# Patient Record
Sex: Female | Born: 1940 | Race: Black or African American | Hispanic: No | Smoking: Never smoker
Health system: Southern US, Community
[De-identification: ages and names within clinical notes are randomized; demographics above are authoritative.]

## PROBLEM LIST (undated history)

## (undated) ENCOUNTER — Emergency Department (HOSPITAL_COMMUNITY): Admission: EM | Discharge status: Absent without leave | Payer: Medicare Other

## (undated) DIAGNOSIS — G629 Polyneuropathy, unspecified: Secondary | ICD-10-CM

## (undated) DIAGNOSIS — K5900 Constipation, unspecified: Secondary | ICD-10-CM

## (undated) DIAGNOSIS — E119 Type 2 diabetes mellitus without complications: Secondary | ICD-10-CM

## (undated) DIAGNOSIS — I639 Cerebral infarction, unspecified: Secondary | ICD-10-CM

## (undated) DIAGNOSIS — K922 Gastrointestinal hemorrhage, unspecified: Secondary | ICD-10-CM

## (undated) DIAGNOSIS — K625 Hemorrhage of anus and rectum: Secondary | ICD-10-CM

## (undated) DIAGNOSIS — E7800 Pure hypercholesterolemia, unspecified: Secondary | ICD-10-CM

## (undated) DIAGNOSIS — K635 Polyp of colon: Secondary | ICD-10-CM

## (undated) DIAGNOSIS — K649 Unspecified hemorrhoids: Secondary | ICD-10-CM

## (undated) DIAGNOSIS — I10 Essential (primary) hypertension: Secondary | ICD-10-CM

## (undated) DIAGNOSIS — I739 Peripheral vascular disease, unspecified: Secondary | ICD-10-CM

## (undated) HISTORY — PX: ABDOMINAL HYSTERECTOMY: SHX81

## (undated) HISTORY — DX: Peripheral vascular disease, unspecified: I73.9

## (undated) HISTORY — DX: Polyneuropathy, unspecified: G62.9

## (undated) HISTORY — PX: COLONOSCOPY: SHX174

## (undated) HISTORY — DX: Polyp of colon: K63.5

---

## 2015-05-18 HISTORY — PX: ESOPHAGOGASTRODUODENOSCOPY: SHX1529

## 2016-05-29 ENCOUNTER — Encounter (HOSPITAL_COMMUNITY): Payer: Self-pay | Admitting: Emergency Medicine

## 2016-05-29 ENCOUNTER — Emergency Department (HOSPITAL_COMMUNITY)
Admission: EM | Admit: 2016-05-29 | Discharge: 2016-05-29 | Disposition: A | Discharge status: Home / Self Care | Payer: Medicare Other | Attending: Emergency Medicine | Admitting: Emergency Medicine

## 2016-05-29 ENCOUNTER — Emergency Department (HOSPITAL_COMMUNITY): Payer: Medicare Other

## 2016-05-29 DIAGNOSIS — Z79899 Other long term (current) drug therapy: Secondary | ICD-10-CM | POA: Insufficient documentation

## 2016-05-29 DIAGNOSIS — Z7982 Long term (current) use of aspirin: Secondary | ICD-10-CM | POA: Insufficient documentation

## 2016-05-29 DIAGNOSIS — E119 Type 2 diabetes mellitus without complications: Secondary | ICD-10-CM | POA: Insufficient documentation

## 2016-05-29 DIAGNOSIS — I10 Essential (primary) hypertension: Secondary | ICD-10-CM | POA: Insufficient documentation

## 2016-05-29 DIAGNOSIS — N390 Urinary tract infection, site not specified: Secondary | ICD-10-CM | POA: Diagnosis not present

## 2016-05-29 DIAGNOSIS — K625 Hemorrhage of anus and rectum: Secondary | ICD-10-CM

## 2016-05-29 DIAGNOSIS — N200 Calculus of kidney: Secondary | ICD-10-CM | POA: Insufficient documentation

## 2016-05-29 HISTORY — DX: Essential (primary) hypertension: I10

## 2016-05-29 HISTORY — DX: Gastrointestinal hemorrhage, unspecified: K92.2

## 2016-05-29 HISTORY — DX: Type 2 diabetes mellitus without complications: E11.9

## 2016-05-29 HISTORY — DX: Pure hypercholesterolemia, unspecified: E78.00

## 2016-05-29 LAB — CBC WITH DIFFERENTIAL/PLATELET
BASOS PCT: 0 %
Basophils Absolute: 0 10*3/uL (ref 0.0–0.1)
Basophils Absolute: 0 10*3/uL (ref 0.0–0.1)
Basophils Relative: 0 %
EOS ABS: 0.2 10*3/uL (ref 0.0–0.7)
EOS PCT: 2 %
Eosinophils Absolute: 0.2 10*3/uL (ref 0.0–0.7)
Eosinophils Relative: 2 %
HCT: 45.9 % (ref 36.0–46.0)
HEMATOCRIT: 46.9 % — AB (ref 36.0–46.0)
Hemoglobin: 14.8 g/dL (ref 12.0–15.0)
Hemoglobin: 15.3 g/dL — ABNORMAL HIGH (ref 12.0–15.0)
LYMPHS ABS: 2.5 10*3/uL (ref 0.7–4.0)
Lymphocytes Relative: 33 %
Lymphocytes Relative: 25 %
Lymphs Abs: 3.1 10*3/uL (ref 0.7–4.0)
MCH: 27.4 pg (ref 26.0–34.0)
MCH: 28.7 pg (ref 26.0–34.0)
MCHC: 31.6 g/dL (ref 30.0–36.0)
MCHC: 33.3 g/dL (ref 30.0–36.0)
MCV: 86.7 fL (ref 78.0–100.0)
MCV: 86.1 fL (ref 78.0–100.0)
MONO ABS: 1 10*3/uL (ref 0.1–1.0)
MONOS PCT: 11 %
Monocytes Absolute: 1.1 10*3/uL — ABNORMAL HIGH (ref 0.1–1.0)
Monocytes Relative: 11 %
NEUTROS ABS: 5 10*3/uL (ref 1.7–7.7)
Neutro Abs: 6.5 10*3/uL (ref 1.7–7.7)
Neutrophils Relative %: 54 %
Neutrophils Relative %: 62 %
PLATELETS: 257 10*3/uL (ref 150–400)
Platelets: 272 10*3/uL (ref 150–400)
RBC: 5.41 MIL/uL — ABNORMAL HIGH (ref 3.87–5.11)
RBC: 5.33 MIL/uL — AB (ref 3.87–5.11)
RDW: 14.5 % (ref 11.5–15.5)
RDW: 14.5 % (ref 11.5–15.5)
WBC: 9.3 10*3/uL (ref 4.0–10.5)
WBC: 10.3 10*3/uL (ref 4.0–10.5)

## 2016-05-29 LAB — URINALYSIS, ROUTINE W REFLEX MICROSCOPIC
BILIRUBIN URINE: NEGATIVE
GLUCOSE, UA: NEGATIVE mg/dL
HGB URINE DIPSTICK: NEGATIVE
Ketones, ur: NEGATIVE mg/dL
NITRITE: NEGATIVE
PROTEIN: 100 mg/dL — AB
Specific Gravity, Urine: 1.021 (ref 1.005–1.030)
pH: 5 (ref 5.0–8.0)

## 2016-05-29 LAB — DIFFERENTIAL
BASOS ABS: 0 10*3/uL (ref 0.0–0.1)
Basophils Relative: 0 %
EOS ABS: 0.1 10*3/uL (ref 0.0–0.7)
EOS PCT: 1 %
LYMPHS ABS: 2 10*3/uL (ref 0.7–4.0)
Lymphocytes Relative: 20 %
MONO ABS: 1 10*3/uL (ref 0.1–1.0)
MONOS PCT: 10 %
Neutro Abs: 6.6 10*3/uL (ref 1.7–7.7)
Neutrophils Relative %: 69 %

## 2016-05-29 LAB — COMPREHENSIVE METABOLIC PANEL
ALT: 22 U/L (ref 14–54)
AST: 29 U/L (ref 15–41)
Albumin: 3.8 g/dL (ref 3.5–5.0)
Alkaline Phosphatase: 85 U/L (ref 38–126)
Anion gap: 8 (ref 5–15)
BUN: 14 mg/dL (ref 6–20)
CHLORIDE: 101 mmol/L (ref 101–111)
CO2: 33 mmol/L — AB (ref 22–32)
Calcium: 9 mg/dL (ref 8.9–10.3)
Creatinine, Ser: 0.93 mg/dL (ref 0.44–1.00)
GFR, EST NON AFRICAN AMERICAN: 58 mL/min — AB (ref >60)
Glucose, Bld: 169 mg/dL — ABNORMAL HIGH (ref 65–99)
POTASSIUM: 3.6 mmol/L (ref 3.5–5.1)
SODIUM: 142 mmol/L (ref 135–145)
Total Bilirubin: 0.6 mg/dL (ref 0.3–1.2)
Total Protein: 7.8 g/dL (ref 6.5–8.1)

## 2016-05-29 LAB — CBC
HEMATOCRIT: 47.4 % — AB (ref 36.0–46.0)
Hemoglobin: 15.8 g/dL — ABNORMAL HIGH (ref 12.0–15.0)
MCH: 28.7 pg (ref 26.0–34.0)
MCHC: 33.3 g/dL (ref 30.0–36.0)
MCV: 86.2 fL (ref 78.0–100.0)
Platelets: 263 10*3/uL (ref 150–400)
RBC: 5.5 MIL/uL — ABNORMAL HIGH (ref 3.87–5.11)
RDW: 14.4 % (ref 11.5–15.5)
WBC: 9.9 10*3/uL (ref 4.0–10.5)

## 2016-05-29 LAB — LACTIC ACID, PLASMA
Lactic Acid, Venous: 0.8 mmol/L (ref 0.5–1.9)
Lactic Acid, Venous: 1 mmol/L (ref 0.5–1.9)

## 2016-05-29 LAB — POC OCCULT BLOOD, ED: Fecal Occult Bld: POSITIVE — AB

## 2016-05-29 MED ORDER — DEXTROSE 5 % IV SOLN
1.0000 g | Freq: Once | INTRAVENOUS | Status: AC
  Administered 2016-05-29: 1 g via INTRAVENOUS
  Filled 2016-05-29: qty 10

## 2016-05-29 MED ORDER — CEPHALEXIN 500 MG PO CAPS: 500.0000 mg | ORAL_CAPSULE | Freq: Four times a day (QID) | ORAL | 0 refills | Status: DC

## 2016-05-29 MED ORDER — IOPAMIDOL (ISOVUE-300) INJECTION 61%
100.0000 mL | Freq: Once | INTRAVENOUS | Status: AC | PRN
  Administered 2016-05-29: 100 mL via INTRAVENOUS

## 2016-05-29 NOTE — Progress Notes (Signed)
Patient ID: Sandra [name], female   DOB: Aug 10, 1940, 76 y.o.   MRN: [id number]   DISCUSSED WITH DR. Thurnell Garbe. ADMIT PT FOR UGIB. PLAN FOR EGD JAN 14. WILL OBTAIN OUTSIDE RECORDS. NPO AFTER MN EXCEPT SIPS WITH MEDS.

## 2016-05-29 NOTE — ED Provider Notes (Signed)
[facility] DEPT Provider Note   CSN: [id number] Arrival date & time: 05/29/16  0936     History   Chief Complaint Chief Complaint  Patient presents with  . Rectal Bleeding    HPI Sandra [name] is a 76 y.o. female.  HPI  Pt was seen at 1225. Per pt, c/o gradual onset and persistence of constant acute flair of her chronic rectal bleeding for the past 2 days. Pt states her "stools are black" and see "sees red blood" in the toilet after having a BM for the past 2 days. Pt states she has several year hx of same, been evaluated at [facility] ED and by GI Dr. Posey Pronto, "but they can't tell me what it is." Denies CP/SOB, no fevers, no rash, no back pain, no abd pain, no N/V.   Past Medical History:  Diagnosis Date  . Diabetes mellitus without complication ([place])   . GI bleed   . High cholesterol   . Hypertension     There are no active problems to display for this patient.   Past Surgical History:  Procedure Laterality Date  . ABDOMINAL HYSTERECTOMY      OB History    No data available       Home Medications    Prior to Admission medications   Medication Sig Start Date End Date Taking? Authorizing Provider  acetaminophen (TYLENOL) 650 MG CR tablet Take 650 mg by mouth every 8 (eight) hours as needed for pain.   Yes Historical Provider, MD  Ascorbic Acid (VITAMIN C) 1000 MG tablet Take 1,000 mg by mouth daily.   Yes Historical Provider, MD  aspirin EC 81 MG tablet Take 81 mg by mouth daily.   Yes Historical Provider, MD  esomeprazole (NEXIUM) 40 MG capsule Take 40 mg by mouth daily at 12 noon.   Yes Historical Provider, MD  LANTUS 100 UNIT/ML injection Inject 40 Units into the skin at bedtime. 03/10/16  Yes Historical Provider, MD  losartan (COZAAR) 100 MG tablet Take 1 tablet by mouth daily. 04/03/16  Yes Historical Provider, MD  metFORMIN (GLUCOPHAGE) 500 MG tablet Take 1 tablet by mouth 2 (two) times daily. 05/23/16  Yes Historical Provider, MD  Misc Natural Products  (DAILY HERBS LEG VEIN/CIRCULATI PO) Take 1 capsule by mouth daily.   Yes Historical Provider, MD  Multiple Vitamins-Minerals (ONE-A-DAY 50 PLUS PO) Take 1 tablet by mouth daily.   Yes Historical Provider, MD  Omega-3 Fatty Acids (FISH OIL) 1200 MG CAPS Take 1 capsule by mouth daily.   Yes Historical Provider, MD  simvastatin (ZOCOR) 20 MG tablet Take 1 tablet by mouth every evening. 05/03/16  Yes Historical Provider, MD  TOPROL XL 50 MG 24 hr tablet Take 1 tablet by mouth daily. 05/16/16  Yes Historical Provider, MD  vitamin B-12 (CYANOCOBALAMIN) 1000 MCG tablet Take 1,000 mcg by mouth daily.   Yes Historical Provider, MD    Family History History reviewed. No pertinent family history.  Social History Social History  Substance Use Topics  . Smoking status: Never Smoker  . Smokeless tobacco: Never Used  . Alcohol use No     Allergies   Valium [diazepam]   Review of Systems Review of Systems ROS: Statement: All systems negative except as marked or noted in the HPI; Constitutional: Negative for fever and chills. ; ; Eyes: Negative for eye pain, redness and discharge. ; ; ENMT: Negative for ear pain, hoarseness, nasal congestion, sinus pressure and sore throat. ; ; Cardiovascular: Negative for chest  pain, palpitations, diaphoresis, dyspnea and peripheral edema. ; ; Respiratory: Negative for cough, wheezing and stridor. ; ; Gastrointestinal: Negative for nausea, vomiting, diarrhea, abdominal pain, hematemesis, jaundice and +black stools, +rectal bleeding. . ; ; Genitourinary: Negative for dysuria, flank pain and hematuria. ; ; Musculoskeletal: Negative for back pain and neck pain. Negative for swelling and trauma.; ; Skin: Negative for pruritus, rash, abrasions, blisters, bruising and skin lesion.; ; Neuro: Negative for headache, lightheadedness and neck stiffness. Negative for weakness, altered level of consciousness, altered mental status, extremity weakness, paresthesias, involuntary  movement, seizure and syncope.       Physical Exam Updated Vital Signs BP 183/80 (BP Location: Left Arm)   Pulse 72   Temp 98.8 F (37.1 C) (Oral)   Resp 20   Ht 5\' 4"  (1.626 m)   Wt 217 lb (98.4 kg)   SpO2 94%   BMI 37.25 kg/m   16:59 Orthostatic Vital Signs DF  Orthostatic Lying   BP- Lying:  160/91  Pulse- Lying: 71      Orthostatic Sitting  BP- Sitting:  162/101  Pulse- Sitting: 74      Orthostatic Standing at 0 minutes  BP- Standing at 0 minutes: 147/69  Pulse- Standing at 0 minutes: 76     Physical Exam 1230: Physical examination:  Nursing notes reviewed; Vital signs and O2 SAT reviewed;  Constitutional: Well developed, Well nourished, Well hydrated, In no acute distress; Head:  Normocephalic, atraumatic; Eyes: EOMI, PERRL, No scleral icterus; ENMT: Mouth and pharynx normal, Mucous membranes moist; Neck: Supple, Full range of motion, No lymphadenopathy; Cardiovascular: Regular rate and rhythm, No gallop; Respiratory: Breath sounds clear & equal bilaterally, No wheezes.  Speaking full sentences with ease, Normal respiratory effort/excursion; Chest: Nontender, Movement normal; Abdomen: Soft, Nontender, Nondistended, Normal bowel sounds. Rectal exam performed w/permission of pt and ED RN chaperone present.  Anal tone normal.  Non-tender, soft black stool in rectal vault, heme positive.  No fissures, +external hemorrhoids without obvious active bleeding. No palp masses..;;; Genitourinary: No CVA tenderness; Extremities: Pulses normal, No tenderness, No edema, No calf edema or asymmetry.; Neuro: AA&Ox3, Major CN grossly intact.  Speech clear. No gross focal motor or sensory deficits in extremities.; Skin: Color normal, Warm, Dry.   ED Treatments / Results  Labs (all labs ordered are listed, but only abnormal results are displayed)   EKG  EKG Interpretation None       Radiology   Procedures Procedures (including critical care time)  Medications Ordered in  ED Medications - No data to display   Initial Impression / Assessment and Plan / ED Course  I have reviewed the triage vital signs and the nursing notes.  Pertinent labs & imaging results that were available during my care of the patient were reviewed by me and considered in my medical decision making (see chart for details).  MDM Reviewed: previous chart, nursing note and vitals Reviewed previous: labs Interpretation: labs and CT scan   Results for orders placed or performed during the hospital encounter of 05/29/16  Comprehensive metabolic panel  Result Value Ref Range   Sodium 142 135 - 145 mmol/L   Potassium 3.6 3.5 - 5.1 mmol/L   Chloride 101 101 - 111 mmol/L   CO2 33 (H) 22 - 32 mmol/L   Glucose, Bld 169 (H) 65 - 99 mg/dL   BUN 14 6 - 20 mg/dL   Creatinine, Ser 0.93 0.44 - 1.00 mg/dL   Calcium 9.0 8.9 - 10.3 mg/dL  Total Protein 7.8 6.5 - 8.1 g/dL   Albumin 3.8 3.5 - 5.0 g/dL   AST 29 15 - 41 U/L   ALT 22 14 - 54 U/L   Alkaline Phosphatase 85 38 - 126 U/L   Total Bilirubin 0.6 0.3 - 1.2 mg/dL   GFR calc non Af Amer 58 (L) >60 mL/min   GFR calc Af Amer >60 >60 mL/min   Anion gap 8 5 - 15  CBC  Result Value Ref Range   WBC 9.9 4.0 - 10.5 K/uL   RBC 5.50 (H) 3.87 - 5.11 MIL/uL   Hemoglobin 15.8 (H) 12.0 - 15.0 g/dL   HCT 47.4 (H) 36.0 - 46.0 %   MCV 86.2 78.0 - 100.0 fL   MCH 28.7 26.0 - 34.0 pg   MCHC 33.3 30.0 - 36.0 g/dL   RDW 14.4 11.5 - 15.5 %   Platelets 263 150 - 400 K/uL  Urinalysis, Routine w reflex microscopic  Result Value Ref Range   Color, Urine AMBER (A) YELLOW   APPearance CLOUDY (A) CLEAR   Specific Gravity, Urine 1.021 1.005 - 1.030   pH 5.0 5.0 - 8.0   Glucose, UA NEGATIVE NEGATIVE mg/dL   Hgb urine dipstick NEGATIVE NEGATIVE   Bilirubin Urine NEGATIVE NEGATIVE   Ketones, ur NEGATIVE NEGATIVE mg/dL   Protein, ur 100 (A) NEGATIVE mg/dL   Nitrite NEGATIVE NEGATIVE   Leukocytes, UA SMALL (A) NEGATIVE   RBC / HPF 6-30 0 - 5 RBC/hpf   WBC,  UA TOO NUMEROUS TO COUNT 0 - 5 WBC/hpf   Bacteria, UA MANY (A) NONE SEEN   Mucous PRESENT    Budding Yeast PRESENT    Hyaline Casts, UA PRESENT   Lactic acid, plasma  Result Value Ref Range   Lactic Acid, Venous 0.8 0.5 - 1.9 mmol/L  Lactic acid, plasma  Result Value Ref Range   Lactic Acid, Venous 1.0 0.5 - 1.9 mmol/L  Differential  Result Value Ref Range   Neutrophils Relative % 69 %   Neutro Abs 6.6 1.7 - 7.7 K/uL   Lymphocytes Relative 20 %   Lymphs Abs 2.0 0.7 - 4.0 K/uL   Monocytes Relative 10 %   Monocytes Absolute 1.0 0.1 - 1.0 K/uL   Eosinophils Relative 1 %   Eosinophils Absolute 0.1 0.0 - 0.7 K/uL   Basophils Relative 0 %   Basophils Absolute 0.0 0.0 - 0.1 K/uL  CBC with Differential  Result Value Ref Range   WBC 9.3 4.0 - 10.5 K/uL   RBC 5.41 (H) 3.87 - 5.11 MIL/uL   Hemoglobin 14.8 12.0 - 15.0 g/dL   HCT 46.9 (H) 36.0 - 46.0 %   MCV 86.7 78.0 - 100.0 fL   MCH 27.4 26.0 - 34.0 pg   MCHC 31.6 30.0 - 36.0 g/dL   RDW 14.5 11.5 - 15.5 %   Platelets 272 150 - 400 K/uL   Neutrophils Relative % 54 %   Neutro Abs 5.0 1.7 - 7.7 K/uL   Lymphocytes Relative 33 %   Lymphs Abs 3.1 0.7 - 4.0 K/uL   Monocytes Relative 11 %   Monocytes Absolute 1.0 0.1 - 1.0 K/uL   Eosinophils Relative 2 %   Eosinophils Absolute 0.2 0.0 - 0.7 K/uL   Basophils Relative 0 %   Basophils Absolute 0.0 0.0 - 0.1 K/uL  CBC with Differential  Result Value Ref Range   WBC 10.3 4.0 - 10.5 K/uL   RBC 5.33 (H) 3.87 - 5.11  MIL/uL   Hemoglobin 15.3 (H) 12.0 - 15.0 g/dL   HCT 45.9 36.0 - 46.0 %   MCV 86.1 78.0 - 100.0 fL   MCH 28.7 26.0 - 34.0 pg   MCHC 33.3 30.0 - 36.0 g/dL   RDW 14.5 11.5 - 15.5 %   Platelets 257 150 - 400 K/uL   Neutrophils Relative % 62 %   Neutro Abs 6.5 1.7 - 7.7 K/uL   Lymphocytes Relative 25 %   Lymphs Abs 2.5 0.7 - 4.0 K/uL   Monocytes Relative 11 %   Monocytes Absolute 1.1 (H) 0.1 - 1.0 K/uL   Eosinophils Relative 2 %   Eosinophils Absolute 0.2 0.0 - 0.7 K/uL    Basophils Relative 0 %   Basophils Absolute 0.0 0.0 - 0.1 K/uL  POC occult blood, ED  Result Value Ref Range   Fecal Occult Bld POSITIVE (A) NEGATIVE   Ct Abdomen Pelvis W Contrast Result Date: 05/29/2016 CLINICAL DATA:  Bright and dark red stools since yesterday. Hx of diabetes, HTN,GI bleed, hysterectomy EXAM: CT ABDOMEN AND PELVIS WITH CONTRAST TECHNIQUE: Multidetector CT imaging of the abdomen and pelvis was performed using the standard protocol following bolus administration of intravenous contrast. CONTRAST:  147mL ISOVUE-300 IOPAMIDOL (ISOVUE-300) INJECTION 61% COMPARISON:  None. FINDINGS: Lower chest: Lung bases are essentially clear. Heart is mildly enlarged. Hepatobiliary: No focal liver abnormality is seen. No gallstones, gallbladder wall thickening, or biliary dilatation. Pancreas: Unremarkable. No pancreatic ductal dilatation or surrounding inflammatory changes. Spleen: Normal in size without focal abnormality. Adrenals/Urinary Tract: No adrenal masses. 5.3 cm midpole left renal cyst. No other renal masses. Right renal scarring. Small calcification in the upper pole the right kidney. 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 Ureters normal in course and caliber. No ureteral stones. No hydronephrosis. Bladder is unremarkable. Stomach/Bowel: Stomach is within normal limits. Appendix appears normal. No evidence of bowel wall thickening, distention, or inflammatory changes. Vascular/Lymphatic: Aortic atherosclerosis. No enlarged abdominal or pelvic lymph nodes. Reproductive: Status post hysterectomy. No adnexal masses. Other: No abdominal wall hernia or abnormality. No abdominopelvic ascites. Musculoskeletal: No fracture or acute finding. No osteoblastic or osteolytic lesions. IMPRESSION: 1. No acute findings.  No findings to account for GI bleeding. 2. Right renal scar. Nonobstructing stone in the right kidney. Left renal cyst. 3. Aortic atherosclerosis.  Electronically Signed   By: Lajean Manes M.D.   On: 05/29/2016 15:33    1820:  Denies symptoms during orthostatic VS. Pt states she continues to have black stools/rectal bleeding while in the ED, but H/H x2 and VS remain stable. Pt is currently eating. WBC count and lactic acid x2 are normal. +UTI, UC pending; will dose IV rocephin. T/C to GI Dr. Oneida Alar, case discussed, including:  HPI, pertinent PM/SHx, VS/PE, dx testing, ED course and treatment:  Given pt's stability, likely not significant GI bleed at this time. T/C to [facility] Dr. Lorin Mercy, case discussed, including:  HPI, pertinent PM/SHx, VS/PE, dx testing, ED course and treatment:  Pt very stable, no clear criteria to admit at this time. Will re-check H/H, likely d/c. Dx and testing, as well as d/w MD's, d/w pt and family.  Questions answered.  Verb understanding, agreeable with plan.   2025:  3rd H/H stable. BP and HR stable. Has tol PO well without N/V. Abd remains benign. Dx and testing d/w pt and family.  Questions answered.  Verb understanding, agreeable to d/c home with outpt f/u.   Final Clinical Impressions(s) / ED Diagnoses  Final diagnoses:  None    New Prescriptions New Prescriptions   No medications on file     Francine Graven, DO 06/01/16 1719

## 2016-05-29 NOTE — ED Triage Notes (Signed)
Pt reports rectal bleeding since yesterday.  Describes as bright and dark red.

## 2016-05-29 NOTE — Discharge Instructions (Signed)
Take the prescription as directed.  Call your regular medical doctor and GI doctor on Monday to schedule a follow up appointment within the next 2 days.  Return to the Emergency Department immediately sooner if worsening.

## 2016-05-29 NOTE — ED Notes (Signed)
Patient would like something to eat. MD made aware.

## 2016-05-31 ENCOUNTER — Telehealth: Payer: Self-pay | Admitting: Nurse Practitioner

## 2016-05-31 NOTE — Telephone Encounter (Signed)
Pt seen in the ER and was told to follow up with [place]. Can we accept her as a new patient?

## 2016-06-04 NOTE — Telephone Encounter (Signed)
Ok to schedule for routine OV for ER follow-up/GI bleed"

## 2016-06-07 ENCOUNTER — Encounter: Payer: Self-pay | Admitting: Internal Medicine

## 2016-06-07 NOTE — Telephone Encounter (Signed)
OV made and letter mailed °

## 2016-06-16 ENCOUNTER — Ambulatory Visit (INDEPENDENT_AMBULATORY_CARE_PROVIDER_SITE_OTHER): Payer: Medicare Other | Admitting: Nurse Practitioner

## 2016-06-16 ENCOUNTER — Encounter: Payer: Self-pay | Admitting: Nurse Practitioner

## 2016-06-16 DIAGNOSIS — K5900 Constipation, unspecified: Secondary | ICD-10-CM | POA: Diagnosis not present

## 2016-06-16 DIAGNOSIS — K649 Unspecified hemorrhoids: Secondary | ICD-10-CM

## 2016-06-16 DIAGNOSIS — K625 Hemorrhage of anus and rectum: Secondary | ICD-10-CM | POA: Diagnosis not present

## 2016-06-16 MED ORDER — HYDROCORTISONE 2.5 % RE CREA: 1.0000 "application " | TOPICAL_CREAM | Freq: Two times a day (BID) | RECTAL | 1 refills | Status: DC

## 2016-06-16 NOTE — Patient Instructions (Signed)
1. Hold off on taking MiraLAX for now. 2. Start taking Amitiza 8 g, twice a day. We will give give you samples to last 2 weeks. 3. Call us in 1-2 weeks and let us know how the medicine is working. 4. We will request her previous colonoscopy from Dr. Posey Pronto. 5. I sent in Anusol rectal cream to use for your hemorrhoids. Apply this twice a day for up to 10 days at a time for hemorrhoid symptoms. 6. Return for follow-up in 2 months.

## 2016-06-16 NOTE — Assessment & Plan Note (Signed)
Chronic constipation associated with chronic rectal bleeding and hemorrhoid symptoms. Hemorrhoid management as per above. I will start her on Amitiza 8 g twice a day on a full stomach. I will provide samples for 2 weeks. She is to call us in 1-2 weeks with a progress report. We can titrate constipation medications over the phone for optimal management. Return for follow-up in 2 months.

## 2016-06-16 NOTE — Assessment & Plan Note (Signed)
The patient has a history of hemorrhoids, both internal and external, with hemorrhoid symptoms including rectal pain, irritation, fullness. This is associated with her rectal bleeding associated with her constipation. I will send in Anusol rectal cream for her to apply twice a day for 10 days at a time. This should help symptomatic management of her hemorrhoids. Return for follow-up in 2 months.

## 2016-06-16 NOTE — Assessment & Plan Note (Signed)
The patient has noted chronic rectal bleeding associated with her hemorrhoids. States she just had a colonoscopy last year with Dr. Posey Pronto. We will request these records. CT of her abdomen in the emergency department was normal, vital signs are stable, hemoglobin was stable for 3 draws. At this point her rectal bleeding is likely due to hemorrhoid leading in the setting of chronic constipation. Hemorrhoid treatment as per above. Constipation management as per below. We will have her return in 2 months and depending on her previous colonoscopy and her clinical progression we may elect to proceed with further colonoscopy.

## 2016-06-16 NOTE — Progress Notes (Addendum)
REVIEWED-NO ADDITIONAL RECOMMENDATIONS.  Referring Provider: No ref. provider found Primary Care Physician:  Thea Alken Primary GI:  Dr. Oneida Alar  Chief Complaint  Patient presents with  . GI bleed    no rectal bleeding now; not feeling good  . Nausea  . Abdominal Pain    all over  . Constipation    HPI:   Sandra [name] is a 76 y.o. female who presents for evaluation/ER follow-up related to rectal bleeding and melena. The patient was seen in the emergency department 05/29/2016 complaining of gradual onset and persistence of acute flare of chronic rectal bleeding for the previous 2 days with also "stools are black "and sees "red blood" in the toilet after a bowel movement. Several year history of the same, previously evaluated in the emergency department at [facility] and by Dr. Posey Pronto but, per the patient, they have been unable to tell her what the problem is. CT of the abdomen and pelvis found no acute findings and no findings to account for GI bleeding. Her vital signs and hemoglobin remained stable and was deemed not a likely significant GI bleed. Heme stool test found to be positive in the ED. Her hemoglobin along the emergency department ranged from 14.8-15.8. Her CMP was essentially normal.  Today she states she's doing ok overall. Confirms chronic rectal bleeding with acute exacerbatio of such prior to ER evaluation. Had black stools but was taking Pepto Bismol at the time. Has constipated and takes miraLAX; once a day dosing typically works, taking twice a day (as recommended) is too much and she overshoots and has diarrhea. Today she states she has been having intermittent generalized abdominal pain described as crampy, typically occurs 2-3 days out of a week. Worsened with eating anything with seeds, raw vegetables, and nuts. Cramping improves significantly with a bowel movement. Has a bowel movement about every day to twice a day; notes sensation of incomplete  emptying. Has hemorrhoid symptoms including rectal fullness and irritation. Last colonoscopy was about a year ago with Dr. Posey Pronto in [place], [place]. Previously saw another GI in [place], [place] who retired. She had one polyp removed and is due next year. Denies chest pain, dyspnea, dizziness, lightheadedness, syncope, near syncope. Denies any other upper or lower GI symptoms.  Past Medical History:  Diagnosis Date  . Colon polyp   . Diabetes mellitus without complication ([place] [place])   . GI bleed   . High cholesterol   . Hypertension     Past Surgical History:  Procedure Laterality Date  . ABDOMINAL HYSTERECTOMY      Current Outpatient Prescriptions  Medication Sig Dispense Refill  . acetaminophen (TYLENOL) 650 MG CR tablet Take 650 mg by mouth every 8 (eight) hours as needed for pain.    . Ascorbic Acid (VITAMIN C) 1000 MG tablet Take 1,000 mg by mouth daily.    Marland Kitchen aspirin EC 81 MG tablet Take 81 mg by mouth daily.    . cephALEXin (KEFLEX) 500 MG capsule Take 1 capsule (500 mg total) by mouth 4 (four) times daily. 40 capsule 0  . ergocalciferol (DRISDOL) 8000 UNIT/ML drops Take by mouth daily. Takes 2 drops daily    . esomeprazole (NEXIUM) 40 MG capsule Take 40 mg by mouth as needed.     Marland Kitchen LANTUS 100 UNIT/ML injection Inject 40 Units into the skin at bedtime.    Marland Kitchen losartan (COZAAR) 100 MG tablet Take 1 tablet by mouth daily.    . Misc Natural Products (DAILY HERBS LEG  VEIN/CIRCULATI PO) Take 1 capsule by mouth daily.    . Multiple Vitamins-Minerals (ONE-A-DAY 50 PLUS PO) Take 1 tablet by mouth daily.    . Omega-3 Fatty Acids (FISH OIL) 1200 MG CAPS Take 1 capsule by mouth daily.    . simvastatin (ZOCOR) 20 MG tablet Take 1 tablet by mouth every evening.    . TOPROL XL 50 MG 24 hr tablet Take 1 tablet by mouth daily.    . vitamin B-12 (CYANOCOBALAMIN) 1000 MCG tablet Take 1,000 mcg by mouth daily.     No current facility-administered medications for this visit.     Allergies as of 06/16/2016 -  Review Complete 06/16/2016  Allergen Reaction Noted  . Morphine and related  06/16/2016  . Valium [diazepam] Rash 05/29/2016    Family History  Problem Relation Age of Onset  . Colon cancer Neg Hx     Social History   Social History  . Marital status: Widowed    Spouse name: N/A  . Number of children: N/A  . Years of education: N/A   Social History Main Topics  . Smoking status: Never Smoker  . Smokeless tobacco: Never Used  . Alcohol use No  . Drug use: No  . Sexual activity: Not Asked   Other Topics Concern  . None   Social History Narrative  . None    Review of Systems: Complete ROS negative except as per HPI.   Physical Exam: BP (!) 198/97   Pulse 73   Temp 98.4 F (36.9 C) (Oral)   Ht 5' 4.5" (1.638 m)   Wt 216 lb 3.2 oz (98.1 kg)   BMI 36.54 kg/m  General:   Alert and oriented. Pleasant and cooperative. Well-nourished and well-developed.  Head:  Normocephalic and atraumatic. Eyes:  Without icterus, sclera clear and conjunctiva pink.  Ears:  Normal auditory acuity. Cardiovascular:  S1, S2 present without murmurs appreciated. Extremities without clubbing or edema. Respiratory:  Clear to auscultation bilaterally. No wheezes, rales, or rhonchi. No distress.  Gastrointestinal:  +BS, rounded but soft, non-tender and non-distended. No HSM noted. No guarding or rebound. No masses appreciated.  Rectal:  Deferred  Musculoskalatal:  Symmetrical without gross deformities. Neurologic:  Alert and oriented x4;  grossly normal neurologically. Psych:  Alert and cooperative. Normal mood and affect. Heme/Lymph/Immune: No excessive bruising noted.    06/16/2016 4:58 PM   Disclaimer: This note was dictated with voice recognition software. Similar sounding words can inadvertently be transcribed and may not be corrected upon review. ew

## 2016-06-21 NOTE — Progress Notes (Signed)
CC'D TO PCP °

## 2016-08-12 ENCOUNTER — Ambulatory Visit (INDEPENDENT_AMBULATORY_CARE_PROVIDER_SITE_OTHER): Payer: Medicare Other | Admitting: Nurse Practitioner

## 2016-08-12 ENCOUNTER — Encounter: Payer: Self-pay | Admitting: Nurse Practitioner

## 2016-08-12 VITALS — BP 199/97 | HR 70 | Temp 98.6°F | Ht 64.5 in | Wt 215.6 lb

## 2016-08-12 DIAGNOSIS — K5900 Constipation, unspecified: Secondary | ICD-10-CM

## 2016-08-12 DIAGNOSIS — K649 Unspecified hemorrhoids: Secondary | ICD-10-CM

## 2016-08-12 DIAGNOSIS — I10 Essential (primary) hypertension: Secondary | ICD-10-CM | POA: Insufficient documentation

## 2016-08-12 NOTE — Progress Notes (Signed)
Referring Provider: Thea Alken, FNP Primary Care Physician:  Thea Alken Primary GI:  Dr. Oneida Alar  Chief Complaint  Patient presents with  . Rectal Bleeding    less bleeding  . Constipation  . Hemorrhoids  . Abdominal Pain    across lower abd    HPI:   Sandra [name] is a 76 y.o. female who presents For follow-up on rectal bleeding, hemorrhoids, constipation, abdominal pain. She was last seen in our office 06/16/2016. That point she is doing well overall and constipation typically controlled with once a day MiraLAX with taking twice a day "too much "with resulting diarrhea. Abdominal pain is crampy in 2-3 times a week and improved significantly with a bowel movement. Hemorrhoid symptoms and due for colonoscopy in 2019. Previously saw another GI practitioner in [place], [place] but this person has retired. Recommended Amitiza 8 mg twice a day with samples for 2 weeks and request a progress report. States she just had a colonoscopy in 2017 and records were requested. Hemorrhoids were treated with topical treatments. Recommended two-month follow-up.  Previously a CT of the abdomen and pelvis found no acute findings and no findings to account for GI bleeding.  Today she states she's doing ok overall. Feels Miralax worked better than Amitiza. Having a bowel movement twice a day, sometimes has sensation of incomplete emptying. States she lost her brother last week. Also her 56 year old niece was diagnosed with stage IV cancer (stomach, liver, and one other place). All of this has caused stress and thinks this is why her BP is up today. Has abdominal pain which improves after a bowel movement, but this is improved. Hemorrhoids are improved with Anusol. Has only had 1 episode of rectal bleeding since last visit. States her symptoms are all tolerable at this point.  Past Medical History:  Diagnosis Date  . Colon polyp   . Diabetes mellitus without complication ([place])   . GI bleed    . High cholesterol   . Hypertension   . PAD (peripheral artery disease) ([place])   . Peripheral neuropathy [facility])     Past Surgical History:  Procedure Laterality Date  . ABDOMINAL HYSTERECTOMY      Current Outpatient Prescriptions  Medication Sig Dispense Refill  . acetaminophen (TYLENOL) 650 MG CR tablet Take 650 mg by mouth every 8 (eight) hours as needed for pain.    . Ascorbic Acid (VITAMIN C) 1000 MG tablet Take 1,000 mg by mouth daily.    Marland Kitchen aspirin EC 81 MG tablet Take 81 mg by mouth daily.    . ergocalciferol (DRISDOL) 8000 UNIT/ML drops Take by mouth daily. Takes 2 drops daily    . esomeprazole (NEXIUM) 40 MG capsule Take 40 mg by mouth as needed.     . hydrocortisone (ANUSOL-HC) 2.5 % rectal cream Place 1 application rectally 2 (two) times daily. 30 g 1  . LANTUS 100 UNIT/ML injection Inject 44 Units into the skin at bedtime.     Marland Kitchen losartan (COZAAR) 100 MG tablet Take 1 tablet by mouth daily.    . Misc Natural Products (DAILY HERBS LEG VEIN/CIRCULATI PO) Take 1 capsule by mouth daily.    . Multiple Vitamins-Minerals (ONE-A-DAY 50 PLUS PO) Take 1 tablet by mouth daily.    . Omega-3 Fatty Acids (FISH OIL) 1200 MG CAPS Take 1 capsule by mouth daily.    . simvastatin (ZOCOR) 20 MG tablet Take 1 tablet by mouth every evening.    . TOPROL XL  50 MG 24 hr tablet Take 1 tablet by mouth daily.    . vitamin B-12 (CYANOCOBALAMIN) 1000 MCG tablet Take 1,000 mcg by mouth daily.     No current facility-administered medications for this visit.     Allergies as of 08/12/2016 - Review Complete 08/12/2016  Allergen Reaction Noted  . Morphine and related  06/16/2016  . Valium [diazepam] Rash 05/29/2016    Family History  Problem Relation Age of Onset  . Colon cancer Neg Hx     Social History   Social History  . Marital status: Widowed    Spouse name: N/A  . Number of children: N/A  . Years of education: N/A   Social History Main Topics  . Smoking status: Never Smoker  .  Smokeless tobacco: Never Used  . Alcohol use No  . Drug use: No  . Sexual activity: Not Asked   Other Topics Concern  . None   Social History Narrative  . None    Review of Systems: Complete ROS negative except as per HPI.   Physical Exam: BP (!) 228/89   Pulse 70   Temp 98.6 F (37 C) (Oral)   Ht 5' 4.5" (1.638 m)   Wt 215 lb 9.6 oz (97.8 kg)   BMI 36.44 kg/m  General:   Alert and oriented. Pleasant and cooperative. Well-nourished and well-developed.  Head:  Normocephalic and atraumatic. Eyes:  Without icterus, sclera clear and conjunctiva pink.  Ears:  Normal auditory acuity. Cardiovascular:  S1, S2 present without murmurs appreciated. Extremities without clubbing or edema. Respiratory:  Clear to auscultation bilaterally. No wheezes, rales, or rhonchi. No distress.  Gastrointestinal:  +BS, rounded but soft, non-tender and non-distended. No HSM noted. No guarding or rebound. No masses appreciated.  Rectal:  Deferred  Musculoskalatal:  Symmetrical without gross deformities. Neurologic:  Alert and oriented x4;  grossly normal neurologically. Psych:  Alert and cooperative. Normal mood and affect. Heme/Lymph/Immune: No excessive bruising noted.    08/12/2016 10:09 AM   Disclaimer: This note was dictated with voice recognition software. Similar sounding words can inadvertently be transcribed and may not be corrected upon review.

## 2016-08-12 NOTE — Patient Instructions (Signed)
1. If you develop symptoms like weakness, significant dizziness, chest pain, worsening shortness of breath then proceed to the ER 2. At the very least, call your primary care doctor [place] to tell them about your high blood pressure 3. Add a fiber supplement once a day (such as fiber gummies) 4. Return for follow-up in 6 months 5. Call if your symptoms become severe or significantly worsen before then.

## 2016-08-12 NOTE — Assessment & Plan Note (Signed)
Documented history of hypertension. Her blood pressure today is 228/89. She denies cardiac symptoms such as chest pain, dyspnea, dizziness, weakness. Eyes given her ER precautions. I will have nursing recheck her blood pressure before she leaves. If she develops any symptoms she should proceed to the emergency room. At the very least she should call her primary care provider when she leaves our office today to discuss her high blood pressure. She does not feel she needs to go to the emergency room at this time.

## 2016-08-12 NOTE — Assessment & Plan Note (Signed)
Hemorrhoids with rectal bleeding. Has only had 1 episode of toilet tissue hematochezia since her last visit. Previous colonoscopy records received, she is due for another colonoscopy next year. Continue using Anusol which she feels is helped her symptoms significantly. If any worsening symptoms, call us. Otherwise, return for follow-up in 6 months.

## 2016-08-12 NOTE — Assessment & Plan Note (Signed)
Constipation with associated abdominal pain. States she is having one to 2 bowel movements a day on MiraLAX. Feels MiraLAX works better than Amitiza did. Overall she is satisfied with her symptoms at this time. She does have some abdominal tenderness on exam and admits to such occasionally, persistently. I will have her add a fiber supplement once a day. Return for follow-up in 6 months. Call if any worsening symptoms.

## 2016-08-16 NOTE — Progress Notes (Signed)
CC'D TO PCP °

## 2016-08-30 NOTE — Progress Notes (Signed)
REVIEWED-NO ADDITIONAL RECOMMENDATIONS. 

## 2016-11-04 ENCOUNTER — Encounter (HOSPITAL_COMMUNITY): Payer: Self-pay | Admitting: Emergency Medicine

## 2016-11-04 ENCOUNTER — Emergency Department (HOSPITAL_COMMUNITY)
Admission: EM | Admit: 2016-11-04 | Discharge: 2016-11-04 | Disposition: A | Discharge status: Home / Self Care | Payer: Medicare Other | Attending: Emergency Medicine | Admitting: Emergency Medicine

## 2016-11-04 DIAGNOSIS — E119 Type 2 diabetes mellitus without complications: Secondary | ICD-10-CM | POA: Insufficient documentation

## 2016-11-04 DIAGNOSIS — K625 Hemorrhage of anus and rectum: Secondary | ICD-10-CM | POA: Diagnosis present

## 2016-11-04 DIAGNOSIS — I10 Essential (primary) hypertension: Secondary | ICD-10-CM | POA: Insufficient documentation

## 2016-11-04 DIAGNOSIS — Z79899 Other long term (current) drug therapy: Secondary | ICD-10-CM | POA: Diagnosis not present

## 2016-11-04 DIAGNOSIS — Z7984 Long term (current) use of oral hypoglycemic drugs: Secondary | ICD-10-CM | POA: Insufficient documentation

## 2016-11-04 LAB — CBC
HCT: 44.2 % (ref 36.0–46.0)
Hemoglobin: 14.6 g/dL (ref 12.0–15.0)
MCH: 28.2 pg (ref 26.0–34.0)
MCHC: 33 g/dL (ref 30.0–36.0)
MCV: 85.3 fL (ref 78.0–100.0)
PLATELETS: 252 10*3/uL (ref 150–400)
RBC: 5.18 MIL/uL — ABNORMAL HIGH (ref 3.87–5.11)
RDW: 14.3 % (ref 11.5–15.5)
WBC: 8.6 10*3/uL (ref 4.0–10.5)

## 2016-11-04 LAB — DIFFERENTIAL
Basophils Absolute: 0 10*3/uL (ref 0.0–0.1)
Basophils Relative: 0 %
EOS PCT: 2 %
Eosinophils Absolute: 0.2 10*3/uL (ref 0.0–0.7)
LYMPHS ABS: 2.2 10*3/uL (ref 0.7–4.0)
LYMPHS PCT: 26 %
MONO ABS: 0.8 10*3/uL (ref 0.1–1.0)
MONOS PCT: 9 %
NEUTROS ABS: 5.4 10*3/uL (ref 1.7–7.7)
Neutrophils Relative %: 63 %

## 2016-11-04 LAB — COMPREHENSIVE METABOLIC PANEL
ALK PHOS: 80 U/L (ref 38–126)
ALT: 15 U/L (ref 14–54)
AST: 23 U/L (ref 15–41)
Albumin: 3.6 g/dL (ref 3.5–5.0)
Anion gap: 8 (ref 5–15)
BUN: 16 mg/dL (ref 6–20)
CALCIUM: 8.9 mg/dL (ref 8.9–10.3)
CHLORIDE: 104 mmol/L (ref 101–111)
CO2: 32 mmol/L (ref 22–32)
CREATININE: 0.78 mg/dL (ref 0.44–1.00)
GFR calc non Af Amer: >60 mL/min (ref >60)
Glucose, Bld: 82 mg/dL (ref 65–99)
Potassium: 3.5 mmol/L (ref 3.5–5.1)
SODIUM: 144 mmol/L (ref 135–145)
Total Bilirubin: 0.6 mg/dL (ref 0.3–1.2)
Total Protein: 7.1 g/dL (ref 6.5–8.1)

## 2016-11-04 LAB — TYPE AND SCREEN
ABO/RH(D): O POS
Antibody Screen: NEGATIVE

## 2016-11-04 LAB — POC OCCULT BLOOD, ED: FECAL OCCULT BLD: POSITIVE — AB

## 2016-11-04 MED ORDER — HYDROCORTISONE 2.5 % RE CREA: TOPICAL_CREAM | RECTAL | 1 refills | Status: DC

## 2016-11-04 NOTE — Discharge Instructions (Signed)
Blood work was completely normal. Prescription for rectal cream. Follow-up your primary care doctor or gastroenterologist.

## 2016-11-04 NOTE — ED Provider Notes (Signed)
[facility] DEPT Provider Note   CSN: [id number] Arrival date & time: 11/04/16  0904     History   Chief Complaint Chief Complaint  Patient presents with  . Rectal Bleeding    HPI Sandra [name] is a 76 y.o. female.  Patient has had several minimal episodes of blood from her rectum for the past 2 days. This is been ongoing, for 7 years. He is been seen by gastroenterologist. Colonoscopy and CT abdomen/pelvis from the past year were normal. No fever, sweats, chills, vomiting.  Patient is been able to eat. Severity of symptoms is mild.      Past Medical History:  Diagnosis Date  . Colon polyp   . Diabetes mellitus without complication ([place])   . GI bleed   . High cholesterol   . Hypertension   . PAD (peripheral artery disease) ([place])   . Peripheral neuropathy     Patient Active Problem List   Diagnosis Date Noted  . Essential hypertension 08/12/2016  . Hemorrhoids 06/16/2016  . Rectal bleeding 06/16/2016  . Constipation 06/16/2016    Past Surgical History:  Procedure Laterality Date  . ABDOMINAL HYSTERECTOMY      OB History    No data available       Home Medications    Prior to Admission medications   Medication Sig Start Date End Date Taking? Authorizing Provider  acetaminophen (TYLENOL) 650 MG CR tablet Take 650 mg by mouth every 8 (eight) hours as needed for pain.   Yes [provider]  Ascorbic Acid (VITAMIN C) 1000 MG tablet Take 1,000 mg by mouth daily.   Yes [provider]  aspirin EC 81 MG tablet Take 81 mg by mouth daily.   Yes [provider]  ergocalciferol (DRISDOL) 8000 UNIT/ML drops Take by mouth daily. Takes 2 drops daily   Yes [provider]  esomeprazole (NEXIUM) 40 MG capsule Take 40 mg by mouth as needed.    Yes [provider]  LANTUS 100 UNIT/ML injection Inject 44 Units into the skin at bedtime.  03/10/16  Yes [provider]  losartan (COZAAR) 100 MG tablet Take 1 tablet by  mouth daily. 04/03/16  Yes [provider]  metFORMIN (GLUCOPHAGE) 500 MG tablet Take 1 tablet by mouth 2 (two) times daily after a meal. 09/28/16  Yes [provider]  Multiple Vitamins-Minerals (ONE-A-DAY 50 PLUS PO) Take 1 tablet by mouth daily.   Yes [provider]  Omega-3 Fatty Acids (FISH OIL) 1200 MG CAPS Take 1 capsule by mouth daily.   Yes [provider]  polyethylene glycol powder (GLYCOLAX/MIRALAX) powder Take 1 Container by mouth daily.   Yes [provider]  simvastatin (ZOCOR) 20 MG tablet Take 1 tablet by mouth every evening. 05/03/16  Yes [provider]  TOPROL XL 50 MG 24 hr tablet Take 1 tablet by mouth daily. 05/16/16  Yes [provider]  vitamin B-12 (CYANOCOBALAMIN) 1000 MCG tablet Take 1,000 mcg by mouth daily.   Yes [provider]  hydrocortisone (ANUSOL-HC) 2.5 % rectal cream Apply rectally 2 times daily 11/04/16   [name], MD    Family History Family History  Problem Relation Age of Onset  . Colon cancer Neg Hx     Social History Social History  Substance Use Topics  . Smoking status: Never Smoker  . Smokeless tobacco: Never Used  . Alcohol use No     Allergies   Morphine and related and Valium [diazepam]  Review of Systems Review of Systems  All other systems reviewed and are negative.    Physical Exam Updated Vital Signs BP 127/69   Pulse 66   Temp 98.2 F (36.8 C) (Oral)   Resp 19   Ht 5\' 4"  (1.626 m)   Wt 98.9 kg (218 lb)   SpO2 92%   BMI 37.42 kg/m   Physical Exam  Constitutional: She is oriented to person, place, and time. She appears well-developed and well-nourished.  Obese, no acute distress  HENT:  Head: Normocephalic and atraumatic.  Eyes: Conjunctivae are normal.  Neck: Neck supple.  Cardiovascular: Normal rate and regular rhythm.   Pulmonary/Chest: Effort normal and breath sounds normal.  Abdominal: Soft. Bowel sounds are normal.    Genitourinary:  Genitourinary Comments: Rectal exam: External hemorrhoids; no masses; brown stool; weakly heme positive  Musculoskeletal: Normal range of motion.  Neurological: She is alert and oriented to person, place, and time.  Skin: Skin is warm and dry.  Psychiatric: She has a normal mood and affect. Her behavior is normal.  Nursing note and vitals reviewed.    ED Treatments / Results  Labs (all labs ordered are listed, but only abnormal results are displayed) Labs Reviewed  CBC - Abnormal; Notable for the following:       Result Value   RBC 5.18 (*)    All other components within normal limits  POC OCCULT BLOOD, ED - Abnormal; Notable for the following:    Fecal Occult Bld POSITIVE (*)    All other components within normal limits  COMPREHENSIVE METABOLIC PANEL  DIFFERENTIAL  CBC WITH DIFFERENTIAL/PLATELET  TYPE AND SCREEN    EKG  EKG Interpretation None       Radiology No results found.  Procedures Procedures (including critical care time)  Medications Ordered in ED Medications - No data to display   Initial Impression / Assessment and Plan / ED Course  I have reviewed the triage vital signs and the nursing notes.  Pertinent labs & imaging results that were available during my care of the patient were reviewed by me and considered in my medical decision making (see chart for details).     Patient has an ongoing problem for 7 years. She is hemodynamically stable. Hemoglobin is normal. Rectal exam weakly heme positive. Will encourage GI follow-up.  Final Clinical Impressions(s) / ED Diagnoses   Final diagnoses:  Rectal bleeding    New Prescriptions New Prescriptions   HYDROCORTISONE (ANUSOL-HC) 2.5 % RECTAL CREAM    Apply rectally 2 times daily     [name], MD 11/04/16 1243

## 2016-11-04 NOTE — ED Triage Notes (Addendum)
Patient complaining of rectal bleeding and abdominal pain x 2 days. States this has been ongoing x 7 years. Also complaining of generalized weakness.

## 2016-11-19 ENCOUNTER — Encounter: Payer: Self-pay | Admitting: Nurse Practitioner

## 2016-11-19 ENCOUNTER — Ambulatory Visit (INDEPENDENT_AMBULATORY_CARE_PROVIDER_SITE_OTHER): Payer: Medicare Other | Admitting: Nurse Practitioner

## 2016-11-19 VITALS — BP 183/93 | HR 81 | Temp 97.9°F | Ht 64.0 in | Wt 212.4 lb

## 2016-11-19 DIAGNOSIS — K5900 Constipation, unspecified: Secondary | ICD-10-CM | POA: Diagnosis not present

## 2016-11-19 DIAGNOSIS — K625 Hemorrhage of anus and rectum: Secondary | ICD-10-CM

## 2016-11-19 DIAGNOSIS — K649 Unspecified hemorrhoids: Secondary | ICD-10-CM | POA: Diagnosis not present

## 2016-11-19 DIAGNOSIS — R1310 Dysphagia, unspecified: Secondary | ICD-10-CM | POA: Diagnosis not present

## 2016-11-19 NOTE — Assessment & Plan Note (Signed)
Intermittent rectal bleeding/spotting with known hemorrhoids. Stool weakly positive for blood in the emergency department. Colonoscopy up-to-date. We will request her previous colonoscopy records to review and determine when she is next due for colonoscopy, although she was told in 2019. Continue current medications and they seem to be helping. Return for follow-up and of September as already scheduled.

## 2016-11-19 NOTE — Progress Notes (Signed)
Previous colonoscopy and upper endoscopy reports received. Colonoscopy was completed 10/16/2015 which found fair to poor prep focally, two 4-6 mm polyps in the ascending colon and transverse colon status post resection, tortuous colon. Recommended high-fiber diet and repeat colonoscopy in 2 years. Surgical pathology found the polyps to be tubular adenoma.  Last colonoscopy completed 01/01/2016 which found questionable mild stricture of the GE junction with nodularity status post dilation and biopsy. 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  Records will be marked for scanning into the system.

## 2016-11-19 NOTE — Assessment & Plan Note (Signed)
The patient has no next internal hemorrhoids. She was seen in the emergency department for rectal bleeding which is deemed to be mild in nature. Her stool was weakly heme positive. Anusol helps her bleeding but does not resolve it. Colonoscopy up-to-date in 2017, although she states she was told she is due for repeat exam in 2019. We will again request records from her previous GI. I discussed options for external hemorrhoids which are generally limited to topical therapy versus surgical resection. Pending her previous colonoscopy report and symptom progression we will decide on when she should next have a colonoscopy. Continue current medications otherwise. Return for follow-up end of September as already scheduled.

## 2016-11-19 NOTE — Assessment & Plan Note (Addendum)
The patient has very rare dysphagia symptoms, about once every few months with no regurgitation. She admits her dentures or broken and need to be fixed. Her symptoms are likely due to poor chewing given broken dentures which she is unable to use. While there may be an indication for upper endoscopy I would like to have her dentures fixed to see if this makes any improvement on her symptoms. If she continues to have dysphagia, even intermittently, with functional dentures; consider upper endoscopy. Return for follow-up end of September as already scheduled.

## 2016-11-19 NOTE — Assessment & Plan Note (Signed)
Constipation significantly improved with fiber supplementation as well as increased dietary fiber. No longer requiring MiraLAX. Having 2 bowel movements a day. Continue to monitor, return for follow-up as already scheduled.

## 2016-11-19 NOTE — Progress Notes (Signed)
Referring Provider: Thea Alken, FNP Primary Care Physician:  Thea Alken Primary GI:  Dr. Oneida Alar  Chief Complaint  Patient presents with  . Rectal Bleeding    still having some but better, went to ER 11/04/16    HPI:   Sandra [name] is a 76 y.o. female who presents For follow-up on rectal bleeding. The patient was last seen in our office 08/12/2016 for constipation, hemorrhoids, and noted elevated blood pressure during her visit. At her previous visit it was noted a CT of the abdomen and pelvis have been completed upon previous GI bleed with no findings to account for her bleeding. Last colonoscopy 2017 with records requested. Hemorrhoids with topical treatment. At her last visit she was doing well overall, MiraLAX worked better than Amitiza with twice daily bowel movement although sometimes has sensation of incomplete emptying. Under significant stress which has caused her blood pressure to be intermittently poorly controlled. Abdominal pain improved after bowel movement. Hemorrhoids improved with Anusol with only 1 episode of rectal bleeding since her last visit. Symptoms overall tolerable. Recommended notify primary care of elevated blood pressure, and fiber supplement, ER precautions given for chest pain/htn, return for follow-up in 6 months.  She did present to the emergency department on 11/04/2016 also for rectal bleeding. At that time she noted several minimal episodes of blood in her rectum for the previous 2 days which is been ongoing for 7 years. Rectal exam revealed external hemorrhoids without masses, brown stool, weakly heme positive. CBC essentially normal. Recommended GI follow-up.  Today she states she's not doing so great. Still having rectal bleeding, Anusol helps but not resolved. Sometimes has spotting, sometimes more significant amount. Doesn't see bleeding with every bowel movement. Thinks her rectal bleeding in the ER was set-up by eating cole slaw. Sees  bleeding for a couple-few days in a row and will typically stop occasionally until another occurrence. Has some weakness in the morning. States she hasn't been right since her last colonoscopy. "I feel like sometimes my electrolytes are off, I have ringing in my ears, everything is wrong." Denies abdominal pain. Has occasional nausea in the morning, no vomiting. Constipation is doing well, twice daily bowel movements without needing MiraLAX. Takes fiber supplements and has increased dietary fiber as well. Has an episode of slow esophageal transit every few months, no regurgitation; worse with cole slaw and raw foods. Is preparing to have her teeth fixed (her dentures broke). Denies chest pain, dyspnea, dizziness, lightheadedness, syncope, near syncope. Denies any other upper or lower GI symptoms.  She states at her last colonoscopy she was told the prep was poor but two polyps were removed without being able to see. He was told she needed a repeat exam in 2019. Records not received as of yet.  Past Medical History:  Diagnosis Date  . Colon polyp   . Diabetes mellitus without complication ([place])   . GI bleed   . High cholesterol   . Hypertension   . PAD (peripheral artery disease) ([place])   . Peripheral neuropathy     Past Surgical History:  Procedure Laterality Date  . ABDOMINAL HYSTERECTOMY      Current Outpatient Prescriptions  Medication Sig Dispense Refill  . acetaminophen (TYLENOL) 650 MG CR tablet Take 650 mg by mouth every 8 (eight) hours as needed for pain.    . Ascorbic Acid (VITAMIN C) 1000 MG tablet Take 1,000 mg by mouth daily.    Marland Kitchen aspirin EC 81 MG tablet  Take 81 mg by mouth daily.    . ergocalciferol (DRISDOL) 8000 UNIT/ML drops Take by mouth daily. Takes 2 drops daily    . esomeprazole (NEXIUM) 40 MG capsule Take 40 mg by mouth as needed.     . hydrocortisone (ANUSOL-HC) 2.5 % rectal cream Apply rectally 2 times daily 28.35 g 1  . LANTUS 100 UNIT/ML injection Inject 44 Units  into the skin at bedtime.     Marland Kitchen losartan (COZAAR) 100 MG tablet Take 1 tablet by mouth daily.    . metFORMIN (GLUCOPHAGE) 500 MG tablet Take 1 tablet by mouth 2 (two) times daily after a meal. Hasn't taken in 1 week    . Multiple Vitamins-Minerals (ONE-A-DAY 50 PLUS PO) Take 1 tablet by mouth daily.    . Omega-3 Fatty Acids (FISH OIL) 1200 MG CAPS Take 1 capsule by mouth daily.    . polyethylene glycol powder (GLYCOLAX/MIRALAX) powder Take 1 Container by mouth as needed.     . simvastatin (ZOCOR) 20 MG tablet Take 1 tablet by mouth every evening.    . TOPROL XL 50 MG 24 hr tablet Take 1 tablet by mouth daily.    . vitamin B-12 (CYANOCOBALAMIN) 1000 MCG tablet Take 1,000 mcg by mouth daily.     No current facility-administered medications for this visit.     Allergies as of 11/19/2016 - Review Complete 11/19/2016  Allergen Reaction Noted  . Morphine and related  06/16/2016  . Valium [diazepam] Rash 05/29/2016    Family History  Problem Relation Age of Onset  . Colon cancer Neg Hx     Social History   Social History  . Marital status: Widowed    Spouse name: N/A  . Number of children: N/A  . Years of education: N/A   Social History Main Topics  . Smoking status: Never Smoker  . Smokeless tobacco: Never Used  . Alcohol use No  . Drug use: No  . Sexual activity: Not Asked   Other Topics Concern  . None   Social History Narrative  . None    Review of Systems: Complete ROS negative except as per HPI.   Physical Exam: BP (!) 183/93   Pulse 81   Temp 97.9 F (36.6 C) (Oral)   Ht 5\' 4"  (1.626 m)   Wt 212 lb 6.4 oz (96.3 kg)   BMI 36.46 kg/m  General:   Obese female. Alert and oriented. Pleasant and cooperative. Well-nourished and well-developed.  Eyes:  Without icterus, sclera clear and conjunctiva pink.  Ears:  Normal auditory acuity. Cardiovascular:  S1, S2 present without murmurs appreciated. Extremities without clubbing or edema. Respiratory:  Clear to  auscultation bilaterally. No wheezes, rales, or rhonchi. No distress.  Gastrointestinal:  +BS, soft, and non-distended. Mild discomfort to palpation. No HSM noted. No guarding or rebound. No masses appreciated.  Rectal:  Deferred  Musculoskalatal:  Symmetrical without gross deformities. Neurologic:  Alert and oriented x4;  grossly normal neurologically. Psych:  Alert and cooperative. Normal mood and affect. Heme/Lymph/Immune: No excessive bruising noted.    11/19/2016 10:56 AM   Disclaimer: This note was dictated with voice recognition software. Similar sounding words can inadvertently be transcribed and may not be corrected upon review.

## 2016-11-19 NOTE — Patient Instructions (Signed)
1. Continue taking your current medications, including rectal cream for hemorrhoids. 2. We will request your previous colonoscopy and upper endoscopy reports from Dr. Posey Pronto in [place], [place]. 3. Return for follow-up in the end of September, as already scheduled. 4. Call if you have any worsening or severe symptoms before then.

## 2016-11-21 NOTE — Progress Notes (Signed)
REVIEWED. PT NEEDS TCS BEFORE END OF 2018. 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

## 2016-11-22 NOTE — Progress Notes (Signed)
cc'ed to pcp °

## 2016-11-29 ENCOUNTER — Emergency Department (HOSPITAL_COMMUNITY)
Admission: EM | Admit: 2016-11-29 | Discharge: 2016-11-29 | Disposition: A | Discharge status: Home / Self Care | Payer: Medicare Other | Attending: Emergency Medicine | Admitting: Emergency Medicine

## 2016-11-29 ENCOUNTER — Encounter (HOSPITAL_COMMUNITY): Payer: Self-pay

## 2016-11-29 ENCOUNTER — Emergency Department (HOSPITAL_COMMUNITY): Payer: Medicare Other

## 2016-11-29 DIAGNOSIS — R51 Headache: Secondary | ICD-10-CM | POA: Insufficient documentation

## 2016-11-29 DIAGNOSIS — M6281 Muscle weakness (generalized): Secondary | ICD-10-CM | POA: Insufficient documentation

## 2016-11-29 DIAGNOSIS — Z79899 Other long term (current) drug therapy: Secondary | ICD-10-CM | POA: Insufficient documentation

## 2016-11-29 DIAGNOSIS — Z8673 Personal history of transient ischemic attack (TIA), and cerebral infarction without residual deficits: Secondary | ICD-10-CM | POA: Insufficient documentation

## 2016-11-29 DIAGNOSIS — I10 Essential (primary) hypertension: Secondary | ICD-10-CM | POA: Insufficient documentation

## 2016-11-29 DIAGNOSIS — Z794 Long term (current) use of insulin: Secondary | ICD-10-CM | POA: Insufficient documentation

## 2016-11-29 DIAGNOSIS — Z7984 Long term (current) use of oral hypoglycemic drugs: Secondary | ICD-10-CM | POA: Diagnosis not present

## 2016-11-29 DIAGNOSIS — E119 Type 2 diabetes mellitus without complications: Secondary | ICD-10-CM | POA: Insufficient documentation

## 2016-11-29 DIAGNOSIS — Z7982 Long term (current) use of aspirin: Secondary | ICD-10-CM | POA: Insufficient documentation

## 2016-11-29 DIAGNOSIS — R531 Weakness: Secondary | ICD-10-CM

## 2016-11-29 LAB — BASIC METABOLIC PANEL
Anion gap: 6 (ref 5–15)
BUN: 18 mg/dL (ref 6–20)
CO2: 34 mmol/L — ABNORMAL HIGH (ref 22–32)
Calcium: 9 mg/dL (ref 8.9–10.3)
Chloride: 104 mmol/L (ref 101–111)
Creatinine, Ser: 0.76 mg/dL (ref 0.44–1.00)
GFR calc non Af Amer: >60 mL/min (ref >60)
Glucose, Bld: 73 mg/dL (ref 65–99)
POTASSIUM: 3.9 mmol/L (ref 3.5–5.1)
SODIUM: 144 mmol/L (ref 135–145)

## 2016-11-29 LAB — URINALYSIS, ROUTINE W REFLEX MICROSCOPIC
BACTERIA UA: NONE SEEN
BILIRUBIN URINE: NEGATIVE
GLUCOSE, UA: NEGATIVE mg/dL
Hgb urine dipstick: NEGATIVE
KETONES UR: NEGATIVE mg/dL
LEUKOCYTES UA: NEGATIVE
NITRITE: NEGATIVE
PH: 8 (ref 5.0–8.0)
Protein, ur: 30 mg/dL — AB
Specific Gravity, Urine: 1.006 (ref 1.005–1.030)
WBC, UA: NONE SEEN WBC/hpf (ref 0–5)

## 2016-11-29 LAB — CBC
HEMATOCRIT: 43.8 % (ref 36.0–46.0)
Hemoglobin: 14.5 g/dL (ref 12.0–15.0)
MCH: 28.4 pg (ref 26.0–34.0)
MCHC: 33.1 g/dL (ref 30.0–36.0)
MCV: 85.9 fL (ref 78.0–100.0)
PLATELETS: 261 10*3/uL (ref 150–400)
RBC: 5.1 MIL/uL (ref 3.87–5.11)
RDW: 14.4 % (ref 11.5–15.5)
WBC: 8.9 10*3/uL (ref 4.0–10.5)

## 2016-11-29 NOTE — ED Notes (Signed)
Pt hasn't taken bp medication.

## 2016-11-29 NOTE — Discharge Instructions (Signed)
Today's workup without any significant abnormalities. No evidence of new stroke. Make an appointment to follow-up with your regular doctor. Return for any new or worse symptoms.

## 2016-11-29 NOTE — ED Notes (Signed)
Pt states understanding of care given and follow up instructions.  Pt a/o ambulated from bed to wheelchair.  Taken to vehicle in wheelchair, ambulated from wheelchair to vehicle with no assistance.  Pt to be transported home by female family member.

## 2016-11-29 NOTE — ED Triage Notes (Signed)
Reports of intermittent headaches with generalized weakness x3 days. Also reports of nausea. States she has felt confused as well for 3 days.

## 2016-11-29 NOTE — ED Provider Notes (Signed)
Sandra [name] DEPT Provider Note   CSN: [id number] Arrival date & time: 11/29/16  1109     History   Chief Complaint Chief Complaint  Patient presents with  . Weakness  . Headache    HPI Sandra [name] is a 76 y.o. female.  Patient presents with concerns for right hand weakness started on Friday. 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 But had increased weakness of the right hand starting on Friday. Also associated with headaches to the posterior part of the head that a been intermittent lasting 5-10 minutes. Patient's had long-standing complaint of nausea in the morning for the past 6 months. Patient's primary care doctor is in the [place] area. Patient is concerned about new stroke.      Past Medical History:  Diagnosis Date  . Colon polyp   . Diabetes mellitus without complication ([place])   . GI bleed   . High cholesterol   . Hypertension   . PAD (peripheral artery disease) ([place])   . Peripheral neuropathy     Patient Active Problem List   Diagnosis Date Noted  . Dysphagia 11/19/2016  . Essential hypertension 08/12/2016  . Hemorrhoids 06/16/2016  . Rectal bleeding 06/16/2016  . Constipation 06/16/2016    Past Surgical History:  Procedure Laterality Date  . ABDOMINAL HYSTERECTOMY      OB History    No data available       Home Medications    Prior to Admission medications   Medication Sig Start Date End Date Taking? Authorizing Provider  Ascorbic Acid (VITAMIN C) 1000 MG tablet Take 1,000 mg by mouth daily.   Yes [provider]  aspirin EC 81 MG tablet Take 81 mg by mouth daily.   Yes [provider]  ergocalciferol (DRISDOL) 8000 UNIT/ML drops Take by mouth daily. Takes 2 drops daily   Yes [provider]  esomeprazole (NEXIUM) 40 MG capsule Take 40 mg by mouth as needed.    Yes [provider]  hydrocortisone (ANUSOL-HC) 2.5 % rectal cream Apply rectally 2 times  daily Patient taking differently: Place 1 application rectally as needed for hemorrhoids. Apply rectally 2 times daily 11/04/16  Yes [name], MD  LANTUS 100 UNIT/ML injection Inject 44 Units into the skin at bedtime.  03/10/16  Yes [provider]  losartan (COZAAR) 100 MG tablet Take 1 tablet by mouth daily. 04/03/16  Yes [provider]  metFORMIN (GLUCOPHAGE) 500 MG tablet Take 1 tablet by mouth 2 (two) times daily after a meal. Hasn't taken in 1 week 09/28/16  Yes [provider]  Multiple Vitamins-Minerals (ONE-A-DAY 50 PLUS PO) Take 1 tablet by mouth daily.   Yes [provider]  Omega-3 Fatty Acids (FISH OIL) 1200 MG CAPS Take 1 capsule by mouth daily.   Yes [provider]  polyethylene glycol powder (GLYCOLAX/MIRALAX) powder Take 1 Container by mouth as needed.    Yes [provider]  simvastatin (ZOCOR) 20 MG tablet Take 1 tablet by mouth every evening. 05/03/16  Yes [provider]  TOPROL XL 50 MG 24 hr tablet Take 1 tablet by mouth daily. 05/16/16  Yes [provider]  vitamin B-12 (CYANOCOBALAMIN) 1000 MCG tablet Take 1,000 mcg by mouth daily.   Yes [provider]    Family History Family History  Problem Relation Age of Onset  . Colon cancer Neg Hx     Social History Social History  Substance Use Topics  .  Smoking status: Never Smoker  . Smokeless tobacco: Never Used  . Alcohol use No     Allergies   Hctz [hydrochlorothiazide]; Morphine and related; and Valium [diazepam]   Review of Systems Review of Systems  Constitutional: Positive for appetite change. Negative for fever.  HENT: Negative for congestion.   Eyes: Negative for visual disturbance.  Respiratory: Negative for shortness of breath.   Cardiovascular: Negative for chest pain.  Gastrointestinal: Positive for nausea. Negative for abdominal pain.  Genitourinary: Negative for dysuria.  Musculoskeletal: Negative for back  pain.  Skin: Negative for rash.  Neurological: Positive for weakness, numbness and headaches.  Hematological: Does not bruise/bleed easily.  Psychiatric/Behavioral: Negative for confusion.     Physical Exam Updated Vital Signs BP (!) 182/74   Pulse 67   Temp 98.1 F (36.7 C) (Oral)   Resp 18   SpO2 93%   Physical Exam  Constitutional: She is oriented to person, place, and time. She appears well-developed and well-nourished. No distress.  HENT:  Head: Normocephalic and atraumatic.  Mouth/Throat: Oropharynx is clear and moist.  Eyes: Pupils are equal, round, and reactive to light. Conjunctivae and EOM are normal.  Neck: Normal range of motion. Neck supple.  Cardiovascular: Normal rate and regular rhythm.   Pulmonary/Chest: Effort normal and breath sounds normal.  Abdominal: Bowel sounds are normal. There is no tenderness.  Musculoskeletal: Normal range of motion.  Neurological: She is alert and oriented to person, place, and time. No cranial nerve deficit.  A very slight motor weakness noted to the left side lower extremity and upper extremity. Coordination with her right hand grossly intact.  Skin: Skin is warm. Capillary refill takes less than 2 seconds.  Nursing note and vitals reviewed.    ED Treatments / Results  Labs (all labs ordered are listed, but only abnormal results are displayed) Labs Reviewed  BASIC METABOLIC PANEL - Abnormal; Notable for the following:       Result Value   CO2 34 (*)    All other components within normal limits  URINALYSIS, ROUTINE W REFLEX MICROSCOPIC - Abnormal; Notable for the following:    Color, Urine STRAW (*)    Protein, ur 30 (*)    Squamous Epithelial / LPF 0-5 (*)    All other components within normal limits  CBC    EKG  EKG Interpretation None       Radiology Ct Head Wo Contrast  Result Date: 11/29/2016 CLINICAL DATA:  Headache with right sided weakness. Nausea. Confusion. EXAM: CT HEAD WITHOUT CONTRAST TECHNIQUE:  Contiguous axial images were obtained from the base of the skull through the vertex without intravenous contrast. COMPARISON:  None. FINDINGS: Brain: There is mild diffuse atrophy. There is no intracranial mass, hemorrhage, extra-axial fluid collection, or midline shift. There is patchy small vessel disease throughout the centra semiovale bilaterally. Elsewhere gray-white compartments appear unremarkable. No acute infarct is appreciable on this study. Vascular: There is no hyperdense vessel. There is calcification in each carotid siphon region. Skull: The bony calvarium appears intact. Sinuses/Orbits: There is mild mucosal thickening in several ethmoid air cells. Other visualized paranasal sinuses are clear. Visualized orbits appear symmetric bilaterally. Other: Visualized mastoid air cells clear. IMPRESSION: Atrophy with extensive small vessel disease throughout the centra semiovale bilaterally. No intracranial mass, hemorrhage, or extra-axial fluid collection. No evidence of acute infarct. There are foci of arterial vascular calcification. There is mucosal thickening in several ethmoid air cells. Electronically Signed   By: Lowella Grip III M.D.  On: 11/29/2016 16:30   Mr Brain Wo Contrast (neuro Protocol)  Result Date: 11/29/2016 CLINICAL DATA:  Intermittent headaches a generalized weakness for 3 days. Nausea. Confusion. Possible stroke. EXAM: MRI HEAD WITHOUT CONTRAST TECHNIQUE: Multiplanar, multiecho pulse sequences of the brain and surrounding structures were obtained without intravenous contrast. COMPARISON:  Head CT 11/29/2016 FINDINGS: Brain: There is no evidence of acute infarct, intracranial hemorrhage, mass, midline shift, or extra-axial fluid collection. Patchy to confluent cerebral white matter T2 hyperintensities are nonspecific but compatible with moderately advanced chronic small vessel ischemia. Chronic lacunar infarcts are noted at the posterior aspect of the right lentiform nucleus and  in the posterior limb of the left internal capsule. There is mild generalized cerebral atrophy. A small chronic infarct is present in the right cerebellum. Vascular: Major intracranial vascular flow voids are preserved. Skull and upper cervical spine: No suspicious osseous lesion. Sinuses/Orbits: Bilateral cataract extraction. Trace bilateral mastoid effusions. Clear paranasal sinuses. Other: None. IMPRESSION: 1. No acute intracranial abnormality. 2. Moderately advanced chronic small vessel ischemia. Electronically Signed   By: Logan Bores M.D.   On: 11/29/2016 17:42    Procedures Procedures (including critical care time)  Medications Ordered in ED Medications - No data to display   Initial Impression / Assessment and Plan / ED Course  I have reviewed the triage vital signs and the nursing notes.  Pertinent labs & imaging results that were available during my care of the patient were reviewed by me and considered in my medical decision making (see chart for details).     Patient with presentation with concern for new stroke. Patient had a stroke a number of years ago like 2008 that resulted in right-sided weakness. On Friday patient noted that she had increased weakness to her right hand. Had difficulty pulling tissue out of a box. That has persisted. The other weakness is baseline. Patient's also had intermittent headaches in the back of her head they last 5-10 minutes. No headache currently. She has long-standing complaint of some nausea quickly in the morning for the past 6 months. Patient primary care doctor is in the [place] area.  Workup here today CT head and MRI brain without any acute findings. Patient's labs without significant abnormalities. Patient has had some persistent hypertension but she had not had her hypertensive meds here today. Patient has taken her medications now and blood pressure is slowly improving. Most recent blood pressure was 182/74. Patient stable for discharge  home. Will follow-up with her primary care doctor. She will return for any new or worse symptoms.  Final Clinical Impressions(s) / ED Diagnoses   Final diagnoses:  Weakness    New Prescriptions New Prescriptions   No medications on file     Fredia Sorrow, MD 11/29/16 [phone]

## 2016-11-29 NOTE — ED Notes (Signed)
Pt resting in lobby. Nad. No changes.

## 2017-02-11 ENCOUNTER — Ambulatory Visit (INDEPENDENT_AMBULATORY_CARE_PROVIDER_SITE_OTHER): Payer: Medicare Other | Admitting: Nurse Practitioner

## 2017-02-11 ENCOUNTER — Other Ambulatory Visit: Payer: Self-pay

## 2017-02-11 ENCOUNTER — Encounter: Payer: Self-pay | Admitting: Nurse Practitioner

## 2017-02-11 VITALS — BP 214/101 | HR 69 | Temp 98.1°F | Ht 64.5 in | Wt 214.0 lb

## 2017-02-11 DIAGNOSIS — K5900 Constipation, unspecified: Secondary | ICD-10-CM

## 2017-02-11 DIAGNOSIS — Z86010 Personal history of colonic polyps: Secondary | ICD-10-CM

## 2017-02-11 DIAGNOSIS — K625 Hemorrhage of anus and rectum: Secondary | ICD-10-CM

## 2017-02-11 DIAGNOSIS — K649 Unspecified hemorrhoids: Secondary | ICD-10-CM | POA: Diagnosis not present

## 2017-02-11 MED ORDER — PEG 3350-KCL-NA BICARB-NACL 420 G PO SOLR: 4000.0000 mL | ORAL | 0 refills | Status: DC

## 2017-02-11 NOTE — Progress Notes (Signed)
cc'ed to pcp °

## 2017-02-11 NOTE — Progress Notes (Addendum)
REVIEWED-NO ADDITIONAL RECOMMENDATIONS.  Referring Provider: Thea Alken, FNP Primary Care Physician:  Thea Alken Primary GI:  Dr. Oneida Alar  Chief Complaint  Patient presents with  . Hemorrhoids    some bleeding  . Constipation    HPI:   Sandra [name] is a 76 y.o. female who presents For follow-up on hemorrhoids, constipation, GI bleed and schedule colonoscopy. Patient was last seen in our office 11/19/2016 for hemorrhoids, rectal bleeding, constipation, dysphagia. Patient has a history of hemorrhoids which improved with Anusol. She was in the emergency department 11/04/2016 for rectal bleeding described as minimal and the previous 2 days prior to presentation and have been ongoing for about 7 years. Rectal exam revealed external hemorrhoids without masses, brown stool, weakly heme positive. CBC essentially normal and recommended GI follow-up. At her last visit she was not doing very well noting she was still having rectal bleeding and Anusol helps but not resolved. Sometimes spotting, sometimes more significant. Bleeding is intermittent and not with every bowel movement. Some morning time weakness. Her last colon prep for her previous colonoscopy was poor but 2 polyps were removed and recommended a repeat in 2019. Records pending.  Recommended continue current medications including rectal cream, request was made for colonoscopy and EGD records from Dr. Posey Pronto and [place], [place] and follow-up in September.  Previous colonoscopy and upper endoscopy results were received and reviewed. There were completed 10/16/2015 which found fair to poor prep with 24-6 mm polyps in the ascending colon and transverse colon, tortuous colon. Surgical pathology found the possibility tubular adenoma, recommended repeat colonoscopy in 2 years (2019). Last EGD completed 01/01/2016 which found questionable mild stricture of the GE junction with nodularity status post dilation and biopsy with surgical  pathology finding of biopsies to be mild chronic gastritis without H. Pylori.  It was decided she should have a colonoscopy before the end of 2018 due to poor prep and tubular adenomas.  Today she states she's ok overall. Still with spotting toilet tissue hematochezia. She's moving to [place] this coming Monday. Denies abdominal pain, N/V, dark stools (occasionally takes Pepto Bismol), fever, chills, unintentional weight loss. Is working on dental care and having dentures made. Denies dysphagia. Started eating beans almost every day which significantly helps constipation but with "every once and a while" flare of constipation. Denies chest pain, dyspnea, dizziness, lightheadedness, syncope, near syncope. Denies any other upper or lower GI symptoms.    Past Medical History:  Diagnosis Date  . Colon polyp   . Diabetes mellitus without complication ([place])   . GI bleed   . High cholesterol   . Hypertension   . PAD (peripheral artery disease) ([place])   . Peripheral neuropathy     Past Surgical History:  Procedure Laterality Date  . ABDOMINAL HYSTERECTOMY      Current Outpatient Prescriptions  Medication Sig Dispense Refill  . Ascorbic Acid (VITAMIN C) 1000 MG tablet Take 1,000 mg by mouth daily.    Marland Kitchen aspirin EC 81 MG tablet Take 81 mg by mouth daily.    . ergocalciferol (DRISDOL) 8000 UNIT/ML drops Take by mouth daily. Takes 2 drops daily    . esomeprazole (NEXIUM) 40 MG capsule Take 40 mg by mouth as needed.     . hydrocortisone (ANUSOL-HC) 2.5 % rectal cream Apply rectally 2 times daily (Patient taking differently: Place 1 application rectally as needed for hemorrhoids. Apply rectally 2 times daily) 28.35 g 1  . LANTUS 100 UNIT/ML injection Inject 44 Units into the  skin at bedtime.     Marland Kitchen losartan (COZAAR) 100 MG tablet Take 1 tablet by mouth daily.    . Multiple Vitamins-Minerals (ONE-A-DAY 50 PLUS PO) Take 1 tablet by mouth daily.    . Omega-3 Fatty Acids (FISH OIL) 1200 MG CAPS Take  1 capsule by mouth daily.    . polyethylene glycol powder (GLYCOLAX/MIRALAX) powder Take 1 Container by mouth as needed.     . simvastatin (ZOCOR) 20 MG tablet Take 1 tablet by mouth every evening.    . TOPROL XL 50 MG 24 hr tablet Take 1 tablet by mouth daily.    . vitamin B-12 (CYANOCOBALAMIN) 1000 MCG tablet Take 1,000 mcg by mouth daily.     No current facility-administered medications for this visit.     Allergies as of 02/11/2017 - Review Complete 02/11/2017  Allergen Reaction Noted  . Hctz [hydrochlorothiazide] Other (See Comments) 11/29/2016  . Morphine and related  06/16/2016  . Valium [diazepam] Other (See Comments) 05/29/2016    Family History  Problem Relation Age of Onset  . Colon cancer Neg Hx     Social History   Social History  . Marital status: Widowed    Spouse name: N/A  . Number of children: N/A  . Years of education: N/A   Social History Main Topics  . Smoking status: Never Smoker  . Smokeless tobacco: Never Used  . Alcohol use No  . Drug use: No  . Sexual activity: Not Asked   Other Topics Concern  . None   Social History Narrative  . None    Review of Systems: Complete ROS negative except as per HPI.   Physical Exam: BP (!) 214/101   Pulse 69   Temp 98.1 F (36.7 C) (Oral)   Ht 5' 4.5" (1.638 m)   Wt 214 lb (97.1 kg)   BMI 36.17 kg/m  General:   Alert and oriented. Pleasant and cooperative. Well-nourished and well-developed.  Eyes:  Without icterus, sclera clear and conjunctiva pink.  Ears:  Normal auditory acuity. Cardiovascular:  S1, S2 present without murmurs appreciated. Extremities without clubbing or edema. Respiratory:  Clear to auscultation bilaterally. No wheezes, rales, or rhonchi. No distress.  Gastrointestinal:  +BS, rounded but soft, non-tender and non-distended. No HSM noted. No guarding or rebound. No masses appreciated.  Rectal:  Deferred  Musculoskalatal:  Symmetrical without gross deformities. Neurologic:  Alert  and oriented x4;  grossly normal neurologically. Psych:  Alert and cooperative. Normal mood and affect. Heme/Lymph/Immune: No excessive bruising noted.    02/11/2017 10:57 AM   Disclaimer: This note was dictated with voice recognition software. Similar sounding words can inadvertently be transcribed and may not be corrected upon review.

## 2017-02-11 NOTE — Patient Instructions (Signed)
1. We will schedule your procedure for you. 2. We will have you on clear liquids for 2 days prior to your procedure. 3. We will give you prep instructions to include an extended prep to ensure your colon is visible during your colonoscopy. 4. Continue your other medications including the Anusol cream for your hemorrhoids. 5. Return for follow-up based on recommendations made after your procedure. 6. Call us if you have any questions or concerns.

## 2017-02-11 NOTE — Assessment & Plan Note (Signed)
Symptoms have significantly improved with increased dietary fiber and MiraLAX when necessary. Recommend she continue fiber, MiraLAX one to 2 times a day as needed for breakthrough constipation. Return for follow-up based on postprocedure recommendations.

## 2017-02-11 NOTE — Assessment & Plan Note (Signed)
Last colonoscopy was a poor prep but found to polyps which were tubular adenoma per surgical pathology. Recommended 2 year repeat exam and would be due next year. Given rectal bleeding as per above we will proceed with colonoscopy at this time. Return for follow-up based on postprocedure recommendations. Continue other current medications.

## 2017-02-11 NOTE — Assessment & Plan Note (Signed)
Continued rectal bleeding with a history of tubular adenoma and poor prep at her last colonoscopy. We will proceed with a colonoscopy to further evaluate. Given poor prep at her last colonoscopy L plan for clear liquids for 2 days and one and a half prep doses. Return for follow-up based on postprocedure recommendations. Continue other medications.  Proceed with colonoscopy with Dr. Oneida Alar in the near future. The risks, benefits, and alternatives have been discussed in detail with the patient. They state understanding and desire to proceed.   The patient is not on any anticoagulants, anxiolytics, chronic pain medications, or antidepressants. Denies alcohol and drug use. Conscious sedation should be adequate for her procedure.

## 2017-02-11 NOTE — Assessment & Plan Note (Signed)
Persistent hemorrhoids, continued rectal bleeding/spotting intermittently. At this point we'll proceed with colonoscopy as previously recommended as per below. Return for follow-up based on postprocedure recommendations.

## 2017-02-27 ENCOUNTER — Emergency Department (HOSPITAL_COMMUNITY)
Admission: EM | Admit: 2017-02-27 | Discharge: 2017-02-27 | Disposition: A | Discharge status: Home / Self Care | Payer: Medicare Other | Attending: Emergency Medicine | Admitting: Emergency Medicine

## 2017-02-27 ENCOUNTER — Encounter (HOSPITAL_COMMUNITY): Payer: Self-pay | Admitting: Emergency Medicine

## 2017-02-27 DIAGNOSIS — Z794 Long term (current) use of insulin: Secondary | ICD-10-CM | POA: Insufficient documentation

## 2017-02-27 DIAGNOSIS — Z7982 Long term (current) use of aspirin: Secondary | ICD-10-CM | POA: Diagnosis not present

## 2017-02-27 DIAGNOSIS — E119 Type 2 diabetes mellitus without complications: Secondary | ICD-10-CM | POA: Diagnosis not present

## 2017-02-27 DIAGNOSIS — I10 Essential (primary) hypertension: Secondary | ICD-10-CM | POA: Diagnosis present

## 2017-02-27 DIAGNOSIS — Z760 Encounter for issue of repeat prescription: Secondary | ICD-10-CM

## 2017-02-27 DIAGNOSIS — Z79899 Other long term (current) drug therapy: Secondary | ICD-10-CM | POA: Insufficient documentation

## 2017-02-27 LAB — I-STAT CHEM 8, ED
BUN: 19 mg/dL (ref 6–20)
Calcium, Ion: 1.14 mmol/L — ABNORMAL LOW (ref 1.15–1.40)
Chloride: 101 mmol/L (ref 101–111)
Creatinine, Ser: 0.9 mg/dL (ref 0.44–1.00)
Glucose, Bld: 166 mg/dL — ABNORMAL HIGH (ref 65–99)
HEMATOCRIT: 44 % (ref 36.0–46.0)
HEMOGLOBIN: 15 g/dL (ref 12.0–15.0)
POTASSIUM: 3.4 mmol/L — AB (ref 3.5–5.1)
SODIUM: 144 mmol/L (ref 135–145)
TCO2: 32 mmol/L (ref 22–32)

## 2017-02-27 LAB — CBG MONITORING, ED: GLUCOSE-CAPILLARY: 177 mg/dL — AB (ref 65–99)

## 2017-02-27 MED ORDER — METOPROLOL SUCCINATE ER 100 MG PO TB24: 100.0000 mg | ORAL_TABLET | Freq: Every day | ORAL | 0 refills | Status: DC

## 2017-02-27 MED ORDER — LANTUS 100 UNIT/ML ~~LOC~~ SOLN: 44.0000 [IU] | Freq: Every day | SUBCUTANEOUS | 0 refills | Status: DC

## 2017-02-27 NOTE — ED Triage Notes (Signed)
Pt states she has been having trouble with her blood pressure.  Denies associated complaints.

## 2017-02-27 NOTE — ED Triage Notes (Signed)
Pt reports throwing her Lantus away after her power has been out for several days.  States she thought it was unsafe to use.
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
## 2017-02-27 NOTE — Discharge Instructions (Signed)
I recommend increased your current Toprol to 2 tablets in the morning in place of one, then fill the higher dose prescription once you are out of your old prescription.

## 2017-03-02 ENCOUNTER — Encounter (HOSPITAL_COMMUNITY): Payer: Self-pay | Admitting: Emergency Medicine

## 2017-03-02 ENCOUNTER — Emergency Department (HOSPITAL_COMMUNITY)
Admission: EM | Admit: 2017-03-02 | Discharge: 2017-03-02 | Disposition: A | Discharge status: Home / Self Care | Payer: Medicare Other | Attending: Emergency Medicine | Admitting: Emergency Medicine

## 2017-03-02 DIAGNOSIS — K625 Hemorrhage of anus and rectum: Secondary | ICD-10-CM | POA: Insufficient documentation

## 2017-03-02 DIAGNOSIS — Z7982 Long term (current) use of aspirin: Secondary | ICD-10-CM | POA: Insufficient documentation

## 2017-03-02 DIAGNOSIS — I10 Essential (primary) hypertension: Secondary | ICD-10-CM | POA: Diagnosis not present

## 2017-03-02 DIAGNOSIS — K644 Residual hemorrhoidal skin tags: Secondary | ICD-10-CM | POA: Diagnosis not present

## 2017-03-02 DIAGNOSIS — Z79899 Other long term (current) drug therapy: Secondary | ICD-10-CM | POA: Insufficient documentation

## 2017-03-02 DIAGNOSIS — E119 Type 2 diabetes mellitus without complications: Secondary | ICD-10-CM | POA: Insufficient documentation

## 2017-03-02 HISTORY — DX: Constipation, unspecified: K59.00

## 2017-03-02 HISTORY — DX: Hemorrhage of anus and rectum: K62.5

## 2017-03-02 HISTORY — DX: Unspecified hemorrhoids: K64.9

## 2017-03-02 LAB — CBC
HEMATOCRIT: 47.6 % — AB (ref 36.0–46.0)
HEMATOCRIT: 45.6 % (ref 36.0–46.0)
HEMOGLOBIN: 14.9 g/dL (ref 12.0–15.0)
Hemoglobin: 15.4 g/dL — ABNORMAL HIGH (ref 12.0–15.0)
MCH: 28.6 pg (ref 26.0–34.0)
MCH: 28.7 pg (ref 26.0–34.0)
MCHC: 32.4 g/dL (ref 30.0–36.0)
MCHC: 32.7 g/dL (ref 30.0–36.0)
MCV: 88.3 fL (ref 78.0–100.0)
MCV: 87.9 fL (ref 78.0–100.0)
Platelets: 245 10*3/uL (ref 150–400)
Platelets: 241 10*3/uL (ref 150–400)
RBC: 5.39 MIL/uL — AB (ref 3.87–5.11)
RBC: 5.19 MIL/uL — ABNORMAL HIGH (ref 3.87–5.11)
RDW: 14.3 % (ref 11.5–15.5)
RDW: 14.3 % (ref 11.5–15.5)
WBC: 9.1 10*3/uL (ref 4.0–10.5)
WBC: 8.4 10*3/uL (ref 4.0–10.5)

## 2017-03-02 LAB — COMPREHENSIVE METABOLIC PANEL
ALT: 19 U/L (ref 14–54)
AST: 34 U/L (ref 15–41)
Albumin: 3.9 g/dL (ref 3.5–5.0)
Alkaline Phosphatase: 83 U/L (ref 38–126)
Anion gap: 9 (ref 5–15)
BUN: 15 mg/dL (ref 6–20)
CHLORIDE: 101 mmol/L (ref 101–111)
CO2: 35 mmol/L — AB (ref 22–32)
Calcium: 9.3 mg/dL (ref 8.9–10.3)
Creatinine, Ser: 0.91 mg/dL (ref 0.44–1.00)
GFR calc Af Amer: >60 mL/min (ref >60)
GFR, EST NON AFRICAN AMERICAN: 59 mL/min — AB (ref >60)
Glucose, Bld: 75 mg/dL (ref 65–99)
POTASSIUM: 3.7 mmol/L (ref 3.5–5.1)
SODIUM: 145 mmol/L (ref 135–145)
Total Bilirubin: 0.9 mg/dL (ref 0.3–1.2)
Total Protein: 8 g/dL (ref 6.5–8.1)

## 2017-03-02 LAB — POC OCCULT BLOOD, ED: Fecal Occult Bld: POSITIVE — AB

## 2017-03-02 NOTE — ED Provider Notes (Signed)
[facility] EMERGENCY DEPARTMENT Provider Note   CSN: [id number] Arrival date & time: 03/02/17  0935     History   Chief Complaint Chief Complaint  Patient presents with  . Rectal Bleeding    HPI Sandra [name] is a 76 y.o. female.  HPI  Pt was seen at 1700. Per pt, c/o gradual onset and persistence of constant acute flair of her chronic rectal bleeding since last night. Pt has had intermittent rectal bleeding for the past 7 years, dx hemorrhoids. Pt is due for a colonoscopy in 3 weeks. CT A/P from earlier this year, as well as colonoscopy from last year, were both reassuring. Denies abd pain, no N/V/D, no CP/SOB, no lightheadedness, no rectal pain, no rectal discharge, no fevers.   GI: Dr. Oneida Alar Past Medical History:  Diagnosis Date  . Colon polyp   . Constipation   . Diabetes mellitus without complication ([place])   . GI bleed   . Hemorrhoids   . High cholesterol   . Hypertension   . PAD (peripheral artery disease) ([place])   . Peripheral neuropathy   . Rectal bleeding    "for 7 years"    Patient Active Problem List   Diagnosis Date Noted  . History of adenomatous polyp of colon 02/11/2017  . Dysphagia 11/19/2016  . Essential hypertension 08/12/2016  . Hemorrhoids 06/16/2016  . Rectal bleeding 06/16/2016  . Constipation 06/16/2016    Past Surgical History:  Procedure Laterality Date  . ABDOMINAL HYSTERECTOMY      OB History    Gravida Para Term Preterm AB Living             6   SAB TAB Ectopic Multiple Live Births                   Home Medications    Prior to Admission medications   Medication Sig Start Date End Date Taking? Authorizing Provider  Ascorbic Acid (VITAMIN C) 1000 MG tablet Take 1,000 mg by mouth daily.    [provider]  aspirin EC 81 MG tablet Take 81 mg by mouth daily.    [provider]  ergocalciferol (DRISDOL) 8000 UNIT/ML drops Take by mouth daily. Takes 2 drops daily    [provider]  esomeprazole  (NEXIUM) 40 MG capsule Take 40 mg by mouth as needed.     [provider]  hydrocortisone (ANUSOL-HC) 2.5 % rectal cream Apply rectally 2 times daily Patient taking differently: Place 1 application rectally as needed for hemorrhoids. Apply rectally 2 times daily 11/04/16   [name], MD  LANTUS 100 UNIT/ML injection Inject 0.44 mLs (44 Units total) into the skin at bedtime. 02/27/17   Evalee Jefferson, PA-C  losartan (COZAAR) 100 MG tablet Take 1 tablet by mouth daily. 04/03/16   [provider]  metoprolol succinate (TOPROL-XL) 100 MG 24 hr tablet Take 1 tablet (100 mg total) by mouth daily. 02/27/17   Evalee Jefferson, PA-C  Multiple Vitamins-Minerals (ONE-A-DAY 50 PLUS PO) Take 1 tablet by mouth daily.    [provider]  Omega-3 Fatty Acids (FISH OIL) 1200 MG CAPS Take 1 capsule by mouth daily.    [provider]  polyethylene glycol powder (GLYCOLAX/MIRALAX) powder Take 1 Container by mouth as needed.     [provider]  polyethylene glycol-electrolytes (TRILYTE) 420 g solution Take 4,000 mLs by mouth as directed. 02/11/17   Fields, Marga Melnick, MD  simvastatin (ZOCOR) 20 MG tablet Take 1 tablet by mouth every  evening. 05/03/16   [provider]  vitamin B-12 (CYANOCOBALAMIN) 1000 MCG tablet Take 1,000 mcg by mouth daily.    [provider]    Family History Family History  Problem Relation Age of Onset  . Colon cancer Neg Hx     Social History Social History  Substance Use Topics  . Smoking status: Never Smoker  . Smokeless tobacco: Never Used  . Alcohol use No     Allergies   Hctz [hydrochlorothiazide]; Morphine and related; and Valium [diazepam]   Review of Systems Review of Systems ROS: Statement: All systems negative except as marked or noted in the HPI; Constitutional: Negative for fever and chills. ; ; Eyes: Negative for eye pain, redness and discharge. ; ; ENMT: Negative for ear pain, hoarseness, nasal congestion, sinus  pressure and sore throat. ; ; Cardiovascular: Negative for chest pain, palpitations, diaphoresis, dyspnea and peripheral edema. ; ; Respiratory: Negative for cough, wheezing and stridor. ; ; Gastrointestinal: Negative for nausea, vomiting, diarrhea, abdominal pain, blood in stool, hematemesis, jaundice and +rectal bleeding. . ; ; Genitourinary: Negative for dysuria, flank pain and hematuria. ; ; Musculoskeletal: Negative for back pain and neck pain. Negative for swelling and trauma.; ; Skin: Negative for pruritus, rash, abrasions, blisters, bruising and skin lesion.; ; Neuro: Negative for headache, lightheadedness and neck stiffness. Negative for weakness, altered level of consciousness, altered mental status, extremity weakness, paresthesias, involuntary movement, seizure and syncope.       Physical Exam Updated Vital Signs BP (!) 148/103 (BP Location: Right Arm)   Pulse 63   Temp 98.5 F (36.9 C) (Oral)   Resp 18   Ht 5' 4.5" (1.638 m)   Wt 97.1 kg (214 lb)   SpO2 92%   BMI 36.17 kg/m    17:32 Orthostatic Vital Signs MW  Orthostatic Lying   BP- Lying:  185/98  Pulse- Lying: 64      Orthostatic Sitting  BP- Sitting:  190/99  Pulse- Sitting: 65      Orthostatic Standing at 0 minutes  BP- Standing at 0 minutes:  193/107  Pulse- Standing at 0 minutes: 71     Physical Exam 1705: Physical examination:  Nursing notes reviewed; Vital signs and O2 SAT reviewed;  Constitutional: Well developed, Well nourished, Well hydrated, In no acute distress; Head:  Normocephalic, atraumatic; Eyes: EOMI, PERRL, No scleral icterus; ENMT: Mouth and pharynx normal, Mucous membranes moist; Neck: Supple, Full range of motion, No lymphadenopathy; Cardiovascular: Regular rate and rhythm, No murmur, rub, or gallop; Respiratory: Breath sounds clear & equal bilaterally, No rales, rhonchi, wheezes.  Speaking full sentences with ease, Normal respiratory effort/excursion; Chest: Nontender, Movement normal;  Abdomen: Soft, Nontender, Nondistended, Normal bowel sounds. Rectal exam performed w/permission of pt and ED RN chaperone present.  Anal tone normal.  Non-tender, soft brown stool in rectal vault, heme positive.  No fissures, +external hemorrhoids without obvious bleeding.; Genitourinary: No CVA tenderness; Extremities: Pulses normal, No tenderness, No edema, No calf edema or asymmetry.; Neuro: AA&Ox3, Major CN grossly intact.  Speech clear. No gross focal motor or sensory deficits in extremities.; Skin: Color normal, Warm, Dry.   ED Treatments / Results  Labs (all labs ordered are listed, but only abnormal results are displayed)   EKG  EKG Interpretation None       Radiology   Procedures Procedures (including critical care time)  Medications Ordered in ED Medications - No data to display   Initial Impression / Assessment and Plan / ED Course  I have reviewed the triage vital signs and the nursing notes.  Pertinent labs & imaging results that were available during my care of the patient were reviewed by me and considered in my medical decision making (see chart for details).  MDM Reviewed: previous chart, nursing note and vitals Reviewed previous: labs and CT scan Interpretation: labs   Results for orders placed or performed during the hospital encounter of 03/02/17  Comprehensive metabolic panel  Result Value Ref Range   Sodium 145 135 - 145 mmol/L   Potassium 3.7 3.5 - 5.1 mmol/L   Chloride 101 101 - 111 mmol/L   CO2 35 (H) 22 - 32 mmol/L   Glucose, Bld 75 65 - 99 mg/dL   BUN 15 6 - 20 mg/dL   Creatinine, Ser 0.91 0.44 - 1.00 mg/dL   Calcium 9.3 8.9 - 10.3 mg/dL   Total Protein 8.0 6.5 - 8.1 g/dL   Albumin 3.9 3.5 - 5.0 g/dL   AST 34 15 - 41 U/L   ALT 19 14 - 54 U/L   Alkaline Phosphatase 83 38 - 126 U/L   Total Bilirubin 0.9 0.3 - 1.2 mg/dL   GFR calc non Af Amer 59 (L) >60 mL/min   GFR calc Af Amer >60 >60 mL/min   Anion gap 9 5 - 15  CBC  Result Value Ref  Range   WBC 9.1 4.0 - 10.5 K/uL   RBC 5.39 (H) 3.87 - 5.11 MIL/uL   Hemoglobin 15.4 (H) 12.0 - 15.0 g/dL   HCT 47.6 (H) 36.0 - 46.0 %   MCV 88.3 78.0 - 100.0 fL   MCH 28.6 26.0 - 34.0 pg   MCHC 32.4 30.0 - 36.0 g/dL   RDW 14.3 11.5 - 15.5 %   Platelets 245 150 - 400 K/uL  CBC  Result Value Ref Range   WBC 8.4 4.0 - 10.5 K/uL   RBC 5.19 (H) 3.87 - 5.11 MIL/uL   Hemoglobin 14.9 12.0 - 15.0 g/dL   HCT 45.6 36.0 - 46.0 %   MCV 87.9 78.0 - 100.0 fL   MCH 28.7 26.0 - 34.0 pg   MCHC 32.7 30.0 - 36.0 g/dL   RDW 14.3 11.5 - 15.5 %   Platelets 241 150 - 400 K/uL  POC occult blood, ED  Result Value Ref Range   Fecal Occult Bld POSITIVE (A) NEGATIVE    Results for ROCKELLE, HEUERMAN (MRN [id number]) as of 03/02/2017 18:46  Ref. Range 11/04/2016 09:36 11/29/2016 11:55 02/27/2017 16:34 03/02/2017 11:37 03/02/2017 17:06  Hemoglobin Latest Ref Range: 12.0 - 15.0 g/dL 14.6 14.5 15.0 15.4 (H) 14.9  HCT Latest Ref Range: 36.0 - 46.0 % 44.2 43.8 44.0 47.6 (H) 45.6    1845:  Pt not orthostatic on VS. Stool heme positive. H/H x2 stable/per baseline. Abd remains benign. Doubt significant GI bleed at this time. Tx hemorrhoids per her previous instructions, f/u GI MD. Dx and testing d/w pt.  Questions answered.  Verb understanding, agreeable to d/c home with outpt f/u.     Final Clinical Impressions(s) / ED Diagnoses   Final diagnoses:  None    New Prescriptions New Prescriptions   No medications on file     Francine Graven, DO 03/07/17 0809

## 2017-03-02 NOTE — ED Triage Notes (Signed)
PT c/o bright red rectal bleeding x1 day. PT states she takes an 81mg  aspirin daily. PT states she is scheduled for a colonoscopy on 03/21/17 with Dr. Oneida Alar.

## 2017-03-02 NOTE — Discharge Instructions (Signed)
Continue to take your usual prescriptions as previously directed.  Begin to take over the counter fiber products (ie: Citucel, Metamucil) or stool softener (colace), as directed on packaging, for the next month.  Use over the counter hemorrhoidal relief products (ie:  preparation H, anusol), as directed on packaging, as needed for symptoms.  Sit in a warm water tub several times per day for the next week.  Call your regular GI doctor tomorrow to schedule a follow up appointment this week.  Return to the Emergency Department immediately if worsening.

## 2017-03-17 ENCOUNTER — Ambulatory Visit (INDEPENDENT_AMBULATORY_CARE_PROVIDER_SITE_OTHER): Payer: Medicare Other | Admitting: Family Medicine

## 2017-03-17 ENCOUNTER — Encounter (INDEPENDENT_AMBULATORY_CARE_PROVIDER_SITE_OTHER): Payer: Self-pay

## 2017-03-17 ENCOUNTER — Encounter: Payer: Self-pay | Admitting: Family Medicine

## 2017-03-17 VITALS — BP 170/84 | HR 80 | Temp 97.1°F | Resp 16 | Ht 65.0 in | Wt 206.8 lb

## 2017-03-17 DIAGNOSIS — Z23 Encounter for immunization: Secondary | ICD-10-CM

## 2017-03-17 DIAGNOSIS — E785 Hyperlipidemia, unspecified: Secondary | ICD-10-CM

## 2017-03-17 DIAGNOSIS — Z79899 Other long term (current) drug therapy: Secondary | ICD-10-CM | POA: Diagnosis not present

## 2017-03-17 DIAGNOSIS — E118 Type 2 diabetes mellitus with unspecified complications: Secondary | ICD-10-CM | POA: Diagnosis not present

## 2017-03-17 DIAGNOSIS — E1159 Type 2 diabetes mellitus with other circulatory complications: Secondary | ICD-10-CM | POA: Diagnosis not present

## 2017-03-17 DIAGNOSIS — I10 Essential (primary) hypertension: Secondary | ICD-10-CM | POA: Diagnosis not present

## 2017-03-17 DIAGNOSIS — E1169 Type 2 diabetes mellitus with other specified complication: Secondary | ICD-10-CM | POA: Diagnosis not present

## 2017-03-17 DIAGNOSIS — I152 Hypertension secondary to endocrine disorders: Secondary | ICD-10-CM

## 2017-03-17 DIAGNOSIS — Z794 Long term (current) use of insulin: Secondary | ICD-10-CM | POA: Diagnosis not present

## 2017-03-17 MED ORDER — AMLODIPINE BESYLATE 10 MG PO TABS: 10.0000 mg | ORAL_TABLET | Freq: Every day | ORAL | 3 refills | Status: DC
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
## 2017-03-18 LAB — HEPATIC FUNCTION PANEL
AG RATIO: 1.2 (calc) (ref 1.0–2.5)
ALT: 18 U/L (ref 6–29)
AST: 27 U/L (ref 10–35)
Albumin: 3.7 g/dL (ref 3.6–5.1)
Alkaline phosphatase (APISO): 76 U/L (ref 33–130)
BILIRUBIN TOTAL: 0.7 mg/dL (ref 0.2–1.2)
Bilirubin, Direct: 0.1 mg/dL (ref 0.0–0.2)
Globulin: 3.2 g/dL (calc) (ref 1.9–3.7)
Indirect Bilirubin: 0.6 mg/dL (calc) (ref 0.2–1.2)
Total Protein: 6.9 g/dL (ref 6.1–8.1)

## 2017-03-18 LAB — BASIC METABOLIC PANEL
BUN: 13 mg/dL (ref 7–25)
CO2: 32 mmol/L (ref 20–32)
Calcium: 9.4 mg/dL (ref 8.6–10.4)
Chloride: 104 mmol/L (ref 98–110)
Creat: 0.8 mg/dL (ref 0.60–0.93)
GLUCOSE: 41 mg/dL — AB (ref 65–99)
POTASSIUM: 4.1 mmol/L (ref 3.5–5.3)
SODIUM: 144 mmol/L (ref 135–146)

## 2017-03-18 LAB — MICROALBUMIN / CREATININE URINE RATIO
CREATININE, URINE: 223 mg/dL (ref 20–275)
Microalb Creat Ratio: 106 mcg/mg creat — ABNORMAL HIGH (ref <30)
Microalb, Ur: 23.7 mg/dL

## 2017-03-18 LAB — LIPID PANEL
Cholesterol: 178 mg/dL (ref <200)
HDL: 55 mg/dL (ref >50)
LDL Cholesterol (Calc): 106 mg/dL (calc) — ABNORMAL HIGH
NON-HDL CHOLESTEROL (CALC): 123 mg/dL (ref <130)
TRIGLYCERIDES: 82 mg/dL (ref <150)
Total CHOL/HDL Ratio: 3.2 (calc) (ref <5.0)

## 2017-03-18 LAB — HEMOGLOBIN A1C
HEMOGLOBIN A1C: 6.9 %{Hb} — AB (ref <5.7)
Mean Plasma Glucose: 151 (calc)
eAG (mmol/L): 8.4 (calc)

## 2017-03-21 ENCOUNTER — Encounter (HOSPITAL_COMMUNITY)
Admission: RE | Disposition: A | Discharge status: Home / Self Care | Payer: Self-pay | Source: Ambulatory Visit | Attending: Gastroenterology

## 2017-03-21 ENCOUNTER — Encounter (HOSPITAL_COMMUNITY): Payer: Self-pay | Admitting: *Deleted

## 2017-03-21 ENCOUNTER — Ambulatory Visit (HOSPITAL_COMMUNITY)
Admission: RE | Admit: 2017-03-21 | Discharge: 2017-03-21 | Disposition: A | Discharge status: Home / Self Care | Payer: Medicare Other | Source: Ambulatory Visit | Attending: Gastroenterology | Admitting: Gastroenterology

## 2017-03-21 ENCOUNTER — Other Ambulatory Visit: Payer: Self-pay

## 2017-03-21 ENCOUNTER — Telehealth: Payer: Self-pay | Admitting: Family Medicine

## 2017-03-21 ENCOUNTER — Encounter: Payer: Self-pay | Admitting: Family Medicine

## 2017-03-21 DIAGNOSIS — Z7982 Long term (current) use of aspirin: Secondary | ICD-10-CM | POA: Diagnosis not present

## 2017-03-21 DIAGNOSIS — Z888 Allergy status to other drugs, medicaments and biological substances status: Secondary | ICD-10-CM | POA: Diagnosis not present

## 2017-03-21 DIAGNOSIS — Z79899 Other long term (current) drug therapy: Secondary | ICD-10-CM | POA: Diagnosis not present

## 2017-03-21 DIAGNOSIS — K5900 Constipation, unspecified: Secondary | ICD-10-CM | POA: Diagnosis not present

## 2017-03-21 DIAGNOSIS — E1140 Type 2 diabetes mellitus with diabetic neuropathy, unspecified: Secondary | ICD-10-CM | POA: Diagnosis not present

## 2017-03-21 DIAGNOSIS — Z90710 Acquired absence of both cervix and uterus: Secondary | ICD-10-CM | POA: Insufficient documentation

## 2017-03-21 DIAGNOSIS — Z86010 Personal history of colonic polyps: Secondary | ICD-10-CM | POA: Diagnosis not present

## 2017-03-21 DIAGNOSIS — K5730 Diverticulosis of large intestine without perforation or abscess without bleeding: Secondary | ICD-10-CM | POA: Insufficient documentation

## 2017-03-21 DIAGNOSIS — Q438 Other specified congenital malformations of intestine: Secondary | ICD-10-CM | POA: Insufficient documentation

## 2017-03-21 DIAGNOSIS — K921 Melena: Secondary | ICD-10-CM | POA: Insufficient documentation

## 2017-03-21 DIAGNOSIS — Z833 Family history of diabetes mellitus: Secondary | ICD-10-CM | POA: Insufficient documentation

## 2017-03-21 DIAGNOSIS — I10 Essential (primary) hypertension: Secondary | ICD-10-CM | POA: Insufficient documentation

## 2017-03-21 DIAGNOSIS — K648 Other hemorrhoids: Secondary | ICD-10-CM | POA: Insufficient documentation

## 2017-03-21 DIAGNOSIS — Z809 Family history of malignant neoplasm, unspecified: Secondary | ICD-10-CM | POA: Diagnosis not present

## 2017-03-21 DIAGNOSIS — E7800 Pure hypercholesterolemia, unspecified: Secondary | ICD-10-CM | POA: Insufficient documentation

## 2017-03-21 DIAGNOSIS — D122 Benign neoplasm of ascending colon: Secondary | ICD-10-CM | POA: Insufficient documentation

## 2017-03-21 DIAGNOSIS — Z885 Allergy status to narcotic agent status: Secondary | ICD-10-CM | POA: Insufficient documentation

## 2017-03-21 DIAGNOSIS — I739 Peripheral vascular disease, unspecified: Secondary | ICD-10-CM | POA: Insufficient documentation

## 2017-03-21 DIAGNOSIS — K625 Hemorrhage of anus and rectum: Secondary | ICD-10-CM

## 2017-03-21 HISTORY — PX: COLONOSCOPY: SHX5424

## 2017-03-21 LAB — GLUCOSE, CAPILLARY: Glucose-Capillary: 64 mg/dL — ABNORMAL LOW (ref 65–99)

## 2017-03-21 SURGERY — COLONOSCOPY
Anesthesia: Moderate Sedation

## 2017-03-21 MED ORDER — LANTUS 100 UNIT/ML ~~LOC~~ SOLN: 38.0000 [IU] | Freq: Every day | SUBCUTANEOUS | 0 refills | Status: DC

## 2017-03-21 MED ORDER — MEPERIDINE HCL 100 MG/ML IJ SOLN
INTRAMUSCULAR | Status: AC
  Filled 2017-03-21: qty 2

## 2017-03-21 MED ORDER — STERILE WATER FOR IRRIGATION IR SOLN
Status: DC | PRN
  Administered 2017-03-21: 13:00:00

## 2017-03-21 MED ORDER — DEXTROSE-NACL 5-0.9 % IV SOLN
INTRAVENOUS | Status: DC
  Administered 2017-03-21: 11:00:00 via INTRAVENOUS

## 2017-03-21 MED ORDER — MEPERIDINE HCL 100 MG/ML IJ SOLN
INTRAMUSCULAR | Status: DC | PRN
Start: 2017-03-21 — End: 2017-03-21
  Administered 2017-03-21 (×3): 25 mg

## 2017-03-21 MED ORDER — SODIUM CHLORIDE 0.9 % IV SOLN
INTRAVENOUS | Status: DC
  Administered 2017-03-21: 11:00:00 via INTRAVENOUS

## 2017-03-21 MED ORDER — MIDAZOLAM HCL 5 MG/5ML IJ SOLN
INTRAMUSCULAR | Status: DC | PRN
  Administered 2017-03-21: 1 mg via INTRAVENOUS
  Administered 2017-03-21 (×2): 2 mg via INTRAVENOUS

## 2017-03-21 MED ORDER — MIDAZOLAM HCL 5 MG/5ML IJ SOLN
INTRAMUSCULAR | Status: AC
  Filled 2017-03-21: qty 10

## 2017-03-21 NOTE — Telephone Encounter (Signed)
Called patient regarding message below. No answer, left generic message for patient to return call.   

## 2017-03-21 NOTE — Telephone Encounter (Signed)
Please call patient and advised that her blood sugars are over controlled at this time. She needs to decrease her Lantus insulin to 38 units each night. In addition, she needs to monitor her blood sugars and call if her sugars are getting low. Advised her to keep her follow-up appointment.

## 2017-03-21 NOTE — H&P (Addendum)
Primary Care Physician:  Caren Macadam, MD Primary Gastroenterologist:  Dr. Oneida Alar  Pre-Procedure History & Physical: HPI:  Sandra [name] is a 76 y.o. female here for rectal bleeding.  Past Medical History:  Diagnosis Date  . Colon polyp   . Constipation   . Diabetes mellitus without complication ([place])   . GI bleed   . Hemorrhoids   . High cholesterol   . Hypertension   . PAD (peripheral artery disease) ([place])   . Peripheral neuropathy   . Rectal bleeding    "for 7 years"  . Rectal bleeding     Past Surgical History:  Procedure Laterality Date  . ABDOMINAL HYSTERECTOMY    . COLONOSCOPY     X 7    Prior to Admission medications   Medication Sig Start Date End Date Taking? Authorizing Provider  amLODipine (NORVASC) 10 MG tablet Take 1 tablet (10 mg total) by mouth daily. 03/17/17  Yes Hagler, Apolonio Schneiders, MD  Ascorbic Acid (VITAMIN C) 1000 MG tablet Take 1,000 mg by mouth daily.   Yes [provider]  aspirin EC 81 MG tablet Take 81 mg by mouth daily.   Yes [provider]  ergocalciferol (DRISDOL) 8000 UNIT/ML drops Take by mouth daily. Takes 2 drops daily   Yes [provider]  hydrocortisone (ANUSOL-HC) 2.5 % rectal cream Apply rectally 2 times daily Patient taking differently: Place 1 application rectally as needed for hemorrhoids. Apply rectally 2 times daily 11/04/16  Yes [name], MD  LANTUS 100 UNIT/ML injection Inject 0.38 mLs (38 Units total) at bedtime into the skin. 03/21/17  Yes Hagler, Apolonio Schneiders, MD  losartan (COZAAR) 100 MG tablet Take 1 tablet by mouth daily. 04/03/16  Yes [provider]  metoprolol succinate (TOPROL-XL) 100 MG 24 hr tablet Take 1 tablet (100 mg total) by mouth daily. 02/27/17  Yes Idol, Almyra Free, PA-C  Multiple Vitamins-Minerals (ONE-A-DAY 50 PLUS PO) Take 1 tablet by mouth daily.   Yes [provider]  Omega-3 Fatty Acids (FISH OIL) 1200 MG CAPS Take 1 capsule by mouth daily.   Yes [provider]  polyethylene glycol powder (GLYCOLAX/MIRALAX) powder Take 17 g by mouth as needed for mild constipation or moderate constipation.    Yes [provider]  polyethylene glycol-electrolytes (TRILYTE) 420 g solution Take 4,000 mLs by mouth as directed. 02/11/17  Yes Kanisha Duba L, MD  simvastatin (ZOCOR) 20 MG tablet Take 1 tablet by mouth every evening. 05/03/16  Yes [provider]  vitamin B-12 (CYANOCOBALAMIN) 1000 MCG tablet Take 1,000 mcg by mouth daily.   Yes [provider]    Allergies as of 02/11/2017 - Review Complete 02/11/2017  Allergen Reaction Noted  . Hctz [hydrochlorothiazide] Other (See Comments) 11/29/2016  . Morphine and related  06/16/2016  . Valium [diazepam] Other (See Comments) 05/29/2016    Family History  Problem Relation Age of Onset  . Diabetes Mother   . Cancer Father   . Diabetes Sister   . Diabetes Brother   . Diabetes Daughter   . Diabetes Maternal Grandmother   . Diabetes Daughter   . Colon cancer Neg Hx     Social History   Socioeconomic History  . Marital status: Widowed    Spouse name: Not on file  . Number of children: Not on file  . Years of education: Not on file  . Highest education level: Not on file  Social Needs  . Financial resource strain: Not on file  . Food insecurity -  worry: Not on file  . Food insecurity - inability: Not on file  . Transportation needs - medical: Not on file  . Transportation needs - non-medical: Not on file  Occupational History  . Not on file  Tobacco Use  . Smoking status: Never Smoker  . Smokeless tobacco: Never Used  Substance and Sexual Activity  . Alcohol use: No  . Drug use: No  . Sexual activity: No  Other Topics Concern  . Not on file  Social History Narrative  . Not on file    Review of Systems: See HPI, otherwise negative ROS   Physical Exam: BP (!) 181/73   Pulse 86   Temp 97.7 F (36.5 C) (Oral)   Resp 20   Ht 5' 4.5" (1.638 m)   Wt 206 lb  (93.4 kg)   SpO2 99%   BMI 34.81 kg/m  General:   Alert,  pleasant and cooperative in NAD Head:  Normocephalic and atraumatic. Neck:  Supple; Lungs:  Clear throughout to auscultation.    Heart:  Regular rate and rhythm. Abdomen:  Soft, nontender and nondistended. Normal bowel sounds, without guarding, and without rebound.   Neurologic:  Alert and  oriented x4;  grossly normal neurologically.  Impression/Plan:     RECTAL BLEEDING  PLAN:  1. TCS/possible hemorrhoid banding TODAY.  DISCUSSED PROCEDURE, BENEFITS, & RISKS: < 1% PELVIC VEIN SEPSIS, chance of medication reaction, bleeding, perforation, or rupture of spleen/liver.

## 2017-03-21 NOTE — Op Note (Signed)
[facility] Patient Name: Sandra [name] Procedure Date: 03/21/2017 12:08 PM MRN: [id number] Date of Birth: 1941/01/02 Attending MD: Barney Drain MD, MD CSN: [id number] Age: 76 Admit Type: Outpatient Procedure:                Colonoscopy WITH COLD SNARE POLYPECTOMY/IH BANDING                            x3 Indications:              Hematochezia, Personal history of colonic polyps Providers:                Barney Drain MD, MD, Janeece Riggers, RN, Aram Candela Referring MD:             Caren Macadam Medicines:                Meperidine 75 mg IV, Midazolam 6 mg IV Complications:            No immediate complications. Estimated Blood Loss:     Estimated blood loss was minimal. Procedure:                Pre-Anesthesia Assessment:                           - Prior to the procedure, a History and Physical                            was performed, and patient medications and                            allergies were reviewed. The patient's tolerance of                            previous anesthesia was also reviewed. The risks                            and benefits of the procedure and the sedation                            options and risks were discussed with the patient.                            All questions were answered, and informed consent                            was obtained. Prior Anticoagulants: The patient has                            taken aspirin, last dose was 1 day prior to                            procedure. ASA Grade Assessment: II - A patient                            with mild systemic disease. After reviewing the  risks and benefits, the patient was deemed in                            satisfactory condition to undergo the procedure.                            After obtaining informed consent, the colonoscope                            was passed under direct vision. Throughout the                            procedure, the  patient's blood pressure, pulse, and                            oxygen saturations were monitored continuously. The                            EC-[id number] ([id number]) scope was introduced through                            the anus and advanced to the 3 cm into the ileum.                            The colonoscopy was technically difficult and                            complex due to significant looping. Successful                            completion of the procedure was aided by                            straightening and shortening the scope to obtain                            bowel loop reduction and COLOWRAP. The patient                            tolerated the procedure fairly well. The quality of                            the bowel preparation was good. The terminal ileum,                            ileocecal valve, appendiceal orifice, and rectum                            were photographed. Scope In: 12:54:38 PM Scope Out: 1:30:48 PM Scope Withdrawal Time: 0 hours 31 minutes 25 seconds  Total Procedure Duration: 0 hours 36 minutes 10 seconds  Findings:      A 4 mm polyp was found in the mid ascending colon. The polyp was  sessile. The polyp was removed with a cold snare. Resection and       retrieval were complete.      The recto-sigmoid colon, sigmoid colon and descending colon were       significantly redundant.      Multiple small and large-mouthed diverticula were found in the       recto-sigmoid colon, sigmoid colon and descending colon.      Internal hemorrhoids were found during retroflexion. The hemorrhoids       were large. Three bands were successfully placed. A slow ooze remained       at the end of the procedure.      The terminal ileum appeared normal. Impression:               - One 4 mm polyp in the mid ascending colon,                            removed with a cold snare. Resected and retrieved.                           - Redundant colon.                            - Diverticulosis in the recto-sigmoid colon, in the                            sigmoid colon and in the descending colon.                           - Internal hemorrhoids. Banded.                           - The examined portion of the ileum was normal. Moderate Sedation:      Moderate (conscious) sedation was administered by the endoscopy nurse       and supervised by the endoscopist. The following parameters were       monitored: oxygen saturation, heart rate, blood pressure, and response       to care. Total physician intraservice time was 56 minutes. Recommendation:           - High fiber diet.                           - Continue present medications.                           - Await pathology results.                           - Return to my office in 4 months.                           - Patient has a contact number available for                            emergencies. The signs and symptoms of potential  delayed complications were discussed with the                            patient. Return to normal activities tomorrow.                            Written discharge instructions were provided to the                            patient.                           - No repeat colonoscopy due to age. Procedure Code(s):        --- Professional ---                           [phone], Colonoscopy, flexible; with removal of                            tumor(s), polyp(s), or other lesion(s) by snare                            technique                           [phone], Colonoscopy, flexible; with band ligation(s)                            (eg, hemorrhoids)                           99152, Moderate sedation services provided by the                            same physician or other qualified health care                            professional performing the diagnostic or                            therapeutic service that the sedation supports,                             requiring the presence of an independent trained                            observer to assist in the monitoring of the                            patient's level of consciousness and physiological                            status; initial 15 minutes of intraservice time,                            patient age 12  years or older                           [phone], Moderate sedation services; each additional                            15 minutes intraservice time                           99153, Moderate sedation services; each additional                            15 minutes intraservice time                           99153, Moderate sedation services; each additional                            15 minutes intraservice time Diagnosis Code(s):        --- Professional ---                           D12.2, Benign neoplasm of ascending colon                           K64.8, Other hemorrhoids                           K92.1, Melena (includes Hematochezia)                           Z86.010, Personal history of colonic polyps                           K57.30, Diverticulosis of large intestine without                            perforation or abscess without bleeding                           Q43.8, Other specified congenital malformations of                            intestine CPT copyright 2016 [employer]. All rights reserved. The codes documented in this report are preliminary and upon coder review may  be revised to meet current compliance requirements. Barney Drain, MD Barney Drain MD, MD 03/21/2017 1:46:10 PM This report has been signed electronically. Number of Addenda: 0

## 2017-03-22 NOTE — Telephone Encounter (Signed)
Called patient regarding message below. No answer, left generic message for patient to return call.   

## 2017-03-22 NOTE — Telephone Encounter (Signed)
Letter sent and emergency contact called. She states she will have patient return call.

## 2017-03-22 NOTE — Telephone Encounter (Signed)
Please call patient again and also call emergency contact number. Please also send letter in mail to contact our office. .thanks. Gwen Her. [name], MD

## 2017-03-23 NOTE — Telephone Encounter (Signed)
Patient states that she had colonoscopy Monday, they changed her insulin to 33 units then. I informed her to monitor her sugar and call us if it gets too low or high, and to keep appointment. She verbalized understanding.

## 2017-03-24 ENCOUNTER — Telehealth: Payer: Self-pay | Admitting: *Deleted

## 2017-03-24 MED ORDER — "INSULIN SYRINGE-NEEDLE U-100 29G X 1/2"" 0.3 ML MISC": 0 refills | Status: DC

## 2017-03-24 NOTE — Telephone Encounter (Signed)
Patient called left message stating she needs her needles refilled and she is almost out. Patient uses [facility]. [phone]

## 2017-03-25 ENCOUNTER — Encounter (HOSPITAL_COMMUNITY): Payer: Self-pay | Admitting: Gastroenterology

## 2017-03-31 ENCOUNTER — Encounter: Payer: Self-pay | Admitting: Family Medicine

## 2017-03-31 ENCOUNTER — Ambulatory Visit (INDEPENDENT_AMBULATORY_CARE_PROVIDER_SITE_OTHER): Payer: Medicare Other | Admitting: Family Medicine

## 2017-03-31 ENCOUNTER — Other Ambulatory Visit: Payer: Self-pay

## 2017-03-31 ENCOUNTER — Telehealth: Payer: Self-pay | Admitting: Gastroenterology

## 2017-03-31 VITALS — BP 138/82 | HR 75 | Temp 97.5°F | Resp 16 | Ht 65.0 in | Wt 204.2 lb

## 2017-03-31 DIAGNOSIS — I639 Cerebral infarction, unspecified: Secondary | ICD-10-CM | POA: Diagnosis not present

## 2017-03-31 DIAGNOSIS — E1149 Type 2 diabetes mellitus with other diabetic neurological complication: Secondary | ICD-10-CM | POA: Diagnosis not present

## 2017-03-31 DIAGNOSIS — I10 Essential (primary) hypertension: Secondary | ICD-10-CM

## 2017-03-31 MED ORDER — LOSARTAN POTASSIUM 100 MG PO TABS: 100.0000 mg | ORAL_TABLET | Freq: Every day | ORAL | 1 refills | Status: DC

## 2017-03-31 MED ORDER — "INSULIN SYRINGE-NEEDLE U-100 29G X 1/2"" 0.3 ML MISC": 0 refills | Status: DC

## 2017-03-31 NOTE — Discharge Instructions (Signed)
You have LARGE internal hemorrhoids. I PLACED 3 BANDS TO TREAT YOUR HEMORRHOIDAL BLEEDING. YOU MAY SOME MILD BLEEDING OVER THE NEXT 3 TO 5 DAYS.YOU had ONE POLYP removed AND DIVERTICULOSIS IN YOUR LEFT COLON.    PLEASE CALL [phone] IF YOU HAVE A FEVER, A LARGE AMOUNT OF BLEEDING, OR DIFFICULTY URINATING.   DRINK WATER TO KEEP URINE LIGHT YELLOW.  YOU MAY USE TYLENOL IF NEEDED FOR PAIN RELIEF.  USE MIRALAX OR COLACE ONCE TWICE DAILY TO SOFTEN STOOL AND AVOID CONSTIPATION.   YOUR BIOPSY RESULTS WILL BE AVAILABLE IN MY CHART AFTER NOV 9 AND MY OFFICE WILL CONTACT YOU IN 10-14 DAYS WITH YOUR RESULTS.     DRINK WATER TO KEEP YOUR URINE LIGHT YELLOW.  FOLLOW A HIGH FIBER DIET. AVOID ITEMS THAT CAUSE BLOATING & GAS.    FOLLOW UP IN 4 MOS.  March 5 at 11:00 with [place]     Colonoscopy Care After Read the instructions outlined below and refer to this sheet in the next week. These discharge instructions provide you with general information on caring for yourself after you leave the hospital. While your treatment has been planned according to the most current medical practices available, unavoidable complications occasionally occur. If you have any problems or questions after discharge, call DR. Kingdom Vanzanten, [phone].  ACTIVITY  You may resume your regular activity, but move at a slower pace for the next 24 hours.   Take frequent rest periods for the next 24 hours.   Walking will help get rid of the air and reduce the bloated feeling in your belly (abdomen).   No driving for 24 hours (because of the medicine (anesthesia) used during the test).   You may shower.   Do not sign any important legal documents or operate any machinery for 24 hours (because of the anesthesia used during the test).    NUTRITION  Drink plenty of fluids.   You may resume your normal diet as instructed by your doctor.   Begin with a light meal and progress to your normal diet. Heavy or fried  foods are harder to digest and may make you feel sick to your stomach (nauseated).   Avoid alcoholic beverages for 24 hours or as instructed.    MEDICATIONS  You may resume your normal medications.   WHAT YOU CAN EXPECT TODAY  Some feelings of bloating in the abdomen.   Passage of more gas than usual.   Spotting of blood in your stool or on the toilet paper  .  IF YOU HAD POLYPS REMOVED DURING THE COLONOSCOPY:  Eat a soft diet IF YOU HAVE NAUSEA, BLOATING, ABDOMINAL PAIN, OR VOMITING.    FINDING OUT THE RESULTS OF YOUR TEST Not all test results are available during your visit. DR. Oneida Alar WILL CALL YOU WITHIN 14 DAYS OF YOUR PROCEDUE WITH YOUR RESULTS. Do not assume everything is normal if you have not heard from DR. Elanora Quin, CALL HER OFFICE AT [phone].  SEEK IMMEDIATE MEDICAL ATTENTION AND CALL THE OFFICE: [phone] IF:  You have more than a spotting of blood in your stool.   Your belly is swollen (abdominal distention).   You are nauseated or vomiting.   You have a temperature over 101F.   You have abdominal pain or discomfort that is severe or gets worse throughout the day.   HEMORRHOIDAL BANDING COMPLICATIONS:  COMMON: 1. MINOR PAIN  UNCOMMON: 1. ABSCESS 2. BAND FALLS OFF 3. PROLAPSE OF HEMORRHOIDS AND PAIN 4. ULCER BLEEDING  A.  USUALLY SELF-LIMITED: MAY LAST 3-5 DAYS  B. MAY REQUIRE INTERVENTION: 1-2 WEEKS AFTER INTERACTIONS 5. NECROTIZING PELVIC SEPSIS  A. SYMPTOMS: FEVER, PAIN, DIFFICULTY URINATING   Hemorrhoids Hemorrhoids are dilated (enlarged) veins around the rectum. Sometimes clots will form in the veins. This makes them swollen and painful. These are called thrombosed hemorrhoids. Causes of hemorrhoids include:  Constipation.   Straining to have a bowel movement.   HEAVY LIFTING  HOME CARE INSTRUCTIONS  Eat a well balanced diet and drink 6 to 8 glasses of water every day to avoid constipation. You may also use a bulk laxative.    Avoid straining to have bowel movements.   Keep anal area dry and clean.   Do not use a donut shaped pillow or sit on the toilet for long periods. This increases blood pooling and pain.   Move your bowels when your body has the urge; this will require less straining and will decrease pain and pressure.

## 2017-03-31 NOTE — Telephone Encounter (Signed)
Reminder in epic °

## 2017-03-31 NOTE — Telephone Encounter (Signed)
Please call pt. She had ONE simple adenoma removed.   DRINK WATER TO KEEP URINE LIGHT YELLOW.  YOU MAY USE TYLENOL IF NEEDED FOR PAIN RELIEF.  USE MIRALAX OR COLACE ONCE TWICE DAILY TO SOFTEN STOOL AND AVOID CONSTIPATION.   DRINK WATER TO KEEP YOUR URINE LIGHT YELLOW.  FOLLOW A HIGH FIBER DIET. AVOID ITEMS THAT CAUSE BLOATING & GAS.    FOLLOW UP IN March 5 at 11:00.  NEXT TCS in 10-15 years IF THE BENEFITS OUTWEIGH THE RISKS.

## 2017-03-31 NOTE — Telephone Encounter (Signed)
Tried to call, mail box full. Mailing a letter for pt to call.

## 2017-03-31 NOTE — Progress Notes (Signed)
Patient ID: Sandra [name], female    DOB: 11/13/1940, 76 y.o.   MRN: [id number]  Chief Complaint  Patient presents with  . Follow-up  . Diabetes  . Hypertension    Allergies Hctz [hydrochlorothiazide]; Morphine and related; and Valium [diazepam]  Subjective:   Sandra [name] is a 76 y.o. female who presents to [facility] today.  HPI Patient is here for follow-up for her diabetes and her blood pressure. Patient reports she has been doing well.  Is continuing to lose weight.  Has been eating a very healthy diet.  Reports that she has her eye exam scheduled.  Denies any lesions on her feet.  Denies any hypoglycemic episodes.  Has decreased her insulin to 33 units at night.  She does report she has not been checking any blood sugars at all.  She denies feeling shaky or sweaty.  Has been eating regularly scheduled meals.  Reports that she is unable to take metformin because her brother had kidney problems and died because of the medication.  She reports she was told by her last doctor that she should never be on this medication.  Reports she has not been on any other oral agents for her blood sugar.  He is taking her simvastatin for cholesterol.  It has been on this dose for a long time but her diet has only improved over the past month.  Brings in her blood pressure monitor today to check with our office monitor.  Has been checking her blood pressure at home.  After she left here for about a week she reports it was still running high.  Reports over the past 1-2 weeks her blood pressure has been running well and less than 140.  Has been taking the amlodipine, metoprolol, and losartan as directed.  Does not want to go to the diabetic nutritionist at this time because he is dependent on her daughter-in-law to drive her and has had many appointments lately.  Had her recent colonoscopy with Dr. Oneida Alar due to rectal bleeding.  Is waiting on results.  She reports that Dr. Oneida Alar  told her that she should not have any more bleeding because of the procedure that she did.  Patient is continuing to take her aspirin because of her prior stroke.   Diabetes  She presents for her follow-up diabetic visit. She has type 2 diabetes mellitus. No MedicAlert identification noted. Her disease course has been fluctuating. Pertinent negatives for hypoglycemia include no dizziness, headaches, hunger, nervousness/anxiousness, pallor, seizures, sleepiness, sweats or tremors. Associated symptoms include weight loss. Pertinent negatives for diabetes include no blurred vision, no chest pain, no fatigue, no foot ulcerations, no polydipsia, no polyphagia, no polyuria, no visual change and no weakness. There are no hypoglycemic complications. Pertinent negatives for hypoglycemia complications include no blackouts, no hospitalization and no nocturnal hypoglycemia. Symptoms are improving. Diabetic complications include a CVA. Pertinent negatives for diabetic complications include no PVD or retinopathy. Risk factors for coronary artery disease include dyslipidemia, family history, hypertension, obesity and diabetes mellitus. Current diabetic treatment includes insulin injections. She is compliant with treatment all of the time. Her weight is decreasing steadily. She is following a diabetic and generally healthy diet. She has not had a previous visit with a dietitian. She participates in exercise intermittently.  Hypertension  Pertinent negatives include no blurred vision, chest pain, headaches, palpitations, shortness of breath or sweats. Hypertensive end-organ damage includes CVA. There is no history of PVD or retinopathy.    Past Medical  History:  Diagnosis Date  . Colon polyp   . Constipation   . Diabetes mellitus without complication ([place])   . GI bleed   . Hemorrhoids   . High cholesterol   . Hypertension   . PAD (peripheral artery disease) ([place])   . Peripheral neuropathy   . Rectal bleeding     "for 7 years"  . Rectal bleeding     Past Surgical History:  Procedure Laterality Date  . ABDOMINAL HYSTERECTOMY    . COLONOSCOPY     X 7  . COLONOSCOPY N/A 03/21/2017   Procedure: COLONOSCOPY;  Surgeon: Danie Binder, MD;  Location: [facility];  Service: Endoscopy;  Laterality: N/A;  1215     Family History  Problem Relation Age of Onset  . Diabetes Mother   . Cancer Father   . Diabetes Sister   . Diabetes Brother   . Diabetes Daughter   . Diabetes Maternal Grandmother   . Diabetes Daughter   . Colon cancer Neg Hx      Social History   Socioeconomic History  . Marital status: Widowed    Spouse name: None  . Number of children: None  . Years of education: None  . Highest education level: None  Social Needs  . Financial resource strain: None  . Food insecurity - worry: None  . Food insecurity - inability: None  . Transportation needs - medical: None  . Transportation needs - non-medical: None  Occupational History  . None  Tobacco Use  . Smoking status: Never Smoker  . Smokeless tobacco: Never Used  Substance and Sexual Activity  . Alcohol use: No  . Drug use: No  . Sexual activity: No  Other Topics Concern  . None  Social History Narrative  . None   Current Outpatient Medications on File Prior to Visit  Medication Sig Dispense Refill  . amLODipine (NORVASC) 10 MG tablet Take 1 tablet (10 mg total) by mouth daily. 90 tablet 3  . Ascorbic Acid (VITAMIN C) 1000 MG tablet Take 1,000 mg by mouth daily.    Marland Kitchen aspirin EC 81 MG tablet Take 81 mg by mouth daily.    . ergocalciferol (DRISDOL) 8000 UNIT/ML drops Take by mouth daily. Takes 2 drops daily    . hydrocortisone (ANUSOL-HC) 2.5 % rectal cream Apply rectally 2 times daily (Patient taking differently: Place 1 application rectally as needed for hemorrhoids. Apply rectally 2 times daily) 28.35 g 1  . LANTUS 100 UNIT/ML injection Inject 0.38 mLs (38 Units total) at bedtime into the skin. 10 mL 0  .  metoprolol succinate (TOPROL-XL) 100 MG 24 hr tablet Take 1 tablet (100 mg total) by mouth daily. 30 tablet 0  . Multiple Vitamins-Minerals (ONE-A-DAY 50 PLUS PO) Take 1 tablet by mouth daily.    . Omega-3 Fatty Acids (FISH OIL) 1200 MG CAPS Take 1 capsule by mouth daily.    . polyethylene glycol powder (GLYCOLAX/MIRALAX) powder Take 17 g by mouth as needed for mild constipation or moderate constipation.     . simvastatin (ZOCOR) 20 MG tablet Take 1 tablet by mouth every evening.    . vitamin B-12 (CYANOCOBALAMIN) 1000 MCG tablet Take 1,000 mcg by mouth daily.     No current facility-administered medications on file prior to visit.      Review of Systems  Constitutional: Positive for weight loss. Negative for activity change, appetite change, fatigue and fever.  HENT: Negative for nosebleeds, sore throat and trouble swallowing.  Eyes: Negative for blurred vision and visual disturbance.  Respiratory: Negative for cough, chest tightness and shortness of breath.   Cardiovascular: Negative for chest pain, palpitations and leg swelling.  Gastrointestinal: Negative for abdominal pain, blood in stool, constipation, diarrhea, nausea and vomiting.  Endocrine: Negative for polydipsia, polyphagia and polyuria.  Genitourinary: Negative for difficulty urinating, dysuria, frequency, hematuria and urgency.  Musculoskeletal: Negative for myalgias.  Skin: Negative for pallor and rash.  Neurological: Negative for dizziness, tremors, seizures, syncope, weakness, light-headedness, numbness and headaches.  Hematological: Negative for adenopathy.  Psychiatric/Behavioral: Negative for agitation, dysphoric mood, self-injury and sleep disturbance. The patient is not nervous/anxious and is not hyperactive.      Objective:   BP 138/82 (BP Location: Left Arm, Patient Position: Sitting, Cuff Size: Normal)   Pulse 75   Temp (!) 97.5 F (36.4 C) (Other (Comment))   Resp 16   Ht 5\' 5"  (1.651 m)   Wt 204 lb 4 oz  (92.6 kg)   SpO2 95%   BMI 33.99 kg/m   Physical Exam  Constitutional: She appears well-developed and well-nourished.  HENT:  Head: Normocephalic and atraumatic.  Mouth/Throat: Oropharynx is clear and moist.  Eyes: EOM are normal. Pupils are equal, round, and reactive to light. No scleral icterus.  Neck: Normal range of motion. Neck supple. No JVD present. No thyromegaly present.  Cardiovascular: Normal rate, regular rhythm and normal heart sounds.  Pulmonary/Chest: Effort normal.  Abdominal: Soft. Bowel sounds are normal.  Musculoskeletal: Normal range of motion. She exhibits no tenderness.  Lymphadenopathy:    She has no cervical adenopathy.  Skin: Skin is warm and dry.  Psychiatric: She has a normal mood and affect. Her behavior is normal. Judgment and thought content normal.  Vitals reviewed.    Assessment and Plan  1. Essential hypertension Blood pressure is improved today.  We will continue medications for blood pressure control.  Patient did have multiple pill bottles of the same medication.  Today in the office we consolidated her bottles and made a list of what each medication was for. Her monitor was checked today and was confirmed it is reading her blood pressure correctly.  She will monitor her blood pressure sporadically and bring into the next visit.  Lifestyle modifications discussed with patient including a diet emphasizing vegetables, fruits, and whole grains. Limiting intake of sodium to less than 2,400 mg per day.  Recommendations discussed include consuming low-fat dairy products, poultry, fish, legumes, non-tropical vegetable oils, and nuts; and limiting intake of sweets, sugar-sweetened beverages, and red meat. Discussed following a plan such as the Dietary Approaches to Stop Hypertension (DASH) diet. Patient to read up on this diet.    2. Type 2 diabetes mellitus with neurological complications ([place]) On insulin but no oral diabetic medications.  She is  currently losing weight and working on her diet now that she is in a stable home environment.  It was discussed in detail today that because she is on insulin that she does need to monitor her blood sugars.  She was counseled concerning when to check her blood sugars and asked to record those.  She will call our office if his blood sugars are running less than 140 and I will adjust her insulin.  We will increase her Lantus to 30 units at bedtime time.  She was counseled concerning signs and symptoms of hypoglycemia and how to respond.  Her daughter-in-law was also counseled regarding treatment of low blood sugars.  She is going to sign  paperwork today for me to get the information regarding why she should not be on metformin.  We will plan on repeating her hemoglobin A1c in 3 months and adjusting her blood sugars at her next visit. Keep diabetic eye exam appointment in February 2019.   3. Cerebrovascular accident (CVA), unspecified mechanism ([place]) Continue aspirin as directed. Current LDL at 109.  We will plan to recheck in 3 months and consider increasing medication after she has made these dietary changes.  Patient does not want her statin therapy increased at this time but wants to see if her diet has made a difference.  OV greater than 40 minutes. Greater than 50% of OV spent counseling.   Patient and her daughter-in-law were congratulated on her weight loss and her healthy eating.  She was encouraged to do some exercise by walking as tolerated. Return for Blood pressure. Caren Macadam, MD 03/31/2017

## 2017-04-11 NOTE — Telephone Encounter (Signed)
Pt called office and was informed of TCS results. She is aware of OV 07/29/17.

## 2017-04-18 ENCOUNTER — Telehealth: Payer: Self-pay | Admitting: Family Medicine

## 2017-04-18 NOTE — Telephone Encounter (Signed)
Patient left message on nurse line stating she saw on tv that patients taking amlodipine need to contact their doctors to see if they still need to be taking it- risk of cancer

## 2017-04-19 NOTE — Telephone Encounter (Signed)
Please advise that this  Medication does not put [name] at risk for cancer. We can discuss more in detail at [place]. Please continue. Sandra [name]. [name], MD

## 2017-04-19 NOTE — Telephone Encounter (Signed)
Called patient regarding message below. No answer, left generic message for patient to return call.   

## 2017-05-05 ENCOUNTER — Ambulatory Visit: Payer: Medicare Other | Admitting: Family Medicine

## 2017-05-13 ENCOUNTER — Ambulatory Visit: Payer: Medicare Other | Admitting: Family Medicine

## 2017-05-18 ENCOUNTER — Other Ambulatory Visit: Payer: Self-pay | Admitting: Family Medicine

## 2017-05-18 DIAGNOSIS — E1149 Type 2 diabetes mellitus with other diabetic neurological complication: Secondary | ICD-10-CM

## 2017-05-24 ENCOUNTER — Other Ambulatory Visit: Payer: Self-pay

## 2017-05-24 ENCOUNTER — Encounter: Payer: Self-pay | Admitting: Family Medicine

## 2017-05-24 ENCOUNTER — Ambulatory Visit (INDEPENDENT_AMBULATORY_CARE_PROVIDER_SITE_OTHER): Payer: Medicare Other | Admitting: Family Medicine

## 2017-05-24 VITALS — BP 160/82 | HR 97 | Temp 98.0°F | Resp 16 | Ht 64.5 in | Wt 203.4 lb

## 2017-05-24 DIAGNOSIS — I10 Essential (primary) hypertension: Secondary | ICD-10-CM

## 2017-05-24 DIAGNOSIS — R5383 Other fatigue: Secondary | ICD-10-CM

## 2017-05-24 DIAGNOSIS — E1149 Type 2 diabetes mellitus with other diabetic neurological complication: Secondary | ICD-10-CM | POA: Diagnosis not present

## 2017-05-24 DIAGNOSIS — I639 Cerebral infarction, unspecified: Secondary | ICD-10-CM | POA: Diagnosis not present

## 2017-05-24 DIAGNOSIS — F321 Major depressive disorder, single episode, moderate: Secondary | ICD-10-CM | POA: Diagnosis not present

## 2017-05-24 MED ORDER — CITALOPRAM HYDROBROMIDE 10 MG PO TABS: 10.0000 mg | ORAL_TABLET | Freq: Every day | ORAL | 0 refills | Status: DC

## 2017-05-24 MED ORDER — "INSULIN SYRINGE 29G X 1/2"" 0.3 ML MISC": 3 refills | Status: DC

## 2017-05-24 NOTE — Progress Notes (Signed)
Patient ID: Sandra [name], female    DOB: 04/26/1941, 77 y.o.   MRN: [id number]  Chief Complaint  Patient presents with  . Follow-up    Allergies Hctz [hydrochlorothiazide]; Morphine and related; and Valium [diazepam]  Subjective:   Sandra [name] is a 77 y.o. female who presents to [facility] today.  HPI Here for follow up.  She reports that she had a lot of loss in the past year.  Reports that she has had 8 family members who have died.  Reports that she did not have a great Christmas her new year due to this fact.    Reports that her blood sugars ranging from 97-200 fasting in the morning.  Does not check her sugars any time other than the fasting sugar in the morning.  No hypoglycemia. Uses the lantus 30 units at bedtime. Does not wake up with low blood sugars.  Eating regularly scheduled meals but reports has to make herself eat.  Reports that has not been taking all of BP medications b/c she was not sure if she was supposed to take them all. Heard that a BP pill could cause cancer so quit the norvasc several weeks ago.  Denies any chest pain, shortness of breath, or swelling in her extremities.  Denies any dizziness or lightheadedness.  No syncope.  Ports that her mood is not good and she feels that she is depressed.  Sleeps during the day b/c watches tv and doses off. Feels sad and down. Does not feel like eating much. Has to make self sleep. Has never been on medication.  Reports that she would not hurt self. Energy low. Does feels sad. Would like a medication for mood if would make her feel better. Enjoys where she is living now with son and his family. Was living in [place] with niece and her sister. Had lived in sisters house and was not treated very well. Has had many people in family die. Niece was stealing money for her and her sister. Would not hurtself b/c of strong faith. Believes at times would be better off dead but no plan to hurt self. Feels like this  is an evil world.  Reports that all the bad things that happened in the world can tend to make her sad.  Feels safe in her current living situation.  No emotional or physical abuse.  Has never been treated for her mood in the past.  Never been on any medication for depression or anxiety.  Does not feel that she worries a lot.  Denies any auditory or visual hallucinations.  Reports her bowel movements are better.  Does not feel constipated.  Diabetes  She presents for her follow-up diabetic visit. She has type 2 diabetes mellitus. No MedicAlert identification noted. Her disease course has been fluctuating. There are no hypoglycemic associated symptoms. Pertinent negatives for hypoglycemia include no confusion, dizziness, headaches, hunger, nervousness/anxiousness, seizures, speech difficulty, sweats or tremors. Associated symptoms include fatigue. Pertinent negatives for diabetes include no blurred vision, no chest pain, no foot paresthesias, no foot ulcerations, no polydipsia, no polyphagia, no polyuria, no visual change and no weakness. There are no hypoglycemic complications. Pertinent negatives for hypoglycemia complications include no hospitalization and no required assistance. Symptoms are improving. Diabetic complications include a CVA. Risk factors for coronary artery disease include diabetes mellitus, dyslipidemia, family history, obesity, hypertension, post-menopausal, sedentary lifestyle and stress. Current diabetic treatment includes insulin injections. She is compliant with treatment all of the time. Her weight  is stable. She is following a diabetic and generally healthy diet. Meal planning includes avoidance of concentrated sweets. She has had a previous visit with a dietitian. She participates in exercise intermittently. Her breakfast blood glucose is taken between 6-7 am. Her breakfast blood glucose range is generally 110-130 mg/dl. Her overall blood glucose range is 130-140 mg/dl. An ACE  inhibitor/angiotensin II receptor blocker is being taken. She sees a podiatrist.Eye exam is current.  Depression         The patient presents with no depression.  This is a new problem.  The current episode started more than 1 month ago.   The onset quality is gradual.   The problem occurs daily.  The problem has been gradually worsening since onset.  Associated symptoms include fatigue, hopelessness, insomnia, decreased interest, appetite change and sad.  Associated symptoms include no decreased concentration, no body aches, no myalgias, no headaches and no suicidal ideas.     The symptoms are aggravated by family issues.  Past treatments include nothing.  Risk factors include major life event, stress and prior traumatic experience.   Past medical history includes chronic illness.     Pertinent negatives include no anxiety, no bipolar disorder, no depression, no mental health disorder and no suicide attempts.   Past Medical History:  Diagnosis Date  . Colon polyp   . Constipation   . Diabetes mellitus without complication ([place])   . GI bleed   . Hemorrhoids   . High cholesterol   . Hypertension   . PAD (peripheral artery disease) ([place])   . Peripheral neuropathy   . Rectal bleeding    "for 7 years"  . Rectal bleeding     Past Surgical History:  Procedure Laterality Date  . ABDOMINAL HYSTERECTOMY    . COLONOSCOPY     X 7  . COLONOSCOPY N/A 03/21/2017   Procedure: COLONOSCOPY;  Surgeon: Danie Binder, MD;  Location: [facility];  Service: Endoscopy;  Laterality: N/A;  1215     Family History  Problem Relation Age of Onset  . Diabetes Mother   . Cancer Father   . Diabetes Sister   . Diabetes Brother   . Diabetes Daughter   . Diabetes Maternal Grandmother   . Diabetes Daughter   . Colon cancer Neg Hx      Social History   Socioeconomic History  . Marital status: Widowed    Spouse name: None  . Number of children: None  . Years of education: None  . Highest education  level: None  Social Needs  . Financial resource strain: None  . Food insecurity - worry: None  . Food insecurity - inability: None  . Transportation needs - medical: None  . Transportation needs - non-medical: None  Occupational History  . None  Tobacco Use  . Smoking status: Never Smoker  . Smokeless tobacco: Never Used  Substance and Sexual Activity  . Alcohol use: No  . Drug use: No  . Sexual activity: No  Other Topics Concern  . None  Social History Narrative  . None   Current Outpatient Medications on File Prior to Visit  Medication Sig Dispense Refill  . amLODipine (NORVASC) 10 MG tablet Take 1 tablet (10 mg total) by mouth daily. 90 tablet 3  . Ascorbic Acid (VITAMIN C) 1000 MG tablet Take 1,000 mg by mouth daily.    Marland Kitchen aspirin EC 81 MG tablet Take 81 mg by mouth daily.    . ergocalciferol (DRISDOL) 8000  UNIT/ML drops Take by mouth daily. Takes 2 drops daily    . LANTUS 100 UNIT/ML injection Inject 0.38 mLs (38 Units total) at bedtime into the skin. 10 mL 0  . losartan (COZAAR) 100 MG tablet Take 1 tablet (100 mg total) daily by mouth. 90 tablet 1  . Multiple Vitamins-Minerals (ONE-A-DAY 50 PLUS PO) Take 1 tablet by mouth daily.    . Omega-3 Fatty Acids (FISH OIL) 1200 MG CAPS Take 1 capsule by mouth daily.    . simvastatin (ZOCOR) 20 MG tablet Take 1 tablet by mouth every evening.    . vitamin B-12 (CYANOCOBALAMIN) 1000 MCG tablet Take 1,000 mcg by mouth daily.    . hydrocortisone (ANUSOL-HC) 2.5 % rectal cream Apply rectally 2 times daily (Patient not taking: Reported on 05/24/2017) 28.35 g 1  . metoprolol succinate (TOPROL-XL) 100 MG 24 hr tablet TAKE 1 TABLET BY MOUTH DAILY (Patient not taking: Reported on 05/24/2017) 30 tablet 0  . polyethylene glycol powder (GLYCOLAX/MIRALAX) powder Take 17 g by mouth as needed for mild constipation or moderate constipation.      No current facility-administered medications on file prior to visit.      Review of Systems    Constitutional: Positive for appetite change and fatigue.  HENT: Negative for trouble swallowing.   Eyes: Negative for blurred vision and visual disturbance.  Respiratory: Negative for cough, chest tightness, shortness of breath and wheezing.   Cardiovascular: Negative for chest pain.  Gastrointestinal: Negative for abdominal pain, blood in stool, constipation, diarrhea, nausea and rectal pain.  Endocrine: Negative for polydipsia, polyphagia and polyuria.  Genitourinary: Negative for dysuria.  Musculoskeletal: Negative for myalgias.  Skin: Negative for rash.  Neurological: Negative for dizziness, tremors, seizures, speech difficulty, weakness and headaches.  Hematological: Negative for adenopathy. Does not bruise/bleed easily.  Psychiatric/Behavioral: Positive for depression, dysphoric mood and sleep disturbance. Negative for behavioral problems, confusion, decreased concentration, hallucinations, self-injury and suicidal ideas. The patient has insomnia. The patient is not nervous/anxious.      Objective:   BP (!) 160/82 (BP Location: Left Arm, Patient Position: Sitting, Cuff Size: Normal)   Pulse 97   Temp 98 F (36.7 C) (Temporal)   Resp 16   Ht 5' 4.5" (1.638 m)   Wt 203 lb 6.4 oz (92.3 kg)   SpO2 96%   BMI 34.37 kg/m   Physical Exam  Constitutional: She is oriented to person, place, and time.  HENT:  Head: Normocephalic and atraumatic.  Eyes: EOM are normal. Pupils are equal, round, and reactive to light.  Neck: Normal range of motion. Neck supple. No JVD present. No thyromegaly present.  Cardiovascular: Normal rate, regular rhythm and normal heart sounds.  No murmur heard. Pulmonary/Chest: Effort normal and breath sounds normal.  Neurological: She is alert and oriented to person, place, and time.  Skin: Skin is warm.  Psychiatric: Her behavior is normal. Judgment and thought content normal. Her affect is blunt. Her speech is delayed. Thought content is not paranoid and  not delusional. Cognition and memory are normal. Cognition and memory are not impaired. She exhibits a depressed mood. She expresses no homicidal and no suicidal ideation. She expresses no suicidal plans and no homicidal plans. She exhibits normal recent memory and normal remote memory.  Vitals reviewed.  Depression screen PHQ 2/9 05/24/2017  Decreased Interest 2  Down, Depressed, Hopeless 3  PHQ - 2 Score 5  Altered sleeping 2  Tired, decreased energy 3  Change in appetite 0  Feeling bad  or failure about yourself  0  Trouble concentrating 0  Moving slowly or fidgety/restless 0  Suicidal thoughts 3  PHQ-9 Score 13     Assessment and Plan  1. Depression, major, single episode, moderate ([place])  Patient is present with her daughter-in-law for visit today.  Patient wishes a trial of medication to help her mood.  Patient's daughter-in-law does not believe that she needs medication.  We had long discussion today as to the reasons why I believe that patient needs to have a trial of medication.  We discussed patient's PHQ 9 score and the symptoms that she was experiencing.  We also discussed her risks if her mood did not improve.  Daughter in law reports that patient cannot make her own decision about the medication.  Patient does wish to try medication at this time.  We discussed Celexa its risk, benefits, mechanism of action and possible side effects.  She was told to call with any questions or concerns.  She is to follow-up in 3 weeks or sooner if needed.  She does not wish for therapy at this time but would like to try the medication first. Suicide risks evaluated and documented in note if present or in the area below.  Patient does not have/denies the following risks: previous suicide attempts, family history of suicide, access to lethal means, prior history of psychiatric disorder, history of alcohol or substance abuse disorder, or severe hopelessness. Patient has protective factors of family and  community support.  Patient reports that family believes is behaving rationally. Patient displays problem solving skills.   Patient specifically denies suicide ideation. Patient has access/information to healthcare contacts if situation or mood changes where patient is a risk to self or others or mood becomes unstable.   During the encounter, the patient had good eye contact and firm handshake regarding safety contract and agreement to seek help if mood worsens and not to harm self.   Patient understands the treatment plan and is in agreement. Agrees to keep follow up and call prior or return to clinic if needed.   - citalopram (CELEXA) 10 MG tablet; Take 1 tablet (10 mg total) by mouth daily.  Dispense: 30 tablet; Refill: 0 Patient counseled in detail regarding the risks of medication. Told to call or return to clinic if develop any worrisome signs or symptoms. Patient voiced understanding.   2. Fatigue, unspecified type Effect secondary to mood disorder. 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  She is to check blood sugars before giving herself insulin.  In addition she was asked to check some sugars other than just the morning fasting.  We did discuss diet.  I do believe that she is following her diet as recommended. - Insulin Syringe-Needle U-100 (INSULIN SYRINGE .3CC/29GX1/2") 29G X 1/2" 0.3 ML MISC; PLEASE USE AS DIRECTED ONCE A DAY EVERY NIGHT AT BEDTIME  Dispense: 100 each; Refill: 3  4. Essential hypertension She was counseled to restart her medications as directed and we would check her blood pressure at follow-up.  She was asked to please not discontinue medications without calling our office first.  5. Cerebrovascular accident (CVA), unspecified mechanism ([place]) Continue aspirin each day as directed.  Continue risk factor modification.  Plan on rechecking lipids in approximately 2 months to ensure  that LDL has decreased to goal.  Return in about 3 weeks (around 06/14/2017) for follw up. Caren Macadam, MD 05/24/2017

## 2017-05-25 ENCOUNTER — Other Ambulatory Visit: Payer: Self-pay | Admitting: Family Medicine

## 2017-05-30 ENCOUNTER — Telehealth: Payer: Self-pay | Admitting: Family Medicine

## 2017-05-30 DIAGNOSIS — I152 Hypertension secondary to endocrine disorders: Secondary | ICD-10-CM

## 2017-05-30 DIAGNOSIS — E1149 Type 2 diabetes mellitus with other diabetic neurological complication: Secondary | ICD-10-CM

## 2017-05-30 DIAGNOSIS — E1159 Type 2 diabetes mellitus with other circulatory complications: Secondary | ICD-10-CM

## 2017-05-30 DIAGNOSIS — I10 Essential (primary) hypertension: Principal | ICD-10-CM

## 2017-05-30 MED ORDER — "INSULIN SYRINGE 29G X 1/2"" 0.3 ML MISC": 3 refills | Status: DC

## 2017-05-30 MED ORDER — SIMVASTATIN 20 MG PO TABS: 20.0000 mg | ORAL_TABLET | Freq: Every evening | ORAL | 0 refills | Status: DC

## 2017-05-30 MED ORDER — METOPROLOL SUCCINATE ER 100 MG PO TB24: 100.0000 mg | ORAL_TABLET | Freq: Every day | ORAL | 0 refills | Status: DC

## 2017-05-30 NOTE — Telephone Encounter (Signed)
Done

## 2017-05-30 NOTE — Telephone Encounter (Signed)
Patient is requesting refill for insulin needles, simvastatin for high cholesterol 20mg , and toprol xl tablets 100mg .  Pharmacy: [facility] in [place]  Cb#: [phone]

## 2017-06-01 ENCOUNTER — Encounter: Payer: Self-pay | Admitting: Gastroenterology

## 2017-06-14 ENCOUNTER — Encounter: Payer: Self-pay | Admitting: Family Medicine

## 2017-06-14 ENCOUNTER — Ambulatory Visit (INDEPENDENT_AMBULATORY_CARE_PROVIDER_SITE_OTHER): Payer: Medicare Other | Admitting: Family Medicine

## 2017-06-14 ENCOUNTER — Telehealth: Payer: Self-pay | Admitting: Family Medicine

## 2017-06-14 ENCOUNTER — Other Ambulatory Visit: Payer: Self-pay

## 2017-06-14 VITALS — BP 176/100 | HR 66 | Temp 98.0°F | Resp 16 | Ht 65.0 in | Wt 198.5 lb

## 2017-06-14 DIAGNOSIS — E1149 Type 2 diabetes mellitus with other diabetic neurological complication: Secondary | ICD-10-CM

## 2017-06-14 DIAGNOSIS — E1169 Type 2 diabetes mellitus with other specified complication: Secondary | ICD-10-CM | POA: Diagnosis not present

## 2017-06-14 DIAGNOSIS — E785 Hyperlipidemia, unspecified: Secondary | ICD-10-CM | POA: Diagnosis not present

## 2017-06-14 DIAGNOSIS — Z23 Encounter for immunization: Secondary | ICD-10-CM | POA: Diagnosis not present

## 2017-06-14 DIAGNOSIS — I10 Essential (primary) hypertension: Secondary | ICD-10-CM | POA: Diagnosis not present

## 2017-06-14 DIAGNOSIS — Z780 Asymptomatic menopausal state: Secondary | ICD-10-CM | POA: Diagnosis not present

## 2017-06-14 MED ORDER — "INSULIN SYRINGE-NEEDLE U-100 29G X 1/2"" 1 ML MISC": 1.0000 | 0 refills | Status: DC

## 2017-06-14 MED ORDER — OLMESARTAN MEDOXOMIL 40 MG PO TABS: 40.0000 mg | ORAL_TABLET | Freq: Every day | ORAL | 1 refills | Status: DC

## 2017-06-14 NOTE — Progress Notes (Signed)
Patient ID: Sandra [name], female    DOB: [birth year], 77 y.o.   MRN: [id number]  Chief Complaint  Patient presents with  . Follow-up    Allergies Hctz [hydrochlorothiazide]; Morphine and related; and Valium [diazepam]  Subjective:   Sandra [name] is a 77 y.o. female who presents to [facility] today.  HPI Here for follow up. Reports that quit all of the medications several weeks ago. She "talked with the Reita Cliche and said that she was not taking them anymore b/c she did not feel well on the medications".  He quit all medications except for her insulin.  Has not taken any of her blood pressure medicines, cholesterol medicines, or medication for mood.  She reports that she feels much better after stopping these medications.  She denies any chest pain, shortness of breath, or swelling in her extremities.  She has still continue to use her insulin and her blood sugars are running well.  Reports her energy level is better.  Her mood is good.  She denies any numbness or tingling in her extremities.  After long discussion about medications and her risk of stroke with elevated blood pressure she reports she would be willing to try medication that she has never been on before.  She reports she has been on a lot of medications over the years for blood pressure but they never really seem to work.  We did discuss the fact that her blood pressure medicines were working some because her pressure is a good bit higher today than it has been in the past.  She denies any numbness or tingling in her extremities.  She denies any weakness in her upper or lower extremities.  She denies any palpitations.  She reports that she always felt sluggish and nauseated on her medicines and she feels much better now.    Past Medical History:  Diagnosis Date  . Colon polyp   . Constipation   . Diabetes mellitus without complication ([place])   . GI bleed   . Hemorrhoids   . High cholesterol   . Hypertension     . PAD (peripheral artery disease) ([place])   . Peripheral neuropathy   . Rectal bleeding    "for 7 years"  . Rectal bleeding     Past Surgical History:  Procedure Laterality Date  . ABDOMINAL HYSTERECTOMY    . COLONOSCOPY     X 7  . COLONOSCOPY N/A 03/21/2017   Procedure: COLONOSCOPY;  Surgeon: Danie Binder, MD;  Location: [facility];  Service: Endoscopy;  Laterality: N/A;  1215     Family History  Problem Relation Age of Onset  . Diabetes Mother   . Cancer Father   . Diabetes Sister   . Diabetes Brother   . Diabetes Daughter   . Diabetes Maternal Grandmother   . Diabetes Daughter   . Colon cancer Neg Hx      Social History   Socioeconomic History  . Marital status: Widowed    Spouse name: None  . Number of children: None  . Years of education: None  . Highest education level: None  Social Needs  . Financial resource strain: None  . Food insecurity - worry: None  . Food insecurity - inability: None  . Transportation needs - medical: None  . Transportation needs - non-medical: None  Occupational History  . None  Tobacco Use  . Smoking status: Never Smoker  . Smokeless tobacco: Never Used  Substance and Sexual Activity  .  Alcohol use: No  . Drug use: No  . Sexual activity: No  Other Topics Concern  . None  Social History Narrative  . None   Current Outpatient Medications on File Prior to Visit  Medication Sig Dispense Refill  . Ascorbic Acid (VITAMIN C) 1000 MG tablet Take 1,000 mg by mouth daily.    Marland Kitchen aspirin EC 81 MG tablet Take 81 mg by mouth daily.    . ergocalciferol (DRISDOL) 8000 UNIT/ML drops Take by mouth daily. Takes 2 drops daily    . LANTUS 100 UNIT/ML injection Inject 0.38 mLs (38 Units total) at bedtime into the skin. 10 mL 0  . Multiple Vitamins-Minerals (ONE-A-DAY 50 PLUS PO) Take 1 tablet by mouth daily.    . Omega-3 Fatty Acids (FISH OIL) 1200 MG CAPS Take 1 capsule by mouth daily.    . polyethylene glycol powder (GLYCOLAX/MIRALAX)  powder Take 17 g by mouth as needed for mild constipation or moderate constipation.     . vitamin B-12 (CYANOCOBALAMIN) 1000 MCG tablet Take 1,000 mcg by mouth daily.    . hydrocortisone (ANUSOL-HC) 2.5 % rectal cream Apply rectally 2 times daily (Patient not taking: Reported on 05/24/2017) 28.35 g 1  . metoprolol succinate (TOPROL-XL) 100 MG 24 hr tablet Take 1 tablet (100 mg total) by mouth daily. Take with or immediately following a meal. (Patient not taking: Reported on 06/14/2017) 30 tablet 0   No current facility-administered medications on file prior to visit.     Review of Systems   Objective:   BP (!) 176/100 (BP Location: Left Arm, Patient Position: Sitting, Cuff Size: Normal)   Pulse 66   Temp 98 F (36.7 C) (Temporal)   Resp 16   Ht 5\' 5"  (1.651 m)   Wt 198 lb 8 oz (90 kg)   SpO2 97%   BMI 33.03 kg/m   Physical Exam  Constitutional: She is oriented to person, place, and time. She appears well-developed and well-nourished.  HENT:  Head: Normocephalic and atraumatic.  Neck: Normal range of motion. Neck supple.  Cardiovascular: Normal rate, regular rhythm and normal heart sounds.  Pulses:      Dorsalis pedis pulses are 1+ on the right side, and 1+ on the left side.       Posterior tibial pulses are 1+ on the right side, and 1+ on the left side.  Pulmonary/Chest: Breath sounds normal. No respiratory distress.  Musculoskeletal:       Right foot: There is normal range of motion and no deformity.       Left foot: There is normal range of motion and no deformity.  Feet:  Right Foot:  Protective Sensation: 7 sites tested. 7 sites sensed.  Skin Integrity: Positive for callus and dry skin. Negative for ulcer, blister, skin breakdown, erythema or warmth.  Left Foot:  Protective Sensation: 7 sites tested. 7 sites sensed.  Skin Integrity: Positive for callus and dry skin. Negative for ulcer, blister, skin breakdown, erythema or warmth.  Neurological: She is alert and oriented  to person, place, and time. No cranial nerve deficit.  Skin: Skin is warm and dry. Capillary refill takes less than 2 seconds.  Psychiatric: She has a normal mood and affect. Her behavior is normal. Judgment and thought content normal.  Vitals reviewed.    Assessment and Plan  1. Essential hypertension Long discussion today with patient and her daughter-in-law that her blood pressure being uncontrolled and is elevated is today does increase her risk of heart attack  and stroke.  We did discuss that being on blood pressure lowering medication did decrease her risk of stroke.  After discussion she is willing to do a trial of the medication that she has never taken in the past.  She will start Benicar 40 mg, 1 p.o. daily.  She will plan to recheck her blood pressure in 1 month.  She will also institute-diet plan to help with blood pressure lowering.  She was counseled concerning worrisome signs and symptoms of stroke and if develop she will go to the emergency department and contact medical help. - olmesartan (BENICAR) 40 MG tablet; Take 1 tablet (40 mg total) by mouth daily.  Dispense: 30 tablet; Refill: 1  2. Post-menopausal Patient is continuing her calcium and vitamin D.  She wishes to proceed with a bone density at this time.  Will place order today. - DG Bone Density; Future  3. Type 2 diabetes mellitus with neurological complications ([facility]) Patient's blood sugars are under good control.  She has not had any hypoglycemic episodes since the decrease of her insulin.  We will plan to recheck her hemoglobin A1c in 2 months.  She has agreed to continue her insulin.  4. Hyperlipidemia associated with type 2 diabetes mellitus ([place]) Patient wishes at this time to stop her statin medication.  She reports that it makes her feel terrible and she refuses to be on any medication.  She understands that this medication does help decrease her cardiovascular risk.  However, she reports she will not take this  medication.  She understands the risks versus benefits of this medication and is competent to make her own decision.  5. Immunization due We will plan to give patient TD once we have received the vaccination. We are out of stock at this time.  Will consider at her next visit.  Return in about 4 weeks (around 07/12/2017) for BP check. Caren Macadam, MD 06/14/2017

## 2017-06-14 NOTE — Patient Instructions (Signed)
DASH Eating Plan DASH stands for "Dietary Approaches to Stop Hypertension." The DASH eating plan is a healthy eating plan that has been shown to reduce high blood pressure (hypertension). It may also reduce your risk for type 2 diabetes, heart disease, and stroke. The DASH eating plan may also help with weight loss. What are tips for following this plan? General guidelines  Avoid eating more than 2,300 mg (milligrams) of salt (sodium) a day. If you have hypertension, you may need to reduce your sodium intake to 1,500 mg a day.  Limit alcohol intake to no more than 1 drink a day for nonpregnant women and 2 drinks a day for men. One drink equals 12 oz of beer, 5 oz of wine, or 1 oz of hard liquor.  Work with your health care provider to maintain a healthy body weight or to lose weight. Ask what an ideal weight is for you.  Get at least 30 minutes of exercise that causes your heart to beat faster (aerobic exercise) most days of the week. Activities may include walking, swimming, or biking.  Work with your health care provider or diet and nutrition specialist (dietitian) to adjust your eating plan to your individual calorie needs. Reading food labels  Check food labels for the amount of sodium per serving. Choose foods with less than 5 percent of the Daily Value of sodium. Generally, foods with less than 300 mg of sodium per serving fit into this eating plan.  To find whole grains, look for the word "whole" as the first word in the ingredient list. Shopping  Buy products labeled as "low-sodium" or "no salt added."  Buy fresh foods. Avoid canned foods and premade or frozen meals. Cooking  Avoid adding salt when cooking. Use salt-free seasonings or herbs instead of table salt or sea salt. Check with your health care provider or pharmacist before using salt substitutes.  Do not fry foods. Cook foods using healthy methods such as baking, boiling, grilling, and broiling instead.  Cook with  heart-healthy oils, such as olive, canola, soybean, or sunflower oil. Meal planning   Eat a balanced diet that includes: ? 5 or more servings of fruits and vegetables each day. At each meal, try to fill half of your plate with fruits and vegetables. ? Up to 6-8 servings of whole grains each day. ? Less than 6 oz of lean meat, poultry, or fish each day. A 3-oz serving of meat is about the same size as a deck of cards. One egg equals 1 oz. ? 2 servings of low-fat dairy each day. ? A serving of nuts, seeds, or beans 5 times each week. ? Heart-healthy fats. Healthy fats called Omega-3 fatty acids are found in foods such as flaxseeds and coldwater fish, like sardines, salmon, and mackerel.  Limit how much you eat of the following: ? Canned or prepackaged foods. ? Food that is high in trans fat, such as fried foods. ? Food that is high in saturated fat, such as fatty meat. ? Sweets, desserts, sugary drinks, and other foods with added sugar. ? Full-fat dairy products.  Do not salt foods before eating.  Try to eat at least 2 vegetarian meals each week.  Eat more home-cooked food and less restaurant, buffet, and fast food.  When eating at a restaurant, ask that your food be prepared with less salt or no salt, if possible. What foods are recommended? The items listed may not be a complete list. Talk with your dietitian about what   dietary choices are best for you. Grains Whole-grain or whole-wheat bread. Whole-grain or whole-wheat pasta. Brown rice. Oatmeal. Quinoa. Bulgur. Whole-grain and low-sodium cereals. Pita bread. Low-fat, low-sodium crackers. Whole-wheat flour tortillas. Vegetables Fresh or frozen vegetables (raw, steamed, roasted, or grilled). Low-sodium or reduced-sodium tomato and vegetable juice. Low-sodium or reduced-sodium tomato sauce and tomato paste. Low-sodium or reduced-sodium canned vegetables. Fruits All fresh, dried, or frozen fruit. Canned fruit in natural juice (without  added sugar). Meat and other protein foods Skinless chicken or turkey. Ground chicken or turkey. Pork with fat trimmed off. Fish and seafood. Egg whites. Dried beans, peas, or lentils. Unsalted nuts, nut butters, and seeds. Unsalted canned beans. Lean cuts of beef with fat trimmed off. Low-sodium, lean deli meat. Dairy Low-fat (1%) or fat-free (skim) milk. Fat-free, low-fat, or reduced-fat cheeses. Nonfat, low-sodium ricotta or cottage cheese. Low-fat or nonfat yogurt. Low-fat, low-sodium cheese. Fats and oils Soft margarine without trans fats. Vegetable oil. Low-fat, reduced-fat, or light mayonnaise and salad dressings (reduced-sodium). Canola, safflower, olive, soybean, and sunflower oils. Avocado. Seasoning and other foods Herbs. Spices. Seasoning mixes without salt. Unsalted popcorn and pretzels. Fat-free sweets. What foods are not recommended? The items listed may not be a complete list. Talk with your dietitian about what dietary choices are best for you. Grains Baked goods made with fat, such as croissants, muffins, or some breads. Dry pasta or rice meal packs. Vegetables Creamed or fried vegetables. Vegetables in a cheese sauce. Regular canned vegetables (not low-sodium or reduced-sodium). Regular canned tomato sauce and paste (not low-sodium or reduced-sodium). Regular tomato and vegetable juice (not low-sodium or reduced-sodium). Pickles. Olives. Fruits Canned fruit in a light or heavy syrup. Fried fruit. Fruit in cream or butter sauce. Meat and other protein foods Fatty cuts of meat. Ribs. Fried meat. Bacon. Sausage. Bologna and other processed lunch meats. Salami. Fatback. Hotdogs. Bratwurst. Salted nuts and seeds. Canned beans with added salt. Canned or smoked fish. Whole eggs or egg yolks. Chicken or turkey with skin. Dairy Whole or 2% milk, cream, and half-and-half. Whole or full-fat cream cheese. Whole-fat or sweetened yogurt. Full-fat cheese. Nondairy creamers. Whipped toppings.  Processed cheese and cheese spreads. Fats and oils Butter. Stick margarine. Lard. Shortening. Ghee. Bacon fat. Tropical oils, such as coconut, palm kernel, or palm oil. Seasoning and other foods Salted popcorn and pretzels. Onion salt, garlic salt, seasoned salt, table salt, and sea salt. Worcestershire sauce. Tartar sauce. Barbecue sauce. Teriyaki sauce. Soy sauce, including reduced-sodium. Steak sauce. Canned and packaged gravies. Fish sauce. Oyster sauce. Cocktail sauce. Horseradish that you find on the shelf. Ketchup. Mustard. Meat flavorings and tenderizers. Bouillon cubes. Hot sauce and Tabasco sauce. Premade or packaged marinades. Premade or packaged taco seasonings. Relishes. Regular salad dressings. Where to find more information:  National Heart, Lung, and Blood Institute: www.nhlbi.nih.gov  American Heart Association: www.heart.org Summary  The DASH eating plan is a healthy eating plan that has been shown to reduce high blood pressure (hypertension). It may also reduce your risk for type 2 diabetes, heart disease, and stroke.  With the DASH eating plan, you should limit salt (sodium) intake to 2,300 mg a day. If you have hypertension, you may need to reduce your sodium intake to 1,500 mg a day.  When on the DASH eating plan, aim to eat more fresh fruits and vegetables, whole grains, lean proteins, low-fat dairy, and heart-healthy fats.  Work with your health care provider or diet and nutrition specialist (dietitian) to adjust your eating plan to your individual   calorie needs. This information is not intended to replace advice given to you by your health care provider. Make sure you discuss any questions you have with your health care provider. Document Released: 04/22/2011 Document Revised: 04/26/2016 Document Reviewed: 04/26/2016 Elsevier Interactive Patient Education  2018 Elsevier Inc.  

## 2017-06-14 NOTE — Telephone Encounter (Signed)
Patient states [facility] did not have correct meter and they tried to "push another meter " on her  Cb#: [phone]

## 2017-06-15 ENCOUNTER — Other Ambulatory Visit: Payer: Self-pay

## 2017-06-15 ENCOUNTER — Encounter (HOSPITAL_COMMUNITY): Payer: Self-pay | Admitting: Emergency Medicine

## 2017-06-15 ENCOUNTER — Inpatient Hospital Stay (HOSPITAL_COMMUNITY)
Admission: EM | Admit: 2017-06-15 | Discharge: 2017-06-20 | DRG: 064 | Disposition: A | Discharge status: Rehabilitation Facility | Payer: Medicare Other | Attending: Family Medicine | Admitting: Family Medicine

## 2017-06-15 ENCOUNTER — Emergency Department (HOSPITAL_COMMUNITY): Payer: Medicare Other

## 2017-06-15 DIAGNOSIS — E1149 Type 2 diabetes mellitus with other diabetic neurological complication: Secondary | ICD-10-CM | POA: Diagnosis present

## 2017-06-15 DIAGNOSIS — N179 Acute kidney failure, unspecified: Secondary | ICD-10-CM | POA: Diagnosis present

## 2017-06-15 DIAGNOSIS — I69398 Other sequelae of cerebral infarction: Secondary | ICD-10-CM | POA: Diagnosis not present

## 2017-06-15 DIAGNOSIS — R29705 NIHSS score 5: Secondary | ICD-10-CM | POA: Diagnosis present

## 2017-06-15 DIAGNOSIS — E1151 Type 2 diabetes mellitus with diabetic peripheral angiopathy without gangrene: Secondary | ICD-10-CM | POA: Diagnosis present

## 2017-06-15 DIAGNOSIS — Z79899 Other long term (current) drug therapy: Secondary | ICD-10-CM

## 2017-06-15 DIAGNOSIS — E669 Obesity, unspecified: Secondary | ICD-10-CM | POA: Diagnosis present

## 2017-06-15 DIAGNOSIS — R202 Paresthesia of skin: Secondary | ICD-10-CM | POA: Diagnosis not present

## 2017-06-15 DIAGNOSIS — Z9119 Patient's noncompliance with other medical treatment and regimen: Secondary | ICD-10-CM | POA: Diagnosis not present

## 2017-06-15 DIAGNOSIS — J690 Pneumonitis due to inhalation of food and vomit: Secondary | ICD-10-CM | POA: Diagnosis present

## 2017-06-15 DIAGNOSIS — K5900 Constipation, unspecified: Secondary | ICD-10-CM | POA: Diagnosis present

## 2017-06-15 DIAGNOSIS — I5030 Unspecified diastolic (congestive) heart failure: Secondary | ICD-10-CM | POA: Diagnosis not present

## 2017-06-15 DIAGNOSIS — E870 Hyperosmolality and hypernatremia: Secondary | ICD-10-CM | POA: Diagnosis present

## 2017-06-15 DIAGNOSIS — E1165 Type 2 diabetes mellitus with hyperglycemia: Secondary | ICD-10-CM | POA: Diagnosis present

## 2017-06-15 DIAGNOSIS — B9620 Unspecified Escherichia coli [E. coli] as the cause of diseases classified elsewhere: Secondary | ICD-10-CM | POA: Diagnosis present

## 2017-06-15 DIAGNOSIS — I69353 Hemiplegia and hemiparesis following cerebral infarction affecting right non-dominant side: Secondary | ICD-10-CM | POA: Diagnosis not present

## 2017-06-15 DIAGNOSIS — I69391 Dysphagia following cerebral infarction: Secondary | ICD-10-CM | POA: Diagnosis not present

## 2017-06-15 DIAGNOSIS — I63412 Cerebral infarction due to embolism of left middle cerebral artery: Secondary | ICD-10-CM | POA: Diagnosis present

## 2017-06-15 DIAGNOSIS — I161 Hypertensive emergency: Secondary | ICD-10-CM | POA: Diagnosis not present

## 2017-06-15 DIAGNOSIS — G8191 Hemiplegia, unspecified affecting right dominant side: Secondary | ICD-10-CM | POA: Diagnosis present

## 2017-06-15 DIAGNOSIS — Z794 Long term (current) use of insulin: Secondary | ICD-10-CM | POA: Diagnosis not present

## 2017-06-15 DIAGNOSIS — I10 Essential (primary) hypertension: Secondary | ICD-10-CM | POA: Diagnosis present

## 2017-06-15 DIAGNOSIS — Z90710 Acquired absence of both cervix and uterus: Secondary | ICD-10-CM

## 2017-06-15 DIAGNOSIS — I639 Cerebral infarction, unspecified: Secondary | ICD-10-CM

## 2017-06-15 DIAGNOSIS — E119 Type 2 diabetes mellitus without complications: Secondary | ICD-10-CM | POA: Diagnosis not present

## 2017-06-15 DIAGNOSIS — G8111 Spastic hemiplegia affecting right dominant side: Secondary | ICD-10-CM | POA: Diagnosis not present

## 2017-06-15 DIAGNOSIS — Z833 Family history of diabetes mellitus: Secondary | ICD-10-CM

## 2017-06-15 DIAGNOSIS — Z888 Allergy status to other drugs, medicaments and biological substances status: Secondary | ICD-10-CM | POA: Diagnosis not present

## 2017-06-15 DIAGNOSIS — Z6832 Body mass index (BMI) 32.0-32.9, adult: Secondary | ICD-10-CM | POA: Diagnosis not present

## 2017-06-15 DIAGNOSIS — Z885 Allergy status to narcotic agent status: Secondary | ICD-10-CM | POA: Diagnosis not present

## 2017-06-15 DIAGNOSIS — R339 Retention of urine, unspecified: Secondary | ICD-10-CM | POA: Diagnosis present

## 2017-06-15 DIAGNOSIS — D72829 Elevated white blood cell count, unspecified: Secondary | ICD-10-CM

## 2017-06-15 DIAGNOSIS — N390 Urinary tract infection, site not specified: Secondary | ICD-10-CM | POA: Diagnosis present

## 2017-06-15 DIAGNOSIS — Z7982 Long term (current) use of aspirin: Secondary | ICD-10-CM

## 2017-06-15 DIAGNOSIS — I63511 Cerebral infarction due to unspecified occlusion or stenosis of right middle cerebral artery: Secondary | ICD-10-CM | POA: Diagnosis not present

## 2017-06-15 DIAGNOSIS — I63411 Cerebral infarction due to embolism of right middle cerebral artery: Secondary | ICD-10-CM | POA: Diagnosis not present

## 2017-06-15 DIAGNOSIS — I69320 Aphasia following cerebral infarction: Secondary | ICD-10-CM | POA: Diagnosis not present

## 2017-06-15 DIAGNOSIS — Z9114 Patient's other noncompliance with medication regimen: Secondary | ICD-10-CM | POA: Diagnosis not present

## 2017-06-15 DIAGNOSIS — E785 Hyperlipidemia, unspecified: Secondary | ICD-10-CM | POA: Diagnosis present

## 2017-06-15 DIAGNOSIS — E7800 Pure hypercholesterolemia, unspecified: Secondary | ICD-10-CM | POA: Diagnosis present

## 2017-06-15 DIAGNOSIS — E876 Hypokalemia: Secondary | ICD-10-CM | POA: Diagnosis not present

## 2017-06-15 DIAGNOSIS — F329 Major depressive disorder, single episode, unspecified: Secondary | ICD-10-CM | POA: Diagnosis present

## 2017-06-15 DIAGNOSIS — Z91199 Patient's noncompliance with other medical treatment and regimen due to unspecified reason: Secondary | ICD-10-CM

## 2017-06-15 DIAGNOSIS — R4701 Aphasia: Secondary | ICD-10-CM | POA: Diagnosis not present

## 2017-06-15 DIAGNOSIS — E1169 Type 2 diabetes mellitus with other specified complication: Secondary | ICD-10-CM

## 2017-06-15 DIAGNOSIS — R7309 Other abnormal glucose: Secondary | ICD-10-CM | POA: Diagnosis not present

## 2017-06-15 DIAGNOSIS — R509 Fever, unspecified: Secondary | ICD-10-CM | POA: Diagnosis not present

## 2017-06-15 DIAGNOSIS — G8101 Flaccid hemiplegia affecting right dominant side: Secondary | ICD-10-CM | POA: Diagnosis not present

## 2017-06-15 DIAGNOSIS — G459 Transient cerebral ischemic attack, unspecified: Secondary | ICD-10-CM | POA: Insufficient documentation

## 2017-06-15 DIAGNOSIS — H53461 Homonymous bilateral field defects, right side: Secondary | ICD-10-CM | POA: Diagnosis present

## 2017-06-15 DIAGNOSIS — E7849 Other hyperlipidemia: Secondary | ICD-10-CM | POA: Diagnosis not present

## 2017-06-15 DIAGNOSIS — A499 Bacterial infection, unspecified: Secondary | ICD-10-CM | POA: Diagnosis not present

## 2017-06-15 DIAGNOSIS — I672 Cerebral atherosclerosis: Secondary | ICD-10-CM | POA: Diagnosis present

## 2017-06-15 DIAGNOSIS — R1310 Dysphagia, unspecified: Secondary | ICD-10-CM | POA: Diagnosis present

## 2017-06-15 HISTORY — DX: Cerebral infarction, unspecified: I63.9

## 2017-06-15 LAB — COMPREHENSIVE METABOLIC PANEL
ALBUMIN: 3.6 g/dL (ref 3.5–5.0)
ALK PHOS: 85 U/L (ref 38–126)
ALT: 17 U/L (ref 14–54)
AST: 22 U/L (ref 15–41)
Anion gap: 11 (ref 5–15)
BILIRUBIN TOTAL: 0.7 mg/dL (ref 0.3–1.2)
BUN: 17 mg/dL (ref 6–20)
CALCIUM: 9.1 mg/dL (ref 8.9–10.3)
CO2: 30 mmol/L (ref 22–32)
CREATININE: 0.78 mg/dL (ref 0.44–1.00)
Chloride: 100 mmol/L — ABNORMAL LOW (ref 101–111)
GFR calc non Af Amer: >60 mL/min (ref >60)
GLUCOSE: 144 mg/dL — AB (ref 65–99)
Potassium: 3.7 mmol/L (ref 3.5–5.1)
Sodium: 141 mmol/L (ref 135–145)
TOTAL PROTEIN: 7.2 g/dL (ref 6.5–8.1)

## 2017-06-15 LAB — RAPID URINE DRUG SCREEN, HOSP PERFORMED
AMPHETAMINES: NOT DETECTED
Barbiturates: NOT DETECTED
Benzodiazepines: NOT DETECTED
Cocaine: NOT DETECTED
Opiates: NOT DETECTED
TETRAHYDROCANNABINOL: NOT DETECTED

## 2017-06-15 LAB — URINALYSIS, ROUTINE W REFLEX MICROSCOPIC
BILIRUBIN URINE: NEGATIVE
Bacteria, UA: NONE SEEN
GLUCOSE, UA: NEGATIVE mg/dL
Hgb urine dipstick: NEGATIVE
Ketones, ur: NEGATIVE mg/dL
LEUKOCYTES UA: NEGATIVE
NITRITE: NEGATIVE
PH: 6 (ref 5.0–8.0)
Protein, ur: 30 mg/dL — AB
SPECIFIC GRAVITY, URINE: 1.018 (ref 1.005–1.030)

## 2017-06-15 LAB — I-STAT CHEM 8, ED
BUN: 17 mg/dL (ref 6–20)
CALCIUM ION: 1.16 mmol/L (ref 1.15–1.40)
CHLORIDE: 100 mmol/L — AB (ref 101–111)
CREATININE: 0.8 mg/dL (ref 0.44–1.00)
GLUCOSE: 140 mg/dL — AB (ref 65–99)
HCT: 46 % (ref 36.0–46.0)
Hemoglobin: 15.6 g/dL — ABNORMAL HIGH (ref 12.0–15.0)
Potassium: 3.5 mmol/L (ref 3.5–5.1)
SODIUM: 141 mmol/L (ref 135–145)
TCO2: 31 mmol/L (ref 22–32)

## 2017-06-15 LAB — DIFFERENTIAL
Basophils Absolute: 0 10*3/uL (ref 0.0–0.1)
Basophils Relative: 0 %
EOS PCT: 1 %
Eosinophils Absolute: 0.1 10*3/uL (ref 0.0–0.7)
LYMPHS ABS: 2.8 10*3/uL (ref 0.7–4.0)
LYMPHS PCT: 28 %
MONO ABS: 1 10*3/uL (ref 0.1–1.0)
Monocytes Relative: 10 %
NEUTROS ABS: 6.1 10*3/uL (ref 1.7–7.7)
Neutrophils Relative %: 61 %

## 2017-06-15 LAB — I-STAT TROPONIN, ED: Troponin i, poc: 0.02 ng/mL (ref 0.00–0.08)

## 2017-06-15 LAB — CBC
HEMATOCRIT: 44.3 % (ref 36.0–46.0)
HEMOGLOBIN: 14.3 g/dL (ref 12.0–15.0)
MCH: 28.1 pg (ref 26.0–34.0)
MCHC: 32.3 g/dL (ref 30.0–36.0)
MCV: 87.2 fL (ref 78.0–100.0)
Platelets: 264 10*3/uL (ref 150–400)
RBC: 5.08 MIL/uL (ref 3.87–5.11)
RDW: 14.2 % (ref 11.5–15.5)
WBC: 10.1 10*3/uL (ref 4.0–10.5)

## 2017-06-15 LAB — PROTIME-INR
INR: 1.09
Prothrombin Time: 14 seconds (ref 11.4–15.2)

## 2017-06-15 LAB — ETHANOL: Alcohol, Ethyl (B): <10 mg/dL (ref <10)

## 2017-06-15 LAB — APTT: aPTT: 33 seconds (ref 24–36)

## 2017-06-15 MED ORDER — ACETAMINOPHEN 325 MG PO TABS
650.0000 mg | ORAL_TABLET | ORAL | Status: DC | PRN
Start: 2017-06-15 — End: 2017-06-20
  Administered 2017-06-19 (×2): 650 mg via ORAL
  Filled 2017-06-15 (×3): qty 2

## 2017-06-15 MED ORDER — ASPIRIN EC 81 MG PO TBEC
81.0000 mg | DELAYED_RELEASE_TABLET | Freq: Every day | ORAL | Status: DC
  Administered 2017-06-15: 81 mg via ORAL
  Filled 2017-06-15: qty 1

## 2017-06-15 MED ORDER — LOSARTAN POTASSIUM 50 MG PO TABS
100.0000 mg | ORAL_TABLET | Freq: Every day | ORAL | Status: DC
  Administered 2017-06-15: 100 mg via ORAL
  Filled 2017-06-15: qty 2

## 2017-06-15 MED ORDER — ACETAMINOPHEN 160 MG/5ML PO SOLN: 650.0000 mg | ORAL | Status: DC | PRN

## 2017-06-15 MED ORDER — SIMVASTATIN 20 MG PO TABS
20.0000 mg | ORAL_TABLET | Freq: Every day | ORAL | Status: DC
  Administered 2017-06-15: 20 mg via ORAL
  Filled 2017-06-15: qty 1

## 2017-06-15 MED ORDER — CITALOPRAM HYDROBROMIDE 20 MG PO TABS
10.0000 mg | ORAL_TABLET | Freq: Every day | ORAL | Status: DC
  Administered 2017-06-15 – 2017-06-20 (×6): 10 mg via ORAL
  Filled 2017-06-15 (×6): qty 1

## 2017-06-15 MED ORDER — INSULIN ASPART 100 UNIT/ML ~~LOC~~ SOLN
0.0000 [IU] | SUBCUTANEOUS | Status: DC
  Administered 2017-06-16: 1 [IU] via SUBCUTANEOUS

## 2017-06-15 MED ORDER — METFORMIN HCL 500 MG PO TABS: 500.0000 mg | ORAL_TABLET | Freq: Two times a day (BID) | ORAL | Status: DC

## 2017-06-15 MED ORDER — AMLODIPINE BESYLATE 5 MG PO TABS
10.0000 mg | ORAL_TABLET | Freq: Every day | ORAL | Status: DC
  Administered 2017-06-15: 10 mg via ORAL
  Filled 2017-06-15: qty 2

## 2017-06-15 MED ORDER — ASPIRIN 325 MG PO TABS
325.0000 mg | ORAL_TABLET | Freq: Every day | ORAL | Status: DC
  Administered 2017-06-16 – 2017-06-20 (×5): 325 mg via ORAL
  Filled 2017-06-15 (×5): qty 1

## 2017-06-15 MED ORDER — VITAMIN B-12 1000 MCG PO TABS
1000.0000 ug | ORAL_TABLET | Freq: Every day | ORAL | Status: DC
  Administered 2017-06-16 – 2017-06-20 (×5): 1000 ug via ORAL
  Filled 2017-06-15 (×3): qty 1

## 2017-06-15 MED ORDER — ACETAMINOPHEN 650 MG RE SUPP: 650.0000 mg | RECTAL | Status: DC | PRN

## 2017-06-15 MED ORDER — METOPROLOL SUCCINATE ER 50 MG PO TB24
100.0000 mg | ORAL_TABLET | Freq: Every day | ORAL | Status: DC
  Administered 2017-06-15: 100 mg via ORAL
  Filled 2017-06-15: qty 2

## 2017-06-15 MED ORDER — STROKE: EARLY STAGES OF RECOVERY BOOK
Freq: Once | Status: AC
  Administered 2017-06-16: 06:00:00
  Filled 2017-06-15: qty 1

## 2017-06-15 MED ORDER — ASPIRIN 300 MG RE SUPP: 300.0000 mg | Freq: Every day | RECTAL | Status: DC

## 2017-06-15 NOTE — ED Triage Notes (Addendum)
Pt's daughter in law states that around noon yesterday, pt began having difficulty speaking and unable to write with RT hand. Pt hx of stroke, but is unable to verbalize any deficits. No facial droop or weakness noted. Pt only able to speak single words. EDP aware.

## 2017-06-15 NOTE — H&P (Signed)
TRH H&P   Patient Demographics:    Sandra [name], is a 77 y.o. female  MRN: [id number]   DOB - [birth year]  Admit Date - 06/15/2017  Outpatient Primary MD for the patient is Sandra Macadam, MD  Referring MD/NP/PA: Noemi Chapel  Outpatient Specialists:   Patient coming from: home  Chief Complaint  Patient presents with  . Stroke Symptoms      HPI:    Sandra [name]  is a 77 y.o. female, w hypertension, hyperlipidemia, Dm2, CVA,  apparently not taking bp medication for the past 2 weeks presents with dysarthria.  Pt is not taking aspirin at home. Per teleneurology last known well 1200 on 06/14/2017.  Pt has difficulty with comprehension as well it appears.    Pt was seen in pcp office yesterday and treated for her hypertension, sbp >200.  Pt was worse today per her daughter in law and therefore brought to ED.   In Ed, evaluated by teleneurology, outside [facility] window  CT brain   None.  IMPRESSION: Stable.  No acute intracranial abnormality  Atrophy with chronic small vessel white matter ischemic disease.  Etoh <10 INR 1.09 PTT  33  Wbc 10.1, hgb 14.3, Plt  Na 141, K 3.7, Bun 17, Creatinine 0.78  Pt will be admitted for dysarthria and receptive aphasia and probable new CVA secondary to poor bp control.    Review of systems:    In addition to the HPI above,  No Fever-chills, No Headache, No changes with Vision or hearing, No problems swallowing food or Liquids, No Chest pain, Cough or Shortness of Breath, No Abdominal pain, No Nausea or Vommitting, Bowel movements are regular, No Blood in stool or Urine, No dysuria, No new skin rashes or bruises, No new joints pains-aches,  No new weakness, tingling, numbness in any extremity, No recent weight gain or loss, No polyuria, polydypsia or polyphagia, No significant Mental Stressors.  A full 10 point Review  of Systems was done, except as stated above, all other Review of Systems were negative.   With Past History of the following :    Past Medical History:  Diagnosis Date  . Colon polyp   . Constipation   . Diabetes mellitus without complication ([place])   . GI bleed   . Hemorrhoids   . High cholesterol   . Hypertension   . PAD (peripheral artery disease) ([place])   . Peripheral neuropathy   . Rectal bleeding    "for 7 years"  . Rectal bleeding   . Stroke [facility])       Past Surgical History:  Procedure Laterality Date  . ABDOMINAL HYSTERECTOMY    . COLONOSCOPY     X 7  . COLONOSCOPY N/A 03/21/2017   Procedure: COLONOSCOPY;  Surgeon: Danie Binder, MD;  Location: [facility];  Service: Endoscopy;  Laterality: N/A;  1215  Social History:     Social History   Tobacco Use  . Smoking status: Never Smoker  . Smokeless tobacco: Never Used  Substance Use Topics  . Alcohol use: No     Lives - at home  Mobility - walks by self prior to this event   Family History :     Family History  Problem Relation Age of Onset  . Diabetes Mother   . Cancer Father   . Diabetes Sister   . Diabetes Brother   . Diabetes Daughter   . Diabetes Maternal Grandmother   . Diabetes Daughter   . Colon cancer Neg Hx       Home Medications:   Prior to Admission medications   Medication Sig Start Date End Date Taking? Authorizing Provider  amLODipine (NORVASC) 10 MG tablet Take 10 mg by mouth daily.   Yes [provider]  aspirin EC 81 MG tablet Take 81 mg by mouth daily.   Yes [provider]  citalopram (CELEXA) 10 MG tablet Take 10 mg by mouth daily.   Yes [provider]  ergocalciferol (DRISDOL) 8000 UNIT/ML drops Take by mouth daily. Takes 2 drops daily   Yes [provider]  LANTUS 100 UNIT/ML injection Inject 0.38 mLs (38 Units total) at bedtime into the skin. 03/21/17  Yes Hagler, Apolonio Schneiders, MD  losartan (COZAAR) 100 MG tablet Take 100 mg by  mouth daily.   Yes [provider]  metFORMIN (GLUCOPHAGE) 500 MG tablet Take 500 mg by mouth 2 (two) times daily with a meal.   Yes [provider]  metoprolol succinate (TOPROL-XL) 100 MG 24 hr tablet Take 1 tablet (100 mg total) by mouth daily. Take with or immediately following a meal. 05/30/17  Yes Hagler, Apolonio Schneiders, MD  Multiple Vitamins-Minerals (ONE-A-DAY 50 PLUS PO) Take 1 tablet by mouth daily.   Yes [provider]  olmesartan (BENICAR) 40 MG tablet Take 1 tablet (40 mg total) by mouth daily. 06/14/17  Yes Hagler, Apolonio Schneiders, MD  Omega-3 Fatty Acids (FISH OIL) 1200 MG CAPS Take 1 capsule by mouth daily.   Yes [provider]  polyethylene glycol powder (GLYCOLAX/MIRALAX) powder Take 17 g by mouth as needed for mild constipation or moderate constipation.    Yes [provider]  simvastatin (ZOCOR) 20 MG tablet Take 20 mg by mouth daily at 6 PM.   Yes [provider]  vitamin B-12 (CYANOCOBALAMIN) 1000 MCG tablet Take 1,000 mcg by mouth daily.   Yes [provider]  hydrocortisone (ANUSOL-HC) 2.5 % rectal cream Apply rectally 2 times daily Patient not taking: Reported on 05/24/2017 11/04/16   [name], MD     Allergies:     Allergies  Allergen Reactions  . Hctz [Hydrochlorothiazide] Other (See Comments)    "gave me pancreatitis"  . Morphine And Related     "made room spin"  . Valium [Diazepam] Other (See Comments)    Confusion, blurred vision     Physical Exam:   Vitals  Blood pressure (!) 168/78, pulse 72, temperature 98.5 F (36.9 C), temperature source Oral, resp. rate 15, height 5\' 5"  (1.651 m), weight 89.8 kg (198 lb), SpO2 94 %.   1. General  lying in bed in NAD,   2. Normal affect and insight, Not Suicidal or Homicidal, Awake Alert, Oriented X 3. + dysarthria, slow speech, garbled, no facial droop.  Slight receptive aphasia  3. No F.N deficits, ALL C.Nerves Intact, Strength 5/5 all 4 extremities, Sensation  intact all 4 extremities, Plantars down going.  4. Ears and Eyes appear Normal, Conjunctivae clear, PERRLA. Moist Oral Mucosa.  5. Supple Neck, No JVD, No cervical lymphadenopathy appriciated, No Carotid Bruits.  6. Symmetrical Chest wall movement, Good air movement bilaterally, CTAB.  7. RRR, No Gallops, Rubs or Murmurs, No Parasternal Heave.  8. Positive Bowel Sounds, Abdomen Soft, No tenderness, No organomegaly appriciated,No rebound -guarding or rigidity.  9.  No Cyanosis, Normal Skin Turgor, No Skin Rash or Bruise.  10. Good muscle tone,  joints appear normal , no effusions, Normal ROM.  11. No Palpable Lymph Nodes in Neck or Axillae     Data Review:    CBC Recent Labs  Lab 06/15/17 1430 06/15/17 1441  WBC 10.1  --   HGB 14.3 15.6*  HCT 44.3 46.0  PLT 264  --   MCV 87.2  --   MCH 28.1  --   MCHC 32.3  --   RDW 14.2  --   LYMPHSABS 2.8  --   MONOABS 1.0  --   EOSABS 0.1  --   BASOSABS 0.0  --    ------------------------------------------------------------------------------------------------------------------  Chemistries  Recent Labs  Lab 06/15/17 1430 06/15/17 1441  NA 141 141  K 3.7 3.5  CL 100* 100*  CO2 30  --   GLUCOSE 144* 140*  BUN 17 17  CREATININE 0.78 0.80  CALCIUM 9.1  --   AST 22  --   ALT 17  --   ALKPHOS 85  --   BILITOT 0.7  --    ------------------------------------------------------------------------------------------------------------------ estimated creatinine clearance is 64.1 mL/min (by C-G formula based on SCr of 0.8 mg/dL). ------------------------------------------------------------------------------------------------------------------ No results for input(s): TSH, T4TOTAL, T3FREE, THYROIDAB in the last 72 hours.  Invalid input(s): FREET3  Coagulation profile Recent Labs  Lab 06/15/17 1430  INR 1.09    ------------------------------------------------------------------------------------------------------------------- No results for input(s): DDIMER in the last 72 hours. -------------------------------------------------------------------------------------------------------------------  Cardiac Enzymes No results for input(s): CKMB, TROPONINI, MYOGLOBIN in the last 168 hours.  Invalid input(s): CK ------------------------------------------------------------------------------------------------------------------ No results found for: BNP   ---------------------------------------------------------------------------------------------------------------  Urinalysis    Component Value Date/Time   COLORURINE STRAW (A) 11/29/2016 1138   APPEARANCEUR CLEAR 11/29/2016 1138   LABSPEC 1.006 11/29/2016 1138   PHURINE 8.0 11/29/2016 1138   [place] 11/29/2016 1138   IXL 11/29/2016 1138   [place] 11/29/2016 1138   [place] 11/29/2016 1138   PROTEINUR 30 (A) 11/29/2016 1138   NITRITE NEGATIVE 11/29/2016 1138   LEUKOCYTESUR NEGATIVE 11/29/2016 1138    ----------------------------------------------------------------------------------------------------------------   Imaging Results:    Ct Head Wo Contrast  Result Date: 06/15/2017 CLINICAL DATA:  Difficulty speaking since yesterday EXAM: CT HEAD WITHOUT CONTRAST TECHNIQUE: Contiguous axial images were obtained from the base of the skull through the vertex without intravenous contrast. COMPARISON:  11/29/2016 FINDINGS: Brain: There is no evidence for acute hemorrhage, hydrocephalus, mass lesion, or abnormal extra-axial fluid collection. No definite CT evidence for acute infarction. Diffuse loss of parenchymal volume is consistent with atrophy. Patchy low attenuation in the deep hemispheric and periventricular white matter is nonspecific, but likely reflects chronic microvascular ischemic demyelination.  Vascular: No hyperdense vessel or unexpected calcification. Skull: No evidence for fracture. No worrisome lytic or sclerotic lesion. Sinuses/Orbits: The visualized paranasal sinuses and mastoid air cells are clear. Visualized portions of the globes and intraorbital fat are unremarkable. Other: None. IMPRESSION: Stable.  No acute intracranial abnormality Atrophy with chronic small vessel white matter ischemic disease. Electronically Signed  By: Misty Stanley M.D.   On: 06/15/2017 15:03    nsr at 67, nl axis, early R progression, no st-t changes c/w ischemia    Assessment & Plan:    Principal Problem:   Stroke [facility]) Active Problems:   Essential hypertension   Type 2 diabetes mellitus with neurological complications ([place])   Hyperlipidemia associated with type 2 diabetes mellitus ([place])    Dysarthria, receptive aphasia,  likely CVA secondary to noncompliance with bp medications Start Aspirin Cont simvastatin Check hga1c, check lipid Carotid ultrasound Cardiac echo MRI/ MRA brain Neurology consult, appreciate input  Hypertension Cont home bp medication Cont amlodipine 10mg  po qday Cont lostartan 100mg  po qday DC benicar (duplication of therapy) Cont metoprolol succinate 100mg  po qday Permissive hypertension  Hyperlipidemia Cont simvastatin  Dm2 fsbs q4h, ISS  DVT Prophylaxis  SCDs  AM Labs Ordered, also please review Full Orders  Family Communication: Admission, patients condition and plan of care including tests being ordered have been discussed with the patient  who indicate understanding and agree with the plan and Code Status.  Code Status FULL CODE  Likely DC to home  Condition GUARDED   Consults called: neurology   Admission status: inpatient  Time spent in minutes : 45   Jani Gravel M.D on 06/15/2017 at 6:07 PM  Between 7am to 7pm - Pager - [phone]  . After 7pm go to [email or web address] - password [email or web address]  [facility] - Office  [phone]

## 2017-06-15 NOTE — ED Notes (Signed)
Per son, pt has not been taking her bp meds for ? 2 weeks, seen pcp yesterday morning.  Started to feel bad afterwards.  Son reports that pt does not like how her bp meds make her feel.  Son reports that pt took bp meds yesterday morning.

## 2017-06-15 NOTE — ED Notes (Signed)
EDP at bedside  

## 2017-06-15 NOTE — ED Provider Notes (Signed)
[facility] EMERGENCY DEPARTMENT Provider Note   CSN: [id number] Arrival date & time: 06/15/17  1403     History   Chief Complaint Chief Complaint  Patient presents with  . Stroke Symptoms    HPI Sandra [name] is a 77 y.o. female.  HPI  The patient is a 77 year old female, she has a known history of a stroke as reported by her daughter-in-law who is the primary historian.  Unfortunately the patient has developed a thick aphasia that started yesterday sometime in the mid to late afternoon, the onset is difficult to ascertain from the daughter-in-law but it seems to be somewhere between noon and 4:00.  The patient had reportedly been in her usual state of health prior to that, ability to walk talk and he will perform daily activities without difficulty.  Unfortunately last evening she had some difficulty with her speech which persisted today and today they also noticed that she was having difficulty writing with her right hand.  The patient has been unable to speak in any ascertainable language, she is visibly frustrated by this inability to speak.  They are unable to tell me what her prior stroke had done to her.  She was actually seen at her doctor's office yesterday morning and treated for hypertension, new medications were started which she stated made her nauseated however her blood pressure yesterday was well over 867 systolic.  Level 5 caveat applies secondary to dense expressive aphasia   Past Medical History:  Diagnosis Date  . Colon polyp   . Constipation   . Diabetes mellitus without complication ([place])   . GI bleed   . Hemorrhoids   . High cholesterol   . Hypertension   . PAD (peripheral artery disease) ([place])   . Peripheral neuropathy   . Rectal bleeding    "for 7 years"  . Rectal bleeding   . Stroke [facility])     Patient Active Problem List   Diagnosis Date Noted  . Hyperlipidemia associated with type 2 diabetes mellitus ([place]) 06/14/2017  . Type 2 diabetes  mellitus with neurological complications ([place]) 61/95/0932  . CVA (cerebral vascular accident) ([place]) 03/31/2017  . History of adenomatous polyp of colon 02/11/2017  . Dysphagia 11/19/2016  . Essential hypertension 08/12/2016  . Hemorrhoids 06/16/2016  . Rectal bleeding 06/16/2016  . Constipation 06/16/2016    Past Surgical History:  Procedure Laterality Date  . ABDOMINAL HYSTERECTOMY    . COLONOSCOPY     X 7  . COLONOSCOPY N/A 03/21/2017   Procedure: COLONOSCOPY;  Surgeon: Danie Binder, MD;  Location: [facility] ENDO SUITE;  Service: Endoscopy;  Laterality: N/A;  60     OB History    Gravida Para Term Preterm AB Living             6   SAB TAB Ectopic Multiple Live Births                  Home Medications    Prior to Admission medications   Medication Sig Start Date End Date Taking? Authorizing Provider  aspirin EC 81 MG tablet Take 81 mg by mouth daily.    [provider]  ergocalciferol (DRISDOL) 8000 UNIT/ML drops Take by mouth daily. Takes 2 drops daily    [provider]  hydrocortisone (ANUSOL-HC) 2.5 % rectal cream Apply rectally 2 times daily Patient not taking: Reported on 05/24/2017 11/04/16   [name], MD  LANTUS 100 UNIT/ML injection Inject 0.38 mLs (38 Units total) at bedtime  into the skin. 03/21/17   Caren Macadam, MD  metoprolol succinate (TOPROL-XL) 100 MG 24 hr tablet Take 1 tablet (100 mg total) by mouth daily. Take with or immediately following a meal. Patient not taking: Reported on 06/14/2017 05/30/17   Caren Macadam, MD  Multiple Vitamins-Minerals (ONE-A-DAY 50 PLUS PO) Take 1 tablet by mouth daily.    [provider]  olmesartan (BENICAR) 40 MG tablet Take 1 tablet (40 mg total) by mouth daily. 06/14/17   Caren Macadam, MD  Omega-3 Fatty Acids (FISH OIL) 1200 MG CAPS Take 1 capsule by mouth daily.    [provider]  polyethylene glycol powder (GLYCOLAX/MIRALAX) powder Take 17 g by mouth as needed for mild constipation or  moderate constipation.     [provider]  vitamin B-12 (CYANOCOBALAMIN) 1000 MCG tablet Take 1,000 mcg by mouth daily.    [provider]    Family History Family History  Problem Relation Age of Onset  . Diabetes Mother   . Cancer Father   . Diabetes Sister   . Diabetes Brother   . Diabetes Daughter   . Diabetes Maternal Grandmother   . Diabetes Daughter   . Colon cancer Neg Hx     Social History Social History   Tobacco Use  . Smoking status: Never Smoker  . Smokeless tobacco: Never Used  Substance Use Topics  . Alcohol use: No  . Drug use: No     Allergies   Hctz [hydrochlorothiazide]; Morphine and related; and Valium [diazepam]   Review of Systems Review of Systems  Unable to perform ROS: Acuity of condition     Physical Exam Updated Vital Signs BP (!) 155/63 (BP Location: Right Arm) Comment: Simultaneous filing. User may not have seen previous data.  Pulse 76 Comment: Simultaneous filing. User may not have seen previous data.  Temp 98.5 F (36.9 C) (Oral)   Resp 18   Ht 5\' 5"  (1.651 m)   Wt 89.8 kg (198 lb)   SpO2 94% Comment: Simultaneous filing. User may not have seen previous data.  BMI 32.95 kg/m   Physical Exam  Constitutional: She appears well-developed and well-nourished. No distress.  HENT:  Head: Normocephalic and atraumatic.  Mouth/Throat: Oropharynx is clear and moist. No oropharyngeal exudate.  Eyes: Conjunctivae and EOM are normal. Pupils are equal, round, and reactive to light. Right eye exhibits no discharge. Left eye exhibits no discharge. No scleral icterus.  Neck: Normal range of motion. Neck supple. No JVD present. No thyromegaly present.  Cardiovascular: Normal rate, regular rhythm, normal heart sounds and intact distal pulses. Exam reveals no gallop and no friction rub.  No murmur heard. Pulmonary/Chest: Effort normal and breath sounds normal. No respiratory distress. She has no wheezes. She has no rales.    Abdominal: Soft. Bowel sounds are normal. She exhibits no distension and no mass. There is no tenderness.  Musculoskeletal: Normal range of motion. She exhibits no edema or tenderness.  Lymphadenopathy:    She has no cervical adenopathy.  Neurological: She is alert.  The patient has difficulty speaking, she is saying inappropriate words and nonsensical words at times mixed with some normal words.  She is visibly frustrated by this.  She is able to lift both legs though the right side seems slightly weaker than the left, she is able to raise both arms and has equal grips.  She has some dysmetria of the right upper extremity with finger-nose-finger, she has no obvious facial droop, her sensory function is  intact diffusely  Skin: Skin is warm and dry. No rash noted. No erythema.  Psychiatric: She has a normal mood and affect. Her behavior is normal.  Nursing note and vitals reviewed.    ED Treatments / Results  Labs (all labs ordered are listed, but only abnormal results are displayed) Labs Reviewed - No data to display  EKG  EKG Interpretation  Date/Time:  Wednesday June 15 2017 14:23:48 EST Ventricular Rate:  77 PR Interval:    QRS Duration: 108 QT Interval:  406 QTC Calculation: 460 R Axis:   -14 Text Interpretation:  Sinus rhythm RSR' in V1 or V2, right VCD or RVH Left ventricular hypertrophy Baseline wander in lead(s) I II III aVR aVL aVF V1 No old tracing to compare Abnormal ekg Confirmed by Noemi Chapel [phone]) on 06/15/2017 2:39:31 PM       Radiology No results found.  Procedures Procedures (including critical care time)  Medications Ordered in ED Medications - No data to display   Initial Impression / Assessment and Plan / ED Course  I have reviewed the triage vital signs and the nursing notes.  Pertinent labs & imaging results that were available during my care of the patient were reviewed by me and considered in my medical decision making (see chart for  details).    The patient is likely had a stroke, she is outside of a stroke like window unfortunately based on timing it puts her at approximately 26 hours from noon yesterday, will discuss with neurology and admit to the hospitalist.  CT scan pending  Medical record shows that yesterday in the office she was stating that she had not taken her medications except for her insulin because she felt like she had talked to the [facility] and was told not to take them anymore.  She reported that she was feeling better.  She had a long discussion with the doctor where they cautioned her against avoiding medications because it could cause things such as strokes she did not have any focal neurologic deficits at that time  Last MRI was obtained in July 2018 and showed no acute strokes or intracranial abnormalities  Discussed with the neurologist at 3:30 PM, given no acute hemorrhage and findings of expressive and possibly some receptive aphasia but they recommend inpatient evaluation with a full stroke workup.  D/w Dr. Maudie Mercury - wil ladmit  Final Clinical Impressions(s) / ED Diagnoses   Final diagnoses:  Acute ischemic stroke [facility])      Noemi Chapel, MD 06/15/17 1600

## 2017-06-15 NOTE — ED Notes (Signed)
Patient transported to CT 

## 2017-06-15 NOTE — Consult Note (Signed)
[facility] TeleNeurology Consult Services  Asked to see this patient in telemedicine consultation. Consultation was performed with assistance of ancillary / medical staff at bedside.  Impression: Stroke - Subacute expressive aphasia +/- mild receptive aphasia with reported right hand weakness in the past day (possibly improved).    Differential Diagnosis:  1. Cardioembolic stroke  2. Small vessel disease/lacune  3. Thromboembolic, artery-to-artery mechanism  4. Hypercoagulable state-related infarct  5. Thrombotic mechanism, large artery disease   Presentation:  * Symptoms: 77 year old woman presents with difficulty speaking and using her right hand.  PMH:  DM, HTN, HLD, GI bleed, colon polyp, peripheral arterial disease.  Not on anticoagulation.  She takes ASA 81 mg daily.  * Last Known Well: 1200 on 06/14/17  * History provided by: EMR chart  * History of anticoagulation or other contraindications for thrombolytics: No   Telestroke Assessment:  Patient is in no apparent distress. Patient appears as stated age. No obvious acute respiratory or cardiac distress. Patient is well groomed and well-nourished.   NIH Stroke Scale/Score (NIHSS) on 06/15/2017   RESULT SUMMARY: 5 points NIH Stroke Scale   INPUTS: 1A: Level of consciousness -> 0 = Alert; keenly responsive 1B: Ask month and age -> 2 = 0 questions right  1C: 'Blink eyes' & 'squeeze hands' -> 0 = Performs both tasks 2: Horizontal extraocular movements -> 0 = Normal 3: Visual fields -> 0 = No visual loss 4: Facial palsy -> 0 = Normal symmetry 5A: Left arm motor drift -> 0 = No drift for 10 seconds 5B: Right arm motor drift -> 0 = No drift for 10 seconds 6A: Left leg motor drift -> 0 = No drift for 5 seconds 6B: Right leg motor drift -> 0 = No drift for 5 seconds 7: Limb Ataxia -> 0 = No ataxia 8: Sensation -> 0 = Normal; no sensory loss 9: Language/aphasia -> 2 = Severe aphasia: fragmentary expression,  inference needed, cannot identify materials 10: Dysarthria -> 1 = Mild-moderate dysarthria: slurring but can be understood 11: Extinction/inattention -> 0 = No abnormality   Comments:  [facility] contacted: 1502 for STAT consult  [facility] first log in: Call attempt, 1507; log in 1515   NIHSS assessment time:  Blood glucose and blood pressure within acceptable parameters  CT head reviewed, and there is no indication of acute hemorrhage or mass effect.  Medical Decision Making:  * Patient is not candidate for Alteplase due to last seen normal > 4.5 hours.  * Presentation is not suggestive of large vessel occlusive disease. Thrombectomy would not be recommended. Symptoms started over 24 hours ago.   Recommendations:  1. Daily antithrombotics to be started.  2. Please consult with Neurology to see for routine follow up.   Discussed with ED physician.  Please call with questions.   Dr. Collins [place]. Tommi Rumps  [facility]   Medical Decision Making:  - Extensive number of diagnosis or management options are considered above.  - Extensive amount of complex data reviewed.  - High risk of complication and/or morbidity or mortality are associated with differential diagnostic considerations above.  - There may be uncertain outcome and increased probability of prolonged functional impairment or high probability of severe prolonged functional impairment associated with some of these differential diagnosis.  Medical Data Reviewed:  1. Data reviewed include clinical labs, radiology, medical tests;  2. Tests results discussed w/performing or interpreting physician;  3. Obtaining/reviewing old medical records;  4. Obtaining case history from another source;  5. Independent review of images.

## 2017-06-15 NOTE — ED Notes (Signed)
Pt daughter, [name], reported had to leave pt bedside to pick up kids. Pt daughter reported would return but wanted to leave contact information, [phone].

## 2017-06-15 NOTE — Telephone Encounter (Signed)
Called patient regarding message below. No answer, unable to leave message. Got a message twice that call could not be completed. I need to know what meter she wants.

## 2017-06-15 NOTE — ED Notes (Signed)
Pt returned from CT at 1500. Consulted TeleNeuro for Neuro consult at time of return to room. RN came on camera and reported would have Neurologist call EDP and reported if Neuro not on camera in approximately 15-20 minutes to call back to TeleNeuro.  EDP and Primary RN aware.

## 2017-06-16 ENCOUNTER — Inpatient Hospital Stay (HOSPITAL_COMMUNITY): Payer: Medicare Other

## 2017-06-16 ENCOUNTER — Encounter (HOSPITAL_COMMUNITY): Payer: Self-pay | Admitting: Family Medicine

## 2017-06-16 DIAGNOSIS — E785 Hyperlipidemia, unspecified: Secondary | ICD-10-CM

## 2017-06-16 DIAGNOSIS — I5030 Unspecified diastolic (congestive) heart failure: Secondary | ICD-10-CM

## 2017-06-16 LAB — GLUCOSE, CAPILLARY
GLUCOSE-CAPILLARY: 119 mg/dL — AB (ref 65–99)
GLUCOSE-CAPILLARY: 163 mg/dL — AB (ref 65–99)
GLUCOSE-CAPILLARY: 102 mg/dL — AB (ref 65–99)
Glucose-Capillary: 124 mg/dL — ABNORMAL HIGH (ref 65–99)
Glucose-Capillary: 128 mg/dL — ABNORMAL HIGH (ref 65–99)
Glucose-Capillary: 86 mg/dL (ref 65–99)
Glucose-Capillary: 93 mg/dL (ref 65–99)

## 2017-06-16 LAB — COMPREHENSIVE METABOLIC PANEL
ALBUMIN: 3.1 g/dL — AB (ref 3.5–5.0)
ALK PHOS: 71 U/L (ref 38–126)
ALT: 14 U/L (ref 14–54)
ANION GAP: 8 (ref 5–15)
AST: 20 U/L (ref 15–41)
BILIRUBIN TOTAL: 0.8 mg/dL (ref 0.3–1.2)
BUN: 13 mg/dL (ref 6–20)
CALCIUM: 8.8 mg/dL — AB (ref 8.9–10.3)
CO2: 31 mmol/L (ref 22–32)
Chloride: 101 mmol/L (ref 101–111)
Creatinine, Ser: 0.57 mg/dL (ref 0.44–1.00)
GFR calc Af Amer: >60 mL/min (ref >60)
GFR calc non Af Amer: >60 mL/min (ref >60)
GLUCOSE: 127 mg/dL — AB (ref 65–99)
Potassium: 3.7 mmol/L (ref 3.5–5.1)
Sodium: 140 mmol/L (ref 135–145)
TOTAL PROTEIN: 6.4 g/dL — AB (ref 6.5–8.1)

## 2017-06-16 LAB — LIPID PANEL
CHOL/HDL RATIO: 5.8 ratio
Cholesterol: 204 mg/dL — ABNORMAL HIGH (ref 0–200)
HDL: 35 mg/dL — AB (ref >40)
LDL Cholesterol: 150 mg/dL — ABNORMAL HIGH (ref 0–99)
Triglycerides: 93 mg/dL (ref <150)
VLDL: 19 mg/dL (ref 0–40)

## 2017-06-16 LAB — HEMOGLOBIN A1C
Hgb A1c MFr Bld: 8.2 % — ABNORMAL HIGH (ref 4.8–5.6)
MEAN PLASMA GLUCOSE: 188.64 mg/dL

## 2017-06-16 LAB — ECHOCARDIOGRAM COMPLETE
Height: 65 in
WEIGHTICAEL: 3164.04 [oz_av]

## 2017-06-16 MED ORDER — ATORVASTATIN CALCIUM 40 MG PO TABS
40.0000 mg | ORAL_TABLET | Freq: Every day | ORAL | Status: DC
  Administered 2017-06-16 – 2017-06-19 (×4): 40 mg via ORAL
  Filled 2017-06-16 (×5): qty 1

## 2017-06-16 MED ORDER — CLOPIDOGREL BISULFATE 75 MG PO TABS
75.0000 mg | ORAL_TABLET | Freq: Every day | ORAL | Status: DC
  Administered 2017-06-17 – 2017-06-20 (×4): 75 mg via ORAL
  Filled 2017-06-16 (×4): qty 1

## 2017-06-16 MED ORDER — INSULIN ASPART 100 UNIT/ML ~~LOC~~ SOLN
0.0000 [IU] | Freq: Three times a day (TID) | SUBCUTANEOUS | Status: DC
Start: 2017-06-16 — End: 2017-06-19
  Administered 2017-06-16 – 2017-06-17 (×2): 2 [IU] via SUBCUTANEOUS
  Administered 2017-06-17 – 2017-06-18 (×2): 1 [IU] via SUBCUTANEOUS
  Administered 2017-06-18: 2 [IU] via SUBCUTANEOUS
  Administered 2017-06-18: 3 [IU] via SUBCUTANEOUS
  Administered 2017-06-19: 9 [IU] via SUBCUTANEOUS

## 2017-06-16 MED ORDER — IOPAMIDOL (ISOVUE-370) INJECTION 76%
75.0000 mL | Freq: Once | INTRAVENOUS | Status: AC | PRN
  Administered 2017-06-16: 75 mL via INTRAVENOUS

## 2017-06-16 NOTE — Care Management Note (Signed)
Case Management Note  Patient Details  Name: Sandra [name] MRN: [id number] Date of Birth: 11-11-1940  Subjective/Objective:   Adm from home. Lives with son and DIL. +MRI for stroke. Son is a Community education officer at [employer]. Patient will need HH OT/ST/RN at time of discharge. She has cane and grab bars at home. Other recommendations are tub/shower seat. Son is aware that insurance will not cover this and where to obtain if needed.               Action/Plan:  DC home with Home health.   Expected Discharge Date:    06/17/2017             Expected Discharge Plan:  [place]  In-House Referral:  Clinical Social Work, [facility]  Discharge planning Services     Post Acute Care Choice:  Home Health Choice offered to:  Adult Children  DME Arranged:    DME Agency:     HH Arranged:  RN, OT, Speech Therapy [place] Agency:  [place]  Status of Service:  Completed, signed off  If discussed at [place] of Stay Meetings, dates discussed:    Additional Comments:  Jaymason Ledesma, Chauncey [place], RN 06/16/2017, 2:19 PM

## 2017-06-16 NOTE — Evaluation (Signed)
Speech Language Pathology Evaluation Patient Details Name: Sandra [name] MRN: [id number] DOB: Jul 28, 1940 Today's Date: 06/16/2017 Time: 4098-1191 SLP Time Calculation (min) (ACUTE ONLY): 35 min  Problem List:  Patient Active Problem List   Diagnosis Date Noted  . TIA (transient ischemic attack) 06/15/2017  . Hyperlipidemia associated with type 2 diabetes mellitus ([place]) 06/14/2017  . Type 2 diabetes mellitus with neurological complications ([place]) 47/82/9562  . Stroke ([place]) 03/31/2017  . History of adenomatous polyp of colon 02/11/2017  . Dysphagia 11/19/2016  . Essential hypertension 08/12/2016  . Hemorrhoids 06/16/2016  . Rectal bleeding 06/16/2016  . Constipation 06/16/2016   Past Medical History:  Past Medical History:  Diagnosis Date  . Colon polyp   . Constipation   . Diabetes mellitus without complication ([place])   . GI bleed   . Hemorrhoids   . High cholesterol   . Hypertension   . PAD (peripheral artery disease) ([place])   . Peripheral neuropathy   . Rectal bleeding    "for 7 years"  . Rectal bleeding   . Stroke [facility])    Past Surgical History:  Past Surgical History:  Procedure Laterality Date  . ABDOMINAL HYSTERECTOMY    . COLONOSCOPY     X 7  . COLONOSCOPY N/A 03/21/2017   Procedure: COLONOSCOPY;  Surgeon: Danie Binder, MD;  Location: [facility] ENDO SUITE;  Service: Endoscopy;  Laterality: N/A;  1215    HPI:  77 y.o.female,w hypertension, hyperlipidemia, Dm2, CVA, apparently not taking bp medication for the past 2 weeks presents with dysarthria and aphasia. Pt is not taking aspirin at home. Per teleneurology last known well 1200 on 06/14/2017. Pt has difficulty with comprehension as well it appears.The patient was noted to have an acute CVA by MRI study. Dr. Merlene Laughter notes: The MRI shows a faintly increased signal on DWI involving the left frontal and temporal region. The faintness suggests more of a subacute onset and not in acute event. Pt passed RN swallow  screen and SLE ordered as part of stroke protocol. Pt's son is a physical therapist here at [employer].   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 Pt reportedly was able to verbalize some words yesterday, however she is essentially nonverbal with me today. She was unable to follow simple commands without direct model, answer yes/no questions, point to requested objects, or name objects in room or on paper. She was able to imitate opening her mouth and producing /a/ with cues, clapping her hands together once initiated by SLP, and hum along to "Happy Birthday". SLP then played Harriett Rush on phone and Pt moded head to the music and began to cry (family stated she liked MG). SLP provided education to family and reassurance to Pt. Pt was unable to use a basic picture communication board at this time. Recommend skilled SLP services at SNF (if available to Pt) or outpatient to maximize recovery, decrease burden of care, and increase independence with communication.     SLP Assessment  SLP Recommendation/Assessment: Patient needs continued Speech Lanaguage Pathology Services SLP Visit Diagnosis: Aphasia (R47.01);Cognitive communication deficit (R41.841);Apraxia (R48.2);Frontal lobe and executive function deficit Frontal lobe and executive function deficit following: Cerebral infarction    Follow Up Recommendations  Outpatient SLP;Skilled Nursing facility    Frequency and Duration min 2x/week  1 week      SLP Evaluation Cognition  Overall Cognitive Status: Impaired/Different from baseline Arousal/Alertness:  Awake/alert Orientation Level: Oriented to person(unable to assess due to severity of aphasia) Memory: (unable to assess)       Comprehension  Auditory Comprehension Overall Auditory Comprehension: Impaired Yes/No Questions:  Impaired Basic Biographical Questions: 0-25% accurate Commands: Impaired One Step Basic Commands: 0-24% accurate Conversation: (severe receptive deficits) EffectiveTechniques: Visual/Gestural cues(modeling/imitation) Visual Recognition/Discrimination Discrimination: Exceptions to [facility] Common Objects: Unable to indentify Reading Comprehension Reading Status: Impaired Word level: Impaired Sentence Level: Not tested Paragraph Level: Not tested Functional Environmental (signs, name badge): Impaired Interfering Components: (? homonymous hemianopsia)    Expression Expression Primary Mode of Expression: Verbal Verbal Expression Overall Verbal Expression: Impaired Initiation: Impaired Automatic Speech: (could hum along to Happy Birthday with choral cues) Level of Generative/Spontaneous Verbalization: (no words) Repetition: Impaired Naming: Impairment Responsive: 0-25% accurate Confrontation: Impaired Common Objects: Unable to indentify Convergent: 0-24% accurate Divergent: 0-24% accurate Pragmatics: No impairment Effective Techniques: Melodic intonation Non-Verbal Means of Communication: Gestures;Not applicable Written Expression Dominant Hand: Right Written Expression: Not tested   Oral / Motor  Oral Motor/Sensory Function Overall Oral Motor/Sensory Function: Mild impairment Facial ROM: Reduced right Motor Speech Overall Motor Speech: Impaired Respiration: Within functional limits Phonation: Normal Resonance: Within functional limits Articulation: Impaired Level of Impairment: Word Motor Planning: Impaired Level of Impairment: Word Motor Speech Errors: Aware   Thank you,  Genene Churn, [place]                     [place] 06/16/2017, 6:12 PM

## 2017-06-16 NOTE — Plan of Care (Signed)
  No Outcome Acute Rehab OT Goals (only OT should resolve) Pt. Will Perform Grooming 06/16/2017 0956 by Chryl Heck, OT Flowsheets Taken 06/16/2017 [phone]  Pt Will Perform Grooming with supervision;standing Pt. Will Transfer To Toilet 06/16/2017 0956 by Chryl Heck, OT Flowsheets Taken 06/16/2017 [phone]  Pt Will Transfer to Toilet with modified independence;ambulating;regular height toilet Pt. Will Perform Toileting-Clothing Manipulation 06/16/2017 0956 by Chryl Heck, OT Flowsheets Taken 06/16/2017 [phone]  Pt Will Perform Toileting - Clothing Manipulation and hygiene with modified independence;sit to/from stand Pt/Caregiver Will Perform Home Exercise Program 06/16/2017 0956 by Chryl Heck, OT Flowsheets Taken 06/16/2017 [phone]  Pt/caregiver will Perform Home Exercise Program Increased strength;Right Upper extremity;With Supervision;With written HEP provided

## 2017-06-16 NOTE — Progress Notes (Signed)
PROGRESS NOTE   Sandra [name]  ZOX:[id number]  DOB: 1941/02/19  DOA: 06/15/2017 PCP: Caren Macadam, MD  Brief Admission Hx: 77 y.o. female, w hypertension, hyperlipidemia, Dm2, CVA,  apparently not taking bp medication for the past 2 weeks presents with dysarthria.  Pt is not taking aspirin at home. Per teleneurology last known well 1200 on 06/14/2017.  Pt has difficulty with comprehension as well it appears.  The patient was noted to have an acute CVA by MRI study.  MDM/Assessment & Plan:   1. Moderate size left MCA stroke-admit for workup, neurologic consultation, aspirin for antiplatelet therapy, I started atorvastatin 40 mg daily, hemoglobin A1c pending, carotid ultrasound studies pending, echocardiogram pending, PT/OT evaluation.  SLP evaluation ordered.  Further recommendations pending workup. 2. Dyslipidemia-uncontrolled as evidenced by an LDL cholesterol of 150.  I have started the patient on atorvastatin 40 mg daily. 3. Uncontrolled hypertension- given the acute CVA will allow for permissive hypertension and therefore holding home blood pressure medication at this time. 4. Aphasia-this is a new finding for the patient and I have consulted the speech therapist to evaluate. 5. Type 2 diabetes mellitus-carbohydrate modified diet, sliding scale coverage as needed.  A1c pending.  DVT prophylaxis: SCDs Code Status: Full Family Communication: No one present during rounds Disposition Plan: To be determined  Consultants:  Neurology  Procedures:  MRI/MRA, carotid Dopplers and echocardiogram pending  Subjective: The patient remains aphasic however she does blink and seems to have some comprehension of language.  She is following simple commands.  Objective: Vitals:   06/16/17 0230 06/16/17 0430 06/16/17 0630 06/16/17 0830  BP: (!) 167/49 (!) 146/68 (!) 145/64 (!) 164/83  Pulse: 67 66 65 66  Resp: 18 16 18 18   Temp: 99.2 F (37.3 C) 98.3 F (36.8 C) 98.2 F (36.8 C) 98.2  F (36.8 C)  TempSrc: Axillary Oral Oral Oral  SpO2: 95% 94% 96% 97%  Weight:      Height:        Intake/Output Summary (Last 24 hours) at 06/16/2017 1023 Last data filed at 06/15/2017 2015 Gross per 24 hour  Intake -  Output 350 ml  Net -350 ml   Filed Weights   06/15/17 1417 06/15/17 1942  Weight: 89.8 kg (198 lb) 89.7 kg (197 lb 12 oz)     REVIEW OF SYSTEMS  As per history otherwise all reviewed and reported negative  Exam:  General exam: Lying in the bed, awake, alert, no apparent distress, following simple commands.  Remains aphasic. Respiratory system: Clear. No increased work of breathing. Cardiovascular system: S1 & S2 heard. No JVD, murmurs, gallops, clicks or pedal edema. Gastrointestinal system: Abdomen is nondistended, soft and nontender. Normal bowel sounds heard. Central nervous system: Alert.  Pronounced right hemiparesis involving the right upper extremity, able to move the right lower extremity.  Normal gaze tracking.  Speech, language and comprehension deficits noted. Extremities: no CCE.  Data Reviewed: Basic Metabolic Panel: Recent Labs  Lab 06/15/17 1430 06/15/17 1441 06/16/17 0632  NA 141 141 140  K 3.7 3.5 3.7  CL 100* 100* 101  CO2 30  --  31  GLUCOSE 144* 140* 127*  BUN 17 17 13   CREATININE 0.78 0.80 0.57  CALCIUM 9.1  --  8.8*   Liver Function Tests: Recent Labs  Lab 06/15/17 1430 06/16/17 0632  AST 22 20  ALT 17 14  ALKPHOS 85 71  BILITOT 0.7 0.8  PROT 7.2 6.4*  ALBUMIN 3.6 3.1*   No results for  input(s): LIPASE, AMYLASE in the last 168 hours. No results for input(s): AMMONIA in the last 168 hours. CBC: Recent Labs  Lab 06/15/17 1430 06/15/17 1441  WBC 10.1  --   NEUTROABS 6.1  --   HGB 14.3 15.6*  HCT 44.3 46.0  MCV 87.2  --   PLT 264  --    Cardiac Enzymes: No results for input(s): CKTOTAL, CKMB, CKMBINDEX, TROPONINI in the last 168 hours. CBG (last 3)  Recent Labs    06/16/17 0045 06/16/17 0415  06/16/17 0822  GLUCAP 93 124* 119*   No results found for this or any previous visit (from the past 240 hour(s)).   Studies: Ct Head Wo Contrast  Result Date: 06/15/2017 CLINICAL DATA:  Difficulty speaking since yesterday EXAM: CT HEAD WITHOUT CONTRAST TECHNIQUE: Contiguous axial images were obtained from the base of the skull through the vertex without intravenous contrast. COMPARISON:  11/29/2016 FINDINGS: Brain: There is no evidence for acute hemorrhage, hydrocephalus, mass lesion, or abnormal extra-axial fluid collection. No definite CT evidence for acute infarction. Diffuse loss of parenchymal volume is consistent with atrophy. Patchy low attenuation in the deep hemispheric and periventricular white matter is nonspecific, but likely reflects chronic microvascular ischemic demyelination. Vascular: No hyperdense vessel or unexpected calcification. Skull: No evidence for fracture. No worrisome lytic or sclerotic lesion. Sinuses/Orbits: The visualized paranasal sinuses and mastoid air cells are clear. Visualized portions of the globes and intraorbital fat are unremarkable. Other: None. IMPRESSION: Stable.  No acute intracranial abnormality Atrophy with chronic small vessel white matter ischemic disease. Electronically Signed   By: Misty Stanley M.D.   On: 06/15/2017 15:03   Mr Brain Wo Contrast  Result Date: 06/16/2017 CLINICAL DATA:  Difficulty speaking. Unable to write with the right hand. EXAM: MRI HEAD WITHOUT CONTRAST MRA HEAD WITHOUT CONTRAST TECHNIQUE: Multiplanar, multiecho pulse sequences of the brain and surrounding structures were obtained without intravenous contrast. Angiographic images of the head were obtained using MRA technique without contrast. COMPARISON:  Head CT 06/15/2017 and MRI 11/29/2016 FINDINGS: MRI HEAD FINDINGS The patient could not tolerate the complete examination. Sagittal T1, axial and coronal diffusion, and axial T2 sequences were obtained and are motion degraded,  severely so on the sagittal T1 sequence. Brain: There is a moderate-sized acute MCA infarct involving the posterior left frontal lobe and perirolandic region. No associated hemorrhage is identified on this limited study. No mass, midline shift, or extra-axial fluid collection is seen. There is mild generalized cerebral atrophy. Chronic lacunar infarcts are present in the deep gray nuclei and deep cerebral white matter bilaterally as well as in the right cerebellum. Patchy to confluent T2 hyperintensities elsewhere in the cerebral white matter bilaterally are similar to the prior MRI and compatible with moderately advanced chronic small vessel ischemic disease. Vascular: Major intracranial vascular flow voids are preserved. Skull and upper cervical spine: No suspicious marrow lesion identified. Sinuses/Orbits: Bilateral cataract extraction. Minimal mucosal thickening in the paranasal sinuses. Clear mastoid air cells. Other: None. MRA HEAD FINDINGS There is intermittent severe motion artifact. The visualized distal vertebral arteries are patent and codominant. The vertebrobasilar junction and proximal half of the basilar artery are completely obscured by motion artifact. The distal basilar artery is widely patent. Posterior communicating arteries are diminutive or absent. The P1 segments are patent, however artifact results in nondiagnostic evaluation of the more distal PCAs. The horizontal petrous segments of both ICAs are completely obscured by motion artifact. The intracranial ICAs are patent proximal and distal to this without  evidence of high-grade stenosis. The proximal left M1 segment is patent, however motion artifact obscures the distal M1 segment and MCA bifurcation and patency at this level cannot be established. There are multiple missing left MCA branch vessels, particularly in the superior division. The right MCA and both ACAs are grossly patent. No large aneurysm is identified. IMPRESSION: 1. Severely  motion degraded, incomplete examination. 2. Moderate-sized left MCA infarct primarily involving the posterolateral frontal lobe. 3. Moderately advanced chronic small vessel ischemic disease. 4. Limited MRA due to motion. Patent proximal left M1 segment with obscured distal M1 segment and bifurcation. Multiple missing left MCA branch vessels, particularly in the superior division. Electronically Signed   By: Logan Bores M.D.   On: 06/16/2017 08:21   US Carotid Bilateral (at [facility] And [facility] Only)  Result Date: 06/16/2017 CLINICAL DATA:  Acute left MCA distribution cerebral infarction. History of hypertension and hyperlipidemia. EXAM: BILATERAL CAROTID DUPLEX ULTRASOUND TECHNIQUE: Pearline Cables scale imaging, color Doppler and duplex ultrasound were performed of bilateral carotid and vertebral arteries in the neck. COMPARISON:  None. FINDINGS: Criteria: Quantification of carotid stenosis is based on velocity parameters that correlate the residual internal carotid diameter with NASCET-based stenosis levels, using the diameter of the distal internal carotid lumen as the denominator for stenosis measurement. The following velocity measurements were obtained: RIGHT ICA:  52/18 cm/sec CCA:  40/10 cm/sec SYSTOLIC ICA/CCA RATIO:  0.8 DIASTOLIC ICA/CCA RATIO:  1.5 ECA:  72 cm/sec LEFT ICA:  60/16 cm/sec CCA:  27/25 cm/sec SYSTOLIC ICA/CCA RATIO:  0.9 DIASTOLIC ICA/CCA RATIO:  1.5 ECA:  131 cm/sec RIGHT CAROTID ARTERY: Minimal partially calcified plaque at the level of the distal bulb and proximal ICA. Velocities and waveforms are normal and estimated right ICA stenosis is less than 50%. The right internal carotid artery is mildly tortuous. RIGHT VERTEBRAL ARTERY: Antegrade flow with normal waveform and velocity. LEFT CAROTID ARTERY: There is a mild amount of eccentric calcified plaque at the level of the carotid bulb and proximal left ICA. Velocities and waveforms are unremarkable and estimated left ICA stenosis is less than 50%. The  left ICA is moderately tortuous. LEFT VERTEBRAL ARTERY: Antegrade flow with normal waveform and velocity. IMPRESSION: No significant carotid stenosis identified in the neck bilaterally. Estimated bilateral ICA stenoses are less than 50%. Minimal to mild plaque present, as above. Electronically Signed   By: Aletta Edouard M.D.   On: 06/16/2017 08:54   Mr Jodene Nam Head/brain DG Cm  Result Date: 06/16/2017 CLINICAL DATA:  Difficulty speaking. Unable to write with the right hand. EXAM: MRI HEAD WITHOUT CONTRAST MRA HEAD WITHOUT CONTRAST TECHNIQUE: Multiplanar, multiecho pulse sequences of the brain and surrounding structures were obtained without intravenous contrast. Angiographic images of the head were obtained using MRA technique without contrast. COMPARISON:  Head CT 06/15/2017 and MRI 11/29/2016 FINDINGS: MRI HEAD FINDINGS The patient could not tolerate the complete examination. Sagittal T1, axial and coronal diffusion, and axial T2 sequences were obtained and are motion degraded, severely so on the sagittal T1 sequence. Brain: There is a moderate-sized acute MCA infarct involving the posterior left frontal lobe and perirolandic region. No associated hemorrhage is identified on this limited study. No mass, midline shift, or extra-axial fluid collection is seen. There is mild generalized cerebral atrophy. Chronic lacunar infarcts are present in the deep gray nuclei and deep cerebral white matter bilaterally as well as in the right cerebellum. Patchy to confluent T2 hyperintensities elsewhere in the cerebral white matter bilaterally are similar to the prior MRI  and compatible with moderately advanced chronic small vessel ischemic disease. Vascular: Major intracranial vascular flow voids are preserved. Skull and upper cervical spine: No suspicious marrow lesion identified. Sinuses/Orbits: Bilateral cataract extraction. Minimal mucosal thickening in the paranasal sinuses. Clear mastoid air cells. Other: None. MRA HEAD  FINDINGS There is intermittent severe motion artifact. The visualized distal vertebral arteries are patent and codominant. The vertebrobasilar junction and proximal half of the basilar artery are completely obscured by motion artifact. The distal basilar artery is widely patent. Posterior communicating arteries are diminutive or absent. The P1 segments are patent, however artifact results in nondiagnostic evaluation of the more distal PCAs. The horizontal petrous segments of both ICAs are completely obscured by motion artifact. The intracranial ICAs are patent proximal and distal to this without evidence of high-grade stenosis. The proximal left M1 segment is patent, however motion artifact obscures the distal M1 segment and MCA bifurcation and patency at this level cannot be established. There are multiple missing left MCA branch vessels, particularly in the superior division. The right MCA and both ACAs are grossly patent. No large aneurysm is identified. IMPRESSION: 1. Severely motion degraded, incomplete examination. 2. Moderate-sized left MCA infarct primarily involving the posterolateral frontal lobe. 3. Moderately advanced chronic small vessel ischemic disease. 4. Limited MRA due to motion. Patent proximal left M1 segment with obscured distal M1 segment and bifurcation. Multiple missing left MCA branch vessels, particularly in the superior division. Electronically Signed   By: Logan Bores M.D.   On: 06/16/2017 08:21   Scheduled Meds: . aspirin  300 mg Rectal Daily   Or  . aspirin  325 mg Oral Daily  . atorvastatin  40 mg Oral q1800  . citalopram  10 mg Oral Daily  . insulin aspart  0-9 Units Subcutaneous TID WC  . vitamin B-12  1,000 mcg Oral Daily   Continuous Infusions:  Principal Problem:   Stroke [facility]) Active Problems:   Essential hypertension   Type 2 diabetes mellitus with neurological complications ([place])   Hyperlipidemia associated with type 2 diabetes mellitus ([place])  Time spent:     Irwin Brakeman, MD, FAAFP [facility] Pager [phone]  If 7PM-7AM, please contact night-coverage [email or web address] Password [email or web address] 06/16/2017, 10:23 AM    LOS: 1 day

## 2017-06-16 NOTE — Plan of Care (Signed)
  No Outcome Acute Rehab PT Goals(only PT should resolve) Pt Will Go Supine/Side To Sit 06/16/2017 1013 by Geralyn Corwin, PT Flowsheets Taken 06/16/2017 1013  Pt will go Supine/Side to Sit with modified independence Pt Will Go Sit To Supine/Side 06/16/2017 1013 by Geralyn Corwin, PT Flowsheets Taken 06/16/2017 1013  Pt will go Sit to Supine/Side with modified independence Patient Will Transfer Sit To/From Stand 06/16/2017 1013 by [place], [place] Taken 06/16/2017 1013  Patient will transfer sit to/from stand with supervision;with modified independence Pt Will Transfer Bed To Chair/Chair To Bed 06/16/2017 1013 by Powell, [place] Taken 06/16/2017 1013  Pt will Transfer Bed to Chair/Chair to Bed with supervision;with modified independence Pt Will Ambulate 06/16/2017 1013 by Powell, [place] Taken 06/16/2017 1013  Pt will Ambulate > 125 feet;with supervision;with cane    Geraldine Solar PT, DPT

## 2017-06-16 NOTE — Progress Notes (Signed)
Pt arrived back from testing.

## 2017-06-16 NOTE — Clinical Social Work Note (Signed)
PT/OT evals recommended outpatient PT.   LCSW signing off.     Archer Vise, Clydene Pugh, LCSW

## 2017-06-16 NOTE — Progress Notes (Signed)
Patient is presently resting in bed. Pleasant mood, nonverbal at this time but smiles and makes friendly gestures. She is able to follow simple commands but unable to verbalize needs. She has ambulated to the bathroom alongside staff. Repositions self in bed. No new Neuro deficits observed at this time.

## 2017-06-16 NOTE — Plan of Care (Signed)
  No Outcome SLP Language Goals Patient will communicate needs/wants with 06/16/2017 1814 by Ephraim Hamburger, [place] Taken 06/16/2017 1814  Patient will communicate ____  needs/wants with basic;max assist Patient will follow commands during a functional ADL with 06/16/2017 1814 by Ephraim Hamburger, [place] Taken 06/16/2017 1814  Patient will follow ____ commands during a functional ADL with ____ 1 step;max assist

## 2017-06-16 NOTE — Progress Notes (Signed)
Transport staff here to pick up patient for MRI/MRA, and US Carotids. Will continue to monitor.

## 2017-06-16 NOTE — Evaluation (Addendum)
Physical Therapy Evaluation Patient Details Name: Sandra [name] MRN: [id number] DOB: 1941-02-07 Today's Date: 06/16/2017   History of Present Illness  Sandra [name]  is a 77 y.o. female, w hypertension, hyperlipidemia, Dm2, CVA,  apparently not taking bp medication for the past 2 weeks presents with dysarthria.  Pt is not taking aspirin at home. Per teleneurology last known well 1200 on 06/14/2017.  Pt has difficulty with comprehension as well it appears.    Clinical Impression  Pt received from OT in chair and was agreeable to PT evaluation. PTA, pt able to ambualte HH distances without AD and short community distances with SPC, independent with ADLs. Pt currently presents with receptive aphasia as she has difficulty following verbal commands and tactile cueing. She also demostrates deficits in gross and fine motor coordination of RLE (though this could have been due to difficulty following commands due to her receptive aphasia), mild impulsivity with transfers, unsteadiness and R drift during gait with SPC. Pt min guard for transfers when able to use BUE support, however, when transferring from the bed without sturdy UE support (i.e. chair rails), pt required min A to complete transfers and had increased unsteadiness. Pt demo'ing increased unsteadiness during gait and tended to drift R; no ataxia noted. Pt able to ambulate 282ft but her unsteadiness increased as distance increased. Recommend OPPT upon d/c to address deficits in strength, coordination, balance, and gait in order to decrease risk for falls and maximize independence at home. Will continue to follow acutely.    Follow Up Recommendations Outpatient PT;Supervision/Assistance - 24 hour    Equipment Recommendations  None recommended by PT    Recommendations for Other Services       Precautions / Restrictions Precautions Precautions: Fall Precaution Comments: generally unsteady gait, difficulty with transfers without stable  surface for UE to use Restrictions Weight Bearing Restrictions: No      Mobility  Bed Mobility Overal bed mobility: Needs Assistance Bed Mobility: Supine to Sit     Supine to sit: Supervision     General bed mobility comments: received in chair and returned to chair  Transfers Overall transfer level: Needs assistance Equipment used: Straight cane;None Transfers: Sit to/from Stand;Stand Pivot Transfers Sit to Stand: Min guard;Min assist Stand pivot transfers: Min guard       General transfer comment: Required min A for STS from bed as she did not have chair rails to support her; pt had difficulty placing RUE on bed and it kept slipping off which affected her balance during transfers; mild impulsivity noted; min guard from chair with BUE on chair rails  Ambulation/Gait Ambulation/Gait assistance: Min guard;Min assist Ambulation Distance (Feet): 200 Feet Assistive device: Straight cane Gait Pattern/deviations: Step-through pattern;Staggering right;Drifts right/left   Gait velocity interpretation: <1.8 ft/sec, indicative of risk for recurrent falls General Gait Details: Drifted/staggered R intermittently during gait; increased unsteadiness as distance increased; 1 stumble towards end of gait which required min A   Stairs            Wheelchair Mobility    Modified Rankin (Stroke Patients Only)       Balance Overall balance assessment: Needs assistance Sitting-balance support: Feet supported Sitting balance-Leahy Scale: Good     Standing balance support: Single extremity supported Standing balance-Leahy Scale: Fair Standing balance comment: drift/stagger R during gait                             Pertinent Vitals/Pain Pain Assessment: Faces  Faces Pain Scale: No hurt    Home Living Family/patient expects to be discharged to:: Private residence Living Arrangements: Children(son, dtr-in-law, 2 grandchildren) Available Help at Discharge:  Family Type of Home: House Home Access: Stairs to enter Entrance Stairs-Rails: None Entrance Stairs-Number of Steps: 4 Home Layout: Two level;Laundry or work area in basement;Able to live on main level with bedroom/bathroom Home Equipment: Radio producer - single point;Grab bars - tub/shower      Prior Function Level of Independence: Independent with assistive device(s)         Comments: no AD for HH distances, SPC for short community distances     Hand Dominance   Dominant Hand: Right    Extremity/Trunk Assessment   Upper Extremity Assessment Upper Extremity Assessment: Defer to OT evaluation RUE Deficits / Details: unable to perform MMT due to receptive difficulties. RUE strength appears grossly 3/5 RUE Coordination: decreased fine motor;decreased gross motor    Lower Extremity Assessment Lower Extremity Assessment: RLE deficits/detail RLE Deficits / Details: MMT generally 4/5; difficulty performing RAMPs and heel to shin, but could have been due to receptive aphasia. However, pt unable to perform even after PT physically demonstrating on pt how to perform heel to shin RLE Coordination: decreased gross motor;decreased fine motor    Cervical / Trunk Assessment Cervical / Trunk Assessment: Normal  Communication   Communication: Receptive difficulties;Expressive difficulties  Cognition Arousal/Alertness: Awake/alert Behavior During Therapy: WFL for tasks assessed/performed Overall Cognitive Status: Impaired/Different from baseline Area of Impairment: Following commands                               General Comments: receptive and expressive aphasia; difficulty following commands      General Comments      Exercises     Assessment/Plan    PT Assessment Patient needs continued PT services  PT Problem List Decreased strength;Decreased balance;Decreased coordination;Decreased cognition;Decreased knowledge of use of DME;Decreased safety awareness;Decreased  knowledge of precautions       PT Treatment Interventions DME instruction;Gait training;Stair training;Functional mobility training;Therapeutic activities;Therapeutic exercise;Balance training;Neuromuscular re-education;Patient/family education    PT Goals (Current goals can be found in the Care Plan section)  Acute Rehab PT Goals Patient Stated Goal: none verbalized by patient    Frequency 7X/week   Barriers to discharge        Co-evaluation               AM-PAC PT "6 Clicks" Daily Activity  Outcome Measure     06/16/17 0958  PT - End of Session  Equipment Utilized During Treatment Gait belt  Activity Tolerance Patient tolerated treatment well  Patient left in chair;with call bell/phone within reach;with chair alarm set  Nurse Communication Mobility status  PT Assessment  PT Recommendation/Assessment Patient needs continued PT services  PT Visit Diagnosis Unsteadiness on feet (R26.81);Other abnormalities of gait and mobility (R26.89);Muscle weakness (generalized) (M62.81);Difficulty in walking, not elsewhere classified (R26.2)  PT Problem List Decreased strength;Decreased balance;Decreased coordination;Decreased cognition;Decreased knowledge of use of DME;Decreased safety awareness;Decreased knowledge of precautions  PT Plan  PT Frequency (ACUTE ONLY) 7X/week  PT Treatment/Interventions (ACUTE ONLY) DME instruction;Gait training;Stair training;Functional mobility training;Therapeutic activities;Therapeutic exercise;Balance training;Neuromuscular re-education;Patient/family education  AM-PAC PT "6 Clicks" Daily Activity Outcome Measure  Difficulty turning over in bed (including adjusting bedclothes, sheets and blankets)? 3  Difficulty moving from lying on back to sitting on the side of the bed?  3  Difficulty sitting down on and standing up  from a chair with arms (e.g., wheelchair, bedside commode, etc,.)? 3  Help needed moving to and from a bed to chair (including a  wheelchair)? 3  Help needed walking in hospital room? 3  Help needed climbing 3-5 steps with a railing?  2  6 Click Score 17  Mobility G Code  CK  PT Recommendation  Follow Up Recommendations Outpatient PT;Supervision/Assistance - 24 hour  PT equipment None recommended by PT  Individuals Consulted  Consulted and Agree with Results and Recommendations Patient                   End of Session Equipment Utilized During Treatment: Gait belt Activity Tolerance: Patient tolerated treatment well Patient left: in chair;with call bell/phone within reach;with chair alarm set Nurse Communication: Mobility status PT Visit Diagnosis: Unsteadiness on feet (R26.81);Other abnormalities of gait and mobility (R26.89);Muscle weakness (generalized) (M62.81);Difficulty in walking, not elsewhere classified (R26.2)    Time: 1537-9432 PT Time Calculation (min) (ACUTE ONLY): 13 min   Charges:   PT Evaluation $PT Eval Low Complexity: 1 Low            Geraldine Solar PT, DPT    Addended on June 25, 2017 to delete G-codes and add 6-Clicks Geraldine Solar PT, DPT

## 2017-06-16 NOTE — Evaluation (Signed)
Occupational Therapy Evaluation Patient Details Name: Sandra [name] MRN: [id number] DOB: 1940-08-19 Today's Date: 06/16/2017    History of Present Illness Sandra [name]  is a 77 y.o. female, w hypertension, hyperlipidemia, Dm2, CVA,  apparently not taking bp medication for the past 2 weeks presents with dysarthria.  Pt is not taking aspirin at home. Per teleneurology last known well 1200 on 06/14/2017.  Pt has difficulty with comprehension as well it appears.  MRI positive for left MCA infarct.   Clinical Impression   Pt received sitting up in bed attempting to eat breakfast. Pt requiring assistance with opening packages and containers due to right hand coordination deficits. During evaluation OT unable to perform formal sensation testing, MMT, vision testing, or coordination testing due to receptive difficulties. Pt is able to follow simple one-step commands combined with visual cuing, however this is inconsistent throughout session. Pt seemingly unaware of spillage from right side of mouth, OT offered napkin to wipe mouth with and pt placed to the side without using. OT noting RUE coordination deficits, gross and fine, throughout session with functional tasks. Recommend outpatient OT services on discharge to improve strength, coordination, and improve independence and safety during ADL completion.  Also recommend 24/7 supervision on discharge for pt safety.     Follow Up Recommendations  Outpatient OT;Supervision/Assistance - 24 hour    Equipment Recommendations  Tub/shower seat       Precautions / Restrictions Precautions Precautions: Fall Precaution Comments: generally unsteady gait, difficulty with transfers without stable surface for UE to use Restrictions Weight Bearing Restrictions: No      Mobility Bed Mobility Overal bed mobility: Needs Assistance Bed Mobility: Supine to Sit     Supine to sit: Supervision        Transfers Overall transfer level: Needs  assistance Equipment used: Straight cane Transfers: Sit to/from Stand;Stand Pivot Transfers Sit to Stand: Min guard Stand pivot transfers: Min guard       General transfer comment: visual cuing for standing and then turning to sit in chair        ADL either performed or assessed with clinical judgement   ADL Overall ADL's : Needs assistance/impaired Eating/Feeding: Set up;Sitting Eating/Feeding Details (indicate cue type and reason): Pt unable to open containers and packaging due to strength and coordination deficits in right hand. OT notes pt with difficulty grasping straw and bringing to mouth             Upper Body Dressing : Minimal assistance;Sitting Upper Body Dressing Details (indicate cue type and reason): Pt requiring assistance for managing gown ties/buttons Lower Body Dressing: Min guard;Sitting/lateral leans Lower Body Dressing Details (indicate cue type and reason): Pt able to donn socks using left hand primarily while seated at EOB             Functional mobility during ADLs: Min guard;Cane General ADL Comments: Pt requiring increased level of assistance for ADLs due to right hemiplegia and coordination deficits     Vision Baseline Vision/History: No visual deficits Patient Visual Report: No change from baseline Vision Assessment?: No apparent visual deficits Additional Comments: No vision impairments from OT observation, unable to formally test due to receptive aphasia            Pertinent Vitals/Pain Pain Assessment: Faces Faces Pain Scale: No hurt     Hand Dominance Right   Extremity/Trunk Assessment Upper Extremity Assessment Upper Extremity Assessment: RUE deficits/detail RUE Deficits / Details: unable to perform MMT due to receptive difficulties. RUE strength appears  grossly 3/5 RUE Coordination: decreased fine motor;decreased gross motor   Lower Extremity Assessment Lower Extremity Assessment: Defer to PT evaluation   Cervical / Trunk  Assessment Cervical / Trunk Assessment: Normal   Communication Communication Communication: Receptive difficulties;Expressive difficulties   Cognition Arousal/Alertness: Awake/alert Behavior During Therapy: WFL for tasks assessed/performed Overall Cognitive Status: Impaired/Different from baseline Area of Impairment: Following commands                               General Comments: receptive and expressive aphasia; difficulty following commands              Home Living Family/patient expects to be discharged to:: Private residence Living Arrangements: Children(son, dtr-in-law, 2 grandchildren) Available Help at Discharge: Family Type of Home: House Home Access: Stairs to enter Technical brewer of Steps: 4 Entrance Stairs-Rails: None Home Layout: Two level;Laundry or work area in basement;Able to live on main level with bedroom/bathroom Alternate Therapist, sports of Steps: 20 steps to second level; 12 to basement   Bathroom Shower/Tub: Teacher, early years/pre: Standard     Home Equipment: Cane - single point;Grab bars - tub/shower          Prior Functioning/Environment Level of Independence: Independent with assistive device(s)        Comments: no AD for HH distances, SPC for short community distances        OT Problem List: Decreased strength;Decreased activity tolerance;Impaired balance (sitting and/or standing);Decreased coordination;Decreased safety awareness;Impaired UE functional use      OT Treatment/Interventions: Self-care/ADL training;Therapeutic exercise;Neuromuscular education;DME and/or AE instruction;Therapeutic activities;Patient/family education    OT Goals(Current goals can be found in the care plan section) Acute Rehab OT Goals Patient Stated Goal: none stated OT Goal Formulation: With patient Time For Goal Achievement: 06/30/17 Potential to Achieve Goals: Good  OT Frequency: Min 2X/week                  End of Session Equipment Utilized During Treatment: Gait belt(cane)  Activity Tolerance: Patient tolerated treatment well Patient left: in chair;with call bell/phone within reach(with PT)  OT Visit Diagnosis: Hemiplegia and hemiparesis;Muscle weakness (generalized) (M62.81) Hemiplegia - Right/Left: Right Hemiplegia - dominant/non-dominant: Dominant Hemiplegia - caused by: Cerebral infarction                Time: 4492-0100 OT Time Calculation (min): 30 min Charges:  OT General Charges $OT Visit: 1 Visit OT Evaluation $OT Eval Low Complexity: 1 Low    Guadelupe Sabin, OTR/L  [phone] 06/16/2017, 9:47 AM

## 2017-06-16 NOTE — Progress Notes (Signed)
*  PRELIMINARY RESULTS* Echocardiogram 2D Echocardiogram has been performed.  Leavy Cella 06/16/2017, 11:02 AM

## 2017-06-16 NOTE — Progress Notes (Signed)
OT Cancellation Note  Patient Details Name: Sandra [name] MRN: [id number] DOB: 1941-05-09   Cancelled Treatment:    Reason Eval/Treat Not Completed: Patient at procedure or test/ unavailable. Pt leaving for MRI/ultrasound on OT arrival. Pt able to roll onto gurney without difficulty. Will attempt to see at a later time.    Guadelupe Sabin, OTR/L  [phone] 06/16/2017, 7:32 AM

## 2017-06-16 NOTE — H&P (Signed)
[place] A. Sandra Laughter, MD     [email or web address]          Sandra [name] is an 77 y.o. female.   ASSESSMENT/PLAN: 1. Subacute left hemispheric stroke: Risk factors hypertension, diabetes, age, peripheral vascular disease and dyslipidemia. I suspect that the etiology is from intracranial thromboembolic phenomena but the MRA is significantly degraded by motion artifact. Consequent, repeat imaging with a head CTA will be obtained.I recommend dual antiplatelet agents aspirin/Plavix for 3 months. Subsequently, single agent can be used. I think we should increase the patient's statin given her LDL 150.       Patient is an 77 year old white female who presents with that two week history of apparent dysarthria.  She apparently has not been taking her baseline medications for the past three weeks.  She also appears to have had some language impairment possibly based upon the evaluation by the admitting physician and the emergency room physician.  The language impairment seems mostly difficulty understanding over the last couple of days. The review of systems is not obtainable given the patient'pairment.    GENERAL: This is a pleasant obese female who is sitting up in a chair. She is in no acute distress.  HEENT: normal  ABDOMEN: soft  EXTREMITIES: No edema   BACK: normal  SKIN: Normal by inspection.    MENTAL STATUS: She is dosing on entering opens her eyes to verbal commands -1. She however does not follow other commands -2. 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   CRANIAL NERVES: Pupils are equal, round and reactive to light and accomodation; extra ocular movements are full, there is no significant nystagmus; visual fields reveals a dense right homonymous hemianopia -2; upper and lower facial muscles are normal in strength and symmetric, there is no flattening of the nasolabial folds.  MOTOR: The strength is difficult to attain given the profound  aphasia. She is appears to have at least 4/5 strength in the upper extremities and 3/5 in the legs at the least. She does have a mild drift of the left leg - 1 and a more profound drift involving the right leg -2. There is no drift of the upper extremities.  COORDINATION: no dysmetria appreciated. No tremors.  REFLEXES: Deep tendon reflexes are symmetrical and normal.    SENSATION: There is reduced response to pain on the right -2. No clear extinction appreciated.    Area stroke scale 1, 2, 3, 2, 1, 2, 2, total 13.    Blood pressure (!) 164/83, pulse 66, temperature 98.2 F (36.8 C), temperature source Oral, resp. rate 18, height 5' 5" (1.651 m), weight 197 lb 12 oz (89.7 kg), SpO2 97 %.  Past Medical History:  Diagnosis Date  . Colon polyp   . Constipation   . Diabetes mellitus without complication ([place])   . GI bleed   . Hemorrhoids   . High cholesterol   . Hypertension   . PAD (peripheral artery disease) ([place])   . Peripheral neuropathy   . Rectal bleeding    "for 7 years"  . Rectal bleeding   . Stroke [facility])     Past Surgical History:  Procedure Laterality Date  . ABDOMINAL HYSTERECTOMY    . COLONOSCOPY     X 7  . COLONOSCOPY N/A 03/21/2017   Procedure: COLONOSCOPY;  Surgeon: Danie Binder, MD;  Location: [facility];  Service: Endoscopy;  Laterality: N/A;  1215     Family History  Problem Relation  Age of Onset  . Diabetes Mother   . Cancer Father   . Diabetes Sister   . Diabetes Brother   . Diabetes Daughter   . Diabetes Maternal Grandmother   . Diabetes Daughter   . Colon cancer Neg Hx     Social History:  reports that  has never smoked. she has never used smokeless tobacco. She reports that she does not drink alcohol or use drugs.  Allergies:  Allergies  Allergen Reactions  . Hctz [Hydrochlorothiazide] Other (See Comments)    "gave me pancreatitis"  . Morphine And Related     "made room spin"  . Valium [Diazepam] Other (See Comments)     Confusion, blurred vision    Medications: Prior to Admission medications   Medication Sig Start Date End Date Taking? Authorizing Provider  amLODipine (NORVASC) 10 MG tablet Take 10 mg by mouth daily.   Yes [provider]  aspirin EC 81 MG tablet Take 81 mg by mouth daily.   Yes [provider]  citalopram (CELEXA) 10 MG tablet Take 10 mg by mouth daily.   Yes [provider]  ergocalciferol (DRISDOL) 8000 UNIT/ML drops Take by mouth daily. Takes 2 drops daily   Yes [provider]  LANTUS 100 UNIT/ML injection Inject 0.38 mLs (38 Units total) at bedtime into the skin. 03/21/17  Yes Hagler, Apolonio Schneiders, MD  losartan (COZAAR) 100 MG tablet Take 100 mg by mouth daily.   Yes [provider]  metFORMIN (GLUCOPHAGE) 500 MG tablet Take 500 mg by mouth 2 (two) times daily with a meal.   Yes [provider]  metoprolol succinate (TOPROL-XL) 100 MG 24 hr tablet Take 1 tablet (100 mg total) by mouth daily. Take with or immediately following a meal. 05/30/17  Yes Hagler, Apolonio Schneiders, MD  Multiple Vitamins-Minerals (ONE-A-DAY 50 PLUS PO) Take 1 tablet by mouth daily.   Yes [provider]  olmesartan (BENICAR) 40 MG tablet Take 1 tablet (40 mg total) by mouth daily. 06/14/17  Yes Hagler, Apolonio Schneiders, MD  Omega-3 Fatty Acids (FISH OIL) 1200 MG CAPS Take 1 capsule by mouth daily.   Yes [provider]  polyethylene glycol powder (GLYCOLAX/MIRALAX) powder Take 17 g by mouth as needed for mild constipation or moderate constipation.    Yes [provider]  simvastatin (ZOCOR) 20 MG tablet Take 20 mg by mouth daily at 6 PM.   Yes [provider]  vitamin B-12 (CYANOCOBALAMIN) 1000 MCG tablet Take 1,000 mcg by mouth daily.   Yes [provider]  hydrocortisone (ANUSOL-HC) 2.5 % rectal cream Apply rectally 2 times daily Patient not taking: Reported on 05/24/2017 11/04/16   [name], MD    Scheduled Meds: . aspirin  300 mg Rectal  Daily   Or  . aspirin  325 mg Oral Daily  . atorvastatin  40 mg Oral q1800  . citalopram  10 mg Oral Daily  . insulin aspart  0-9 Units Subcutaneous TID WC  . vitamin B-12  1,000 mcg Oral Daily   Continuous Infusions: PRN Meds:.acetaminophen **OR** acetaminophen (TYLENOL) oral liquid 160 mg/5 mL **OR** acetaminophen     Results for orders placed or performed during the hospital encounter of 06/15/17 (from the past 48 hour(s))  Ethanol     Status: None   Collection Time: 06/15/17  2:30 PM  Result Value Ref Range   Alcohol, Ethyl (B) <10 <10 mg/dL    Comment:        LOWEST DETECTABLE LIMIT FOR  SERUM ALCOHOL IS 10 mg/dL FOR MEDICAL PURPOSES ONLY   Protime-INR     Status: None   Collection Time: 06/15/17  2:30 PM  Result Value Ref Range   Prothrombin Time 14.0 11.4 - 15.2 seconds   INR 1.09   APTT     Status: None   Collection Time: 06/15/17  2:30 PM  Result Value Ref Range   aPTT 33 24 - 36 seconds  CBC     Status: None   Collection Time: 06/15/17  2:30 PM  Result Value Ref Range   WBC 10.1 4.0 - 10.5 K/uL   RBC 5.08 3.87 - 5.11 MIL/uL   Hemoglobin 14.3 12.0 - 15.0 g/dL   HCT 44.3 36.0 - 46.0 %   MCV 87.2 78.0 - 100.0 fL   MCH 28.1 26.0 - 34.0 pg   MCHC 32.3 30.0 - 36.0 g/dL   RDW 14.2 11.5 - 15.5 %   Platelets 264 150 - 400 K/uL  Differential     Status: None   Collection Time: 06/15/17  2:30 PM  Result Value Ref Range   Neutrophils Relative % 61 %   Neutro Abs 6.1 1.7 - 7.7 K/uL   Lymphocytes Relative 28 %   Lymphs Abs 2.8 0.7 - 4.0 K/uL   Monocytes Relative 10 %   Monocytes Absolute 1.0 0.1 - 1.0 K/uL   Eosinophils Relative 1 %   Eosinophils Absolute 0.1 0.0 - 0.7 K/uL   Basophils Relative 0 %   Basophils Absolute 0.0 0.0 - 0.1 K/uL  Comprehensive metabolic panel     Status: Abnormal   Collection Time: 06/15/17  2:30 PM  Result Value Ref Range   Sodium 141 135 - 145 mmol/L   Potassium 3.7 3.5 - 5.1 mmol/L   Chloride 100 (L) 101 - 111 mmol/L   CO2 30 22  - 32 mmol/L   Glucose, Bld 144 (H) 65 - 99 mg/dL   BUN 17 6 - 20 mg/dL   Creatinine, Ser 0.78 0.44 - 1.00 mg/dL   Calcium 9.1 8.9 - 10.3 mg/dL   Total Protein 7.2 6.5 - 8.1 g/dL   Albumin 3.6 3.5 - 5.0 g/dL   AST 22 15 - 41 U/L   ALT 17 14 - 54 U/L   Alkaline Phosphatase 85 38 - 126 U/L   Total Bilirubin 0.7 0.3 - 1.2 mg/dL   GFR calc non Af Amer >60 >60 mL/min   GFR calc Af Amer >60 >60 mL/min    Comment: (NOTE) The eGFR has been calculated using the CKD EPI equation. This calculation has not been validated in all clinical situations. eGFR's persistently <60 mL/min signify possible Chronic Kidney Disease.    Anion gap 11 5 - 15  Urine rapid drug screen (hosp performed)     Status: None   Collection Time: 06/15/17  2:35 PM  Result Value Ref Range   Opiates NONE DETECTED NONE DETECTED   Cocaine NONE DETECTED NONE DETECTED   Benzodiazepines NONE DETECTED NONE DETECTED   Amphetamines NONE DETECTED NONE DETECTED   Tetrahydrocannabinol NONE DETECTED NONE DETECTED   Barbiturates NONE DETECTED NONE DETECTED  Urinalysis, Routine w reflex microscopic     Status: Abnormal   Collection Time: 06/15/17  2:35 PM  Result Value Ref Range   Color, Urine YELLOW YELLOW   APPearance HAZY (A) CLEAR   Specific Gravity, Urine 1.018 1.005 - 1.030   pH 6.0 5.0 - 8.0   Glucose, UA NEGATIVE NEGATIVE mg/dL   Hgb  urine dipstick NEGATIVE NEGATIVE   Bilirubin Urine NEGATIVE NEGATIVE   Ketones, ur NEGATIVE NEGATIVE mg/dL   Protein, ur 30 (A) NEGATIVE mg/dL   Nitrite NEGATIVE NEGATIVE   Leukocytes, UA NEGATIVE NEGATIVE   RBC / HPF 0-5 0 - 5 RBC/hpf   WBC, UA 0-5 0 - 5 WBC/hpf   Bacteria, UA NONE SEEN NONE SEEN   Squamous Epithelial / LPF 6-30 (A) NONE SEEN   Mucus PRESENT   I-stat troponin, ED     Status: None   Collection Time: 06/15/17  2:39 PM  Result Value Ref Range   Troponin i, poc 0.02 0.00 - 0.08 ng/mL   Comment 3            Comment: Due to the release kinetics of cTnI, a negative result  within the first hours of the onset of symptoms does not rule out myocardial infarction with certainty. If myocardial infarction is still suspected, repeat the test at appropriate intervals.   I-Stat Chem 8, ED     Status: Abnormal   Collection Time: 06/15/17  2:41 PM  Result Value Ref Range   Sodium 141 135 - 145 mmol/L   Potassium 3.5 3.5 - 5.1 mmol/L   Chloride 100 (L) 101 - 111 mmol/L   BUN 17 6 - 20 mg/dL   Creatinine, Ser 0.80 0.44 - 1.00 mg/dL   Glucose, Bld 140 (H) 65 - 99 mg/dL   Calcium, Ion 1.16 1.15 - 1.40 mmol/L   TCO2 31 22 - 32 mmol/L   Hemoglobin 15.6 (H) 12.0 - 15.0 g/dL   HCT 46.0 36.0 - 46.0 %  Glucose, capillary     Status: None   Collection Time: 06/16/17 12:45 AM  Result Value Ref Range   Glucose-Capillary 93 65 - 99 mg/dL   Comment 1 Notify RN    Comment 2 Document in Chart   Glucose, capillary     Status: Abnormal   Collection Time: 06/16/17  4:15 AM  Result Value Ref Range   Glucose-Capillary 124 (H) 65 - 99 mg/dL   Comment 1 Notify RN    Comment 2 Document in Chart   Lipid panel     Status: Abnormal   Collection Time: 06/16/17  6:32 AM  Result Value Ref Range   Cholesterol 204 (H) 0 - 200 mg/dL   Triglycerides 93 <150 mg/dL   HDL 35 (L) >40 mg/dL   Total CHOL/HDL Ratio 5.8 RATIO   VLDL 19 0 - 40 mg/dL   LDL Cholesterol 150 (H) 0 - 99 mg/dL    Comment:        Total Cholesterol/HDL:CHD Risk Coronary Heart Disease Risk Table                     Men   Women  1/2 Average Risk   3.4   3.3  Average Risk       5.0   4.4  2 X Average Risk   9.6   7.1  3 X Average Risk  23.4   11.0        Use the calculated Patient Ratio above and the CHD Risk Table to determine the patient's CHD Risk.        ATP III CLASSIFICATION (LDL):  <100     mg/dL   Optimal  100-129  mg/dL   Near or Above                    Optimal  130-159  mg/dL   Borderline  160-189  mg/dL   High  >190     mg/dL   Very High   Comprehensive metabolic panel     Status: Abnormal    Collection Time: 06/16/17  6:32 AM  Result Value Ref Range   Sodium 140 135 - 145 mmol/L   Potassium 3.7 3.5 - 5.1 mmol/L   Chloride 101 101 - 111 mmol/L   CO2 31 22 - 32 mmol/L   Glucose, Bld 127 (H) 65 - 99 mg/dL   BUN 13 6 - 20 mg/dL   Creatinine, Ser 0.57 0.44 - 1.00 mg/dL   Calcium 8.8 (L) 8.9 - 10.3 mg/dL   Total Protein 6.4 (L) 6.5 - 8.1 g/dL   Albumin 3.1 (L) 3.5 - 5.0 g/dL   AST 20 15 - 41 U/L   ALT 14 14 - 54 U/L   Alkaline Phosphatase 71 38 - 126 U/L   Total Bilirubin 0.8 0.3 - 1.2 mg/dL   GFR calc non Af Amer >60 >60 mL/min   GFR calc Af Amer >60 >60 mL/min    Comment: (NOTE) The eGFR has been calculated using the CKD EPI equation. This calculation has not been validated in all clinical situations. eGFR's persistently <60 mL/min signify possible Chronic Kidney Disease.    Anion gap 8 5 - 15  Glucose, capillary     Status: Abnormal   Collection Time: 06/16/17  8:22 AM  Result Value Ref Range   Glucose-Capillary 119 (H) 65 - 99 mg/dL    Studies/Results:  CAROTID DOPPLERS IMPRESSION: No significant carotid stenosis identified in the neck bilaterally. Estimated bilateral ICA stenoses are less than 50%. Minimal to mild plaque present, as above.    BRAIN MRI MRA FINDINGS: MRI HEAD FINDINGS  The patient could not tolerate the complete examination. Sagittal T1, axial and coronal diffusion, and axial T2 sequences were obtained and are motion degraded, severely so on the sagittal T1 sequence.  Brain: There is a moderate-sized acute MCA infarct involving the posterior left frontal lobe and perirolandic region. No associated hemorrhage is identified on this limited study. No mass, midline shift, or extra-axial fluid collection is seen. There is mild generalized cerebral atrophy. Chronic lacunar infarcts are present in the deep gray nuclei and deep cerebral white matter bilaterally as well as in the right cerebellum. Patchy to confluent T2 hyperintensities  elsewhere in the cerebral white matter bilaterally are similar to the prior MRI and compatible with moderately advanced chronic small vessel ischemic disease.  Vascular: Major intracranial vascular flow voids are preserved.  Skull and upper cervical spine: No suspicious marrow lesion identified.  Sinuses/Orbits: Bilateral cataract extraction. Minimal mucosal thickening in the paranasal sinuses. Clear mastoid air cells.  Other: None.  MRA HEAD FINDINGS  There is intermittent severe motion artifact.  The visualized distal vertebral arteries are patent and codominant. The vertebrobasilar junction and proximal half of the basilar artery are completely obscured by motion artifact. The distal basilar artery is widely patent. Posterior communicating arteries are diminutive or absent. The P1 segments are patent, however artifact results in nondiagnostic evaluation of the more distal PCAs.  The horizontal petrous segments of both ICAs are completely obscured by motion artifact. The intracranial ICAs are patent proximal and distal to this without evidence of high-grade stenosis. The proximal left M1 segment is patent, however motion artifact obscures the distal M1 segment and MCA bifurcation and patency at this level cannot be established. There are multiple missing left MCA branch vessels, particularly  in the superior division. The right MCA and both ACAs are grossly patent. No large aneurysm is identified.  IMPRESSION: 1. Severely motion degraded, incomplete examination. 2. Moderate-sized left MCA infarct primarily involving the posterolateral frontal lobe. 3. Moderately advanced chronic small vessel ischemic disease. 4. Limited MRA due to motion. Patent proximal left M1 segment with obscured distal M1 segment and bifurcation. Multiple missing left MCA branch vessels, particularly in the superior division         THE THE THE BRAIN MRI MRA REVIEWED IN PERSON.  The MRI  shows a faintly increased signal on DWI involving the left frontal and temporal region.  The faintness suggests more of a subacute onset and not in acute event.  There is corresponding reduced signal seen on the ADC scan.  There is moderate periventricular and deep white matter increased signal consistent with chronic microvascular changes.  His MRA shows dropout of signal involving distal 1/2 of the M1 segment on the left side.  There is also a drug signal involving the distal basilar and PCA bilaterally.  The study is significantly degraded however by movement artifact.     Aloys Hupfer A. Sandra [name], M.D.  Diplomate, Tax adviser of Psychiatry and Neurology ( Neurology). 06/16/2017, 11:31 AM

## 2017-06-17 ENCOUNTER — Inpatient Hospital Stay (HOSPITAL_COMMUNITY): Payer: Medicare Other

## 2017-06-17 LAB — GLUCOSE, CAPILLARY
GLUCOSE-CAPILLARY: 108 mg/dL — AB (ref 65–99)
Glucose-Capillary: 132 mg/dL — ABNORMAL HIGH (ref 65–99)
Glucose-Capillary: 130 mg/dL — ABNORMAL HIGH (ref 65–99)
Glucose-Capillary: 170 mg/dL — ABNORMAL HIGH (ref 65–99)
Glucose-Capillary: 156 mg/dL — ABNORMAL HIGH (ref 65–99)

## 2017-06-17 NOTE — NC FL2 (Signed)
[place] MEDICAID FL2 LEVEL OF CARE SCREENING TOOL     IDENTIFICATION  Patient Name: Sandra [name] Birthdate: 09-Dec-1940 Sex: female Admission Date (Current Location): 06/15/2017  [facility] and [place] Number:  [employer] and Address:  [place] [address] [place]      Provider Number: [id number]  Attending Physician Name and Address:  Murlean Iba, MD  Relative Name and Phone Number:       Current Level of Care: Hospital Recommended Level of Care: [place] Prior Approval Number:    Date Approved/Denied:   PASRR Number: [id number])  Discharge Plan: SNF    Current Diagnoses: Patient Active Problem List   Diagnosis Date Noted  . TIA (transient ischemic attack) 06/15/2017  . Hyperlipidemia associated with type 2 diabetes mellitus ([place]) 06/14/2017  . Type 2 diabetes mellitus with neurological complications ([place]) 61/95/0932  . Stroke ([place]) 03/31/2017  . History of adenomatous polyp of colon 02/11/2017  . Dysphagia 11/19/2016  . Essential hypertension 08/12/2016  . Hemorrhoids 06/16/2016  . Rectal bleeding 06/16/2016  . Constipation 06/16/2016    Orientation RESPIRATION BLADDER Height & Weight     Self  Normal Continent Weight: 197 lb 12 oz (89.7 kg) Height:  5\' 5"  (165.1 cm)  BEHAVIORAL SYMPTOMS/MOOD NEUROLOGICAL BOWEL NUTRITION STATUS      Continent Diet(heart healthy/carb modified)  AMBULATORY STATUS COMMUNICATION OF NEEDS Skin   Extensive Assist Verbally(global aphasia limiting communication) Normal                       Personal Care Assistance Level of Assistance  Bathing, Feeding, Dressing Bathing Assistance: Maximum assistance Feeding assistance: Limited assistance Dressing Assistance: Maximum assistance     Functional Limitations Info  Sight, Hearing, Speech Sight Info: Adequate Hearing Info: Adequate Speech Info: Impaired(global aphasia limiting communication)     SPECIAL CARE FACTORS FREQUENCY  PT (By licensed PT), OT (By licensed OT), Speech therapy     PT Frequency: 5x/week OT Frequency: 3x/week     Speech Therapy Frequency: 3x/week      Contractures Contractures Info: Not present    Additional Factors Info  Code Status, Allergies, Psychotropic Code Status Info: Full Code Allergies Info: Hctz, Morphine and Related, Valium Psychotropic Info: Celexa         Current Medications (06/17/2017):  This is the current hospital active medication list Current Facility-Administered Medications  Medication Dose Route Frequency Provider Last Rate Last Dose  . acetaminophen (TYLENOL) tablet 650 mg  650 mg Oral Q4H PRN Jani Gravel, MD       Or  . acetaminophen (TYLENOL) solution 650 mg  650 mg Per Tube Q4H PRN Jani Gravel, MD       Or  . acetaminophen (TYLENOL) suppository 650 mg  650 mg Rectal Q4H PRN Jani Gravel, MD      . aspirin suppository 300 mg  300 mg Rectal Daily Jani Gravel, MD       Or  . aspirin tablet 325 mg  325 mg Oral Daily Jani Gravel, MD   325 mg at 06/17/17 0947  . atorvastatin (LIPITOR) tablet 40 mg  40 mg Oral q1800 Johnson, Clanford L, MD   40 mg at 06/16/17 1723  . citalopram (CELEXA) tablet 10 mg  10 mg Oral Daily Jani Gravel, MD   10 mg at 06/17/17 0947  . clopidogrel (PLAVIX) tablet 75 mg  75 mg Oral Q breakfast Phillips Odor, MD   75 mg at  06/17/17 1103  . insulin aspart (novoLOG) injection 0-9 Units  0-9 Units Subcutaneous TID WC Johnson, Clanford L, MD   1 Units at 06/17/17 0739  . vitamin B-12 (CYANOCOBALAMIN) tablet 1,000 mcg  1,000 mcg Oral Daily Jani Gravel, MD   1,000 mcg at 06/17/17 1594     Discharge Medications: Please see discharge summary for a list of discharge medications.  Relevant Imaging Results:  Relevant Lab Results:   Additional Information SSN [id number] Clydene Pugh, [place]

## 2017-06-17 NOTE — Progress Notes (Signed)
PROGRESS NOTE   Sandra [name]  TIR:[id number]  DOB: 09-03-1940  DOA: 06/15/2017 PCP: Caren Macadam, MD  Brief Admission Hx: 77 y.o. female, w hypertension, hyperlipidemia, Dm2, CVA,  apparently not taking bp medication for the past 2 weeks presents with dysarthria.  Pt is not taking aspirin at home. Per teleneurology last known well 1200 on 06/14/2017.  Pt has difficulty with comprehension as well it appears.  The patient was noted to have an acute CVA by MRI study.  MDM/Assessment & Plan:   1. Moderate size left MCA stroke- neurologic consultation recommending dual antiplatelet therapy x 3 months, atorvastatin 40 mg daily, hemoglobin A1c pending, carotid ultrasound studies no significant stenosis, echocardiogram below, PT/OT recommending CIR/SNF.  SLP following.  Pt having acute change in functional status, I spoke with neurologist Dr. Merlene Laughter and he recommended getting CT head to evaluate for hemorrhagic transformation. 2. Dyslipidemia-uncontrolled as evidenced by an LDL cholesterol of 150.  I have started the patient on atorvastatin 40 mg daily. 3. Uncontrolled hypertension- given the acute CVA will allow for permissive hypertension and therefore holding home blood pressure medication at this time. 4. Aphasia-this is a new finding for the patient and I have consulted the speech therapist to evaluate. 5. Type 2 diabetes mellitus-carbohydrate modified diet, sliding scale coverage as needed.  A1c pending.  DVT prophylaxis: SCDs Code Status: Full Family Communication: No one present during rounds Disposition Plan: CIR vs SNF  Consultants:  Neurology  Procedures:  MRI/MRA, carotid Dopplers and echocardiogram pending  Subjective: The patient remains aphasic however she does blink and tries to respond to questions and follow simple commands.  Objective: Vitals:   06/17/17 0030 06/17/17 0430 06/17/17 0830 06/17/17 0906  BP: (!) 158/61 (!) 167/85 (!) 174/65   Pulse: 63 62 66     Resp: 18 18 16    Temp: 100 F (37.8 C) 99.1 F (37.3 C) 99.4 F (37.4 C)   TempSrc: Oral Axillary Axillary   SpO2: 95% 93% 96% 97%  Weight:      Height:        Intake/Output Summary (Last 24 hours) at 06/17/2017 1218 Last data filed at 06/16/2017 1705 Gross per 24 hour  Intake 480 ml  Output 1010 ml  Net -530 ml   Filed Weights   06/15/17 1417 06/15/17 1942  Weight: 89.8 kg (198 lb) 89.7 kg (197 lb 12 oz)     REVIEW OF SYSTEMS  As per history otherwise all reviewed and reported negative  Exam:  General exam: Lying in the bed, awake, alert, no apparent distress, following simple commands.  Remains aphasic. Respiratory system: Clear. No increased work of breathing. Cardiovascular system: S1 & S2 heard. No JVD, murmurs, gallops, clicks or pedal edema. Gastrointestinal system: Abdomen is nondistended, soft and nontender. Normal bowel sounds heard. Central nervous system: Alert.  Pronounced right hemiparesis involving the right upper extremity, able to move the right lower extremity.  Normal gaze tracking.  Speech, language and comprehension deficits noted. Extremities: no CCE.  Data Reviewed: Basic Metabolic Panel: Recent Labs  Lab 06/15/17 1430 06/15/17 1441 06/16/17 0632  NA 141 141 140  K 3.7 3.5 3.7  CL 100* 100* 101  CO2 30  --  31  GLUCOSE 144* 140* 127*  BUN 17 17 13   CREATININE 0.78 0.80 0.57  CALCIUM 9.1  --  8.8*   Liver Function Tests: Recent Labs  Lab 06/15/17 1430 06/16/17 0632  AST 22 20  ALT 17 14  ALKPHOS 85 71  BILITOT  0.7 0.8  PROT 7.2 6.4*  ALBUMIN 3.6 3.1*   No results for input(s): LIPASE, AMYLASE in the last 168 hours. No results for input(s): AMMONIA in the last 168 hours. CBC: Recent Labs  Lab 06/15/17 1430 06/15/17 1441  WBC 10.1  --   NEUTROABS 6.1  --   HGB 14.3 15.6*  HCT 44.3 46.0  MCV 87.2  --   PLT 264  --    Cardiac Enzymes: No results for input(s): CKTOTAL, CKMB, CKMBINDEX, TROPONINI in the last 168  hours. CBG (last 3)  Recent Labs    06/17/17 0512 06/17/17 0725 06/17/17 1114  GLUCAP 132* 130* 108*   No results found for this or any previous visit (from the past 240 hour(s)).   Studies: Ct Angio Head W Or Wo Contrast  Result Date: 06/16/2017 CLINICAL DATA:  Subacute LEFT infarct, dysarthria for 2 weeks. History of hypertension, diabetes. EXAM: CT ANGIOGRAPHY HEAD TECHNIQUE: Multidetector CT imaging of the head was performed using the standard protocol during bolus administration of intravenous contrast. Multiplanar CT image reconstructions and MIPs were obtained to evaluate the vascular anatomy. CONTRAST:  42mL ISOVUE-370 IOPAMIDOL (ISOVUE-370) INJECTION 76% COMPARISON:  MRI/MRA head June 16, 2017. Carotid ultrasound June 16, 2017 FINDINGS: CT HEAD BRAIN: LEFT frontal parietal loss of gray-white matter differentiation with mild edema in regional mass effect. Patchy to confluent supratentorial white matter hypodensities exclusive of aforementioned abnormality. No midline shift, mass effect. Old suspected bilateral basal ganglia and LEFT thalamus lacunar infarcts. No abnormal extra-axial fluid collections. Basal cisterns are patent. VASCULAR: Moderate calcific atherosclerosis of the carotid siphons. SKULL: No skull fracture. No significant scalp soft tissue swelling. SINUSES/ORBITS: The mastoid air-cells and included paranasal sinuses are well-aerated.The included ocular globes and orbital contents are non-suspicious. Status post bilateral ocular lens implants. OTHER: None. CTA HEAD FINDINGS: ANTERIOR CIRCULATION: Patent cervical internal carotid arteries, petrous, cavernous and supra clinoid internal carotid arteries. Calcified plaque resulting in moderate stenosis carotid siphons. Patent anterior communicating artery. Occluded proximal LEFT M2 segment, superior division. Otherwise patent anterior and middle cerebral arteries. Scattered severe stenoses bilateral anterior and middle cerebral  arteries. No large vessel occlusion, significant stenosis, contrast extravasation or aneurysm. POSTERIOR CIRCULATION: Patent vertebral arteries, vertebrobasilar junction and basilar artery, as well as main branch vessels. Patent posterior cerebral arteries. Severe tandem stenosis bilateral posterior cerebral arteries. No large vessel occlusion, significant stenosis, contrast extravasation or aneurysm. VENOUS SINUSES: Major dural venous sinuses are patent though not tailored for evaluation on this angiographic examination. ANATOMIC VARIANTS: None. DELAYED PHASE: No abnormal intracranial enhancement. MIP images reviewed. IMPRESSION: CT HEAD: 1. Acute to subacute appearing LEFT frontoparietal/MCA territory nonhemorrhagic infarct. 2. Moderate to severe chronic small vessel ischemic disease, old lacunar infarcts. CTA HEAD: 1. LEFT M2 occlusion, considering above findings this is likely acute to subacute. 2. Advanced intracranial atherosclerosis resulting in scattered severe stenoses anterior and posterior circulation. These results will be called to the ordering clinician or representative by the Radiologist Assistant, and communication documented in the PACS or zVision Dashboard. Electronically Signed   By: Elon Alas M.D.   On: 06/16/2017 18:42   Ct Head Wo Contrast  Result Date: 06/15/2017 CLINICAL DATA:  Difficulty speaking since yesterday EXAM: CT HEAD WITHOUT CONTRAST TECHNIQUE: Contiguous axial images were obtained from the base of the skull through the vertex without intravenous contrast. COMPARISON:  11/29/2016 FINDINGS: Brain: There is no evidence for acute hemorrhage, hydrocephalus, mass lesion, or abnormal extra-axial fluid collection. No definite CT evidence for acute infarction. Diffuse loss  of parenchymal volume is consistent with atrophy. Patchy low attenuation in the deep hemispheric and periventricular white matter is nonspecific, but likely reflects chronic microvascular ischemic  demyelination. Vascular: No hyperdense vessel or unexpected calcification. Skull: No evidence for fracture. No worrisome lytic or sclerotic lesion. Sinuses/Orbits: The visualized paranasal sinuses and mastoid air cells are clear. Visualized portions of the globes and intraorbital fat are unremarkable. Other: None. IMPRESSION: Stable.  No acute intracranial abnormality Atrophy with chronic small vessel white matter ischemic disease. Electronically Signed   By: Misty Stanley M.D.   On: 06/15/2017 15:03   Mr Brain Wo Contrast  Result Date: 06/16/2017 CLINICAL DATA:  Difficulty speaking. Unable to write with the right hand. EXAM: MRI HEAD WITHOUT CONTRAST MRA HEAD WITHOUT CONTRAST TECHNIQUE: Multiplanar, multiecho pulse sequences of the brain and surrounding structures were obtained without intravenous contrast. Angiographic images of the head were obtained using MRA technique without contrast. COMPARISON:  Head CT 06/15/2017 and MRI 11/29/2016 FINDINGS: MRI HEAD FINDINGS The patient could not tolerate the complete examination. Sagittal T1, axial and coronal diffusion, and axial T2 sequences were obtained and are motion degraded, severely so on the sagittal T1 sequence. Brain: There is a moderate-sized acute MCA infarct involving the posterior left frontal lobe and perirolandic region. No associated hemorrhage is identified on this limited study. No mass, midline shift, or extra-axial fluid collection is seen. There is mild generalized cerebral atrophy. Chronic lacunar infarcts are present in the deep gray nuclei and deep cerebral white matter bilaterally as well as in the right cerebellum. Patchy to confluent T2 hyperintensities elsewhere in the cerebral white matter bilaterally are similar to the prior MRI and compatible with moderately advanced chronic small vessel ischemic disease. Vascular: Major intracranial vascular flow voids are preserved. Skull and upper cervical spine: No suspicious marrow lesion  identified. Sinuses/Orbits: Bilateral cataract extraction. Minimal mucosal thickening in the paranasal sinuses. Clear mastoid air cells. Other: None. MRA HEAD FINDINGS There is intermittent severe motion artifact. The visualized distal vertebral arteries are patent and codominant. The vertebrobasilar junction and proximal half of the basilar artery are completely obscured by motion artifact. The distal basilar artery is widely patent. Posterior communicating arteries are diminutive or absent. The P1 segments are patent, however artifact results in nondiagnostic evaluation of the more distal PCAs. The horizontal petrous segments of both ICAs are completely obscured by motion artifact. The intracranial ICAs are patent proximal and distal to this without evidence of high-grade stenosis. The proximal left M1 segment is patent, however motion artifact obscures the distal M1 segment and MCA bifurcation and patency at this level cannot be established. There are multiple missing left MCA branch vessels, particularly in the superior division. The right MCA and both ACAs are grossly patent. No large aneurysm is identified. IMPRESSION: 1. Severely motion degraded, incomplete examination. 2. Moderate-sized left MCA infarct primarily involving the posterolateral frontal lobe. 3. Moderately advanced chronic small vessel ischemic disease. 4. Limited MRA due to motion. Patent proximal left M1 segment with obscured distal M1 segment and bifurcation. Multiple missing left MCA branch vessels, particularly in the superior division. Electronically Signed   By: Logan Bores M.D.   On: 06/16/2017 08:21   US Carotid Bilateral (at [facility] And [facility] Only)  Result Date: 06/16/2017 CLINICAL DATA:  Acute left MCA distribution cerebral infarction. History of hypertension and hyperlipidemia. EXAM: BILATERAL CAROTID DUPLEX ULTRASOUND TECHNIQUE: Pearline Cables scale imaging, color Doppler and duplex ultrasound were performed of bilateral carotid and vertebral  arteries in the neck. COMPARISON:  None. FINDINGS: Criteria: Quantification of carotid stenosis is based on velocity parameters that correlate the residual internal carotid diameter with NASCET-based stenosis levels, using the diameter of the distal internal carotid lumen as the denominator for stenosis measurement. The following velocity measurements were obtained: RIGHT ICA:  52/18 cm/sec CCA:  40/98 cm/sec SYSTOLIC ICA/CCA RATIO:  0.8 DIASTOLIC ICA/CCA RATIO:  1.5 ECA:  72 cm/sec LEFT ICA:  60/16 cm/sec CCA:  11/91 cm/sec SYSTOLIC ICA/CCA RATIO:  0.9 DIASTOLIC ICA/CCA RATIO:  1.5 ECA:  131 cm/sec RIGHT CAROTID ARTERY: Minimal partially calcified plaque at the level of the distal bulb and proximal ICA. Velocities and waveforms are normal and estimated right ICA stenosis is less than 50%. The right internal carotid artery is mildly tortuous. RIGHT VERTEBRAL ARTERY: Antegrade flow with normal waveform and velocity. LEFT CAROTID ARTERY: There is a mild amount of eccentric calcified plaque at the level of the carotid bulb and proximal left ICA. Velocities and waveforms are unremarkable and estimated left ICA stenosis is less than 50%. The left ICA is moderately tortuous. LEFT VERTEBRAL ARTERY: Antegrade flow with normal waveform and velocity. IMPRESSION: No significant carotid stenosis identified in the neck bilaterally. Estimated bilateral ICA stenoses are less than 50%. Minimal to mild plaque present, as above. Electronically Signed   By: Aletta Edouard M.D.   On: 06/16/2017 08:54   Mr Jodene Nam Head/brain YN Cm  Result Date: 06/16/2017 CLINICAL DATA:  Difficulty speaking. Unable to write with the right hand. EXAM: MRI HEAD WITHOUT CONTRAST MRA HEAD WITHOUT CONTRAST TECHNIQUE: Multiplanar, multiecho pulse sequences of the brain and surrounding structures were obtained without intravenous contrast. Angiographic images of the head were obtained using MRA technique without contrast. COMPARISON:  Head CT 06/15/2017 and  MRI 11/29/2016 FINDINGS: MRI HEAD FINDINGS The patient could not tolerate the complete examination. Sagittal T1, axial and coronal diffusion, and axial T2 sequences were obtained and are motion degraded, severely so on the sagittal T1 sequence. Brain: There is a moderate-sized acute MCA infarct involving the posterior left frontal lobe and perirolandic region. No associated hemorrhage is identified on this limited study. No mass, midline shift, or extra-axial fluid collection is seen. There is mild generalized cerebral atrophy. Chronic lacunar infarcts are present in the deep gray nuclei and deep cerebral white matter bilaterally as well as in the right cerebellum. Patchy to confluent T2 hyperintensities elsewhere in the cerebral white matter bilaterally are similar to the prior MRI and compatible with moderately advanced chronic small vessel ischemic disease. Vascular: Major intracranial vascular flow voids are preserved. Skull and upper cervical spine: No suspicious marrow lesion identified. Sinuses/Orbits: Bilateral cataract extraction. Minimal mucosal thickening in the paranasal sinuses. Clear mastoid air cells. Other: None. MRA HEAD FINDINGS There is intermittent severe motion artifact. The visualized distal vertebral arteries are patent and codominant. The vertebrobasilar junction and proximal half of the basilar artery are completely obscured by motion artifact. The distal basilar artery is widely patent. Posterior communicating arteries are diminutive or absent. The P1 segments are patent, however artifact results in nondiagnostic evaluation of the more distal PCAs. The horizontal petrous segments of both ICAs are completely obscured by motion artifact. The intracranial ICAs are patent proximal and distal to this without evidence of high-grade stenosis. The proximal left M1 segment is patent, however motion artifact obscures the distal M1 segment and MCA bifurcation and patency at this level cannot be  established. There are multiple missing left MCA branch vessels, particularly in the superior division. The right MCA and both  ACAs are grossly patent. No large aneurysm is identified. IMPRESSION: 1. Severely motion degraded, incomplete examination. 2. Moderate-sized left MCA infarct primarily involving the posterolateral frontal lobe. 3. Moderately advanced chronic small vessel ischemic disease. 4. Limited MRA due to motion. Patent proximal left M1 segment with obscured distal M1 segment and bifurcation. Multiple missing left MCA branch vessels, particularly in the superior division. Electronically Signed   By: Logan Bores M.D.   On: 06/16/2017 08:21   Scheduled Meds: . aspirin  300 mg Rectal Daily   Or  . aspirin  325 mg Oral Daily  . atorvastatin  40 mg Oral q1800  . citalopram  10 mg Oral Daily  . clopidogrel  75 mg Oral Q breakfast  . insulin aspart  0-9 Units Subcutaneous TID WC  . vitamin B-12  1,000 mcg Oral Daily   Continuous Infusions:  Principal Problem:   Stroke [facility]) Active Problems:   Essential hypertension   Type 2 diabetes mellitus with neurological complications ([place])   Hyperlipidemia associated with type 2 diabetes mellitus ([place])  Time spent:   Irwin Brakeman, MD, FAAFP [facility] Pager [phone]  If 7PM-7AM, please contact night-coverage [email or web address] Password [email or web address] 06/17/2017, 12:18 PM    LOS: 2 days

## 2017-06-17 NOTE — Progress Notes (Signed)
Physical Therapy Treatment Patient Details Name: Sandra [name] MRN: [id number] DOB: 01-30-1941 Today's Date: 06/17/2017    History of Present Illness Sandra [name]  is a 77 y.o. female, w hypertension, hyperlipidemia, Dm2, CVA,  apparently not taking bp medication for the past 2 weeks presents with dysarthria.  Pt is not taking aspirin at home. Per teleneurology last known well 1200 on 06/14/2017.  Pt has difficulty with comprehension as well it appears.      PT Comments    Pt demonstrating acute decline in functional mobility this session, now requiring mod-max for bed mobility and transfers: these were performed with greater independence 1DA at eval.  Pt also unable to AMB today d/t +2 and left hand flaccidity. Pt is visually responsive to verbal stimuli, participates best with tactile and visual cues, but "not uh" and "uh huh" responses are noted to be inconsistent with cuing, suspected to be more related to expressive deficits than perceptive ones. The patient attempts to follow simple commands on unaffected side, but at times demonstrates a combination of mild to moderate gross coordination deficits and apraxia. Level of assistance required at this time is significantly higher than 1DA, PT DC recommendations updated to CIR, family is agreeable. Pt is participatory and motivated and would tolerate well high frequency high volume rehabilitation.    Follow Up Recommendations  CIR(If CIR is unavailable, Family would like STR)     Equipment Recommendations  (to be determined by facility)    Recommendations for Other Services Rehab consult     Precautions / Restrictions Precautions Precautions: Fall Precaution Comments: truncal ataxia, gross coordination deficits Restrictions Weight Bearing Restrictions: No    Mobility  Bed Mobility Overal bed mobility: Needs Assistance Bed Mobility: Supine to Sit     Supine to sit: Mod assist Sit to supine: Min assist   General bed mobility  comments: responds to tactile/visual cues in legs, uses LUE and hand held assist o pull self up, still requires heavy trunk assist.   Transfers Overall transfer level: Needs assistance Equipment used: None(Pt no longer able to use RUE as she was yesterday. ) Transfers: Stand Pivot Transfers;Sit to/from Stand Sit to Stand: Max assist Stand pivot transfers: Total assist;+2 physical assistance       General transfer comment: participated, but RLE gross coordination is limited; modifies attempts appropriately whentransitioning from STS to SPT   Ambulation/Gait Ambulation/Gait assistance: (not appropriate at this time. )               Stairs            Wheelchair Mobility    Modified Rankin (Stroke Patients Only)       Balance     Sitting balance-Leahy Scale: Zero Sitting balance - Comments: can maintain seated balance for up to 20 second bouts      Standing balance-Leahy Scale: Zero                              Cognition Arousal/Alertness: Awake/alert Behavior During Therapy: WFL for tasks assessed/performed Overall Cognitive Status: Impaired/Different from baseline Area of Impairment: Following commands                       Following Commands: Follows one step commands inconsistently       General Comments: interactive visually, inconcsistent use of basic vocalizations for yes/no, inititates and ceases activitry as directed, but difficulty with motor processing specificity  Exercises General Exercises - Lower Extremity Long Arc Quad: AROM;15 reps;Seated Other Exercises Other Exercises: Seated Trunk/LUE forward reaching x10; LUE Left trunk lean/reach x10.     General Comments        Pertinent Vitals/Pain Pain Assessment: Faces Faces Pain Scale: No hurt    Home Living                      Prior Function            PT Goals (current goals can now be found in the care plan section) Acute Rehab PT  Goals Patient Stated Goal: none verbalized by patient    Frequency    7X/week      PT Plan Discharge plan needs to be updated    Co-evaluation              AM-PAC PT "6 Clicks" Daily Activity  Outcome Measure  Difficulty turning over in bed (including adjusting bedclothes, sheets and blankets)?: Unable Difficulty moving from lying on back to sitting on the side of the bed? : Unable Difficulty sitting down on and standing up from a chair with arms (e.g., wheelchair, bedside commode, etc,.)?: Unable Help needed moving to and from a bed to chair (including a wheelchair)?: Total Help needed walking in hospital room?: Total Help needed climbing 3-5 steps with a railing? : Total 6 Click Score: 6    End of Session Equipment Utilized During Treatment: Gait belt Activity Tolerance: Patient tolerated treatment well;Patient limited by fatigue Patient left: in chair;with call bell/phone within reach;with chair alarm set Nurse Communication: Mobility status PT Visit Diagnosis: Unsteadiness on feet (R26.81);Other abnormalities of gait and mobility (R26.89);Muscle weakness (generalized) (M62.81);Difficulty in walking, not elsewhere classified (R26.2)     Time: 7591-6384 PT Time Calculation (min) (ACUTE ONLY): 21 min  Charges:  $Neuromuscular Re-education: 8-22 mins                    G Codes:       11:48 AM, 06-18-2017 Etta Grandchild, PT, DPT Physical Therapist - [place] [phone] (Office)     Buccola,Allan C 18-Jun-2017, 11:42 AM

## 2017-06-17 NOTE — Progress Notes (Signed)
Occupational Therapy Treatment Patient Details Name: Sandra [name] MRN: [id number] DOB: [birth year] Today's Date: 06/17/2017    History of present illness Sandra [name]  is a 77 y.o. female, w hypertension, hyperlipidemia, Dm2, CVA,  apparently not taking bp medication for the past 2 weeks presents with dysarthria.  Pt is not taking aspirin at home. Per teleneurology last known well 1200 on 06/14/2017.  Pt has difficulty with comprehension as well it appears.     OT comments  Pt seen for OT treatment this am. Pt with significant difference in functional status compared to evaluation yesterday. This am pt is unable to utilize RUE for any functional task completion, strength is 1/5 throughout. Suspect sensation deficits as pt noted to be sitting at EOB leaning to right with weight on back of wrist, unaware of position or of OT assisting with placing hand palm down. OT provided washcloth for grooming, pt attempting to use RUE however was unable to lift RUE off of bed, used LUE for washing face. Pt continues to have global aphasia limiting communication, was able to follow visual gestures and use items appropriately with left non-dominant hand. Nursing reports pt attempting to eat with left hand this am, nsg fed pt last night as pt would not eat otherwise. OT attempted to have pt grasp walker, pt unable to grasp or maintain hold when OT placed on walker with RUE. Pt was able to perform bed mobility tasks with assistance, unable to perform transfers with OT.   Updated discharge recommendation is for CIR to improve safety and independence in ADL completion and mobility tasks, as well as improve strength and coordination required for functional task completion.    Follow Up Recommendations  CIR    Equipment Recommendations  None recommended by OT    Recommendations for Other Services Rehab consult    Precautions / Restrictions Precautions Precautions: Fall Precaution Comments: truncal ataxia,  gross coordination deficits Restrictions Weight Bearing Restrictions: No       Mobility Bed Mobility Overal bed mobility: Needs Assistance Bed Mobility: Supine to Sit;Sit to Supine     Supine to sit: Mod assist Sit to supine: Min assist   General bed mobility comments: Pt was able to use BLE to scoot herself up in the bed  Transfers                 General transfer comment: Unable to perform sit to stand or stand-pivot transfer this am due to right ataxia and difficulty with comprehension        ADL either performed or assessed with clinical judgement   ADL Overall ADL's : Needs assistance/impaired     Grooming: Wash/dry hands;Wash/dry face;Set up;Bed level Grooming Details (indicate cue type and reason): pt able to wash face using left hand while in bed, unable to lift right arm to grasp washcloth Upper Body Bathing: Moderate assistance;Sitting Upper Body Bathing Details (indicate cue type and reason): Pt unable to use RUE for bathing, assist for bathing left side of body, back, and LB Lower Body Bathing: Moderate assistance;Bed level Lower Body Bathing Details (indicate cue type and reason): Pt unable to use RUE for balance or bathing tasks, assist for bending and reaching lower legs/feet Upper Body Dressing : Moderate assistance;Sitting Upper Body Dressing Details (indicate cue type and reason): Assist for threading right arm through gown. Total assist for buttons/ties Lower Body Dressing: Minimal assistance;Sitting/lateral leans Lower Body Dressing Details (indicate cue type and reason): Pt able to use LUE for donning/fixing socks, unable  to use right hand to assist   Toilet Transfer Details (indicate cue type and reason): unable to complete          Functional mobility during ADLs: (unable to complete) General ADL Comments: Pt requiring significantly increased level of assistance compared to evaluation yesterday. Pt is unable to use RUE for any ADL tasks due  to weakness and poor coordination     Vision   Vision Assessment?: No apparent visual deficits Additional Comments: Pt is able to track OT in all quadrants, aware of OT entering room from right side.           Cognition Arousal/Alertness: Awake/alert Behavior During Therapy: WFL for tasks assessed/performed Overall Cognitive Status: Impaired/Different from baseline Area of Impairment: Following commands                       Following Commands: Follows one step commands inconsistently       General Comments: interactive visually, inconcsistent use of basic vocalizations for yes/no, inititates and ceases activitry as directed, but difficulty with motor processing specificity                   Pertinent Vitals/ Pain       Pain Assessment: Faces Faces Pain Scale: No hurt     Prior Functioning/Environment              Frequency  Min 2X/week        Progress Toward Goals  OT Goals(current goals can now be found in the care plan section)  Progress towards OT goals: Not progressing toward goals - comment(pt CVA has evolved or has potentially had additional CVA)  Acute Rehab OT Goals Patient Stated Goal: none verbalized by patient OT Goal Formulation: With patient Time For Goal Achievement: 06/30/17 Potential to Achieve Goals: Good ADL Goals Pt Will Perform Grooming: with supervision;standing Pt Will Transfer to Toilet: with min guard assist;ambulating;regular height toilet;bedside commode Pt Will Perform Toileting - Clothing Manipulation and hygiene: with min guard assist;sitting/lateral leans;sit to/from stand Pt/caregiver will Perform Home Exercise Program: Increased strength;Right Upper extremity;With minimal assist;With written HEP provided  Plan Discharge plan needs to be updated          End of Session Equipment Utilized During Treatment: Gait belt  OT Visit Diagnosis: Hemiplegia and hemiparesis;Muscle weakness (generalized)  (M62.81) Hemiplegia - Right/Left: Right Hemiplegia - dominant/non-dominant: Dominant Hemiplegia - caused by: Cerebral infarction   Activity Tolerance Patient tolerated treatment well   Patient Left in bed;with call bell/phone within reach;with bed alarm set   Nurse Communication Mobility status        Time: 0950-1010 OT Time Calculation (min): 20 min  Charges: OT General Charges $OT Visit: 1 Visit OT Treatments $Self Care/Home Management : 8-22 mins   Guadelupe Sabin, OTR/L  [phone] 06/17/2017, 11:31 AM

## 2017-06-17 NOTE — Progress Notes (Signed)
[place] A. Merlene Laughter, MD     [email or web address]          Sandra [name] is an 77 y.o. female.   ASSESSMENT/PLAN: 1.  Worsening neurological symptom overnights with gait impairment and the right-sided hemiparesis. The head CT finding does not support diagnosis of extension of her stroke. It essentially shows increased edema. However, the extent is not malignant and she should do well without further deterioration in neurological function. Continue with the current care of neuro checks, physical therapy, occupational therapy and speech.   2. 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  3. Multivessel intracranial disease:  The patient has had a downturn. Repeat imaging was suggested and is reviewed below.     GENERAL: This is a pleasant obese female who is sitting up in a chair. She is in no acute distress.  HEENT: normal  ABDOMEN: soft  EXTREMITIES: No edema   BACK: normal  SKIN: Normal by inspection.    MENTAL STATUS: She is dosing on entering opens her eyes to verbal commands -1. She however does not follow other commands -2. 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   CRANIAL NERVES: Pupils are equal, round and reactive to light and accomodation; extra ocular movements are full, there is no significant nystagmus; visual fields reveals a dense right homonymous hemianopia -2; upper and lower facial muscles are normal in strength and symmetric, there is no flattening of the nasolabial folds.  MOTOR: She seemed to have at least close to normal strength on the left side with no drift in the left upper lower extremity. Right upper extremity is a profoundly weak graded as 2/5. She has a profound drift there. There is a mild drift involving the right leg and strength is about 4.  COORDINATION: no dysmetria appreciated. No tremors.   SENSATION: There is  reduced response to pain on the right -2. No clear extinction appreciated.       Blood pressure (!) 161/92, pulse 66, temperature 98.4 F (36.9 C), temperature source Axillary, resp. rate 16, height 5' 5"  (1.651 m), weight 197 lb 12 oz (89.7 kg), SpO2 93 %.  Past Medical History:  Diagnosis Date  . Colon polyp   . Constipation   . Diabetes mellitus without complication ([place])   . GI bleed   . Hemorrhoids   . High cholesterol   . Hypertension   . PAD (peripheral artery disease) ([place])   . Peripheral neuropathy   . Rectal bleeding    "for 7 years"  . Rectal bleeding   . Stroke [facility])     Past Surgical History:  Procedure Laterality Date  . ABDOMINAL HYSTERECTOMY    . COLONOSCOPY     X 7  . COLONOSCOPY N/A 03/21/2017   Procedure: COLONOSCOPY;  Surgeon: Danie Binder, MD;  Location: [facility];  Service: Endoscopy;  Laterality: N/A;  1215     Family History  Problem Relation Age of Onset  . Diabetes Mother   . Cancer Father   . Diabetes Sister   . Diabetes Brother   . Diabetes Daughter   . Diabetes Maternal Grandmother   . Diabetes Daughter   . Colon cancer Neg Hx     Social History:  reports that  has never smoked. she has never used smokeless tobacco. She reports that she does not drink alcohol or use drugs.  Allergies:  Allergies  Allergen  Reactions  . Hctz [Hydrochlorothiazide] Other (See Comments)    "gave me pancreatitis"  . Morphine And Related     "made room spin"  . Valium [Diazepam] Other (See Comments)    Confusion, blurred vision    Medications: Prior to Admission medications   Medication Sig Start Date End Date Taking? Authorizing Provider  amLODipine (NORVASC) 10 MG tablet Take 10 mg by mouth daily.   Yes [provider]  aspirin EC 81 MG tablet Take 81 mg by mouth daily.   Yes [provider]  citalopram (CELEXA) 10 MG tablet Take 10 mg by mouth daily.   Yes [provider]  ergocalciferol (DRISDOL) 8000  UNIT/ML drops Take by mouth daily. Takes 2 drops daily   Yes [provider]  LANTUS 100 UNIT/ML injection Inject 0.38 mLs (38 Units total) at bedtime into the skin. 03/21/17  Yes Hagler, Apolonio Schneiders, MD  losartan (COZAAR) 100 MG tablet Take 100 mg by mouth daily.   Yes [provider]  metFORMIN (GLUCOPHAGE) 500 MG tablet Take 500 mg by mouth 2 (two) times daily with a meal.   Yes [provider]  metoprolol succinate (TOPROL-XL) 100 MG 24 hr tablet Take 1 tablet (100 mg total) by mouth daily. Take with or immediately following a meal. 05/30/17  Yes Hagler, Apolonio Schneiders, MD  Multiple Vitamins-Minerals (ONE-A-DAY 50 PLUS PO) Take 1 tablet by mouth daily.   Yes [provider]  olmesartan (BENICAR) 40 MG tablet Take 1 tablet (40 mg total) by mouth daily. 06/14/17  Yes Hagler, Apolonio Schneiders, MD  Omega-3 Fatty Acids (FISH OIL) 1200 MG CAPS Take 1 capsule by mouth daily.   Yes [provider]  polyethylene glycol powder (GLYCOLAX/MIRALAX) powder Take 17 g by mouth as needed for mild constipation or moderate constipation.    Yes [provider]  simvastatin (ZOCOR) 20 MG tablet Take 20 mg by mouth daily at 6 PM.   Yes [provider]  vitamin B-12 (CYANOCOBALAMIN) 1000 MCG tablet Take 1,000 mcg by mouth daily.   Yes [provider]  hydrocortisone (ANUSOL-HC) 2.5 % rectal cream Apply rectally 2 times daily Patient not taking: Reported on 05/24/2017 11/04/16   [name], MD    Scheduled Meds: . aspirin  300 mg Rectal Daily   Or  . aspirin  325 mg Oral Daily  . atorvastatin  40 mg Oral q1800  . citalopram  10 mg Oral Daily  . clopidogrel  75 mg Oral Q breakfast  . insulin aspart  0-9 Units Subcutaneous TID WC  . vitamin B-12  1,000 mcg Oral Daily   Continuous Infusions: PRN Meds:.acetaminophen **OR** acetaminophen (TYLENOL) oral liquid 160 mg/5 mL **OR** acetaminophen     Results for orders placed or performed during the hospital encounter of  06/15/17 (from the past 48 hour(s))  Glucose, capillary     Status: None   Collection Time: 06/15/17  9:26 PM  Result Value Ref Range   Glucose-Capillary 86 65 - 99 mg/dL   Comment 1 Notify RN    Comment 2 Document in Chart   Glucose, capillary     Status: None   Collection Time: 06/16/17 12:45 AM  Result Value Ref Range   Glucose-Capillary 93 65 - 99 mg/dL   Comment 1 Notify RN    Comment 2 Document in Chart   Glucose, capillary     Status: Abnormal   Collection Time: 06/16/17  4:15 AM  Result Value Ref Range   Glucose-Capillary 124 (H)  65 - 99 mg/dL   Comment 1 Notify RN    Comment 2 Document in Chart   Lipid panel     Status: Abnormal   Collection Time: 06/16/17  6:32 AM  Result Value Ref Range   Cholesterol 204 (H) 0 - 200 mg/dL   Triglycerides 93 <150 mg/dL   HDL 35 (L) >40 mg/dL   Total CHOL/HDL Ratio 5.8 RATIO   VLDL 19 0 - 40 mg/dL   LDL Cholesterol 150 (H) 0 - 99 mg/dL    Comment:        Total Cholesterol/HDL:CHD Risk Coronary Heart Disease Risk Table                     Men   Women  1/2 Average Risk   3.4   3.3  Average Risk       5.0   4.4  2 X Average Risk   9.6   7.1  3 X Average Risk  23.4   11.0        Use the calculated Patient Ratio above and the CHD Risk Table to determine the patient's CHD Risk.        ATP III CLASSIFICATION (LDL):  <100     mg/dL   Optimal  100-129  mg/dL   Near or Above                    Optimal  130-159  mg/dL   Borderline  160-189  mg/dL   High  >190     mg/dL   Very High   Comprehensive metabolic panel     Status: Abnormal   Collection Time: 06/16/17  6:32 AM  Result Value Ref Range   Sodium 140 135 - 145 mmol/L   Potassium 3.7 3.5 - 5.1 mmol/L   Chloride 101 101 - 111 mmol/L   CO2 31 22 - 32 mmol/L   Glucose, Bld 127 (H) 65 - 99 mg/dL   BUN 13 6 - 20 mg/dL   Creatinine, Ser 0.57 0.44 - 1.00 mg/dL   Calcium 8.8 (L) 8.9 - 10.3 mg/dL   Total Protein 6.4 (L) 6.5 - 8.1 g/dL   Albumin 3.1 (L) 3.5 - 5.0 g/dL   AST 20 15  - 41 U/L   ALT 14 14 - 54 U/L   Alkaline Phosphatase 71 38 - 126 U/L   Total Bilirubin 0.8 0.3 - 1.2 mg/dL   GFR calc non Af Amer >60 >60 mL/min   GFR calc Af Amer >60 >60 mL/min    Comment: (NOTE) The eGFR has been calculated using the CKD EPI equation. This calculation has not been validated in all clinical situations. eGFR's persistently <60 mL/min signify possible Chronic Kidney Disease.    Anion gap 8 5 - 15  Glucose, capillary     Status: Abnormal   Collection Time: 06/16/17  8:22 AM  Result Value Ref Range   Glucose-Capillary 119 (H) 65 - 99 mg/dL  Glucose, capillary     Status: Abnormal   Collection Time: 06/16/17 12:14 PM  Result Value Ref Range   Glucose-Capillary 163 (H) 65 - 99 mg/dL  Glucose, capillary     Status: Abnormal   Collection Time: 06/16/17  4:43 PM  Result Value Ref Range   Glucose-Capillary 102 (H) 65 - 99 mg/dL  Glucose, capillary     Status: Abnormal   Collection Time: 06/16/17 10:34 PM  Result Value Ref Range   Glucose-Capillary 128 (H)  65 - 99 mg/dL  Glucose, capillary     Status: Abnormal   Collection Time: 06/17/17  5:12 AM  Result Value Ref Range   Glucose-Capillary 132 (H) 65 - 99 mg/dL  Glucose, capillary     Status: Abnormal   Collection Time: 06/17/17  7:25 AM  Result Value Ref Range   Glucose-Capillary 130 (H) 65 - 99 mg/dL  Glucose, capillary     Status: Abnormal   Collection Time: 06/17/17 11:14 AM  Result Value Ref Range   Glucose-Capillary 108 (H) 65 - 99 mg/dL  Glucose, capillary     Status: Abnormal   Collection Time: 06/17/17  4:44 PM  Result Value Ref Range   Glucose-Capillary 170 (H) 65 - 99 mg/dL    Studies/Results:  CAROTID DOPPLERS IMPRESSION: No significant carotid stenosis identified in the neck bilaterally. Estimated bilateral ICA stenoses are less than 50%. Minimal to mild plaque present, as above.    BRAIN MRI MRA FINDINGS: MRI HEAD FINDINGS  The patient could not tolerate the complete examination.  Sagittal T1, axial and coronal diffusion, and axial T2 sequences were obtained and are motion degraded, severely so on the sagittal T1 sequence.  Brain: There is a moderate-sized acute MCA infarct involving the posterior left frontal lobe and perirolandic region. No associated hemorrhage is identified on this limited study. No mass, midline shift, or extra-axial fluid collection is seen. There is mild generalized cerebral atrophy. Chronic lacunar infarcts are present in the deep gray nuclei and deep cerebral white matter bilaterally as well as in the right cerebellum. Patchy to confluent T2 hyperintensities elsewhere in the cerebral white matter bilaterally are similar to the prior MRI and compatible with moderately advanced chronic small vessel ischemic disease.  Vascular: Major intracranial vascular flow voids are preserved.  Skull and upper cervical spine: No suspicious marrow lesion identified.  Sinuses/Orbits: Bilateral cataract extraction. Minimal mucosal thickening in the paranasal sinuses. Clear mastoid air cells.  Other: None.  MRA HEAD FINDINGS  There is intermittent severe motion artifact.  The visualized distal vertebral arteries are patent and codominant. The vertebrobasilar junction and proximal half of the basilar artery are completely obscured by motion artifact. The distal basilar artery is widely patent. Posterior communicating arteries are diminutive or absent. The P1 segments are patent, however artifact results in nondiagnostic evaluation of the more distal PCAs.  The horizontal petrous segments of both ICAs are completely obscured by motion artifact. The intracranial ICAs are patent proximal and distal to this without evidence of high-grade stenosis. The proximal left M1 segment is patent, however motion artifact obscures the distal M1 segment and MCA bifurcation and patency at this level cannot be established. There are multiple missing left  MCA branch vessels, particularly in the superior division. The right MCA and both ACAs are grossly patent. No large aneurysm is identified.  IMPRESSION: 1. Severely motion degraded, incomplete examination. 2. Moderate-sized left MCA infarct primarily involving the posterolateral frontal lobe. 3. Moderately advanced chronic small vessel ischemic disease. 4. Limited MRA due to motion. Patent proximal left M1 segment with obscured distal M1 segment and bifurcation. Multiple missing left MCA branch vessels, particularly in the superior division         THE THE THE BRAIN MRI MRA REVIEWED IN PERSON.  The MRI shows a faintly increased signal on DWI involving the left frontal and temporal region.  The faintness suggests more of a subacute onset and not in acute event.  There is corresponding reduced signal seen on the ADC scan.  There is moderate periventricular and deep white matter increased signal consistent with chronic microvascular changes.  His MRA shows dropout of signal involving distal 1/2 of the M1 segment on the left side.  There is also a drug signal involving the distal basilar and PCA bilaterally.  The study is significantly degraded however by movement artifact.     REPEAT HEAD CT  FINDINGS: Brain: Moderate to large area of cytotoxic edema in the central left MCA distribution at the frontal lobe. When allowing for motion artifact on yesterday's brain MRI there is no convincing progression. By CT the edema has become more dense and well-defined. Allowing for residual blurred cortex along the inferior margin there is no hemorrhagic conversion. No new distribution of infarct is seen. Chronic small vessel ischemic change in the deep cerebral white matter. No hydrocephalus. No shift.  Vascular: Atherosclerotic calcification.  No hyperdense vessel.  Skull: Negative  Sinuses/Orbits: Negative  IMPRESSION: Moderate to large left MCA branch infarct without  hemorrhagic conversion or suspected progression since yesterday          HEAD CTA FINDINGS: CT HEAD  BRAIN: LEFT frontal parietal loss of gray-white matter differentiation with mild edema in regional mass effect. Patchy to confluent supratentorial white matter hypodensities exclusive of aforementioned abnormality. No midline shift, mass effect. Old suspected bilateral basal ganglia and LEFT thalamus lacunar infarcts. No abnormal extra-axial fluid collections. Basal cisterns are patent.  VASCULAR: Moderate calcific atherosclerosis of the carotid siphons.  SKULL: No skull fracture. No significant scalp soft tissue swelling.  SINUSES/ORBITS: The mastoid air-cells and included paranasal sinuses are well-aerated.The included ocular globes and orbital contents are non-suspicious. Status post bilateral ocular lens implants.  OTHER: None.  CTA HEAD FINDINGS:  ANTERIOR CIRCULATION: Patent cervical internal carotid arteries, petrous, cavernous and supra clinoid internal carotid arteries. Calcified plaque resulting in moderate stenosis carotid siphons. Patent anterior communicating artery. Occluded proximal LEFT M2 segment, superior division. Otherwise patent anterior and middle cerebral arteries. Scattered severe stenoses bilateral anterior and middle cerebral arteries.  No large vessel occlusion, significant stenosis, contrast extravasation or aneurysm.  POSTERIOR CIRCULATION: Patent vertebral arteries, vertebrobasilar junction and basilar artery, as well as main branch vessels. Patent posterior cerebral arteries. Severe tandem stenosis bilateral posterior cerebral arteries.  No large vessel occlusion, significant stenosis, contrast extravasation or aneurysm.  VENOUS SINUSES: Major dural venous sinuses are patent though not tailored for evaluation on this angiographic examination.  ANATOMIC VARIANTS: None.  DELAYED PHASE: No abnormal intracranial  enhancement.  MIP images reviewed.  IMPRESSION: CT HEAD:  1. Acute to subacute appearing LEFT frontoparietal/MCA territory nonhemorrhagic infarct. 2. Moderate to severe chronic small vessel ischemic disease, old lacunar infarcts.  CTA HEAD:  1. LEFT M2 occlusion, considering above findings this is likely acute to subacute. 2. Advanced intracranial atherosclerosis resulting in scattered severe stenoses anterior and posterior circulation.      The repeat head CT is reviewed and shows he moderate increase in the edema involving the left frontoparietal region.  No hemorrhage is seen and no evidence of extension of the infarct.       Davanna He A. Merlene Laughter, M.D.  Diplomate, Tax adviser of Psychiatry and Neurology ( Neurology). 06/17/2017, 6:00 PM

## 2017-06-17 NOTE — Progress Notes (Signed)
[place] [facility] re[facility] admissions - I was asked to review this case for possible inpatient rehab admission.  I do think that this patient could benefit and tolerate inpatient rehab.  I do not have an available bed today.  I will follow up on Monday for progress and potential acute inpatient rehab admission pending bed availability and if patient remains in the hospital.  Call me for questions.  #[phone]

## 2017-06-17 NOTE — Care Management (Signed)
Attending notified that CIR does not have beds available currently. Patient is being worked up for SNF also.  If patient has not DC'd over the weekend, CM will f/u with CIR on Monday morning. Family updated.  Family is very interested in CIR.

## 2017-06-17 NOTE — Care Management (Signed)
Discussed CIR recommendation with CIR admissions coordinator. She will review patient's chart and call CM back.

## 2017-06-17 NOTE — Telephone Encounter (Signed)
She is currently inpatient at the hospital. Will discuss upon discharge.

## 2017-06-17 NOTE — Plan of Care (Signed)
  Coping: Level of anxiety will decrease 06/17/2017 2125 - Progressing by Cassandria Anger, RN

## 2017-06-17 NOTE — Progress Notes (Signed)
  Speech Language Pathology Treatment:    Patient Details Name: Sandra [name] MRN: [id number] DOB: Jan 02, 1941 Today's Date: 06/17/2017 Time: 6144-3154 SLP Time Calculation (min) (ACUTE ONLY): 20 min  Assessment / Plan / Recommendation Clinical Impression  Pt seen for aphasia treatment this date. Pt's son, [name], present during beginning of tx and stated that pt's status has worsened since yesterday, now not using R arm functionally and some R side neglect. Pt continues to present with global aphasia and apraxia, noting some groping for words today. Pt independently nodded and said "mmhmm" in response to yes/ no questions. When son offered peas, pt shook head and gestured for him to put them down. Using melodic intonation techniques, pt was able to verbalize approximations for automatic speech tasks (counting, stating months of year, first name); she appropriately laughed after completing these tasks. Pt attempted to answer additional questions but was unable to do so; repeatedly stated "um" or "mmm". Also attempted direction following; when pt was asked to point to cup she looked at cup but did not point; unable to open mouth on command, touch nose, etc without max assist. Pt was having dinner and was able to use cup/ utensils when they were placed in left hand. Pt will continue to benefit from speech therapy for functional communication tasks/ identify effective means of communication.  HPI HPI: 77 y.o.female,w hypertension, hyperlipidemia, Dm2, CVA, apparently not taking bp medication for the past 2 weeks presents with dysarthria and aphasia. Pt is not taking aspirin at home. Per teleneurology last known well 1200 on 06/14/2017. Pt has difficulty with comprehension as well it appears.The patient was noted to have an acute CVA by MRI study. Dr. Merlene Laughter notes: The MRI shows a faintly increased signal on DWI involving the left frontal and temporal region. The faintness suggests more of a subacute  onset and not in acute event. Pt passed RN swallow screen and SLE ordered as part of stroke protocol. Pt's son is a physical therapist here at [facility].      SLP Plan  Continue with current plan of care       Recommendations                   Oral Care Recommendations: Oral care BID Follow up Recommendations: Inpatient Rehab SLP Visit Diagnosis: Aphasia (R47.01) Frontal lobe and executive function deficit following: Cerebral infarction Plan: Continue with current plan of care       GO                Kern Reap, [place], [place] 06/17/2017, 5:49 PM

## 2017-06-18 LAB — GLUCOSE, CAPILLARY
GLUCOSE-CAPILLARY: 203 mg/dL — AB (ref 65–99)
Glucose-Capillary: 148 mg/dL — ABNORMAL HIGH (ref 65–99)
Glucose-Capillary: 195 mg/dL — ABNORMAL HIGH (ref 65–99)

## 2017-06-18 MED ORDER — POLYETHYLENE GLYCOL 3350 17 G PO PACK
17.0000 g | PACK | Freq: Once | ORAL | Status: AC
  Administered 2017-06-18: 17 g via ORAL
  Filled 2017-06-18: qty 1

## 2017-06-18 MED ORDER — SENNOSIDES-DOCUSATE SODIUM 8.6-50 MG PO TABS
2.0000 | ORAL_TABLET | Freq: Every day | ORAL | Status: DC
  Administered 2017-06-18 – 2017-06-19 (×2): 2 via ORAL
  Filled 2017-06-18 (×2): qty 2

## 2017-06-18 NOTE — Progress Notes (Addendum)
Physical Therapy Treatment Patient Details Name: Sandra [name] MRN: [id number] DOB: 1940-10-17 Today's Date: 06/18/2017    History of Present Illness Sandra [name]  is a 77 y.o. female, w hypertension, hyperlipidemia, Dm2, CVA,  apparently not taking bp medication for the past 2 weeks presents with dysarthria.  Pt is not taking aspirin at home. Per teleneurology last known well 1200 on 06/14/2017.  Pt has difficulty with comprehension as well it appears.  Imaging indicated a Left MCA infarct. Patient's functional status declined as of yesterady.     PT Comments    This session patient was found asleep in bed. Patient was easy to arouse verbally. Patient's blood pressure was taken and found to be 152/66. Patient's nurse assisted with mobility this session. Patient required moderate assistance to perform supine to sit with HOB elevated. Patient was able to assist when provided tactile cues to lower extremities and used left upper extremity to help pull up. Patient required max assist of 2 in order to perform sit to stand no equipment used this session. Patient was able to assist some with moving lower extremities during stand pivot transfer, but required max assist to maintain standing balance and to coordinate foot movement. Patient performed seated exercises of LAQ, marching, toe raises, and upper extremity reaching on the left side, in order to promote improvement in coordination and for neurological retraining. Patient tolerated all exercises well and required assistance through demonstration tactile cueing and manual assistance on right lower extremity exercises. Patient would benefit from continued skilled physical therapy to continue addressing patient's deficits in strength, coordination, balance, basic transfers, bed mobility, and gait as she progresses. Patient would benefit from inpatient rehab stay as she demonstrated good motivation for all interventions and could tolerate intensive  therapy. Patient was left in chair with chair alarm on, patient's right upper extremity supported and in sight of patient with call bell and phone within reach.   Follow Up Recommendations  CIR(If CIR is unavailable, Family would like STR)     Equipment Recommendations  Other (comment)(to be determined by facility)    Recommendations for Other Services Rehab consult     Precautions / Restrictions Precautions Precautions: Fall Precaution Comments: truncal ataxia, gross coordination deficits Restrictions Weight Bearing Restrictions: No Other Position/Activity Restrictions: Positioning of right upper extremity in sight of patient and not pulling on it as it is flaccid.     Mobility  Bed Mobility Overal bed mobility: Needs Assistance Bed Mobility: Supine to Sit     Supine to sit: Mod assist;HOB elevated(Uses left upper extremity to pull up to sitting)     General bed mobility comments: Patient demonstrated ability to respond to tactile/visual cues in legs, uses LUE and hand held assist to pull self up, still requires heavy trunk assist.  Transfers Overall transfer level: Needs assistance Equipment used: None(Patient was not able to use right upper extremity today. ) Transfers: Stand Pivot Transfers;Sit to/from Stand Sit to Stand: Max assist Stand pivot transfers: +2 physical assistance;Total assist(Assist to maintain balance and help patient move feet)       General transfer comment: participated, but RLE gross coordination is limited and motor processing is slow  Ambulation/Gait Ambulation/Gait assistance: (Not appropriate at this time; patient able to assist some with foot movement during stand pivot transfer)               Stairs            Wheelchair Mobility    Modified Rankin (Stroke Patients Only)  Balance Overall balance assessment: Needs assistance Sitting-balance support: Feet supported Sitting balance-Leahy Scale: Zero Sitting balance  - Comments: Patient can maintain seated balance for up to 20 second bouts    Standing balance support: (Patient supported by physical assistance of 2) Standing balance-Leahy Scale: Zero                              Cognition Arousal/Alertness: Awake/alert Behavior During Therapy: WFL for tasks assessed/performed Overall Cognitive Status: Impaired/Different from baseline Area of Impairment: Following commands                       Following Commands: Follows one step commands inconsistently       General Comments: Patient responded best to visual and tactile cueing this session. Therapist provided demonstration. Patient demonstrated inconsistent use of basic vocalizations for yes/no, inititates and ceases activity as directed with tactile cueing, but difficulty with motor processing. Therapist drew pictures to give patient options for lunch choices and patient was able to point to her choice.       Exercises General Exercises - Lower Extremity Long Arc Quad: 15 reps;Seated;Both;AAROM(AROM on left; AAROM on right) Hip Flexion/Marching: AROM;AAROM;Both;15 reps;Seated(AROM on the left; AAROM on the right) Toe Raises: AAROM;AROM;Both;10 reps;Seated(AROM on the left; AAROM on the right) Other Exercises Other Exercises: Seated LUE Left trunk lean/reach x10.     General Comments        Pertinent Vitals/Pain Pain Assessment: Faces Faces Pain Scale: No hurt    Home Living                      Prior Function            PT Goals (current goals can now be found in the care plan section) Acute Rehab PT Goals Patient Stated Goal: none verbalized by patient    Frequency    7X/week      PT Plan Current plan remains appropriate(Discharged to CIR)    Co-evaluation              AM-PAC PT "6 Clicks" Daily Activity  Outcome Measure  Difficulty turning over in bed (including adjusting bedclothes, sheets and blankets)?: Unable Difficulty  moving from lying on back to sitting on the side of the bed? : A Lot Difficulty sitting down on and standing up from a chair with arms (e.g., wheelchair, bedside commode, etc,.)?: Unable Help needed moving to and from a bed to chair (including a wheelchair)?: Total Help needed walking in hospital room?: Total Help needed climbing 3-5 steps with a railing? : Total 6 Click Score: 7    End of Session Equipment Utilized During Treatment: Gait belt Activity Tolerance: Patient tolerated treatment well;Patient limited by fatigue Patient left: in chair;with call bell/phone within reach;with chair alarm set Nurse Communication: Mobility status;Other (comment)(Patient's location ) PT Visit Diagnosis: Unsteadiness on feet (R26.81);Other abnormalities of gait and mobility (R26.89);Muscle weakness (generalized) (M62.81);Difficulty in walking, not elsewhere classified (R26.2)     Time: 8099-8338 PT Time Calculation (min) (ACUTE ONLY): 21 min  Charges:  $Neuromuscular Re-education: 8-22 mins                    G Codes:      Clarene Critchley PT, DPT 12:02 PM, 06/18/17 [phone]

## 2017-06-18 NOTE — Progress Notes (Signed)
Notified Dr. Manuella Ghazi of blood pressure 184/73. No new orders given at this time. Will continue to monitor.

## 2017-06-18 NOTE — Progress Notes (Signed)
PROGRESS NOTE   Sandra [name]  XNA:[id number]  DOB: 25-Apr-1941  DOA: 06/15/2017 PCP: Caren Macadam, MD  Brief Admission Hx: 77 y.o. female, w hypertension, hyperlipidemia, Dm2, CVA,  apparently not taking bp medication for the past 2 weeks presents with dysarthria.  Pt is not taking aspirin at home. Per teleneurology last known well 1200 on 06/14/2017.  Pt has difficulty with comprehension as well it appears.  The patient was noted to have an acute CVA by MRI study.  MDM/Assessment & Plan:   1. Moderate size left MCA stroke- neurologic consultation recommending dual antiplatelet therapy x 3 months, atorvastatin 40 mg daily, hemoglobin A1c pending, carotid ultrasound studies no significant stenosis, echocardiogram below, PT/OT recommending CIR/SNF.  Pt accepted for CIR hopefully bed will be available on Monday.  SLP following.  Pt only minimal improvement with speech.  Pt had acute change in functional status 2/1, I spoke with neurologist Dr. Merlene Laughter and he recommended getting CT head to evaluate for hemorrhagic transformation.  The CT did not show hemorrhagic transformation.   2. Dyslipidemia-uncontrolled as evidenced by an LDL cholesterol of 150.  I have started the patient on atorvastatin 40 mg daily. 3. Uncontrolled hypertension- given the acute CVA will allow for permissive hypertension and therefore holding home blood pressure medication at this time. 4. Aphasia-Pt will need ongoing SLP therapy and hopefully CIR services. 5. Type 2 diabetes mellitus-carbohydrate modified diet, sliding scale coverage as needed.  A1c 8.2% which means poor control of disease.  DVT prophylaxis: SCDs Code Status: Full Family Communication: No one present during rounds Disposition Plan: CIR vs SNF  Consultants:  Neurology  Procedures:  MRI/MRA, carotid Dopplers and echocardiogram pending  Subjective: The patient remains aphasic.   Objective: Vitals:   06/18/17 0030 06/18/17 0430 06/18/17 0830  06/18/17 1230  BP: (!) 184/73 (!) 161/73 (!) 153/66 (!) 144/69  Pulse: 77 72 75 71  Resp: 18 18  18   Temp: 98.7 F (37.1 C) 99.4 F (37.4 C)  98.9 F (37.2 C)  TempSrc: Axillary Axillary  Axillary  SpO2: 99% 97%  98%  Weight:      Height:        Intake/Output Summary (Last 24 hours) at 06/18/2017 1320 Last data filed at 06/18/2017 1200 Gross per 24 hour  Intake 720 ml  Output -  Net 720 ml   Filed Weights   06/15/17 1417 06/15/17 1942  Weight: 89.8 kg (198 lb) 89.7 kg (197 lb 12 oz)   REVIEW OF SYSTEMS  As per history otherwise all reviewed and reported negative  Exam:  General exam: Lying in the bed, awake, alert, no apparent distress, following simple commands.  Remains aphasic. Respiratory system: Clear. No increased work of breathing. Cardiovascular system: S1 & S2 heard. No JVD, murmurs, gallops, clicks or pedal edema. Gastrointestinal system: Abdomen is nondistended, soft and nontender. Normal bowel sounds heard. Central nervous system: Alert.  Pronounced right hemiparesis involving the right upper extremity, able to move the right lower extremity.  Normal gaze tracking.  Speech, language and comprehension deficits noted. Extremities: no CCE.  Data Reviewed: Basic Metabolic Panel: Recent Labs  Lab 06/15/17 1430 06/15/17 1441 06/16/17 0632  NA 141 141 140  K 3.7 3.5 3.7  CL 100* 100* 101  CO2 30  --  31  GLUCOSE 144* 140* 127*  BUN 17 17 13   CREATININE 0.78 0.80 0.57  CALCIUM 9.1  --  8.8*   Liver Function Tests: Recent Labs  Lab 06/15/17 1430 06/16/17 5427  AST 22 20  ALT 17 14  ALKPHOS 85 71  BILITOT 0.7 0.8  PROT 7.2 6.4*  ALBUMIN 3.6 3.1*   No results for input(s): LIPASE, AMYLASE in the last 168 hours. No results for input(s): AMMONIA in the last 168 hours. CBC: Recent Labs  Lab 06/15/17 1430 06/15/17 1441  WBC 10.1  --   NEUTROABS 6.1  --   HGB 14.3 15.6*  HCT 44.3 46.0  MCV 87.2  --   PLT 264  --    Cardiac Enzymes: No results  for input(s): CKTOTAL, CKMB, CKMBINDEX, TROPONINI in the last 168 hours. CBG (last 3)  Recent Labs    06/17/17 2047 06/18/17 0804 06/18/17 1113  GLUCAP 156* 148* 203*   No results found for this or any previous visit (from the past 240 hour(s)).   Studies: Ct Angio Head W Or Wo Contrast  Result Date: 06/16/2017 CLINICAL DATA:  Subacute LEFT infarct, dysarthria for 2 weeks. History of hypertension, diabetes. EXAM: CT ANGIOGRAPHY HEAD TECHNIQUE: Multidetector CT imaging of the head was performed using the standard protocol during bolus administration of intravenous contrast. Multiplanar CT image reconstructions and MIPs were obtained to evaluate the vascular anatomy. CONTRAST:  58mL ISOVUE-370 IOPAMIDOL (ISOVUE-370) INJECTION 76% COMPARISON:  MRI/MRA head June 16, 2017. Carotid ultrasound June 16, 2017 FINDINGS: CT HEAD BRAIN: LEFT frontal parietal loss of gray-white matter differentiation with mild edema in regional mass effect. Patchy to confluent supratentorial white matter hypodensities exclusive of aforementioned abnormality. No midline shift, mass effect. Old suspected bilateral basal ganglia and LEFT thalamus lacunar infarcts. No abnormal extra-axial fluid collections. Basal cisterns are patent. VASCULAR: Moderate calcific atherosclerosis of the carotid siphons. SKULL: No skull fracture. No significant scalp soft tissue swelling. SINUSES/ORBITS: The mastoid air-cells and included paranasal sinuses are well-aerated.The included ocular globes and orbital contents are non-suspicious. Status post bilateral ocular lens implants. OTHER: None. CTA HEAD FINDINGS: ANTERIOR CIRCULATION: Patent cervical internal carotid arteries, petrous, cavernous and supra clinoid internal carotid arteries. Calcified plaque resulting in moderate stenosis carotid siphons. Patent anterior communicating artery. Occluded proximal LEFT M2 segment, superior division. Otherwise patent anterior and middle cerebral arteries.  Scattered severe stenoses bilateral anterior and middle cerebral arteries. No large vessel occlusion, significant stenosis, contrast extravasation or aneurysm. POSTERIOR CIRCULATION: Patent vertebral arteries, vertebrobasilar junction and basilar artery, as well as main branch vessels. Patent posterior cerebral arteries. Severe tandem stenosis bilateral posterior cerebral arteries. No large vessel occlusion, significant stenosis, contrast extravasation or aneurysm. VENOUS SINUSES: Major dural venous sinuses are patent though not tailored for evaluation on this angiographic examination. ANATOMIC VARIANTS: None. DELAYED PHASE: No abnormal intracranial enhancement. MIP images reviewed. IMPRESSION: CT HEAD: 1. Acute to subacute appearing LEFT frontoparietal/MCA territory nonhemorrhagic infarct. 2. Moderate to severe chronic small vessel ischemic disease, old lacunar infarcts. CTA HEAD: 1. LEFT M2 occlusion, considering above findings this is likely acute to subacute. 2. Advanced intracranial atherosclerosis resulting in scattered severe stenoses anterior and posterior circulation. These results will be called to the ordering clinician or representative by the Radiologist Assistant, and communication documented in the PACS or zVision Dashboard. Electronically Signed   By: Elon Alas M.D.   On: 06/16/2017 18:42   Ct Head Wo Contrast  Result Date: 06/17/2017 CLINICAL DATA:  Stroke follow-up. EXAM: CT HEAD WITHOUT CONTRAST TECHNIQUE: Contiguous axial images were obtained from the base of the skull through the vertex without intravenous contrast. COMPARISON:  Head CT from yesterday.  Brain MRI from yesterday. FINDINGS: Brain: Moderate to large area of  cytotoxic edema in the central left MCA distribution at the frontal lobe. When allowing for motion artifact on yesterday's brain MRI there is no convincing progression. By CT the edema has become more dense and well-defined. Allowing for residual blurred cortex along  the inferior margin there is no hemorrhagic conversion. No new distribution of infarct is seen. Chronic small vessel ischemic change in the deep cerebral white matter. No hydrocephalus. No shift. Vascular: Atherosclerotic calcification.  No hyperdense vessel. Skull: Negative Sinuses/Orbits: Negative IMPRESSION: Moderate to large left MCA branch infarct without hemorrhagic conversion or suspected progression since yesterday. Electronically Signed   By: Monte Fantasia M.D.   On: 06/17/2017 13:10   Scheduled Meds: . aspirin  300 mg Rectal Daily   Or  . aspirin  325 mg Oral Daily  . atorvastatin  40 mg Oral q1800  . citalopram  10 mg Oral Daily  . clopidogrel  75 mg Oral Q breakfast  . insulin aspart  0-9 Units Subcutaneous TID WC  . vitamin B-12  1,000 mcg Oral Daily   Continuous Infusions:  Principal Problem:   Stroke [facility]) Active Problems:   Essential hypertension   Type 2 diabetes mellitus with neurological complications ([place])   Hyperlipidemia associated with type 2 diabetes mellitus ([place])  Time spent:   Irwin Brakeman, MD, FAAFP [facility] Pager [phone]  If 7PM-7AM, please contact night-coverage [email or web address] Password [email or web address] 06/18/2017, 1:20 PM    LOS: 3 days

## 2017-06-18 NOTE — Progress Notes (Signed)
CSW spoke with pt's son [name] concerning disposition plan for pt if  discharging over the weekend.  [facility] has accepted pt for SNF.  Pt will have to be discharged to the first available SNF. Pt's son understands.   Reed Breech LCSWA [phone]

## 2017-06-19 ENCOUNTER — Inpatient Hospital Stay (HOSPITAL_COMMUNITY): Payer: Medicare Other

## 2017-06-19 LAB — CBC WITH DIFFERENTIAL/PLATELET
BASOS PCT: 0 %
Basophils Absolute: 0 10*3/uL (ref 0.0–0.1)
EOS ABS: 0 10*3/uL (ref 0.0–0.7)
Eosinophils Relative: 0 %
HCT: 43 % (ref 36.0–46.0)
Hemoglobin: 14 g/dL (ref 12.0–15.0)
LYMPHS ABS: 1.7 10*3/uL (ref 0.7–4.0)
Lymphocytes Relative: 12 %
MCH: 28.1 pg (ref 26.0–34.0)
MCHC: 32.6 g/dL (ref 30.0–36.0)
MCV: 86.3 fL (ref 78.0–100.0)
MONO ABS: 1.9 10*3/uL — AB (ref 0.1–1.0)
MONOS PCT: 12 %
Neutro Abs: 11.4 10*3/uL — ABNORMAL HIGH (ref 1.7–7.7)
Neutrophils Relative %: 76 %
Platelets: 248 10*3/uL (ref 150–400)
RBC: 4.98 MIL/uL (ref 3.87–5.11)
RDW: 13.9 % (ref 11.5–15.5)
WBC: 15.1 10*3/uL — ABNORMAL HIGH (ref 4.0–10.5)

## 2017-06-19 LAB — GLUCOSE, CAPILLARY
GLUCOSE-CAPILLARY: 349 mg/dL — AB (ref 65–99)
GLUCOSE-CAPILLARY: 308 mg/dL — AB (ref 65–99)
GLUCOSE-CAPILLARY: 259 mg/dL — AB (ref 65–99)
Glucose-Capillary: 484 mg/dL — ABNORMAL HIGH (ref 65–99)
Glucose-Capillary: 384 mg/dL — ABNORMAL HIGH (ref 65–99)

## 2017-06-19 LAB — COMPREHENSIVE METABOLIC PANEL
ALBUMIN: 3.2 g/dL — AB (ref 3.5–5.0)
ALK PHOS: 77 U/L (ref 38–126)
ALT: 15 U/L (ref 14–54)
ANION GAP: 12 (ref 5–15)
AST: 25 U/L (ref 15–41)
BILIRUBIN TOTAL: 1.2 mg/dL (ref 0.3–1.2)
BUN: 27 mg/dL — ABNORMAL HIGH (ref 6–20)
CALCIUM: 9.1 mg/dL (ref 8.9–10.3)
CO2: 28 mmol/L (ref 22–32)
Chloride: 97 mmol/L — ABNORMAL LOW (ref 101–111)
Creatinine, Ser: 0.97 mg/dL (ref 0.44–1.00)
GFR calc non Af Amer: 55 mL/min — ABNORMAL LOW (ref >60)
GLUCOSE: 365 mg/dL — AB (ref 65–99)
POTASSIUM: 4 mmol/L (ref 3.5–5.1)
Sodium: 137 mmol/L (ref 135–145)
TOTAL PROTEIN: 7.2 g/dL (ref 6.5–8.1)

## 2017-06-19 LAB — SEDIMENTATION RATE: SED RATE: 66 mm/h — AB (ref 0–22)

## 2017-06-19 LAB — C-REACTIVE PROTEIN: CRP: 19.8 mg/dL — AB (ref <1.0)

## 2017-06-19 LAB — TSH: TSH: 0.717 u[IU]/mL (ref 0.350–4.500)

## 2017-06-19 MED ORDER — BISACODYL 10 MG RE SUPP
10.0000 mg | Freq: Once | RECTAL | Status: AC
  Administered 2017-06-19: 10 mg via RECTAL
  Filled 2017-06-19: qty 1

## 2017-06-19 MED ORDER — INSULIN ASPART 100 UNIT/ML ~~LOC~~ SOLN
0.0000 [IU] | Freq: Every day | SUBCUTANEOUS | Status: DC
Start: 2017-06-19 — End: 2017-06-20
  Administered 2017-06-19: 3 [IU] via SUBCUTANEOUS

## 2017-06-19 MED ORDER — INSULIN GLARGINE 100 UNIT/ML ~~LOC~~ SOLN: 20.0000 [IU] | Freq: Every day | SUBCUTANEOUS | Status: DC

## 2017-06-19 MED ORDER — INSULIN ASPART 100 UNIT/ML ~~LOC~~ SOLN
0.0000 [IU] | Freq: Three times a day (TID) | SUBCUTANEOUS | Status: DC
  Administered 2017-06-19: 11 [IU] via SUBCUTANEOUS
  Administered 2017-06-19: 15 [IU] via SUBCUTANEOUS
  Administered 2017-06-20: 8 [IU] via SUBCUTANEOUS
  Administered 2017-06-20: 5 [IU] via SUBCUTANEOUS

## 2017-06-19 MED ORDER — POLYETHYLENE GLYCOL 3350 17 G PO PACK
17.0000 g | PACK | Freq: Once | ORAL | Status: AC
  Administered 2017-06-19: 17 g via ORAL
  Filled 2017-06-19: qty 1

## 2017-06-19 MED ORDER — INSULIN ASPART 100 UNIT/ML ~~LOC~~ SOLN
8.0000 [IU] | Freq: Three times a day (TID) | SUBCUTANEOUS | Status: DC
  Administered 2017-06-19 – 2017-06-20 (×3): 8 [IU] via SUBCUTANEOUS

## 2017-06-19 MED ORDER — INSULIN GLARGINE 100 UNIT/ML ~~LOC~~ SOLN
15.0000 [IU] | SUBCUTANEOUS | Status: DC
  Filled 2017-06-19: qty 0.15

## 2017-06-19 MED ORDER — INSULIN ASPART 100 UNIT/ML ~~LOC~~ SOLN: 6.0000 [IU] | Freq: Three times a day (TID) | SUBCUTANEOUS | Status: DC

## 2017-06-19 MED ORDER — INSULIN GLARGINE 100 UNIT/ML ~~LOC~~ SOLN
10.0000 [IU] | SUBCUTANEOUS | Status: DC
  Administered 2017-06-19: 10 [IU] via SUBCUTANEOUS
  Filled 2017-06-19 (×2): qty 0.1

## 2017-06-19 MED ORDER — INSULIN ASPART 100 UNIT/ML ~~LOC~~ SOLN
6.0000 [IU] | Freq: Three times a day (TID) | SUBCUTANEOUS | Status: DC
  Administered 2017-06-19: 6 [IU] via SUBCUTANEOUS

## 2017-06-19 MED ORDER — SODIUM CHLORIDE 0.9 % IV SOLN
3.0000 g | Freq: Four times a day (QID) | INTRAVENOUS | Status: DC
  Administered 2017-06-19 – 2017-06-20 (×4): 3 g via INTRAVENOUS
  Filled 2017-06-19 (×11): qty 3

## 2017-06-19 NOTE — Progress Notes (Signed)
Dr. Manuella Ghazi notified via phone that patient is febrile with temp of 101.4 and BP 189/76.  No new orders given at this time.  Nursing staff to continue to monitor

## 2017-06-19 NOTE — Evaluation (Signed)
Clinical/Bedside Swallow Evaluation Patient Details  Name: Sandra [name] MRN: [id number] Date of Birth: [birth year]  Today's Date: 06/19/2017 Time: SLP Start Time (ACUTE ONLY): 0945 SLP Stop Time (ACUTE ONLY): 1024 SLP Time Calculation (min) (ACUTE ONLY): 39 min  Past Medical History:  Past Medical History:  Diagnosis Date  . Colon polyp   . Constipation   . Diabetes mellitus without complication ([place])   . GI bleed   . Hemorrhoids   . High cholesterol   . Hypertension   . PAD (peripheral artery disease) ([place])   . Peripheral neuropathy   . Rectal bleeding    "for 7 years"  . Rectal bleeding   . Stroke [facility])    Past Surgical History:  Past Surgical History:  Procedure Laterality Date  . ABDOMINAL HYSTERECTOMY    . COLONOSCOPY     X 7  . COLONOSCOPY N/A 03/21/2017   Procedure: COLONOSCOPY;  Surgeon: Danie Binder, MD;  Location: [facility];  Service: Endoscopy;  Laterality: N/A;  1215    HPI:  77 y.o.female,w hypertension, hyperlipidemia, Dm2, CVA, apparently not taking bp medication for the past 2 weeks presents with dysarthria and aphasia. Pt is not taking aspirin at home. Per teleneurology last known well 1200 on 06/14/2017. Pt has difficulty with comprehension as well it appears.The patient was noted to have an acute CVA by MRI study. Dr. Merlene Laughter notes: The MRI shows a faintly increased signal on DWI involving the left frontal and temporal region. The faintness suggests more of a subacute onset and not in acute event. Pt passed RN swallow screen and SLE ordered as part of stroke protocol. Pt's son is a physical therapist here at [facility].   Assessment / Plan / Recommendation Clinical Impression  Clinical swallow evaluation completed due to decline in function since admission when she passed RN swallow screen. Pt's daughter and son-in-law present for evaluation and Pt sitting upright in chair upon SLP arrival. Pt less interactive than when seen on Thursday by  this SLP with noted increased right facial weakness and right UE weakness. Pt with improved interaction and alertness when helping in functional tasks (oral care and self feeding). Minimal weak vocalizations elicited today and Pt unable to voice or cough on command. She smiled appropriately at times and tapped her hand to music when gospel music played for her. Pt with pocketing in right buccal cavity and some lingual residue from breakfast noted during oral care. Pt with reduced labial closure when drinking from cup and had occasional difficulty sucking from the straw; ;suspect delay in swallow initiation. Pt with only occasional throat clearing and weak cough (delayed) after po trials.   Given declined in function, inability to generate a volitional cough, and pocketing noted on right side, will downgrade to D1/puree and nectar-thick liquids and proceed with MBSS tomorrow to objectively evaluate swallow function. OK for Pt to have free water before/after oral care and separately from meals (needs to have NTL with meals); PO meds whole in puree. SLP to follow.   SLP Visit Diagnosis: Dysphagia, oropharyngeal phase (R13.12)    Aspiration Risk       Diet Recommendation Dysphagia 1 (Puree);Nectar-thick liquid;Ice chips PRN after oral care;Free water protocol after oral care   Liquid Administration via: Cup;Straw Medication Administration: Whole meds with liquid Supervision: Staff to assist with self feeding;Full supervision/cueing for compensatory strategies Compensations: Minimize environmental distractions;Slow rate;Small sips/bites;Lingual sweep for clearance of pocketing Postural Changes: Seated upright at 90 degrees;Remain upright for at least 30  minutes after po intake    Other  Recommendations Oral Care Recommendations: Oral care before and after PO;Staff/trained caregiver to provide oral care(Pt able to completed if items brought to her) Other Recommendations: Clarify dietary restrictions    Follow up Recommendations Inpatient Rehab      Frequency and Duration min 2x/week  1 week       Prognosis Prognosis for Safe Diet Advancement: Fair Barriers to Reach Goals: Language deficits;Severity of deficits      Swallow Study   General Date of Onset: 06/15/17 HPI: 77 y.o.female,w hypertension, hyperlipidemia, Dm2, CVA, apparently not taking bp medication for the past 2 weeks presents with dysarthria and aphasia. Pt is not taking aspirin at home. Per teleneurology last known well 1200 on 06/14/2017. Pt has difficulty with comprehension as well it appears.The patient was noted to have an acute CVA by MRI study. Dr. Merlene Laughter notes: The MRI shows a faintly increased signal on DWI involving the left frontal and temporal region. The faintness suggests more of a subacute onset and not in acute event. Pt passed RN swallow screen and SLE ordered as part of stroke protocol. Pt's son is a physical therapist here at [facility]. Type of Study: Bedside Swallow Evaluation Previous Swallow Assessment: None on record; Pt passed RN swallow screen but declined in function since then Diet Prior to this Study: Regular;Thin liquids Temperature Spikes Noted: Yes Respiratory Status: Room air History of Recent Intubation: No Behavior/Cognition: Alert;Cooperative;Requires cueing;Doesn't follow directions(unable to voice or cough on command) Oral Cavity Assessment: (lingual residue from breakfast) Oral Care Completed by SLP: Yes Oral Cavity - Dentition: Poor condition Vision: Impaired for self-feeding(suspect some right neglect) Self-Feeding Abilities: Needs assist Patient Positioning: Upright in chair Baseline Vocal Quality: Not observed Volitional Cough: Cognitively unable to elicit Volitional Swallow: Able to elicit    Oral/Motor/Sensory Function Overall Oral Motor/Sensory Function: Moderate impairment Facial ROM: Reduced right Facial Symmetry: Abnormal symmetry right Facial Strength:  Reduced right Facial Sensation: Within Functional Limits Lingual ROM: Within Functional Limits Lingual Symmetry: Within Functional Limits Lingual Strength: Within Functional Limits Lingual Sensation: Within Functional Limits Velum: (could not assess)   Ice Chips Ice chips: Within functional limits Presentation: Spoon   Thin Liquid Thin Liquid: Impaired Presentation: Cup;Self Fed;Straw Oral Phase Impairments: Reduced labial seal Oral Phase Functional Implications: Right anterior spillage(difficult with suck/swallow sequence with straw) Pharyngeal  Phase Impairments: Suspected delayed Swallow;Cough - Delayed    Nectar Thick Nectar Thick Liquid: Impaired Presentation: Cup;Straw;Self Fed Oral Phase Impairments: Reduced labial seal Pharyngeal Phase Impairments: Suspected delayed Swallow   Honey Thick Honey Thick Liquid: Not tested   Puree Puree: Within functional limits Presentation: Spoon   Solid       Thank you,  Genene Churn, CCC-SLP [phone]  Solid: Impaired Presentation: Self Fed Oral Phase Impairments: Reduced lingual movement/coordination;Impaired mastication Oral Phase Functional Implications: Right lateral sulci pocketing;Prolonged oral transit;Oral residue Pharyngeal Phase Impairments: Suspected delayed Swallow;Multiple swallows        Graiden Henes 06/19/2017,10:55 AM

## 2017-06-19 NOTE — Progress Notes (Signed)
PROGRESS NOTE   Sandra [name]  VZD:[id number]  DOB: [birth year]  DOA: 06/15/2017 PCP: Caren Macadam, MD  Brief Admission Hx: 77 y.o. female, w hypertension, hyperlipidemia, Dm2, CVA,  apparently not taking bp medication for the past 2 weeks presents with dysarthria.  Pt is not taking aspirin at home. Per teleneurology last known well 1200 on 06/14/2017.  Pt has difficulty with comprehension as well it appears.  The patient was noted to have an acute CVA by MRI study.  MDM/Assessment & Plan:   1. Moderate size left MCA stroke- neurologic consultation recommending dual antiplatelet therapy x 3 months, atorvastatin 40 mg daily, hemoglobin A1c pending, carotid ultrasound studies no significant stenosis, echocardiogram below, PT/OT recommending CIR/SNF.  Pt accepted for CIR hopefully bed will be available on Monday.  SLP following.  Pt only minimal improvement with speech.  Pt had acute change in functional status 2/1, I spoke with neurologist Dr. Merlene Laughter and he recommended getting CT head to evaluate for hemorrhagic transformation.  The CT did not show hemorrhagic transformation.  2. Dysphagia - SLP is planning modified barium study tomorrow.   3. Fever - work up including CXR, UA, blood cultures.  4. Aspiration pneumonia - IV unasyn started, follow blood cultures, supportive therapy.  5. Dyslipidemia-uncontrolled as evidenced by an LDL cholesterol of 150.  I have started the patient on atorvastatin 40 mg daily. 6. Constipation - no BM since admission, stool softeners ordered, give suppository.   7. Uncontrolled hypertension- given the acute CVA will allow for permissive hypertension and therefore holding home blood pressure medication at this time. 8. Aphasia-Pt will need ongoing SLP therapy and hopefully CIR services. 9. Type 2 diabetes mellitus uncontrolled-carbohydrate modified diet, sliding scale coverage as needed.  A1c 8.2% which means poor control of disease.  Adding lantus and prandial  novolog and sliding scale insulin coverage.   DVT prophylaxis: SCDs Code Status: Full Family Communication: spoke with son by telephone Disposition Plan: CIR vs SNF  Consultants:  Neurology  Procedures:  Echocardiogram: Study Conclusions - Left ventricle: The cavity size was normal. Wall thickness was   increased in a pattern of mild LVH. Systolic function was normal.   The estimated ejection fraction was in the range of 60% to 65%.   Wall motion was normal; there were no regional wall motion   abnormalities. Doppler parameters are consistent with abnormal   left ventricular relaxation (grade 1 diastolic dysfunction). - Aortic valve: Mildly calcified annulus. Trileaflet. - Mitral valve: Mildly calcified annulus. There was trivial   regurgitation. - Right atrium: Central venous pressure (est): 3 mm Hg. - Atrial septum: No defect or patent foramen ovale was identified. - Pulmonary arteries: Systolic pressure could not be accurately   estimated. - Pericardium, extracardiac: There was no pericardial effusion.  Carotid Dopplers IMPRESSION: No significant carotid stenosis identified in the neck bilaterally. Estimated bilateral ICA stenoses are less than 50%. Minimal to mild plaque present, as above.  Subjective: The patient remains aphasic.  Pt spiked a fever overnight.    Objective: Vitals:   06/19/17 0430 06/19/17 0830 06/19/17 1230 06/19/17 1300  BP: (!) 183/74 (!) 148/58 (!) 142/78 139/73  Pulse: 87 70 76 73  Resp: 17 18 19 18   Temp: 100 F (37.8 C) 100.1 F (37.8 C) 99.1 F (37.3 C) 99 F (37.2 C)  TempSrc:  Oral Oral Oral  SpO2: 93% 96% 95% 97%  Weight:      Height:        Intake/Output Summary (Last 24  hours) at 06/19/2017 1558 Last data filed at 06/19/2017 1300 Gross per 24 hour  Intake 720 ml  Output -  Net 720 ml   Filed Weights   06/15/17 1417 06/15/17 1942  Weight: 89.8 kg (198 lb) 89.7 kg (197 lb 12 oz)   REVIEW OF SYSTEMS  As per history  otherwise all reviewed and reported negative  Exam:  General exam: Lying in the bed, awake, alert, no apparent distress, following simple commands.  Remains aphasic. Respiratory system: Clear. No increased work of breathing. Cardiovascular system: S1 & S2 heard. No JVD, murmurs, gallops, clicks or pedal edema. Gastrointestinal system: Abdomen is nondistended, soft and nontender. Normal bowel sounds heard. Central nervous system: Alert.  Pronounced right hemiparesis involving the right upper extremity, able to move the right lower extremity.  Normal gaze tracking.  Speech, language and comprehension deficits noted. Extremities: no CCE.  Data Reviewed: Basic Metabolic Panel: Recent Labs  Lab 06/15/17 1430 06/15/17 1441 06/16/17 0632 06/19/17 0805  NA 141 141 140 137  K 3.7 3.5 3.7 4.0  CL 100* 100* 101 97*  CO2 30  --  31 28  GLUCOSE 144* 140* 127* 365*  BUN 17 17 13  27*  CREATININE 0.78 0.80 0.57 0.97  CALCIUM 9.1  --  8.8* 9.1   Liver Function Tests: Recent Labs  Lab 06/15/17 1430 06/16/17 0632 06/19/17 0805  AST 22 20 25   ALT 17 14 15   ALKPHOS 85 71 77  BILITOT 0.7 0.8 1.2  PROT 7.2 6.4* 7.2  ALBUMIN 3.6 3.1* 3.2*   No results for input(s): LIPASE, AMYLASE in the last 168 hours. No results for input(s): AMMONIA in the last 168 hours. CBC: Recent Labs  Lab 06/15/17 1430 06/15/17 1441 06/19/17 0805  WBC 10.1  --  15.1*  NEUTROABS 6.1  --  11.4*  HGB 14.3 15.6* 14.0  HCT 44.3 46.0 43.0  MCV 87.2  --  86.3  PLT 264  --  248   Cardiac Enzymes: No results for input(s): CKTOTAL, CKMB, CKMBINDEX, TROPONINI in the last 168 hours. CBG (last 3)  Recent Labs    06/19/17 0754 06/19/17 1058 06/19/17 1206  GLUCAP 349* 484* 384*   Recent Results (from the past 240 hour(s))  Culture, blood (Routine X 2) w Reflex to ID Panel     Status: None (Preliminary result)   Collection Time: 06/19/17  8:14 AM  Result Value Ref Range Status   Specimen Description BLOOD LEFT  HAND  Final   Special Requests   Final    BOTTLES DRAWN AEROBIC AND ANAEROBIC Blood Culture adequate volume Performed at [facility], [address], [place], [place] [address]    Culture PENDING  Incomplete   Report Status PENDING  Incomplete  Culture, blood (Routine X 2) w Reflex to ID Panel     Status: None (Preliminary result)   Collection Time: 06/19/17  8:15 AM  Result Value Ref Range Status   Specimen Description RIGHT ANTECUBITAL  Final   Special Requests   Final    BOTTLES DRAWN AEROBIC AND ANAEROBIC Blood Culture adequate volume Performed at [facility], [address], [place], [place] [address]    Culture PENDING  Incomplete   Report Status PENDING  Incomplete     Studies: Dg Chest Port 1 View  Result Date: 06/19/2017 CLINICAL DATA:  Fever EXAM: PORTABLE CHEST 1 VIEW COMPARISON:  None. FINDINGS: There is mild bilateral interstitial thickening. There is no focal consolidation. There is no pleural effusion or  pneumothorax. The heart and mediastinal contours are unremarkable. The osseous structures are unremarkable. IMPRESSION: Bilateral interstitial thickening which may reflect interstitial infection versus mild interstitial edema. Electronically Signed   By: Kathreen Devoid   On: 06/19/2017 10:37   Scheduled Meds: . aspirin  300 mg Rectal Daily   Or  . aspirin  325 mg Oral Daily  . atorvastatin  40 mg Oral q1800  . citalopram  10 mg Oral Daily  . clopidogrel  75 mg Oral Q breakfast  . insulin aspart  0-15 Units Subcutaneous TID WC  . insulin aspart  0-5 Units Subcutaneous QHS  . insulin aspart  6 Units Subcutaneous TID WC  . [START ON 06/20/2017] insulin glargine  15 Units Subcutaneous BH-q7a  . senna-docusate  2 tablet Oral QHS  . vitamin B-12  1,000 mcg Oral Daily   Continuous Infusions: . ampicillin-sulbactam (UNASYN) IV      Principal Problem:   Stroke [facility]) Active Problems:   Essential hypertension   Type 2 diabetes mellitus with neurological complications  ([place])   Hyperlipidemia associated with type 2 diabetes mellitus ([place])  Time spent:   Irwin Brakeman, MD, FAAFP [facility] Pager [phone]  If 7PM-7AM, please contact night-coverage [email or web address] Password [email or web address] 06/19/2017, 3:58 PM    LOS: 4 days

## 2017-06-19 NOTE — Progress Notes (Signed)
PT Cancellation Note  Patient Details Name: Sandra [name] MRN: [id number] DOB: 1940/10/05   Cancelled Treatment:    Reason Eval/Treat Not Completed: Medical issues which prohibited therapy(Patient's blood glucose was taken during the session and found to be 484. ) Patient only performed some left sided long arc quads x 15 with therapist. Patient was drowsy, and the blood glucose was found to be 484. Physical therapist is deferring further treatment at this time due to medical issues. Nurse notified and giving insulin. Attempted at approximately 11 am.   Clarene Critchley PT, DPT 11:30 AM, 06/19/17 [phone]

## 2017-06-20 ENCOUNTER — Inpatient Hospital Stay (HOSPITAL_COMMUNITY): Payer: Medicare Other

## 2017-06-20 ENCOUNTER — Other Ambulatory Visit: Payer: Self-pay

## 2017-06-20 ENCOUNTER — Inpatient Hospital Stay (HOSPITAL_COMMUNITY)
Admission: RE | Admit: 2017-06-20 | Discharge: 2017-07-14 | DRG: 057 | Disposition: A | Discharge status: Skilled Nursing Facility | Payer: Medicare Other | Source: Intra-hospital | Attending: Physical Medicine & Rehabilitation | Admitting: Physical Medicine & Rehabilitation

## 2017-06-20 ENCOUNTER — Encounter (HOSPITAL_COMMUNITY): Payer: Self-pay | Admitting: *Deleted

## 2017-06-20 DIAGNOSIS — I69391 Dysphagia following cerebral infarction: Secondary | ICD-10-CM | POA: Diagnosis not present

## 2017-06-20 DIAGNOSIS — N390 Urinary tract infection, site not specified: Secondary | ICD-10-CM | POA: Diagnosis present

## 2017-06-20 DIAGNOSIS — N179 Acute kidney failure, unspecified: Secondary | ICD-10-CM | POA: Diagnosis present

## 2017-06-20 DIAGNOSIS — Z794 Long term (current) use of insulin: Secondary | ICD-10-CM

## 2017-06-20 DIAGNOSIS — D72829 Elevated white blood cell count, unspecified: Secondary | ICD-10-CM

## 2017-06-20 DIAGNOSIS — G8111 Spastic hemiplegia affecting right dominant side: Secondary | ICD-10-CM | POA: Diagnosis not present

## 2017-06-20 DIAGNOSIS — E669 Obesity, unspecified: Secondary | ICD-10-CM

## 2017-06-20 DIAGNOSIS — E1149 Type 2 diabetes mellitus with other diabetic neurological complication: Secondary | ICD-10-CM | POA: Diagnosis present

## 2017-06-20 DIAGNOSIS — I639 Cerebral infarction, unspecified: Secondary | ICD-10-CM | POA: Diagnosis not present

## 2017-06-20 DIAGNOSIS — F329 Major depressive disorder, single episode, unspecified: Secondary | ICD-10-CM | POA: Diagnosis present

## 2017-06-20 DIAGNOSIS — E1151 Type 2 diabetes mellitus with diabetic peripheral angiopathy without gangrene: Secondary | ICD-10-CM | POA: Diagnosis present

## 2017-06-20 DIAGNOSIS — I10 Essential (primary) hypertension: Secondary | ICD-10-CM | POA: Diagnosis present

## 2017-06-20 DIAGNOSIS — M25561 Pain in right knee: Secondary | ICD-10-CM | POA: Diagnosis present

## 2017-06-20 DIAGNOSIS — E785 Hyperlipidemia, unspecified: Secondary | ICD-10-CM | POA: Diagnosis present

## 2017-06-20 DIAGNOSIS — I69320 Aphasia following cerebral infarction: Secondary | ICD-10-CM | POA: Diagnosis not present

## 2017-06-20 DIAGNOSIS — R4701 Aphasia: Secondary | ICD-10-CM | POA: Diagnosis not present

## 2017-06-20 DIAGNOSIS — E870 Hyperosmolality and hypernatremia: Secondary | ICD-10-CM | POA: Diagnosis present

## 2017-06-20 DIAGNOSIS — R509 Fever, unspecified: Secondary | ICD-10-CM | POA: Diagnosis not present

## 2017-06-20 DIAGNOSIS — Z6832 Body mass index (BMI) 32.0-32.9, adult: Secondary | ICD-10-CM | POA: Diagnosis not present

## 2017-06-20 DIAGNOSIS — Z833 Family history of diabetes mellitus: Secondary | ICD-10-CM

## 2017-06-20 DIAGNOSIS — I161 Hypertensive emergency: Secondary | ICD-10-CM | POA: Diagnosis not present

## 2017-06-20 DIAGNOSIS — E1142 Type 2 diabetes mellitus with diabetic polyneuropathy: Secondary | ICD-10-CM | POA: Diagnosis not present

## 2017-06-20 DIAGNOSIS — Z7982 Long term (current) use of aspirin: Secondary | ICD-10-CM

## 2017-06-20 DIAGNOSIS — R202 Paresthesia of skin: Secondary | ICD-10-CM | POA: Diagnosis not present

## 2017-06-20 DIAGNOSIS — E876 Hypokalemia: Secondary | ICD-10-CM | POA: Diagnosis present

## 2017-06-20 DIAGNOSIS — R32 Unspecified urinary incontinence: Secondary | ICD-10-CM | POA: Diagnosis present

## 2017-06-20 DIAGNOSIS — I69353 Hemiplegia and hemiparesis following cerebral infarction affecting right non-dominant side: Secondary | ICD-10-CM | POA: Diagnosis present

## 2017-06-20 DIAGNOSIS — M7989 Other specified soft tissue disorders: Secondary | ICD-10-CM | POA: Diagnosis not present

## 2017-06-20 DIAGNOSIS — R339 Retention of urine, unspecified: Secondary | ICD-10-CM | POA: Diagnosis present

## 2017-06-20 DIAGNOSIS — B9620 Unspecified Escherichia coli [E. coli] as the cause of diseases classified elsewhere: Secondary | ICD-10-CM | POA: Diagnosis present

## 2017-06-20 DIAGNOSIS — R159 Full incontinence of feces: Secondary | ICD-10-CM | POA: Diagnosis present

## 2017-06-20 DIAGNOSIS — E1169 Type 2 diabetes mellitus with other specified complication: Secondary | ICD-10-CM | POA: Diagnosis not present

## 2017-06-20 DIAGNOSIS — I69398 Other sequelae of cerebral infarction: Secondary | ICD-10-CM

## 2017-06-20 DIAGNOSIS — I63511 Cerebral infarction due to unspecified occlusion or stenosis of right middle cerebral artery: Secondary | ICD-10-CM | POA: Diagnosis not present

## 2017-06-20 DIAGNOSIS — R1310 Dysphagia, unspecified: Secondary | ICD-10-CM | POA: Diagnosis present

## 2017-06-20 DIAGNOSIS — Z9119 Patient's noncompliance with other medical treatment and regimen: Secondary | ICD-10-CM

## 2017-06-20 DIAGNOSIS — I672 Cerebral atherosclerosis: Secondary | ICD-10-CM | POA: Diagnosis present

## 2017-06-20 DIAGNOSIS — I63411 Cerebral infarction due to embolism of right middle cerebral artery: Secondary | ICD-10-CM | POA: Diagnosis not present

## 2017-06-20 DIAGNOSIS — Z09 Encounter for follow-up examination after completed treatment for conditions other than malignant neoplasm: Secondary | ICD-10-CM

## 2017-06-20 DIAGNOSIS — G8191 Hemiplegia, unspecified affecting right dominant side: Secondary | ICD-10-CM | POA: Diagnosis not present

## 2017-06-20 DIAGNOSIS — Z91199 Patient's noncompliance with other medical treatment and regimen due to unspecified reason: Secondary | ICD-10-CM

## 2017-06-20 DIAGNOSIS — R609 Edema, unspecified: Secondary | ICD-10-CM | POA: Diagnosis present

## 2017-06-20 DIAGNOSIS — G8101 Flaccid hemiplegia affecting right dominant side: Secondary | ICD-10-CM | POA: Diagnosis not present

## 2017-06-20 LAB — COMPREHENSIVE METABOLIC PANEL
ALT: 13 U/L — ABNORMAL LOW (ref 14–54)
AST: 22 U/L (ref 15–41)
Albumin: 2.8 g/dL — ABNORMAL LOW (ref 3.5–5.0)
Alkaline Phosphatase: 68 U/L (ref 38–126)
Anion gap: 13 (ref 5–15)
BILIRUBIN TOTAL: 0.5 mg/dL (ref 0.3–1.2)
BUN: 32 mg/dL — AB (ref 6–20)
CO2: 29 mmol/L (ref 22–32)
Calcium: 9 mg/dL (ref 8.9–10.3)
Chloride: 99 mmol/L — ABNORMAL LOW (ref 101–111)
Creatinine, Ser: 0.86 mg/dL (ref 0.44–1.00)
Glucose, Bld: 224 mg/dL — ABNORMAL HIGH (ref 65–99)
POTASSIUM: 3.6 mmol/L (ref 3.5–5.1)
Sodium: 141 mmol/L (ref 135–145)
TOTAL PROTEIN: 6.6 g/dL (ref 6.5–8.1)

## 2017-06-20 LAB — CBC WITH DIFFERENTIAL/PLATELET
BASOS ABS: 0 10*3/uL (ref 0.0–0.1)
Basophils Relative: 0 %
EOS PCT: 0 %
Eosinophils Absolute: 0 10*3/uL (ref 0.0–0.7)
HEMATOCRIT: 41.3 % (ref 36.0–46.0)
Hemoglobin: 13.3 g/dL (ref 12.0–15.0)
LYMPHS ABS: 2 10*3/uL (ref 0.7–4.0)
Lymphocytes Relative: 14 %
MCH: 27.9 pg (ref 26.0–34.0)
MCHC: 32.2 g/dL (ref 30.0–36.0)
MCV: 86.6 fL (ref 78.0–100.0)
MONO ABS: 1.8 10*3/uL — AB (ref 0.1–1.0)
MONOS PCT: 13 %
NEUTROS ABS: 10.4 10*3/uL — AB (ref 1.7–7.7)
Neutrophils Relative %: 73 %
Platelets: 257 10*3/uL (ref 150–400)
RBC: 4.77 MIL/uL (ref 3.87–5.11)
RDW: 14.1 % (ref 11.5–15.5)
WBC: 14.2 10*3/uL — ABNORMAL HIGH (ref 4.0–10.5)

## 2017-06-20 LAB — GLUCOSE, CAPILLARY
GLUCOSE-CAPILLARY: 211 mg/dL — AB (ref 65–99)
GLUCOSE-CAPILLARY: 263 mg/dL — AB (ref 65–99)
Glucose-Capillary: 151 mg/dL — ABNORMAL HIGH (ref 65–99)
Glucose-Capillary: 115 mg/dL — ABNORMAL HIGH (ref 65–99)

## 2017-06-20 LAB — VITAMIN D 25 HYDROXY (VIT D DEFICIENCY, FRACTURES): VIT D 25 HYDROXY: 66.8 ng/mL (ref 30.0–100.0)

## 2017-06-20 MED ORDER — CLOPIDOGREL BISULFATE 75 MG PO TABS: 75.0000 mg | ORAL_TABLET | Freq: Every day | ORAL | Status: AC

## 2017-06-20 MED ORDER — CLOPIDOGREL BISULFATE 75 MG PO TABS
75.0000 mg | ORAL_TABLET | Freq: Every day | ORAL | Status: DC
  Administered 2017-06-21 – 2017-07-14 (×24): 75 mg via ORAL
  Filled 2017-06-20 (×24): qty 1

## 2017-06-20 MED ORDER — METFORMIN HCL ER 500 MG PO TB24: 500.0000 mg | ORAL_TABLET | Freq: Two times a day (BID) | ORAL | 11 refills | Status: DC

## 2017-06-20 MED ORDER — LANTUS 100 UNIT/ML ~~LOC~~ SOLN: 25.0000 [IU] | Freq: Every day | SUBCUTANEOUS | 0 refills | Status: DC

## 2017-06-20 MED ORDER — METOPROLOL SUCCINATE ER 50 MG PO TB24: 50.0000 mg | ORAL_TABLET | Freq: Every day | ORAL | Status: DC

## 2017-06-20 MED ORDER — PROCHLORPERAZINE 25 MG RE SUPP: 12.5000 mg | Freq: Four times a day (QID) | RECTAL | Status: DC | PRN

## 2017-06-20 MED ORDER — INSULIN ASPART 100 UNIT/ML ~~LOC~~ SOLN
8.0000 [IU] | Freq: Three times a day (TID) | SUBCUTANEOUS | Status: DC
  Administered 2017-06-22 – 2017-07-14 (×31): 8 [IU] via SUBCUTANEOUS

## 2017-06-20 MED ORDER — ACETAMINOPHEN 325 MG PO TABS: 650.0000 mg | ORAL_TABLET | ORAL | Status: DC | PRN

## 2017-06-20 MED ORDER — PROCHLORPERAZINE MALEATE 5 MG PO TABS: 5.0000 mg | ORAL_TABLET | Freq: Four times a day (QID) | ORAL | Status: DC | PRN

## 2017-06-20 MED ORDER — CITALOPRAM HYDROBROMIDE 10 MG PO TABS
10.0000 mg | ORAL_TABLET | Freq: Every day | ORAL | Status: DC
  Administered 2017-06-21 – 2017-07-14 (×24): 10 mg via ORAL
  Filled 2017-06-20 (×24): qty 1

## 2017-06-20 MED ORDER — DIPHENHYDRAMINE HCL 12.5 MG/5ML PO ELIX: 12.5000 mg | ORAL_SOLUTION | Freq: Four times a day (QID) | ORAL | Status: DC | PRN

## 2017-06-20 MED ORDER — TRAZODONE HCL 50 MG PO TABS
25.0000 mg | ORAL_TABLET | Freq: Every evening | ORAL | Status: DC | PRN
  Administered 2017-06-28 – 2017-07-13 (×6): 50 mg via ORAL
  Filled 2017-06-20 (×6): qty 1

## 2017-06-20 MED ORDER — ATORVASTATIN CALCIUM 40 MG PO TABS
40.0000 mg | ORAL_TABLET | Freq: Every day | ORAL | Status: DC
  Administered 2017-06-20 – 2017-07-13 (×24): 40 mg via ORAL
  Filled 2017-06-20 (×23): qty 1

## 2017-06-20 MED ORDER — VITAMIN B-12 1000 MCG PO TABS
1000.0000 ug | ORAL_TABLET | Freq: Every day | ORAL | Status: DC
  Administered 2017-06-21 – 2017-07-14 (×24): 1000 ug via ORAL
  Filled 2017-06-20 (×26): qty 1

## 2017-06-20 MED ORDER — ENOXAPARIN SODIUM 40 MG/0.4ML ~~LOC~~ SOLN
40.0000 mg | SUBCUTANEOUS | Status: DC
  Administered 2017-06-20 – 2017-07-13 (×24): 40 mg via SUBCUTANEOUS
  Filled 2017-06-20 (×24): qty 0.4

## 2017-06-20 MED ORDER — ASPIRIN 300 MG RE SUPP: 300.0000 mg | Freq: Every day | RECTAL | Status: DC

## 2017-06-20 MED ORDER — INSULIN ASPART 100 UNIT/ML ~~LOC~~ SOLN
0.0000 [IU] | Freq: Every day | SUBCUTANEOUS | Status: DC
  Administered 2017-06-21: 2 [IU] via SUBCUTANEOUS
  Administered 2017-06-22: 3 [IU] via SUBCUTANEOUS
  Administered 2017-06-25: 2 [IU] via SUBCUTANEOUS

## 2017-06-20 MED ORDER — AMLODIPINE BESYLATE 5 MG PO TABS: 5.0000 mg | ORAL_TABLET | Freq: Every day | ORAL | Status: DC

## 2017-06-20 MED ORDER — SENNOSIDES-DOCUSATE SODIUM 8.6-50 MG PO TABS
2.0000 | ORAL_TABLET | Freq: Every day | ORAL | Status: DC
  Administered 2017-06-20 – 2017-07-07 (×18): 2 via ORAL
  Filled 2017-06-20 (×21): qty 2

## 2017-06-20 MED ORDER — INSULIN ASPART 100 UNIT/ML ~~LOC~~ SOLN
0.0000 [IU] | Freq: Three times a day (TID) | SUBCUTANEOUS | Status: DC
  Administered 2017-06-20: 3 [IU] via SUBCUTANEOUS
  Administered 2017-06-21: 8 [IU] via SUBCUTANEOUS
  Administered 2017-06-21: 3 [IU] via SUBCUTANEOUS
  Administered 2017-06-21: 8 [IU] via SUBCUTANEOUS
  Administered 2017-06-22: 3 [IU] via SUBCUTANEOUS
  Administered 2017-06-22: 5 [IU] via SUBCUTANEOUS
  Administered 2017-06-22: 8 [IU] via SUBCUTANEOUS
  Administered 2017-06-23: 3 [IU] via SUBCUTANEOUS
  Administered 2017-06-23: 5 [IU] via SUBCUTANEOUS
  Administered 2017-06-23: 8 [IU] via SUBCUTANEOUS
  Administered 2017-06-24: 2 [IU] via SUBCUTANEOUS
  Administered 2017-06-24: 15 [IU] via SUBCUTANEOUS
  Administered 2017-06-24: 8 [IU] via SUBCUTANEOUS
  Administered 2017-06-25 (×2): 11 [IU] via SUBCUTANEOUS
  Administered 2017-06-26: 2 [IU] via SUBCUTANEOUS
  Administered 2017-06-27: 1 [IU] via SUBCUTANEOUS
  Administered 2017-06-27 (×2): 8 [IU] via SUBCUTANEOUS
  Administered 2017-06-28: 3 [IU] via SUBCUTANEOUS
  Administered 2017-06-29: 2 [IU] via SUBCUTANEOUS
  Administered 2017-06-30: 3 [IU] via SUBCUTANEOUS
  Administered 2017-07-02: 8 [IU] via SUBCUTANEOUS
  Administered 2017-07-02 – 2017-07-03 (×2): 2 [IU] via SUBCUTANEOUS
  Administered 2017-07-04 – 2017-07-06 (×6): 3 [IU] via SUBCUTANEOUS
  Administered 2017-07-07: 5 [IU] via SUBCUTANEOUS
  Administered 2017-07-07 – 2017-07-08 (×2): 3 [IU] via SUBCUTANEOUS
  Administered 2017-07-08 – 2017-07-09 (×2): 2 [IU] via SUBCUTANEOUS
  Administered 2017-07-10: 3 [IU] via SUBCUTANEOUS
  Administered 2017-07-10: 5 [IU] via SUBCUTANEOUS
  Administered 2017-07-11 – 2017-07-13 (×3): 3 [IU] via SUBCUTANEOUS
  Administered 2017-07-13 – 2017-07-14 (×2): 2 [IU] via SUBCUTANEOUS

## 2017-06-20 MED ORDER — INSULIN GLARGINE 100 UNIT/ML ~~LOC~~ SOLN
24.0000 [IU] | Freq: Every day | SUBCUTANEOUS | Status: DC
  Administered 2017-06-21 – 2017-06-22 (×2): 24 [IU] via SUBCUTANEOUS
  Filled 2017-06-20 (×2): qty 0.24

## 2017-06-20 MED ORDER — ACETAMINOPHEN 325 MG PO TABS
325.0000 mg | ORAL_TABLET | ORAL | Status: DC | PRN
  Administered 2017-06-22 – 2017-07-11 (×6): 650 mg via ORAL
  Filled 2017-06-20 (×5): qty 2

## 2017-06-20 MED ORDER — ALUM & MAG HYDROXIDE-SIMETH 200-200-20 MG/5ML PO SUSP: 30.0000 mL | ORAL | Status: DC | PRN

## 2017-06-20 MED ORDER — ASPIRIN 325 MG PO TABS
325.0000 mg | ORAL_TABLET | Freq: Every day | ORAL | Status: DC
  Administered 2017-06-21: 325 mg via ORAL
  Filled 2017-06-20: qty 1

## 2017-06-20 MED ORDER — FLEET ENEMA 7-19 GM/118ML RE ENEM
1.0000 | ENEMA | Freq: Once | RECTAL | Status: DC | PRN
  Filled 2017-06-20: qty 1

## 2017-06-20 MED ORDER — GUAIFENESIN-DM 100-10 MG/5ML PO SYRP: 5.0000 mL | ORAL_SOLUTION | Freq: Four times a day (QID) | ORAL | Status: DC | PRN

## 2017-06-20 MED ORDER — SODIUM CHLORIDE 0.9 % IV SOLN
3.0000 g | Freq: Four times a day (QID) | INTRAVENOUS | Status: DC
  Administered 2017-06-20 – 2017-06-22 (×8): 3 g via INTRAVENOUS
  Filled 2017-06-20 (×10): qty 3

## 2017-06-20 MED ORDER — INSULIN GLARGINE 100 UNIT/ML ~~LOC~~ SOLN
24.0000 [IU] | Freq: Every day | SUBCUTANEOUS | Status: DC
  Administered 2017-06-20: 24 [IU] via SUBCUTANEOUS
  Filled 2017-06-20 (×2): qty 0.24

## 2017-06-20 MED ORDER — INSULIN ASPART 100 UNIT/ML ~~LOC~~ SOLN: 6.0000 [IU] | Freq: Three times a day (TID) | SUBCUTANEOUS | 11 refills | Status: DC

## 2017-06-20 MED ORDER — ASPIRIN EC 81 MG PO TBEC: 81.0000 mg | DELAYED_RELEASE_TABLET | Freq: Every day | ORAL | 0 refills | Status: DC

## 2017-06-20 MED ORDER — LOSARTAN POTASSIUM 50 MG PO TABS: 50.0000 mg | ORAL_TABLET | Freq: Every day | ORAL | Status: DC

## 2017-06-20 MED ORDER — ATORVASTATIN CALCIUM 40 MG PO TABS: 40.0000 mg | ORAL_TABLET | Freq: Every day | ORAL | Status: AC

## 2017-06-20 MED ORDER — PROCHLORPERAZINE EDISYLATE 5 MG/ML IJ SOLN: 5.0000 mg | Freq: Four times a day (QID) | INTRAMUSCULAR | Status: DC | PRN

## 2017-06-20 MED ORDER — AMOXICILLIN-POT CLAVULANATE 875-125 MG PO TABS: 1.0000 | ORAL_TABLET | Freq: Two times a day (BID) | ORAL | 0 refills | Status: DC

## 2017-06-20 MED ORDER — BISACODYL 10 MG RE SUPP
10.0000 mg | Freq: Every day | RECTAL | Status: DC | PRN
  Administered 2017-06-23: 10 mg via RECTAL
  Filled 2017-06-20: qty 1

## 2017-06-20 MED ORDER — POLYETHYLENE GLYCOL 3350 17 GM/SCOOP PO POWD: 17.0000 g | Freq: Every day | ORAL | 0 refills | Status: DC

## 2017-06-20 NOTE — Progress Notes (Signed)
Modified Barium Swallow Progress Note  Patient Details  Name: Sandra [name] MRN: [id number] Date of Birth: [birth year]  Today's Date: 06/20/2017  Modified Barium Swallow completed.  Full report located under Chart Review in the Imaging Section.  Brief recommendations include the following:  Clinical Impression  Pt presents with moderate/severe oral phase and mild/mod pharyngeal phase dysphagia characterized by right buccal and labial weakness resulting in decreased labial closure, decreased bolus cohesiveness, reduced anterior-posterior transit, impaired mastication and bolus manipulation, prolonged oral phase, premature spillage with liquids with swallow initiation at the level of the valleculae and spilling to pyriforms at times; Pt with trace penetration of thins during the swallow without frank aspiration observed. Pt also noted to have min vallecular residue and mild/mod right pyriform residue with most textures and consistencies. Pt unable to follow directions for compensatory strategies at this time due to severity of cognitive linguistic deficits (head turn, repeat swallows, etc), however dry spoon presentation to oral cavity does help elicit dry swallow which is effective in clearing most but not all pharyngeal residue. Liquids were presented via tsp, cup, and straw due to significant right labial spillage. Pt unable to masticate graham cracker efficiently and was given applesauce to help, which resulted in eventual swallow but continued oral residue. Recommend continuing D1/puree with NTL via tsp, cup, straw with assist and pills whole in puree and follow with liquid wash. Pt could begin trials of upgraded textures and thin water when working 1:1 with SLP clinically. Pt will likely need a repeat objective assessment as clinical picture improves. Above discussed with RN and Pt's son [name], PT).    Swallow Evaluation Recommendations       SLP Diet Recommendations: Dysphagia 1 (Puree)  solids;Nectar thick liquid   Liquid Administration via: Spoon;Cup;Straw   Medication Administration: Whole meds with puree   Supervision: Full assist for feeding;Full supervision/cueing for compensatory strategies   Compensations: Minimize environmental distractions;Slow rate;Small sips/bites;Lingual sweep for clearance of pocketing   Postural Changes: Remain semi-upright after after feeds/meals (Comment);Seated upright at 90 degrees   Oral Care Recommendations: Oral care before and after PO;Staff/trained caregiver to provide oral care   Other Recommendations: Clarify dietary restrictions   Thank you,  Genene Churn, [place]  Franki Alcaide 06/20/2017,1:51 PM

## 2017-06-20 NOTE — Clinical Social Work Note (Signed)
Patient accepted at [facility].  LCSW signing off.     Kenyon Eichelberger, Clydene Pugh, LCSW

## 2017-06-20 NOTE — Progress Notes (Signed)
PROGRESS NOTE   Sandra [name]  EHM:[id number]  DOB: [birth year]  DOA: 06/15/2017 PCP: Caren Macadam, MD  Brief Admission Hx: 77 y.o. female, w hypertension, hyperlipidemia, Dm2, CVA,  apparently not taking bp medication for the past 2 weeks presents with dysarthria.  Pt is not taking aspirin at home. Per teleneurology last known well 1200 on 06/14/2017.  Pt has difficulty with comprehension as well it appears.  The patient was noted to have an acute CVA by MRI study.  MDM/Assessment & Plan:   1. Moderate size left MCA stroke- neurologic consultation recommending dual antiplatelet therapy x 3 months, atorvastatin 40 mg daily, hemoglobin A1c 8.2%, carotid ultrasound studies no significant stenosis, echocardiogram below, PT/OT recommending CIR/SNF.  Pt accepted for CIR hopefully bed will be available on Monday.  SLP following.  Pt only minimal improvement with speech.  Pt had acute change in functional status 2/1, I spoke with neurologist Dr. Merlene Laughter and he recommended getting CT head to evaluate for hemorrhagic transformation.  The CT did not show hemorrhagic transformation.  2. Dysphagia - SLP is planning modified barium study tomorrow.   3. Fever - work up including CXR, UA, blood cultures.  4. Aspiration pneumonia - IV unasyn started, follow blood cultures, supportive therapy.  5. Dyslipidemia-uncontrolled as evidenced by an LDL cholesterol of 150.  I have started the patient on atorvastatin 40 mg daily. 6. Constipation - BM x 1 reported,  stool softeners ordered, give suppository.   7. Uncontrolled hypertension- given the acute CVA will allow for permissive hypertension and therefore holding home blood pressure medication at this time. 8. Aphasia-Pt will need ongoing SLP therapy and hopefully CIR services. 9. Type 2 diabetes mellitus uncontrolled-carbohydrate modified diet, sliding scale coverage as needed.  A1c 8.2% which means poor control of disease.  Added lantus and prandial novolog  and sliding scale insulin coverage.   DVT prophylaxis: SCDs Code Status: Full Family Communication: spoke with son Disposition Plan: CIR when bed available  Consultants:  Neurology  Procedures:  Echocardiogram: Study Conclusions - Left ventricle: The cavity size was normal. Wall thickness was   increased in a pattern of mild LVH. Systolic function was normal.   The estimated ejection fraction was in the range of 60% to 65%.   Wall motion was normal; there were no regional wall motion   abnormalities. Doppler parameters are consistent with abnormal   left ventricular relaxation (grade 1 diastolic dysfunction). - Aortic valve: Mildly calcified annulus. Trileaflet. - Mitral valve: Mildly calcified annulus. There was trivial   regurgitation. - Right atrium: Central venous pressure (est): 3 mm Hg. - Atrial septum: No defect or patent foramen ovale was identified. - Pulmonary arteries: Systolic pressure could not be accurately   estimated. - Pericardium, extracardiac: There was no pericardial effusion.  Carotid Dopplers IMPRESSION: No significant carotid stenosis identified in the neck bilaterally. Estimated bilateral ICA stenoses are less than 50%. Minimal to mild plaque present, as above.  Subjective: The patient remains aphasic but tries to communicate more, pt had a bowel movement  Objective: Vitals:   06/20/17 0030 06/20/17 0430 06/20/17 0741 06/20/17 0830  BP: (!) 155/58 (!) 161/67  (!) 156/90  Pulse: 72 86  81  Resp: 20 20  16   Temp: 98.6 F (37 C) 97.7 F (36.5 C) 99.1 F (37.3 C) 99.2 F (37.3 C)  TempSrc: Oral Oral Oral Oral  SpO2: 95% 98%  91%  Weight:      Height:        Intake/Output Summary (  Last 24 hours) at 06/20/2017 1005 Last data filed at 06/20/2017 0600 Gross per 24 hour  Intake 1140 ml  Output -  Net 1140 ml   Filed Weights   06/15/17 1417 06/15/17 1942  Weight: 89.8 kg (198 lb) 89.7 kg (197 lb 12 oz)   REVIEW OF SYSTEMS  As per  history otherwise all reviewed and reported negative  Exam:  General exam: Lying in the bed, awake, alert, no apparent distress, following simple commands.  Remains aphasic. Respiratory system: Clear. No increased work of breathing. Cardiovascular system: S1 & S2 heard. No JVD, murmurs, gallops, clicks or pedal edema. Gastrointestinal system: Abdomen is nondistended, soft and nontender. Normal bowel sounds heard. Central nervous system: Alert.  Pronounced right hemiparesis involving the right upper extremity, able to move the right lower extremity.  Normal gaze tracking.  Speech, language and comprehension deficits noted. Extremities: no CCE.  Data Reviewed: Basic Metabolic Panel: Recent Labs  Lab 06/15/17 1430 06/15/17 1441 06/16/17 0632 06/19/17 0805 06/20/17 0528  NA 141 141 140 137 141  K 3.7 3.5 3.7 4.0 3.6  CL 100* 100* 101 97* 99*  CO2 30  --  31 28 29   GLUCOSE 144* 140* 127* 365* 224*  BUN 17 17 13  27* 32*  CREATININE 0.78 0.80 0.57 0.97 0.86  CALCIUM 9.1  --  8.8* 9.1 9.0   Liver Function Tests: Recent Labs  Lab 06/15/17 1430 06/16/17 0632 06/19/17 0805 06/20/17 0528  AST 22 20 25 22   ALT 17 14 15  13*  ALKPHOS 85 71 77 68  BILITOT 0.7 0.8 1.2 0.5  PROT 7.2 6.4* 7.2 6.6  ALBUMIN 3.6 3.1* 3.2* 2.8*   No results for input(s): LIPASE, AMYLASE in the last 168 hours. No results for input(s): AMMONIA in the last 168 hours. CBC: Recent Labs  Lab 06/15/17 1430 06/15/17 1441 06/19/17 0805 06/20/17 0528  WBC 10.1  --  15.1* 14.2*  NEUTROABS 6.1  --  11.4* 10.4*  HGB 14.3 15.6* 14.0 13.3  HCT 44.3 46.0 43.0 41.3  MCV 87.2  --  86.3 86.6  PLT 264  --  248 257   Cardiac Enzymes: No results for input(s): CKTOTAL, CKMB, CKMBINDEX, TROPONINI in the last 168 hours. CBG (last 3)  Recent Labs    06/19/17 1620 06/19/17 2146 06/20/17 0740  GLUCAP 308* 259* 211*   Recent Results (from the past 240 hour(s))  Culture, blood (Routine X 2) w Reflex to ID Panel      Status: None (Preliminary result)   Collection Time: 06/19/17  8:14 AM  Result Value Ref Range Status   Specimen Description BLOOD LEFT HAND  Final   Special Requests   Final    BOTTLES DRAWN AEROBIC AND ANAEROBIC Blood Culture adequate volume   Culture   Final    NO GROWTH < 24 HOURS Performed at [facility], [address] [place], [place] [address]    Report Status PENDING  Incomplete  Culture, blood (Routine X 2) w Reflex to ID Panel     Status: None (Preliminary result)   Collection Time: 06/19/17  8:15 AM  Result Value Ref Range Status   Specimen Description RIGHT ANTECUBITAL  Final   Special Requests   Final    BOTTLES DRAWN AEROBIC AND ANAEROBIC Blood Culture adequate volume   Culture   Final    NO GROWTH < 24 HOURS Performed at [facility], [address], [place], [place] [address]    Report Status PENDING  Incomplete  Studies: Dg Chest Port 1 View  Result Date: 06/19/2017 CLINICAL DATA:  Fever EXAM: PORTABLE CHEST 1 VIEW COMPARISON:  None. FINDINGS: There is mild bilateral interstitial thickening. There is no focal consolidation. There is no pleural effusion or pneumothorax. The heart and mediastinal contours are unremarkable. The osseous structures are unremarkable. IMPRESSION: Bilateral interstitial thickening which may reflect interstitial infection versus mild interstitial edema. Electronically Signed   By: Kathreen Devoid   On: 06/19/2017 10:37   Scheduled Meds: . aspirin  300 mg Rectal Daily   Or  . aspirin  325 mg Oral Daily  . atorvastatin  40 mg Oral q1800  . citalopram  10 mg Oral Daily  . clopidogrel  75 mg Oral Q breakfast  . insulin aspart  0-15 Units Subcutaneous TID WC  . insulin aspart  0-5 Units Subcutaneous QHS  . insulin aspart  8 Units Subcutaneous TID WC  . insulin glargine  24 Units Subcutaneous Daily  . senna-docusate  2 tablet Oral QHS  . vitamin B-12  1,000 mcg Oral Daily   Continuous Infusions: . ampicillin-sulbactam (UNASYN) IV Stopped  (06/20/17 0453)    Principal Problem:   Stroke ([place]) Active Problems:   Essential hypertension   Type 2 diabetes mellitus with neurological complications ([place])   Hyperlipidemia associated with type 2 diabetes mellitus ([place])  Time spent:   Irwin Brakeman, MD, FAAFP [facility] Pager [phone]  If 7PM-7AM, please contact night-coverage [email or web address] Password [email or web address] 06/20/2017, 10:05 AM    LOS: 5 days

## 2017-06-20 NOTE — Care Management (Addendum)
Patient has been accepted to [facility].   She is going to have swallow eval now.  MD notified to do DC summary and patient will transfer via [facility].  RN updated.  Unit Secretary will arrange [facility] transport.  Accepting MD : Dr. Delice Lesch. Room # [phone]

## 2017-06-20 NOTE — Progress Notes (Signed)
Report called and given to RN on the inpatient rehab floor at [facility].

## 2017-06-20 NOTE — PMR Pre-admission (Signed)
[facility] PMR Admission Coordinator Pre-Admission Assessment  Patient: Sandra [name] is an 77 y.o., female MRN: [id number] DOB: 1940-05-27 Height: 5\' 5"  (165.1 cm) Weight: 89.7 kg (197 lb 12 oz)  Insurance Information HMO: No    PPO:       PCP:       IPA:       80/20:       OTHER:   PRIMARY:  Medicare A and B      Policy#: [id number]      Subscriber: Sandra [name] CM Name:        Phone#:       Fax#:   Pre-Cert#:        Employer:  Retired Benefits:  Phone #:       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 Date: 11/15/03     Deduct: $1364      Out of Pocket Max: None      Life Max: N/A CIR: 100%      SNF: 100 days Outpatient: 80%     Co-Pay: 20% Home Health: 100%      Co-Pay: none DME: 80%     Co-Pay: 20% Providers: patient's choice  SECONDARY: Tricare for Life      Policy#: [id number]      Subscriber: patient CM Name:        Phone#:       Fax#:   Pre-Cert#:        Employer:  Retired Benefits:  Phone #: [phone]     Name:   Eff. Date:       Deduct:        Out of Pocket Max:        Life Max:   CIR:        SNF:   Outpatient:       Co-Pay:   Home Health:        Co-Pay:   DME:       Co-Pay:    Emergency Contact Information Contact Information    Name Relation Home Work [place] Relative [phone]     Sandra [name] [phone]  [phone]   Sandra [name]   [phone]      Current Medical History  Patient Admitting Diagnosis:  L MCA stroke  History of Present Illness: A35 y.o.female with history of HTN, T2DM, diverticulosis, recent issues with depression and had stopped taking all medication except insulin for few weeks. She was admitted to [facility] on 06/15/17 with elevated BP, confusion and 2 week history of dysarthria. CTA head done revealing acute to subacute left frontoparietal MCA territory infarct with left M2 occlusion and advanced intracranial atherosclerosis with scattered severe stenosis in anterior and posterior  circulation.  MRI/MRA brain done revealing moderate size L-MCA infarct affecting posterior lateral frontal lobe and multiple missing L-MCA branch vessels and moderately advanced chronic SVD.  2D echo showed EF 60-65% with no wall abnormality and mildly calcified MV and AV. Carotid dopplers with < 50% ICA stenosis. Swallow evaluation done and patient placed on dysphagia 1, nectar liquids with water protocol.  She had acute changes in mental status with lethargy, minimal verbal output and CT head repeated 02/1 and was negative for bleed but she developed fever 101.4 with and leucocytosis on early am of 2/2.  Blood cultures done for work up and she was started on Unasyn due to concerns of aspiration PNA. She has had increase in right facial weakness and RUE weakness and MBS repeated 02/4 due  to functional decline with inability to elicit cough.    Patient with resultant dense right hemiparesis, expressive/receptive aphasia, dysphagia and MD and Rehab CM reporting that patient much improved this am and she was cleared medically to start CIR program.    Patient's medical record from  [facility] has been reviewed by the rehabilitation admission coordinator and physician.  NIH Stroke scale: 15  Past Medical History  Past Medical History:  Diagnosis Date  . Colon polyp   . Constipation   . Diabetes mellitus without complication ([place])   . GI bleed   . Hemorrhoids   . High cholesterol   . Hypertension   . PAD (peripheral artery disease) ([place])   . Peripheral neuropathy   . Rectal bleeding    "for 7 years"  . Rectal bleeding   . Stroke [facility])     Family History   family history includes Cancer in her father; Diabetes in her brother, daughter, daughter, maternal grandmother, mother, and sister.  Prior Rehab/Hospitalizations Has the patient had major surgery during 100 days prior to admission? No   Current Medications  See MAR from [facility]  Patients Current Diet:   Dys 1,  nectar thick liquids  Precautions / Restrictions Precautions Precautions: Fall Precaution Comments: truncal ataxia, gross coordination deficits Restrictions Weight Bearing Restrictions: No Other Position/Activity Restrictions: Positioning of right upper extremity in sight of patient and not pulling on it as it is flaccid.    Has the patient had 2 or more falls or a fall with injury in the past year?No  Prior Activity Level Limited Community (1-2x/wk): Went out 2-3 X a week for appointments, out to eat.  Not driving.  Prior Functional Level Self Care: Did the patient need help bathing, dressing, using the toilet or eating?  Independent  Indoor Mobility: Did the patient need assistance with walking from room to room (with or without device)? Independent  Stairs: Did the patient need assistance with internal or external stairs (with or without device)? Independent  Functional Cognition: Did the patient need help planning regular tasks such as shopping or remembering to take medications? Independent  Home Assistive Devices / Equipment Home Assistive Devices/Equipment: Cane (specify quad or straight) Home Equipment: Cane - single point, Grab bars - tub/shower  Prior Device Use: Indicate devices/aids used by the patient prior to current illness, exacerbation or injury? Cane for longer distance.   Prior Functional Level Current Functional Level  Bed Mobility  Independent  Mod assist   Transfers  Independent  Max assist   Mobility - Walk/Wheelchair  Independent  Total assist   Upper Body Dressing  Independent  Mod assist   Lower Body Dressing  Independent  Max assist   Grooming  Independent  Other(Set up)   Eating/Drinking  Independent  Min assist   Toilet Transfer  Independent  Total assist   Bladder Continence   Wears depends  Incontinent, wears depends at baseline   Bowel Management  WDL  Last BM 06/18/17 with incontinence   Stair Climbing   Independent Other(Unable)   Communication  Verbal, intact  Aphasia, impaired   Memory  WDL  Impaired   Cooking/Meal Prep  Independent      Housework  Independent    Money Management  Independent    Driving  Not driving     Special needs/care consideration BiPAP/CPAP No CPM No Continuous Drip IV KVO with IV antibiotic  Dialysis No      Life Vest No Oxygen No  Special Bed No Trach Size No Wound Vac (area) No     Skin Gets a rash at times                           Bowel mgmt: Last BM 06/18/17 with incontinence Bladder mgmt: Wears depends at home, incontinence Diabetic mgmt Yes, on insulin at home.  HgbA1C is 8.2  Previous Home Environment Living Arrangements: Children(son, dtr-in-law, 2 grandchildren)  Lives With: Family, Son Available Help at Discharge: Family Type of Home: House Home Layout: Two level, Laundry or work area in basement, Able to live on main level with bedroom/bathroom Alternate Level Stairs-Number of Steps: 20 steps to second level; 12 to basement Home Access: Stairs to enter Entrance Stairs-Rails: None Technical brewer of Steps: 4 Bathroom Shower/Tub: Chiropodist: [place]: No  Discharge Living Setting Plans for Discharge Living Setting: House, Lives with (comment)(Lives with son and dtr in law.) Type of Home at Discharge: House Discharge Home Layout: Multi-level, Laundry or work area in basement, [employer] on main level, Bed/bath upstairs, Able to live on main level with bedroom/bathroom Alternate Level Stairs-Number of Steps: 20 steps to 2nd level and 12 steps down to basement Discharge Home Access: Stairs to enter Entrance Stairs-Number of Steps: 4 Does the patient have any problems obtaining your medications?: No  Social/Family/Support Systems Patient Roles: Parent, Other (Comment)(Has a son, dtr-in-law, grandchildren.) Contact Information: Juanell Saffo - son - [phone] Anticipated  Caregiver: [name] - dtr-in-law Anticipated Caregiver's Contact Information: Loleta Dicker in law - [phone] Ability/Limitations of Caregiver: Dtr in law stays at home and can assist Caregiver Availability: 24/7 Discharge Plan Discussed with Primary Caregiver: Yes Is Caregiver In Agreement with Plan?: Yes Does Caregiver/Family have Issues with Lodging/Transportation while Pt is in Rehab?: No  Goals/Additional Needs Patient/Family Goal for Rehab: PT supervision to min assist, OT supervision to min assist, SLP supervision to min assist goals Expected length of stay: 14-18 days Cultural Considerations: [place] Dietary Needs: Dys 1, nectar thick liquids Equipment Needs: TBD Pt/Family Agrees to Admission and willing to participate: Yes Program Orientation Provided & Reviewed with Pt/Caregiver Including Roles  & Responsibilities: Yes  Patient Condition: I have reviewed the patient's chart and I have talked with patient's son.  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  Patient has been evaluated by PT/OT and SLP with ongoing therapies and recommendations for acute inpatient rehab admission.  Patient will benefit from 3 hours of therapy a day and from the coordinated approach of the rehab team.  Goals are to get patient back home with family after rehab discharge.  I have discussed all findings with rehab MD and I have approval for acute inpatient rehab admission for today.  Preadmission Screen Completed By:  Retta Diones, 06/20/2017 12:12 PM ______________________________________________________________________   Discussed status with Dr. Posey Pronto on 06/20/17 at 1216 and received telephone approval for admission today.  Admission Coordinator:  Retta Diones, time 1228/Date 06/20/17   Assessment/Plan: Diagnosis: L MCA stroke  1. Does the need for close, 24 hr/day  Medical supervision in concert with the patient's rehab needs make it unreasonable for this patient to be  served in a less intensive setting? Yes  2. Co-Morbidities requiring supervision/potential complications: HTN, N4OE, diverticulosis, recent issues with depression and had stopped taking all medication except insulin for few weeks. 3. Due to bladder management, safety, disease management, medication administration and patient education, does the  patient require 24 hr/day rehab nursing? Yes 4. Does the patient require coordinated care of a physician, rehab nurse, PT (1-2 hrs/day, 5 days/week), OT (1-2 hrs/day, 5 days/week) and SLP (1-2 hrs/day, 5 days/week) to address physical and functional deficits in the context of the above medical diagnosis(es)? Yes Addressing deficits in the following areas: balance, endurance, locomotion, strength, transferring, bathing, dressing, grooming, toileting, cognition, speech, swallowing and psychosocial support 5. Can the patient actively participate in an intensive therapy program of at least 3 hrs of therapy 5 days a week? Potentially 6. The potential for patient to make measurable gains while on inpatient rehab is excellent 7. Anticipated functional outcomes upon discharge from inpatients are: min assist and mod assist PT, min assist and mod assist OT, supervision and min assist SLP 8. Estimated rehab length of stay to reach the above functional goals is: 19-23 days. 9. Does the patient have adequate social supports to accommodate these discharge functional goals? Potentially 10. Anticipated D/C setting: Home 11. Anticipated post D/C treatments: HH therapy and Home excercise program 12. Overall Rehab/Functional Prognosis: good    RECOMMENDATIONS: This patient's condition is appropriate for continued rehabilitative care in the following setting: CIR Patient has agreed to participate in recommended program. Yes Note that insurance prior authorization may be required for reimbursement for recommended care.  Delice Lesch, MD, ABPMR Jodell Cipro Jerilynn Mages 06/20/2017

## 2017-06-20 NOTE — Progress Notes (Signed)
[facility] admissions - I spoke with patient's son, [name], this am.  He would like inpatient rehab admission.  Patient's daughter-in-law can assist while [name] is working after rehab discharge.  Will check with MD for medical readiness.  I do have an available bed on rehab today.  Call me for questions.  #[phone]

## 2017-06-20 NOTE — Discharge Instructions (Signed)
INSTRUCTIONS:  TAKE ASPIRIN AND PLAVIX TOGETHER FOR 3 MONTHS, THEN TAKE PLAVIX ALONE AFTER THAT.   CHECK BLOOD SUGARS 4 TIMES PER DAY.    CONTINUE ANTIBIOTICS FOR 6 DAYS THEN STOP.  REPEAT CHEST XRAY IN 1 MONTH.

## 2017-06-20 NOTE — Progress Notes (Signed)
Jamse Arn, MD  Physician  Physical Medicine and Rehabilitation  PMR Pre-admission  Signed  Date of Service:  06/20/2017 11:59 AM       Related encounter: ED to Hosp-Admission (Discharged) from 06/15/2017 in [place]            [] Hide copied text  [] Hover for details     [employer] PMR Admission Coordinator Pre-Admission Assessment  Patient: Sandra [name] is an 77 y.o., female MRN: [id number] DOB: 1940/06/13 Height: 5\' 5"  (165.1 cm) Weight: 89.7 kg (197 lb 12 oz)  Insurance Information HMO: No    PPO:       PCP:       IPA:       80/20:       OTHER:   PRIMARY:  Medicare A and B      Policy#: [id number]      Subscriber: [name] CM Name:        Phone#:       Fax#:   Pre-Cert#:        Employer:  Retired Benefits:  Phone #:       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 Date: 11/15/03     Deduct: $1364      Out of Pocket Max: None      Life Max: N/A CIR: 100%      SNF: 100 days Outpatient: 80%     Co-Pay: 20% Home Health: 100%      Co-Pay: none DME: 80%     Co-Pay: 20% Providers: patient's choice  SECONDARY: Tricare for Life      Policy#: [id number]      Subscriber: patient CM Name:        Phone#:       Fax#:   Pre-Cert#:        Employer:  Retired Benefits:  Phone #: [phone]     Name:   Eff. Date:       Deduct:        Out of Pocket Max:        Life Max:   CIR:        SNF:   Outpatient:       Co-Pay:   Home Health:        Co-Pay:   DME:       Co-Pay:    Emergency Contact Information        Contact Information    Name Relation Home Work [place] Relative [phone]     Vesta Mixer [phone]  [phone]   Marge, Vandermeulen   [phone]      Current Medical History  Patient Admitting Diagnosis:  L MCA stroke  History of Present Illness: A36 y.o.femalewith history of HTN, T2DM, diverticulosis,recent issues with depression and had stopped  taking allmedicationexcept insulin for few weeks. She wasadmitted to [facility] on [facility]/15/17 with elevated BP, confusion and 2 week history of dysarthria. 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 MRI/MRA brain done revealing moderate size L-MCA infarct affecting posterior lateral frontal lobe and multiple missing L-MCA branch vessels and moderately advanced chronic SVD. 2D echo showed EF 60-65% with no wall abnormality and mildly calcified MV and AV. Carotid dopplers with <50% ICA stenosis. Swallow evaluation done and patient placed on dysphagia 1, nectar liquids with water protocol.  She had acute changes in mental status with lethargy, minimal verbal output and CT head repeated 02/1 and was negative for bleed but she developed fever 101.4 with and leucocytosis on early am of 2/2. Blood cultures done for work up and she was started on Unasyn due to concerns of aspiration PNA. She has had increase in right facial weakness and RUE weakness and MBS repeated 02/4 due to functional decline with inability to elicit cough.   Patient with resultant dense right hemiparesis, expressive/receptive aphasia, dysphagia andMD and Rehab CM reporting that patient much improved this am and she was cleared medically to start CIR program.   Patient[facility]  [facility] has been reviewed by the rehabilitation admission coordinator and physician.  NIH Stroke scale: 15  Past Medical History      Past Medical History:  Diagnosis Date  . Colon polyp   . Constipation   . Diabetes mellitus without complication ([place])   . GI bleed   . Hemorrhoids   . High cholesterol   . Hypertension   . PAD (peripheral artery disease) ([place])   . Peripheral neuropathy   . Rectal bleeding    "for 7 years"  . Rectal bleeding    . Stroke [place])     Family History   family history includes Cancer in her father; Diabetes in her brother, daughter, daughter, maternal grandmother, mother, and sister.  Prior Rehab/Hospitalizations Has the patient had major surgery during 100 days prior to admission? No              Current Medications  See MAR from [facility]  Patients Current Diet:   Dys 1, nectar thick liquids  Precautions / Restrictions Precautions Precautions: Fall Precaution Comments: truncal ataxia, gross coordination deficits Restrictions Weight Bearing Restrictions: No Other Position/Activity Restrictions: Positioning of right upper extremity in sight of patient and not pulling on it as it is flaccid.    Has the patient had 2 or more falls or a fall with injury in the past year?No  Prior Activity Level Limited Community (1-2x/wk): Went out 2-3 X a week for appointments, out to eat.  Not driving.  Prior Functional Level Self Care: Did the patient need help bathing, dressing, using the toilet or eating?  Independent  Indoor Mobility: Did the patient need assistance with walking from room to room (with or without device)? Independent  Stairs: Did the patient need assistance with internal or external stairs (with or without device)? Independent  Functional Cognition: Did the patient need help planning regular tasks such as shopping or remembering to take medications? Independent  Home Assistive Devices / Equipment Home Assistive Devices/Equipment: Cane (specify quad or straight) Home Equipment: Cane - single point, Grab bars - tub/shower  Prior Device Use: Indicate devices/aids used by the patient prior to current illness, exacerbation or injury? Cane for longer distance.   Prior Functional Level Current Functional Level  Bed Mobility  Independent  Mod assist   Transfers  Independent  Max assist   Mobility - Walk/Wheelchair  Independent  Total assist    Upper Body Dressing  Independent  Mod assist   Lower Body Dressing  Independent  Max assist   Grooming  Independent  Other(Set up)   Eating/Drinking  Independent  Min assist   Toilet Transfer  Independent  Total assist   Bladder Continence   Wears depends  Incontinent, wears depends at baseline   Bowel Management  WDL  Last BM 06/18/17 with incontinence  Stair Chartered certified accountant)   Counsellor, intact  Aphasia, impaired   Memory  WDL  Impaired   Cooking/Meal Prep  Independent      Housework  Independent    Money Management  Independent    Driving  Not driving     Special needs/care consideration BiPAP/CPAP No CPM No Continuous Drip IV KVO with IV antibiotic  Dialysis No      Life Vest No Oxygen No Special Bed No Trach Size No Wound Vac (area) No     Skin Gets a rash at times                           Bowel mgmt: Last BM 06/18/17 with incontinence Bladder mgmt: Wears depends at home, incontinence Diabetic mgmt Yes, on insulin at home.  HgbA1C is 8.2  Previous Home Environment Living Arrangements: Children(son, dtr-in-law, 2 grandchildren)  Lives With: Family, Son Available Help at Discharge: Family Type of Home: House Home Layout: Two level, Laundry or work area in basement, Able to live on main level with bedroom/bathroom Alternate Level Stairs-Number of Steps: 20 steps to second level; 12 to basement Home Access: Stairs to enter Entrance Stairs-Rails: None Technical brewer of Steps: 4 Bathroom Shower/Tub: Chiropodist: [place]: No  Discharge Living Setting Plans for Discharge Living Setting: House, Lives with (comment)(Lives with son and dtr in law.) Type of Home at Discharge: House Discharge Home Layout: Multi-level, Laundry or work area in basement, [employer] on main level, Bed/bath upstairs, Able to live on main level  with bedroom/bathroom Alternate Level Stairs-Number of Steps: 20 steps to 2nd level and 12 steps down to basement Discharge Home Access: Stairs to enter Entrance Stairs-Number of Steps: 4 Does the patient have any problems obtaining your medications?: No  Social/Family/Support Systems Patient Roles: Parent, Other (Comment)(Has a son, dtr-in-law, grandchildren.) Contact Information: Denishia Citro - son - [phone] Anticipated Caregiver: [name] - dtr-in-law Anticipated Caregiver's Contact Information: Loleta Dicker in law - [phone] Ability/Limitations of Caregiver: Dtr in law stays at home and can assist Caregiver Availability: 24/7 Discharge Plan Discussed with Primary Caregiver: Yes Is Caregiver In Agreement with Plan?: Yes Does Caregiver/Family have Issues with Lodging/Transportation while Pt is in Rehab?: No  Goals/Additional Needs Patient/Family Goal for Rehab: PT supervision to min assist, OT supervision to min assist, SLP supervision to min assist goals Expected length of stay: 14-18 days Cultural Considerations: [employer] Dietary Needs: Dys 1, nectar thick liquids Equipment Needs: TBD Pt/Family Agrees to Admission and willing to participate: Yes Program Orientation Provided & Reviewed with Pt/Caregiver Including Roles  & Responsibilities: Yes  Patient Condition: I have reviewed the patient's chart and I have talked with patient's son.  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  Patient has been evaluated by PT/OT and SLP with ongoing therapies and recommendations for acute inpatient rehab admission.  Patient will benefit from 3 hours of therapy a day and from the coordinated approach of the rehab team.  Goals are to get patient back home with family after rehab discharge.  I have discussed all findings with rehab MD and I have approval for acute inpatient rehab admission for today.  Preadmission Screen Completed By:  Retta Diones, 06/20/2017  12:12 PM ______________________________________________________________________   Discussed status with Dr. Posey Pronto on 06/20/17 at 1216 and received telephone approval for admission today.  Admission Coordinator:  Retta Diones, time 1228/Date 06/20/17  Assessment/Plan: Diagnosis: L MCA stroke  1. Does the need for close, 24 hr/day  Medical supervision in concert with the patient's rehab needs make it unreasonable for this patient to be served in a less intensive setting? Yes  2. Co-Morbidities requiring supervision/potential complications: HTN, H2ZY, diverticulosis,recent issues with depression and had stopped taking allmedicationexcept insulin for few weeks. 3. Due to bladder management, safety, disease management, medication administration and patient education, does the patient require 24 hr/day rehab nursing? Yes 4. Does the patient require coordinated care of a physician, rehab nurse, PT (1-2 hrs/day, 5 days/week), OT (1-2 hrs/day, 5 days/week) and SLP (1-2 hrs/day, 5 days/week) to address physical and functional deficits in the context of the above medical diagnosis(es)? Yes Addressing deficits in the following areas: balance, endurance, locomotion, strength, transferring, bathing, dressing, grooming, toileting, cognition, speech, swallowing and psychosocial support 5. Can the patient actively participate in an intensive therapy program of at least 3 hrs of therapy 5 days a week? Potentially 6. The potential for patient to make measurable gains while on inpatient rehab is excellent 7. Anticipated functional outcomes upon discharge from inpatients are: min assist and mod assist PT, min assist and mod assist OT, supervision and min assist SLP 8. Estimated rehab length of stay to reach the above functional goals is: 19-23 days. 9. Does the patient have adequate social supports to accommodate these discharge functional goals? Potentially 10. Anticipated D/C setting:  Home 11. Anticipated post D/C treatments: HH therapy and Home excercise program 12. Overall Rehab/Functional Prognosis: good    RECOMMENDATIONS: This patient's condition is appropriate for continued rehabilitative care in the following setting: CIR Patient has agreed to participate in recommended program. Yes Note that insurance prior authorization may be required for reimbursement for recommended care.  Delice Lesch, MD, ABPMR Retta Diones 06/20/2017          Revision History

## 2017-06-20 NOTE — H&P (Signed)
Physical Medicine and Rehabilitation Admission H&P    Chief Complaint  Patient presents with  . Functional deficits due to L-MCA stroke    HPI: Sandra [name]  is a 77 y.o. female with history of HTN, T2DM, diverticulosis, recent issues with depression and had stopped taking all medication except insulin for few weeks. History taken from chart review. She was admitted to [facility] on 06/15/17 with elevated BP, confusion and 2 week history of dysarthria. 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  MRI/MRA brain done revealing moderate size L-MCA infarct affecting posterior lateral frontal lobe and multiple missing L-MCA branch vessels and moderately advanced chronic SVD.  2D echo showed EF 60-65% with no wall abnormality and mildly calcified MV and AV. Carotid dopplers with < 50% ICA stenosis. Swallow evaluation done and patient placed on dysphagia 1, nectar liquids with water protocol.  She had acute changes in mental status with lethargy, minimal verbal output and CT head repeated 02/1 reviewed, showing without changes to left MCA infarct.  Negative for bleed but she developed fever 101.4 with and leucocytosis on early am of 2/2.  Blood cultures done for work up and she was started on Unasyn due to concerns of aspiration PNA. She has had increase in right facial weakness and RUE weakness and MBS repeated 02/4 due to functional decline with inability to elicit cough.    Patient with resultant dense right hemiparesis, expressive/receptive aphasia, dysphagia and  MD and Rehab CM reporting that patient much improved this am and she was cleared medically to start CIR program.     Review of Systems  Unable to perform ROS: Medical condition      Past Medical History:  Diagnosis Date  . Colon polyp   . Constipation   .  Diabetes mellitus without complication ([place])   . GI bleed   . Hemorrhoids   . High cholesterol   . Hypertension   . PAD (peripheral artery disease) ([place])   . Peripheral neuropathy   . Rectal bleeding    "for 7 years"  . Rectal bleeding   . Stroke [facility])     Past Surgical History:  Procedure Laterality Date  . ABDOMINAL HYSTERECTOMY    . COLONOSCOPY     X 7  . COLONOSCOPY N/A 03/21/2017   Procedure: COLONOSCOPY;  Surgeon: Danie Binder, MD;  Location: [facility];  Service: Endoscopy;  Laterality: N/A;  1215     Family History  Problem Relation Age of Onset  . Diabetes Mother   . Cancer Father   . Diabetes Sister   . Diabetes Brother   . Diabetes Daughter   . Diabetes Maternal Grandmother   . Diabetes Daughter   . Colon cancer Neg Hx     Social History:  Lives with family. Independent PTA. Per reports that  has never smoked. she has never used smokeless tobacco. She reports that she does not drink alcohol or use drugs.     Allergies  Allergen Reactions  . Hctz [Hydrochlorothiazide] Other (See Comments)    "gave me pancreatitis"  . Morphine And Related     "made room spin"  . Valium [Diazepam] Other (See Comments)    Confusion, blurred vision   Medications Prior to Admission  Medication Sig Dispense Refill  . amLODipine (NORVASC) 10 MG tablet Take 10 mg by mouth daily.    Marland Kitchen  aspirin EC 81 MG tablet Take 81 mg by mouth daily.    . citalopram (CELEXA) 10 MG tablet Take 10 mg by mouth daily.    . ergocalciferol (DRISDOL) 8000 UNIT/ML drops Take by mouth daily. Takes 2 drops daily    . LANTUS 100 UNIT/ML injection Inject 0.38 mLs (38 Units total) at bedtime into the skin. 10 mL 0  . losartan (COZAAR) 100 MG tablet Take 100 mg by mouth daily.    . metFORMIN (GLUCOPHAGE) 500 MG tablet Take 500 mg by mouth 2 (two) times daily with a meal.    . metoprolol succinate (TOPROL-XL) 100 MG 24 hr tablet Take 1 tablet (100 mg total) by mouth daily. Take with or immediately  following a meal. 30 tablet 0  . Multiple Vitamins-Minerals (ONE-A-DAY 50 PLUS PO) Take 1 tablet by mouth daily.    Marland Kitchen olmesartan (BENICAR) 40 MG tablet Take 1 tablet (40 mg total) by mouth daily. 30 tablet 1  . Omega-3 Fatty Acids (FISH OIL) 1200 MG CAPS Take 1 capsule by mouth daily.    . polyethylene glycol powder (GLYCOLAX/MIRALAX) powder Take 17 g by mouth as needed for mild constipation or moderate constipation.     . simvastatin (ZOCOR) 20 MG tablet Take 20 mg by mouth daily at 6 PM.    . vitamin B-12 (CYANOCOBALAMIN) 1000 MCG tablet Take 1,000 mcg by mouth daily.    . hydrocortisone (ANUSOL-HC) 2.5 % rectal cream Apply rectally 2 times daily (Patient not taking: Reported on 05/24/2017) 28.35 g 1    Drug Regimen Review  Drug regimen was reviewed and remains appropriate with no significant issues identified  Home: Home Living Family/patient expects to be discharged to:: Private residence Living Arrangements: Children(son, dtr-in-law, 2 grandchildren) Available Help at Discharge: Family Type of Home: House Home Access: Stairs to enter Technical brewer of Steps: 4 Entrance Stairs-Rails: None Home Layout: Two level, Laundry or work area in basement, Able to live on main level with bedroom/bathroom Alternate Level Stairs-Number of Steps: 20 steps to second level; 12 to basement Bathroom Shower/Tub: Chiropodist: Standard Home Equipment: Radio producer - single point, Grab bars - tub/shower  Lives With: Family, Son   Functional History: Prior Function Level of Independence: Independent with assistive device(s) Comments: no AD for HH distances, SPC for short community distances  Functional Status:  Mobility: Bed Mobility Overal bed mobility: Needs Assistance Bed Mobility: Supine to Sit Supine to sit: Mod assist, HOB elevated(Uses left upper extremity to pull up to sitting) Sit to supine: Min assist General bed mobility comments: Patient demonstrated ability to  respond to tactile/visual cues in legs, uses LUE and hand held assist to pull self up, still requires heavy trunk assist. Transfers Overall transfer level: Needs assistance Equipment used: None(Patient was not able to use right upper extremity today. ) Transfers: Stand Pivot Transfers, Sit to/from Stand Sit to Stand: Max assist Stand pivot transfers: +2 physical assistance, Total assist(Assist to maintain balance and help patient move feet) General transfer comment: participated, but RLE gross coordination is limited and motor processing is slow Ambulation/Gait Ambulation/Gait assistance: (Not appropriate at this time; patient able to assist some with foot movement during stand pivot transfer) Ambulation Distance (Feet): 200 Feet Assistive device: Straight cane Gait Pattern/deviations: Step-through pattern, Staggering right, Drifts right/left General Gait Details: Drifted/staggered R intermittently during gait; increased unsteadiness as distance increased; 1 stumble towards end of gait which required min A  Gait velocity interpretation: <1.8 ft/sec, indicative of risk for recurrent falls  ADL: ADL Overall ADL's : Needs assistance/impaired Eating/Feeding: Set up, Sitting Eating/Feeding Details (indicate cue type and reason): Pt unable to open containers and packaging due to strength and coordination deficits in right hand. OT notes pt with difficulty grasping straw and bringing to mouth Grooming: Wash/dry hands, Wash/dry face, Set up, Bed level Grooming Details (indicate cue type and reason): pt able to wash face using left hand while in bed, unable to lift right arm to grasp washcloth Upper Body Bathing: Moderate assistance, Sitting Upper Body Bathing Details (indicate cue type and reason): Pt unable to use RUE for bathing, assist for bathing left side of body, back, and LB Lower Body Bathing: Moderate assistance, Bed level Lower Body Bathing Details (indicate cue type and reason): Pt  unable to use RUE for balance or bathing tasks, assist for bending and reaching lower legs/feet Upper Body Dressing : Moderate assistance, Sitting Upper Body Dressing Details (indicate cue type and reason): Assist for threading right arm through gown. Total assist for buttons/ties Lower Body Dressing: Minimal assistance, Sitting/lateral leans Lower Body Dressing Details (indicate cue type and reason): Pt able to use LUE for donning/fixing socks, unable to use right hand to assist Toilet Transfer Details (indicate cue type and reason): unable to complete  Functional mobility during ADLs: (unable to complete) General ADL Comments: Pt requiring significantly increased level of assistance compared to evaluation yesterday. Pt is unable to use RUE for any ADL tasks due to weakness and poor coordination  Cognition: Cognition Overall Cognitive Status: Impaired/Different from baseline Arousal/Alertness: Awake/alert Orientation Level: Oriented to person Memory: (unable to assess) Cognition Arousal/Alertness: Awake/alert Behavior During Therapy: WFL for tasks assessed/performed Overall Cognitive Status: Impaired/Different from baseline Area of Impairment: Following commands Following Commands: Follows one step commands inconsistently General Comments: Patient responded best to visual and tactile cueing this session. Therapist provided demonstration. Patient demonstrated inconsistent use of basic vocalizations for yes/no, inititates and ceases activity as directed with tactile cueing, but difficulty with motor processing. Therapist drew pictures to give patient options for lunch choices and patient was able to point to her choice.    Blood pressure (!) 156/90, pulse 81, temperature 99.2 F (37.3 C), temperature source Oral, resp. rate 16, height 5' 5"  (1.651 m), weight 89.7 kg (197 lb 12 oz), SpO2 91 %. Physical Exam  Nursing note and vitals reviewed. Constitutional: She appears well-developed and  well-nourished. No distress.  Obese female. Lying in bed--Incontinent of urine and rubbing RLQ.  HENT:  Head: Normocephalic and atraumatic.  Edentulous   Eyes: Conjunctivae are normal. Pupils are equal, round, and reactive to light. Right eye exhibits no discharge. Left eye exhibits no discharge.  Neck: Normal range of motion. Neck supple.  Cardiovascular: Normal rate and regular rhythm.  Respiratory: Effort normal and breath sounds normal. No respiratory distress.  GI: Soft. Bowel sounds are normal.  Musculoskeletal: She exhibits edema. She exhibits no tenderness.  Left hip discomfort with extension of leg and attempts at ROM  Neurological: She is alert.  Right  inattention.  Globally aphasic and unable to recognize her name with choice of two, point to objects or follow simple motor commands.  Verbal output limited to occasional grunting sounds. Dense right hemiparesis with presumed sensory deficits. Right knee was flexed up to 90 degrees and externally rotated.   Emerging tone and had discomfort with full extension.  Motor exam limited due to participation: No movement right side, spontaneous movement noted on left side  Skin: Skin is warm and dry. She is  not diaphoretic.  Psychiatric: Her affect is blunt. She is slowed and withdrawn. She is noncommunicative. She is inattentive.    Results for orders placed or performed during the hospital encounter of 06/15/17 (from the past 48 hour(s))  Glucose, capillary     Status: Abnormal   Collection Time: 06/18/17  4:23 PM  Result Value Ref Range   Glucose-Capillary 195 (H) 65 - 99 mg/dL  Glucose, capillary     Status: Abnormal   Collection Time: 06/19/17  7:54 AM  Result Value Ref Range   Glucose-Capillary 349 (H) 65 - 99 mg/dL  CBC with Differential/Platelet     Status: Abnormal   Collection Time: 06/19/17  8:05 AM  Result Value Ref Range   WBC 15.1 (H) 4.0 - 10.5 K/uL   RBC 4.98 3.87 - 5.11 MIL/uL   Hemoglobin 14.0 12.0 - 15.0 g/dL    HCT 43.0 36.0 - 46.0 %   MCV 86.3 78.0 - 100.0 fL   MCH 28.1 26.0 - 34.0 pg   MCHC 32.6 30.0 - 36.0 g/dL   RDW 13.9 11.5 - 15.5 %   Platelets 248 150 - 400 K/uL   Neutrophils Relative % 76 %   Neutro Abs 11.4 (H) 1.7 - 7.7 K/uL   Lymphocytes Relative 12 %   Lymphs Abs 1.7 0.7 - 4.0 K/uL   Monocytes Relative 12 %   Monocytes Absolute 1.9 (H) 0.1 - 1.0 K/uL   Eosinophils Relative 0 %   Eosinophils Absolute 0.0 0.0 - 0.7 K/uL   Basophils Relative 0 %   Basophils Absolute 0.0 0.0 - 0.1 K/uL    Comment: Performed at [facility], [address], [place], [place] [address]  Comprehensive metabolic panel     Status: Abnormal   Collection Time: 06/19/17  8:05 AM  Result Value Ref Range   Sodium 137 135 - 145 mmol/L   Potassium 4.0 3.5 - 5.1 mmol/L   Chloride 97 (L) 101 - 111 mmol/L   CO2 28 22 - 32 mmol/L   Glucose, Bld 365 (H) 65 - 99 mg/dL   BUN 27 (H) 6 - 20 mg/dL   Creatinine, Ser 0.97 0.44 - 1.00 mg/dL   Calcium 9.1 8.9 - 10.3 mg/dL   Total Protein 7.2 6.5 - 8.1 g/dL   Albumin 3.2 (L) 3.5 - 5.0 g/dL   AST 25 15 - 41 U/L   ALT 15 14 - 54 U/L   Alkaline Phosphatase 77 38 - 126 U/L   Total Bilirubin 1.2 0.3 - 1.2 mg/dL   GFR calc non Af Amer 55 (L) >60 mL/min   GFR calc Af Amer >60 >60 mL/min    Comment: (NOTE) The eGFR has been calculated using the CKD EPI equation. This calculation has not been validated in all clinical situations. eGFR's persistently <60 mL/min signify possible Chronic Kidney Disease.    Anion gap 12 5 - 15    Comment: Performed at [facility], [address] [place], [place] [address]  Sedimentation rate     Status: Abnormal   Collection Time: 06/19/17  8:05 AM  Result Value Ref Range   Sed Rate 66 (H) 0 - 22 mm/hr    Comment: Performed at [facility], [address], [place], [place] [address]  C-reactive protein     Status: Abnormal   Collection Time: 06/19/17  8:05 AM  Result Value Ref Range   CRP 19.8 (H) <1.0 mg/dL    Comment: Performed at  [facility],  [address], [place], [place] [address]  TSH     Status: None   Collection Time: 06/19/17  8:05 AM  Result Value Ref Range   TSH 0.717 0.350 - 4.500 uIU/mL    Comment: Performed by a 3rd Generation assay with a functional sensitivity of <=0.01 uIU/mL. Performed at [facility], [address] [place], [place] [address]   Culture, blood (Routine X 2) w Reflex to ID Panel     Status: None (Preliminary result)   Collection Time: 06/19/17  8:14 AM  Result Value Ref Range   Specimen Description BLOOD LEFT HAND    Special Requests      BOTTLES DRAWN AEROBIC AND ANAEROBIC Blood Culture adequate volume   Culture      NO GROWTH < 24 HOURS Performed at [facility], [address], [place], [place] [address]    Report Status PENDING   Culture, blood (Routine X 2) w Reflex to ID Panel     Status: None (Preliminary result)   Collection Time: 06/19/17  8:15 AM  Result Value Ref Range   Specimen Description RIGHT ANTECUBITAL    Special Requests      BOTTLES DRAWN AEROBIC AND ANAEROBIC Blood Culture adequate volume   Culture      NO GROWTH < 24 HOURS Performed at [facility], [address] [place], [place] [address]    Report Status PENDING   Glucose, capillary     Status: Abnormal   Collection Time: 06/19/17 10:58 AM  Result Value Ref Range   Glucose-Capillary 484 (H) 65 - 99 mg/dL  Glucose, capillary     Status: Abnormal   Collection Time: 06/19/17 12:06 PM  Result Value Ref Range   Glucose-Capillary 384 (H) 65 - 99 mg/dL  Glucose, capillary     Status: Abnormal   Collection Time: 06/19/17  4:20 PM  Result Value Ref Range   Glucose-Capillary 308 (H) 65 - 99 mg/dL  Glucose, capillary     Status: Abnormal   Collection Time: 06/19/17  9:46 PM  Result Value Ref Range   Glucose-Capillary 259 (H) 65 - 99 mg/dL  CBC with Differential/Platelet     Status: Abnormal   Collection Time: 06/20/17  5:28 AM  Result Value Ref Range   WBC 14.2 (H) 4.0 - 10.5 K/uL   RBC  4.77 3.87 - 5.11 MIL/uL   Hemoglobin 13.3 12.0 - 15.0 g/dL   HCT 41.3 36.0 - 46.0 %   MCV 86.6 78.0 - 100.0 fL   MCH 27.9 26.0 - 34.0 pg   MCHC 32.2 30.0 - 36.0 g/dL   RDW 14.1 11.5 - 15.5 %   Platelets 257 150 - 400 K/uL   Neutrophils Relative % 73 %   Neutro Abs 10.4 (H) 1.7 - 7.7 K/uL   Lymphocytes Relative 14 %   Lymphs Abs 2.0 0.7 - 4.0 K/uL   Monocytes Relative 13 %   Monocytes Absolute 1.8 (H) 0.1 - 1.0 K/uL   Eosinophils Relative 0 %   Eosinophils Absolute 0.0 0.0 - 0.7 K/uL   Basophils Relative 0 %   Basophils Absolute 0.0 0.0 - 0.1 K/uL    Comment: Performed at [facility], [address] [place],  [address]  Comprehensive metabolic panel     Status: Abnormal   Collection Time: 06/20/17  5:28 AM  Result Value Ref Range   Sodium 141 135 - 145 mmol/L   Potassium 3.6 3.5 - 5.1 mmol/L   Chloride 99 (L) 101 -  111 mmol/L   CO2 29 22 - 32 mmol/L   Glucose, Bld 224 (H) 65 - 99 mg/dL   BUN 32 (H) 6 - 20 mg/dL   Creatinine, Ser 0.86 0.44 - 1.00 mg/dL   Calcium 9.0 8.9 - 10.3 mg/dL   Total Protein 6.6 6.5 - 8.1 g/dL   Albumin 2.8 (L) 3.5 - 5.0 g/dL   AST 22 15 - 41 U/L   ALT 13 (L) 14 - 54 U/L   Alkaline Phosphatase 68 38 - 126 U/L   Total Bilirubin 0.5 0.3 - 1.2 mg/dL   GFR calc non Af Amer >60 >60 mL/min   GFR calc Af Amer >60 >60 mL/min    Comment: (NOTE) The eGFR has been calculated using the CKD EPI equation. This calculation has not been validated in all clinical situations. eGFR's persistently <60 mL/min signify possible Chronic Kidney Disease.    Anion gap 13 5 - 15    Comment: Performed at [facility], [address] [place], [place] [address]  Glucose, capillary     Status: Abnormal   Collection Time: 06/20/17  7:40 AM  Result Value Ref Range   Glucose-Capillary 211 (H) 65 - 99 mg/dL   Dg Chest Port 1 View  Result Date: 06/19/2017 CLINICAL DATA:  Fever EXAM: PORTABLE CHEST 1 VIEW COMPARISON:  None. FINDINGS: There is mild bilateral interstitial  thickening. There is no focal consolidation. There is no pleural effusion or pneumothorax. The heart and mediastinal contours are unremarkable. The osseous structures are unremarkable. IMPRESSION: Bilateral interstitial thickening which may reflect interstitial infection versus mild interstitial edema. Electronically Signed   By: Kathreen Devoid   On: 06/19/2017 10:37       Medical Problem List and Plan: 1. Functional deficits secondary to  due to [facility] stroke with extension  2.  DVT Prophylaxis/Anticoagulation: Pharmaceutical: Lovenox 3. Pain Management: tylenol prn 4. Mood: LCSW to follow for evaluation when appropriate.  5. Neuropsych: This patient is not capable of making decisions on her own behalf. 6. Skin/Wound Care: Routine pressure relief measures. Maintain adequate nutritional and hydration status.  7. Fluids/Electrolytes/Nutrition: Monitor I/O. Offer supplements prn if intake is poor. Will check lytes in am. 8. T2DM: Hgb A1C- Monitor BS ac/hs and use SSI for elevated BS. Continue to titrate medications for tighter control.  9. HTN: Poorly controlled due to compliance issues.  10. Fever with leucocytosis: Continue Unasyn D# 3.  11. Dysphagia: Continue dysphagia 1, nectar liquids with assistance at meals to help maintain adequate hydration.  12. Recent episode of  Depression: Now onCelexa last month.   13. Obesity   Body mass index is 32.91 kg/m.   Diet and exercise education   Will cont to encourage weight loss to increase endurance and promote overall health  Post Admission Physician Evaluation: 1. Preadmission assessment reviewed and changes made below. 2. Functional deficits secondary  to left MCA infarct.. 3. Patient is admitted to receive collaborative, interdisciplinary care between the physiatrist, rehab nursing staff, and therapy team. 4. Patient's level of medical complexity and substantial therapy needs in context of that medical necessity cannot be provided at a lesser  intensity of care such as a SNF. 5. Patient has experienced substantial functional loss from his/her baseline which was documented above under the "Functional History" and "Functional Status" headings.  Judging by the patient's diagnosis, physical exam, and functional history, the patient has potential for functional progress which will result in measurable gains while on inpatient rehab.  These gains will  be of substantial and practical use upon discharge  in facilitating mobility and self-care at the household level. 78. Physiatrist will provide 24 hour management of medical needs as well as oversight of the therapy plan/treatment and provide guidance as appropriate regarding the interaction of the two. 7. 24 hour rehab nursing will assist with bladder management, bowel management, safety, skin/wound care, disease management, medication administration, pain management and patient education  and help integrate therapy concepts, techniques,education, etc. 8. PT will assess and treat for/with: Lower extremity strength, range of motion, stamina, balance, functional mobility, safety, adaptive techniques and equipment, coping skills, pain control, stroke education.   Goals are: Mod A. 9. OT will assess and treat for/with: ADL's, functional mobility, safety, upper extremity strength, adaptive techniques and equipment, ego support, and community reintegration.   Goals are: Mod A. Therapy may proceed with showering this patient. 10. SLP will assess and treat for/with: speech, language, cognition, swallowing.  Goals are: Mod A. 11. Case Management and Social Worker will assess and treat for psychological issues and discharge planning. 12. Team conference will be held weekly to assess progress toward goals and to determine barriers to discharge. 13. Patient will receive at least 3 hours of therapy per day at least 5 days per week. 14. ELOS: 22-27 days.       15. Prognosis:  good and fair  Delice Lesch, MD,  ABPMR Bary Leriche, PA-C 06/20/2017

## 2017-06-20 NOTE — Care Management Important Message (Signed)
Important Message  Patient Details  Name: Sandra [name] MRN: [id number] Date of Birth: 07/06/1940   Medicare Important Message Given:  Yes    Myrah Strawderman, Chauncey Reading, RN 06/20/2017, 10:46 AM

## 2017-06-20 NOTE — Discharge Summary (Addendum)
Physician Discharge Summary  Cherisa Brucker FXT:[id number] DOB: 11-17-1940 DOA: 06/15/2017  PCP: Caren Macadam, MD Neurologist: Dr. Norton Blizzard  Admit date: 06/15/2017 Discharge date: 06/20/2017  Admitted From: Home  Disposition: C.I.R.  Discharge Recommendations:  1. Follow up with PCP in 2 weeks 2. Follow up with neurology in 1 month 3. Please obtain BMP/CBC in one week 4. Please follow up on final culture data. TAKE ASPIRIN AND PLAVIX TOGETHER FOR 3 MONTHS, THEN STOP ASPIRIN AND TAKE PLAVIX ALONE AFTER THAT.   CHECK BLOOD SUGARS 4 TIMES PER DAY AND ADJUST THERAPY AS NEEDED FOR GOOD GLYCEMIC CONTROL.  REDUCE INSULIN DOSES BY 50% FOR ANY BLOOD SUGAR LESS THAN 80.    CONTINUE ANTIBIOTICS FOR 6 DAYS THEN STOP.  REPEAT CHEST XRAY IN 1 MONTH.   Discharge Condition: STABLE   CODE STATUS: FULL   DIET RECOMMENDATIONS per SLP Swallow Evaluation Recommendations   SLP Diet Recommendations: Dysphagia 1 (Puree) solids;Nectar thick liquid   Liquid Administration via: Spoon;Cup;Straw   Medication Administration: Whole meds with puree   Supervision: Full assist for feeding;Full supervision/cueing for compensatory strategies   Compensations: Minimize environmental distractions;Slow rate;Small sips/bites;Lingual sweep for clearance of pocketing   Postural Changes: Remain semi-upright after after feeds/meals (Comment);Seated upright at 90 degrees   Oral Care Recommendations: Oral care before and after PO;Staff/trained caregiver to provide oral care   Brief Hospitalization Summary: Please see all hospital notes, images, labs for full details of the hospitalization. Ilanna Deihl  is a 77 y.o. female, w hypertension, hyperlipidemia, Dm2, CVA,  apparently not taking bp medication for the past 2 weeks presents with dysarthria.  Pt is not taking aspirin at home. Per teleneurology last known well 1200 on 06/14/2017.  Pt has difficulty with comprehension as well it appears.    Pt was  seen in PCP office yesterday and treated for her hypertension, sbp >200.  Pt was worse today per her daughter in law and therefore brought to ED.   In ED, evaluated by teleneurology, outside TPA window  Etoh <10 INR 1.09 PTT  33  Wbc 10.1, hgb 14.3, Plt  Na 141, K 3.7, Bun 17, Creatinine 0.78  Pt admitted for dysarthria and receptive aphasia and new CVA secondary to poor bp control.   Brief Admission Hx: 77 y.o.female,w hypertension, hyperlipidemia, Dm2, CVA, apparently not taking bp medication for the past 2 weeks presents with dysarthria. Pt is not taking aspirin at home. Per teleneurology last known well 1200 on 06/14/2017. Pt has difficulty with comprehension as well it appears.  The patient was noted to have an acute CVA by MRI study.  MDM/Assessment & Plan:   1. Moderate size left MCA stroke- neurologist recommending dual antiplatelet therapy x 3 months, atorvastatin 40 mg daily, hemoglobin A1c 8.2%, carotid ultrasound studies no significant stenosis, echocardiogram below, PT/OT recommending CIR.  Pt accepted for CIR and will transfer today.  SLP following.  MBS performed today prior to discharge. Pt only minimal improvement with speech.  Pt had acute change in functional status 2/1, I spoke with neurologist Dr. Merlene Laughter and he recommended getting CT head to evaluate for hemorrhagic transformation.  The repeat CT did not show hemorrhagic transformation.  2. Dysphagia - SLP is planning modified barium study.  Follow SLP recommendations for diet.   3. Fever - suspect secondary to mild aspiration pneumonia.  Improving with treatment. Tylenol as needed.   4. Aspiration pneumonia - IV unasyn started in hospital. Discharge on oral augmentin x 6 more days then stop. Follow  blood cultures, supportive therapy.  5. Dyslipidemia-uncontrolled as evidenced by an LDL cholesterol of 150.  I have started the patient on atorvastatin 40 mg daily. 6. Constipation - BM x 1 reported,  stool  softeners ordered, give suppository.   7. Uncontrolled hypertension- given the acute CVA  allowed for permissive hypertension and can now slowly resume blood pressure medications but reduced the doses by 50%.  Monitor blood pressure and adjust treatment as needed for good blood pressure control. 8. Aphasia-Pt will need ongoing SLP therapy and CIR services. 9. Type 2 diabetes mellitus uncontrolled- A1c 8.2% which means poor control of disease.  Added lantus and prandial novolog, resume home metformin.  Monitor BS 4 times per day and adjust therapy as needed for good glycemic control.  REDUCE INSULIN DOSES BY 50% FOR ANY BLOOD SUGAR LESS THAN 80.   DVT prophylaxis: SCDs Code Status: Full Family Communication: spoke with son Disposition Plan: CIR  Consultants:  Neurology  SPEECH THERAPY  PHYSICAL THERAPY  Procedures:  Echocardiogram: Study Conclusions - Left ventricle: The cavity size was normal. Wall thickness was increased in a pattern of mild LVH. Systolic function was normal. The estimated ejection fraction was in the range of 60% to 65%. Wall motion was normal; there were no regional wall motion abnormalities. Doppler parameters are consistent with abnormal left ventricular relaxation (grade 1 diastolic dysfunction). - Aortic valve: Mildly calcified annulus. Trileaflet. - Mitral valve: Mildly calcified annulus. There was trivial regurgitation. - Right atrium: Central venous pressure (est): 3 mm Hg. - Atrial septum: No defect or patent foramen ovale was identified. - Pulmonary arteries: Systolic pressure could not be accurately estimated. - Pericardium, extracardiac: There was no pericardial effusion.  Carotid Dopplers IMPRESSION: No significant carotid stenosis identified in the neck bilaterally. Estimated bilateral ICA stenoses are less than 50%. Minimal to mild plaque present, as above.  Discharge Diagnoses:  Principal Problem:   Stroke  [facility]) Active Problems:   Essential hypertension   Type 2 diabetes mellitus with neurological complications ([place])   Hyperlipidemia associated with type 2 diabetes mellitus [facility])  Discharge Instructions: Discharge Instructions    Increase activity slowly   Complete by:  As directed      Allergies as of 06/20/2017      Reactions   Hctz [hydrochlorothiazide] Other (See Comments)   "gave me pancreatitis"   Morphine And Related    "made room spin"   Valium [diazepam] Other (See Comments)   Confusion, blurred vision      Medication List    STOP taking these medications   Fish Oil 1200 MG Caps   hydrocortisone 2.5 % rectal cream Commonly known as:  ANUSOL-HC   metFORMIN 500 MG tablet Commonly known as:  GLUCOPHAGE Replaced by:  metFORMIN 500 MG 24 hr tablet   olmesartan 40 MG tablet Commonly known as:  BENICAR   simvastatin 20 MG tablet Commonly known as:  ZOCOR     TAKE these medications   acetaminophen 325 MG tablet Commonly known as:  TYLENOL Take 2 tablets (650 mg total) by mouth every 4 (four) hours as needed for mild pain, fever or headache (or temp > 37.5 C (99.5 F)).   amLODipine 5 MG tablet Commonly known as:  NORVASC Take 1 tablet (5 mg total) by mouth daily. Start taking on:  06/21/2017 What changed:    medication strength  how much to take   amoxicillin-clavulanate 875-125 MG tablet Commonly known as:  AUGMENTIN Take 1 tablet by mouth 2 (two) times daily  for 6 days.   aspirin EC 81 MG tablet Take 1 tablet (81 mg total) by mouth daily.   atorvastatin 40 MG tablet Commonly known as:  LIPITOR Take 1 tablet (40 mg total) by mouth daily at 6 PM.   citalopram 10 MG tablet Commonly known as:  CELEXA Take 10 mg by mouth daily.   clopidogrel 75 MG tablet Commonly known as:  PLAVIX Take 1 tablet (75 mg total) by mouth daily with breakfast. Start taking on:  06/21/2017   ergocalciferol 8000 UNIT/ML drops Commonly known as:  DRISDOL Take by mouth daily.  Takes 2 drops daily   insulin aspart 100 UNIT/ML injection Commonly known as:  novoLOG Inject 6 Units into the skin 3 (three) times daily with meals. GIVE ONLY IF EATS 50% OR MORE OF MEAL.  HOLD IF NPO OR NOT EATING WELL OR WITH NAUSEA OR VOMITING.   LANTUS 100 UNIT/ML injection Generic drug:  insulin glargine Inject 0.25 mLs (25 Units total) into the skin daily. What changed:    how much to take  when to take this   losartan 50 MG tablet Commonly known as:  COZAAR Take 1 tablet (50 mg total) by mouth daily. Start taking on:  06/21/2017 What changed:    medication strength  how much to take   metFORMIN 500 MG 24 hr tablet Commonly known as:  GLUCOPHAGE XR Take 1 tablet (500 mg total) by mouth 2 (two) times daily with a meal. Replaces:  metFORMIN 500 MG tablet   metoprolol succinate 50 MG 24 hr tablet Commonly known as:  TOPROL-XL Take 1 tablet (50 mg total) by mouth daily. Take with or immediately following a meal. Start taking on:  06/21/2017 What changed:    medication strength  how much to take   ONE-A-DAY 50 PLUS PO Take 1 tablet by mouth daily.   polyethylene glycol powder powder Commonly known as:  GLYCOLAX/MIRALAX Take 17 g by mouth daily. What changed:    when to take this  reasons to take this   vitamin B-12 1000 MCG tablet Commonly known as:  CYANOCOBALAMIN Take 1,000 mcg by mouth daily.      Follow-up Information    Caren Macadam, MD. Schedule an appointment as soon as possible for a visit in 2 week(s).   Specialty:  Family Medicine Why:  Hospital Follow up  Contact information: [address] [place] [address] [phone]        Phillips Odor, MD. Schedule an appointment as soon as possible for a visit in 1 month(s).   Specialty:  Neurology Why:  Hospital Follow Up  Contact information: [address] [place] [address] [phone]          Allergies  Allergen Reactions  . Hctz [Hydrochlorothiazide]  Other (See Comments)    "gave me pancreatitis"  . Morphine And Related     "made room spin"  . Valium [Diazepam] Other (See Comments)    Confusion, blurred vision   Allergies as of 06/20/2017      Reactions   Hctz [hydrochlorothiazide] Other (See Comments)   "gave me pancreatitis"   Morphine And Related    "made room spin"   Valium [diazepam] Other (See Comments)   Confusion, blurred vision      Medication List    STOP taking these medications   Fish Oil 1200 MG Caps   hydrocortisone 2.5 % rectal cream Commonly known as:  ANUSOL-HC   metFORMIN 500 MG tablet Commonly  known as:  GLUCOPHAGE Replaced by:  metFORMIN 500 MG 24 hr tablet   olmesartan 40 MG tablet Commonly known as:  BENICAR   simvastatin 20 MG tablet Commonly known as:  ZOCOR     TAKE these medications   acetaminophen 325 MG tablet Commonly known as:  TYLENOL Take 2 tablets (650 mg total) by mouth every 4 (four) hours as needed for mild pain, fever or headache (or temp > 37.5 C (99.5 F)).   amLODipine 5 MG tablet Commonly known as:  NORVASC Take 1 tablet (5 mg total) by mouth daily. Start taking on:  06/21/2017 What changed:    medication strength  how much to take   amoxicillin-clavulanate 875-125 MG tablet Commonly known as:  AUGMENTIN Take 1 tablet by mouth 2 (two) times daily for 6 days.   aspirin EC 81 MG tablet Take 1 tablet (81 mg total) by mouth daily.   atorvastatin 40 MG tablet Commonly known as:  LIPITOR Take 1 tablet (40 mg total) by mouth daily at 6 PM.   citalopram 10 MG tablet Commonly known as:  CELEXA Take 10 mg by mouth daily.   clopidogrel 75 MG tablet Commonly known as:  PLAVIX Take 1 tablet (75 mg total) by mouth daily with breakfast. Start taking on:  06/21/2017   ergocalciferol 8000 UNIT/ML drops Commonly known as:  DRISDOL Take by mouth daily. Takes 2 drops daily   insulin aspart 100 UNIT/ML injection Commonly known as:  novoLOG Inject 6 Units into the skin 3  (three) times daily with meals. GIVE ONLY IF EATS 50% OR MORE OF MEAL.  HOLD IF NPO OR NOT EATING WELL OR WITH NAUSEA OR VOMITING.   LANTUS 100 UNIT/ML injection Generic drug:  insulin glargine Inject 0.25 mLs (25 Units total) into the skin daily. What changed:    how much to take  when to take this   losartan 50 MG tablet Commonly known as:  COZAAR Take 1 tablet (50 mg total) by mouth daily. Start taking on:  06/21/2017 What changed:    medication strength  how much to take   metFORMIN 500 MG 24 hr tablet Commonly known as:  GLUCOPHAGE XR Take 1 tablet (500 mg total) by mouth 2 (two) times daily with a meal. Replaces:  metFORMIN 500 MG tablet   metoprolol succinate 50 MG 24 hr tablet Commonly known as:  TOPROL-XL Take 1 tablet (50 mg total) by mouth daily. Take with or immediately following a meal. Start taking on:  06/21/2017 What changed:    medication strength  how much to take   ONE-A-DAY 50 PLUS PO Take 1 tablet by mouth daily.   polyethylene glycol powder powder Commonly known as:  GLYCOLAX/MIRALAX Take 17 g by mouth daily. What changed:    when to take this  reasons to take this   vitamin B-12 1000 MCG tablet Commonly known as:  CYANOCOBALAMIN Take 1,000 mcg by mouth daily.      Procedures/Studies: Ct Angio Head W Or Wo Contrast  Result Date: 06/16/2017 CLINICAL DATA:  Subacute LEFT infarct, dysarthria for 2 weeks. History of hypertension, diabetes. EXAM: CT ANGIOGRAPHY HEAD TECHNIQUE: Multidetector CT imaging of the head was performed using the standard protocol during bolus administration of intravenous contrast. Multiplanar CT image reconstructions and MIPs were obtained to evaluate the vascular anatomy. CONTRAST:  41mL ISOVUE-370 IOPAMIDOL (ISOVUE-370) INJECTION 76% COMPARISON:  MRI/MRA head June 16, 2017. Carotid ultrasound June 16, 2017 FINDINGS: CT HEAD BRAIN: LEFT frontal parietal loss of  gray-white matter differentiation with mild edema in  regional mass effect. Patchy to confluent supratentorial white matter hypodensities exclusive of aforementioned abnormality. No midline shift, mass effect. Old suspected bilateral basal ganglia and LEFT thalamus lacunar infarcts. No abnormal extra-axial fluid collections. Basal cisterns are patent. VASCULAR: Moderate calcific atherosclerosis of the carotid siphons. SKULL: No skull fracture. No significant scalp soft tissue swelling. SINUSES/ORBITS: The mastoid air-cells and included paranasal sinuses are well-aerated.The included ocular globes and orbital contents are non-suspicious. Status post bilateral ocular lens implants. OTHER: None. CTA HEAD FINDINGS: ANTERIOR CIRCULATION: Patent cervical internal carotid arteries, petrous, cavernous and supra clinoid internal carotid arteries. Calcified plaque resulting in moderate stenosis carotid siphons. Patent anterior communicating artery. Occluded proximal LEFT M2 segment, superior division. Otherwise patent anterior and middle cerebral arteries. Scattered severe stenoses bilateral anterior and middle cerebral arteries. No large vessel occlusion, significant stenosis, contrast extravasation or aneurysm. POSTERIOR CIRCULATION: Patent vertebral arteries, vertebrobasilar junction and basilar artery, as well as main branch vessels. Patent posterior cerebral arteries. Severe tandem stenosis bilateral posterior cerebral arteries. No large vessel occlusion, significant stenosis, contrast extravasation or aneurysm. VENOUS SINUSES: Major dural venous sinuses are patent though not tailored for evaluation on this angiographic examination. ANATOMIC VARIANTS: None. DELAYED PHASE: No abnormal intracranial enhancement. MIP images reviewed. IMPRESSION: CT HEAD: 1. Acute to subacute appearing LEFT frontoparietal/MCA territory nonhemorrhagic infarct. 2. Moderate to severe chronic small vessel ischemic disease, old lacunar infarcts. CTA HEAD: 1. LEFT M2 occlusion, considering above  findings this is likely acute to subacute. 2. Advanced intracranial atherosclerosis resulting in scattered severe stenoses anterior and posterior circulation. These results will be called to the ordering clinician or representative by the Radiologist Assistant, and communication documented in the PACS or zVision Dashboard. Electronically Signed   By: Elon Alas M.D.   On: 06/16/2017 18:42   Ct Head Wo Contrast  Result Date: 06/17/2017 CLINICAL DATA:  Stroke follow-up. EXAM: CT HEAD WITHOUT CONTRAST TECHNIQUE: Contiguous axial images were obtained from the base of the skull through the vertex without intravenous contrast. COMPARISON:  Head CT from yesterday.  Brain MRI from yesterday. FINDINGS: Brain: Moderate to large area of cytotoxic edema in the central left MCA distribution at the frontal lobe. When allowing for motion artifact on yesterday's brain MRI there is no convincing progression. By CT the edema has become more dense and well-defined. Allowing for residual blurred cortex along the inferior margin there is no hemorrhagic conversion. No new distribution of infarct is seen. Chronic small vessel ischemic change in the deep cerebral white matter. No hydrocephalus. No shift. Vascular: Atherosclerotic calcification.  No hyperdense vessel. Skull: Negative Sinuses/Orbits: Negative IMPRESSION: Moderate to large left MCA branch infarct without hemorrhagic conversion or suspected progression since yesterday. Electronically Signed   By: Monte Fantasia M.D.   On: 06/17/2017 13:10   Ct Head Wo Contrast  Result Date: 06/15/2017 CLINICAL DATA:  Difficulty speaking since yesterday EXAM: CT HEAD WITHOUT CONTRAST TECHNIQUE: Contiguous axial images were obtained from the base of the skull through the vertex without intravenous contrast. COMPARISON:  11/29/2016 FINDINGS: Brain: There is no evidence for acute hemorrhage, hydrocephalus, mass lesion, or abnormal extra-axial fluid collection. No definite CT  evidence for acute infarction. Diffuse loss of parenchymal volume is consistent with atrophy. Patchy low attenuation in the deep hemispheric and periventricular white matter is nonspecific, but likely reflects chronic microvascular ischemic demyelination. Vascular: No hyperdense vessel or unexpected calcification. Skull: No evidence for fracture. No worrisome lytic or sclerotic lesion. Sinuses/Orbits: The visualized paranasal sinuses  and mastoid air cells are clear. Visualized portions of the globes and intraorbital fat are unremarkable. Other: None. IMPRESSION: Stable.  No acute intracranial abnormality Atrophy with chronic small vessel white matter ischemic disease. Electronically Signed   By: Misty Stanley M.D.   On: 06/15/2017 15:03   Mr Brain Wo Contrast  Result Date: 06/16/2017 CLINICAL DATA:  Difficulty speaking. Unable to write with the right hand. EXAM: MRI HEAD WITHOUT CONTRAST MRA HEAD WITHOUT CONTRAST TECHNIQUE: Multiplanar, multiecho pulse sequences of the brain and surrounding structures were obtained without intravenous contrast. Angiographic images of the head were obtained using MRA technique without contrast. COMPARISON:  Head CT 06/15/2017 and MRI 11/29/2016 FINDINGS: MRI HEAD FINDINGS The patient could not tolerate the complete examination. Sagittal T1, axial and coronal diffusion, and axial T2 sequences were obtained and are motion degraded, severely so on the sagittal T1 sequence. Brain: There is a moderate-sized acute MCA infarct involving the posterior left frontal lobe and perirolandic region. No associated hemorrhage is identified on this limited study. No mass, midline shift, or extra-axial fluid collection is seen. There is mild generalized cerebral atrophy. Chronic lacunar infarcts are present in the deep gray nuclei and deep cerebral white matter bilaterally as well as in the right cerebellum. Patchy to confluent T2 hyperintensities elsewhere in the cerebral white matter bilaterally  are similar to the prior MRI and compatible with moderately advanced chronic small vessel ischemic disease. Vascular: Major intracranial vascular flow voids are preserved. Skull and upper cervical spine: No suspicious marrow lesion identified. Sinuses/Orbits: Bilateral cataract extraction. Minimal mucosal thickening in the paranasal sinuses. Clear mastoid air cells. Other: None. MRA HEAD FINDINGS There is intermittent severe motion artifact. The visualized distal vertebral arteries are patent and codominant. The vertebrobasilar junction and proximal half of the basilar artery are completely obscured by motion artifact. The distal basilar artery is widely patent. Posterior communicating arteries are diminutive or absent. The P1 segments are patent, however artifact results in nondiagnostic evaluation of the more distal PCAs. The horizontal petrous segments of both ICAs are completely obscured by motion artifact. The intracranial ICAs are patent proximal and distal to this without evidence of high-grade stenosis. The proximal left M1 segment is patent, however motion artifact obscures the distal M1 segment and MCA bifurcation and patency at this level cannot be established. There are multiple missing left MCA branch vessels, particularly in the superior division. The right MCA and both ACAs are grossly patent. No large aneurysm is identified. IMPRESSION: 1. Severely motion degraded, incomplete examination. 2. Moderate-sized left MCA infarct primarily involving the posterolateral frontal lobe. 3. Moderately advanced chronic small vessel ischemic disease. 4. Limited MRA due to motion. Patent proximal left M1 segment with obscured distal M1 segment and bifurcation. Multiple missing left MCA branch vessels, particularly in the superior division. Electronically Signed   By: Logan Bores M.D.   On: 06/16/2017 08:21   US Carotid Bilateral (at [facility] And [facility] Only)  Result Date: 06/16/2017 CLINICAL DATA:  Acute left MCA  distribution cerebral infarction. History of hypertension and hyperlipidemia. EXAM: BILATERAL CAROTID DUPLEX ULTRASOUND TECHNIQUE: Pearline Cables scale imaging, color Doppler and duplex ultrasound were performed of bilateral carotid and vertebral arteries in the neck. COMPARISON:  None. FINDINGS: Criteria: Quantification of carotid stenosis is based on velocity parameters that correlate the residual internal carotid diameter with NASCET-based stenosis levels, using the diameter of the distal internal carotid lumen as the denominator for stenosis measurement. The following velocity measurements were obtained: RIGHT ICA:  52/18 cm/sec CCA:  42/59 cm/sec SYSTOLIC ICA/CCA RATIO:  0.8 DIASTOLIC ICA/CCA RATIO:  1.5 ECA:  72 cm/sec LEFT ICA:  60/16 cm/sec CCA:  56/38 cm/sec SYSTOLIC ICA/CCA RATIO:  0.9 DIASTOLIC ICA/CCA RATIO:  1.5 ECA:  131 cm/sec RIGHT CAROTID ARTERY: Minimal partially calcified plaque at the level of the distal bulb and proximal ICA. Velocities and waveforms are normal and estimated right ICA stenosis is less than 50%. The right internal carotid artery is mildly tortuous. RIGHT VERTEBRAL ARTERY: Antegrade flow with normal waveform and velocity. LEFT CAROTID ARTERY: There is a mild amount of eccentric calcified plaque at the level of the carotid bulb and proximal left ICA. Velocities and waveforms are unremarkable and estimated left ICA stenosis is less than 50%. The left ICA is moderately tortuous. LEFT VERTEBRAL ARTERY: Antegrade flow with normal waveform and velocity. IMPRESSION: No significant carotid stenosis identified in the neck bilaterally. Estimated bilateral ICA stenoses are less than 50%. Minimal to mild plaque present, as above. Electronically Signed   By: Aletta Edouard M.D.   On: 06/16/2017 08:54   Dg Chest Port 1 View  Result Date: 06/19/2017 CLINICAL DATA:  Fever EXAM: PORTABLE CHEST 1 VIEW COMPARISON:  None. FINDINGS: There is mild bilateral interstitial thickening. There is no focal  consolidation. There is no pleural effusion or pneumothorax. The heart and mediastinal contours are unremarkable. The osseous structures are unremarkable. IMPRESSION: Bilateral interstitial thickening which may reflect interstitial infection versus mild interstitial edema. Electronically Signed   By: Kathreen Devoid   On: 06/19/2017 10:37   Mr Jodene Nam Head/brain Wo Cm  Result Date: 06/16/2017 CLINICAL DATA:  Difficulty speaking. Unable to write with the right hand. EXAM: MRI HEAD WITHOUT CONTRAST MRA HEAD WITHOUT CONTRAST TECHNIQUE: Multiplanar, multiecho pulse sequences of the brain and surrounding structures were obtained without intravenous contrast. Angiographic images of the head were obtained using MRA technique without contrast. COMPARISON:  Head CT 06/15/2017 and MRI 11/29/2016 FINDINGS: MRI HEAD FINDINGS The patient could not tolerate the complete examination. Sagittal T1, axial and coronal diffusion, and axial T2 sequences were obtained and are motion degraded, severely so on the sagittal T1 sequence. Brain: There is a moderate-sized acute MCA infarct involving the posterior left frontal lobe and perirolandic region. No associated hemorrhage is identified on this limited study. No mass, midline shift, or extra-axial fluid collection is seen. There is mild generalized cerebral atrophy. Chronic lacunar infarcts are present in the deep gray nuclei and deep cerebral white matter bilaterally as well as in the right cerebellum. Patchy to confluent T2 hyperintensities elsewhere in the cerebral white matter bilaterally are similar to the prior MRI and compatible with moderately advanced chronic small vessel ischemic disease. Vascular: Major intracranial vascular flow voids are preserved. Skull and upper cervical spine: No suspicious marrow lesion identified. Sinuses/Orbits: Bilateral cataract extraction. Minimal mucosal thickening in the paranasal sinuses. Clear mastoid air cells. Other: None. MRA HEAD FINDINGS There  is intermittent severe motion artifact. The visualized distal vertebral arteries are patent and codominant. The vertebrobasilar junction and proximal half of the basilar artery are completely obscured by motion artifact. The distal basilar artery is widely patent. Posterior communicating arteries are diminutive or absent. The P1 segments are patent, however artifact results in nondiagnostic evaluation of the more distal PCAs. The horizontal petrous segments of both ICAs are completely obscured by motion artifact. The intracranial ICAs are patent proximal and distal to this without evidence of high-grade stenosis. The proximal left M1 segment is patent, however motion artifact obscures the distal M1 segment  and MCA bifurcation and patency at this level cannot be established. There are multiple missing left MCA branch vessels, particularly in the superior division. The right MCA and both ACAs are grossly patent. No large aneurysm is identified. IMPRESSION: 1. Severely motion degraded, incomplete examination. 2. Moderate-sized left MCA infarct primarily involving the posterolateral frontal lobe. 3. Moderately advanced chronic small vessel ischemic disease. 4. Limited MRA due to motion. Patent proximal left M1 segment with obscured distal M1 segment and bifurcation. Multiple missing left MCA branch vessels, particularly in the superior division. Electronically Signed   By: Logan Bores M.D.   On: 06/16/2017 08:21      Subjective: PT APHASIC BUT INTERACTIVE. SHE DID HAVE A BOWEL MOVEMENT.  Discharge Exam: Vitals:   06/20/17 0741 06/20/17 0830  BP:  (!) 156/90  Pulse:  81  Resp:  16  Temp: 99.1 F (37.3 C) 99.2 F (37.3 C)  SpO2:  91%   Vitals:   06/20/17 0030 06/20/17 0430 06/20/17 0741 06/20/17 0830  BP: (!) 155/58 (!) 161/67  (!) 156/90  Pulse: 72 86  81  Resp: 20 20  16   Temp: 98.6 F (37 C) 97.7 F (36.5 C) 99.1 F (37.3 C) 99.2 F (37.3 C)  TempSrc: Oral Oral Oral Oral  SpO2: 95% 98%   91%  Weight:      Height:       General exam: Lying in the bed, awake, alert, no apparent distress, following simple commands.  Remains aphasic. Respiratory system: Clear. No increased work of breathing. Cardiovascular system: S1 & S2 heard. No JVD, murmurs, gallops, clicks or pedal edema. Gastrointestinal system: Abdomen is nondistended, soft and nontender. Normal bowel sounds heard. Central nervous system: Alert.  Pronounced right hemiparesis involving the right upper extremity, able to move the right lower extremity.  Normal gaze tracking.  Speech, language and comprehension deficits noted. Extremities: no CCE.   The results of significant diagnostics from this hospitalization (including imaging, microbiology, ancillary and laboratory) are listed below for reference.     Microbiology: Recent Results (from the past 240 hour(s))  Culture, blood (Routine X 2) w Reflex to ID Panel     Status: None (Preliminary result)   Collection Time: 06/19/17  8:14 AM  Result Value Ref Range Status   Specimen Description BLOOD LEFT HAND  Final   Special Requests   Final    BOTTLES DRAWN AEROBIC AND ANAEROBIC Blood Culture adequate volume   Culture   Final    NO GROWTH < 24 HOURS Performed at [facility], [address] [place], [place] [address]    Report Status PENDING  Incomplete  Culture, blood (Routine X 2) w Reflex to ID Panel     Status: None (Preliminary result)   Collection Time: 06/19/17  8:15 AM  Result Value Ref Range Status   Specimen Description RIGHT ANTECUBITAL  Final   Special Requests   Final    BOTTLES DRAWN AEROBIC AND ANAEROBIC Blood Culture adequate volume   Culture   Final    NO GROWTH < 24 HOURS Performed at [facility], [address], [place], [place] [address]    Report Status PENDING  Incomplete     Labs: BNP (last 3 results) No results for input(s): BNP in the last 8760 hours. Basic Metabolic Panel: Recent Labs  Lab 06/15/17 1430 06/15/17 1441  06/16/17 0632 06/19/17 0805 06/20/17 0528  NA 141 141 140 137 141  K 3.7 3.5 3.7 4.0 3.6  CL 100* 100* 101  97* 99*  CO2 30  --  31 28 29   GLUCOSE 144* 140* 127* 365* 224*  BUN 17 17 13  27* 32*  CREATININE 0.78 0.80 0.57 0.97 0.86  CALCIUM 9.1  --  8.8* 9.1 9.0   Liver Function Tests: Recent Labs  Lab 06/15/17 1430 06/16/17 0632 06/19/17 0805 06/20/17 0528  AST 22 20 25 22   ALT 17 14 15  13*  ALKPHOS 85 71 77 68  BILITOT 0.7 0.8 1.2 0.5  PROT 7.2 6.4* 7.2 6.6  ALBUMIN 3.6 3.1* 3.2* 2.8*   No results for input(s): LIPASE, AMYLASE in the last 168 hours. No results for input(s): AMMONIA in the last 168 hours. CBC: Recent Labs  Lab 06/15/17 1430 06/15/17 1441 06/19/17 0805 06/20/17 0528  WBC 10.1  --  15.1* 14.2*  NEUTROABS 6.1  --  11.4* 10.4*  HGB 14.3 15.6* 14.0 13.3  HCT 44.3 46.0 43.0 41.3  MCV 87.2  --  86.3 86.6  PLT 264  --  248 257   Cardiac Enzymes: No results for input(s): CKTOTAL, CKMB, CKMBINDEX, TROPONINI in the last 168 hours. BNP: Invalid input(s): POCBNP CBG: Recent Labs  Lab 06/19/17 1058 06/19/17 1206 06/19/17 1620 06/19/17 2146 06/20/17 0740  GLUCAP 484* 384* 308* 259* 211*   D-Dimer No results for input(s): DDIMER in the last 72 hours. Hgb A1c No results for input(s): HGBA1C in the last 72 hours. Lipid Profile No results for input(s): CHOL, HDL, LDLCALC, TRIG, CHOLHDL, LDLDIRECT in the last 72 hours. Thyroid function studies Recent Labs    06/19/17 0805  TSH 0.717   Anemia work up No results for input(s): VITAMINB12, FOLATE, FERRITIN, TIBC, IRON, RETICCTPCT in the last 72 hours. Urinalysis    Component Value Date/Time   COLORURINE YELLOW 06/15/2017 1435   APPEARANCEUR HAZY (A) 06/15/2017 1435   LABSPEC 1.018 06/15/2017 1435   PHURINE 6.0 06/15/2017 1435   GLUCOSEU NEGATIVE 06/15/2017 1435   HGBUR NEGATIVE 06/15/2017 1435   BILIRUBINUR NEGATIVE 06/15/2017 1435   KETONESUR NEGATIVE 06/15/2017 1435   PROTEINUR 30 (A)  06/15/2017 1435   NITRITE NEGATIVE 06/15/2017 1435   LEUKOCYTESUR NEGATIVE 06/15/2017 1435   Sepsis Labs Invalid input(s): PROCALCITONIN,  WBC,  LACTICIDVEN Microbiology Recent Results (from the past 240 hour(s))  Culture, blood (Routine X 2) w Reflex to ID Panel     Status: None (Preliminary result)   Collection Time: 06/19/17  8:14 AM  Result Value Ref Range Status   Specimen Description BLOOD LEFT HAND  Final   Special Requests   Final    BOTTLES DRAWN AEROBIC AND ANAEROBIC Blood Culture adequate volume   Culture   Final    NO GROWTH < 24 HOURS Performed at [facility], [address], [place], [place] [address]    Report Status PENDING  Incomplete  Culture, blood (Routine X 2) w Reflex to ID Panel     Status: None (Preliminary result)   Collection Time: 06/19/17  8:15 AM  Result Value Ref Range Status   Specimen Description RIGHT ANTECUBITAL  Final   Special Requests   Final    BOTTLES DRAWN AEROBIC AND ANAEROBIC Blood Culture adequate volume   Culture   Final    NO GROWTH < 24 HOURS Performed at [facility], [address] [place], [place] [address]    Report Status PENDING  Incomplete   Time coordinating discharge: 40 minutes   SIGNED:  Irwin Brakeman, MD  [facility] 06/20/2017, 11:35 AM Pager [phone]  If  7PM-7AM, please contact night-coverage [email or web address] Password [email or web address]

## 2017-06-20 NOTE — Progress Notes (Signed)
[facility] arrived to transport pt to [facility] 

## 2017-06-20 NOTE — Progress Notes (Signed)
Pt admitted to [facility] via [facility]. Pt has no family at bedside at this time. Pt alert but not responding to questions at this time. Pt does not verbalize pain. Pt was incontinent of urine and changed and oral care was performed. Pt left with call bell within reach. Will await family.

## 2017-06-20 NOTE — Care Management (Addendum)
[facility] called by Network engineer. Medical Necessity done. RN to call report to [phone].

## 2017-06-21 ENCOUNTER — Inpatient Hospital Stay (HOSPITAL_COMMUNITY): Payer: Medicare Other

## 2017-06-21 ENCOUNTER — Ambulatory Visit (HOSPITAL_COMMUNITY): Payer: Medicare Other | Attending: Physical Medicine and Rehabilitation

## 2017-06-21 ENCOUNTER — Inpatient Hospital Stay (HOSPITAL_COMMUNITY): Payer: Medicare Other | Admitting: Speech Pathology

## 2017-06-21 ENCOUNTER — Inpatient Hospital Stay (HOSPITAL_COMMUNITY): Payer: Medicare Other | Admitting: Physical Therapy

## 2017-06-21 DIAGNOSIS — I69391 Dysphagia following cerebral infarction: Secondary | ICD-10-CM

## 2017-06-21 DIAGNOSIS — R609 Edema, unspecified: Secondary | ICD-10-CM | POA: Insufficient documentation

## 2017-06-21 DIAGNOSIS — M7989 Other specified soft tissue disorders: Secondary | ICD-10-CM | POA: Diagnosis not present

## 2017-06-21 DIAGNOSIS — I63511 Cerebral infarction due to unspecified occlusion or stenosis of right middle cerebral artery: Secondary | ICD-10-CM

## 2017-06-21 DIAGNOSIS — I69320 Aphasia following cerebral infarction: Secondary | ICD-10-CM

## 2017-06-21 DIAGNOSIS — G8111 Spastic hemiplegia affecting right dominant side: Secondary | ICD-10-CM

## 2017-06-21 LAB — CBC WITH DIFFERENTIAL/PLATELET
BASOS ABS: 0 10*3/uL (ref 0.0–0.1)
BASOS PCT: 0 %
EOS ABS: 0 10*3/uL (ref 0.0–0.7)
EOS PCT: 0 %
HCT: 40.8 % (ref 36.0–46.0)
Hemoglobin: 13.4 g/dL (ref 12.0–15.0)
Lymphocytes Relative: 9 %
Lymphs Abs: 1 10*3/uL (ref 0.7–4.0)
MCH: 28.5 pg (ref 26.0–34.0)
MCHC: 32.8 g/dL (ref 30.0–36.0)
MCV: 86.6 fL (ref 78.0–100.0)
MONO ABS: 1.4 10*3/uL — AB (ref 0.1–1.0)
Monocytes Relative: 12 %
Neutro Abs: 9.6 10*3/uL — ABNORMAL HIGH (ref 1.7–7.7)
Neutrophils Relative %: 79 %
PLATELETS: 280 10*3/uL (ref 150–400)
RBC: 4.71 MIL/uL (ref 3.87–5.11)
RDW: 14.3 % (ref 11.5–15.5)
WBC: 12.1 10*3/uL — ABNORMAL HIGH (ref 4.0–10.5)

## 2017-06-21 LAB — COMPREHENSIVE METABOLIC PANEL
ALBUMIN: 2.4 g/dL — AB (ref 3.5–5.0)
ALT: 17 U/L (ref 14–54)
AST: 31 U/L (ref 15–41)
Alkaline Phosphatase: 65 U/L (ref 38–126)
Anion gap: 15 (ref 5–15)
BUN: 17 mg/dL (ref 6–20)
CHLORIDE: 101 mmol/L (ref 101–111)
CO2: 29 mmol/L (ref 22–32)
Calcium: 8.7 mg/dL — ABNORMAL LOW (ref 8.9–10.3)
Creatinine, Ser: 0.72 mg/dL (ref 0.44–1.00)
GFR calc Af Amer: >60 mL/min (ref >60)
GFR calc non Af Amer: >60 mL/min (ref >60)
GLUCOSE: 239 mg/dL — AB (ref 65–99)
Potassium: 3.2 mmol/L — ABNORMAL LOW (ref 3.5–5.1)
SODIUM: 145 mmol/L (ref 135–145)
Total Bilirubin: 0.8 mg/dL (ref 0.3–1.2)
Total Protein: 6.6 g/dL (ref 6.5–8.1)

## 2017-06-21 LAB — GLUCOSE, CAPILLARY
GLUCOSE-CAPILLARY: 276 mg/dL — AB (ref 65–99)
GLUCOSE-CAPILLARY: 231 mg/dL — AB (ref 65–99)
Glucose-Capillary: 193 mg/dL — ABNORMAL HIGH (ref 65–99)
Glucose-Capillary: 281 mg/dL — ABNORMAL HIGH (ref 65–99)

## 2017-06-21 MED ORDER — POTASSIUM CHLORIDE CRYS ER 20 MEQ PO TBCR: 20.0000 meq | EXTENDED_RELEASE_TABLET | ORAL | Status: DC

## 2017-06-21 MED ORDER — RESOURCE THICKENUP CLEAR PO POWD
ORAL | Status: DC | PRN
  Administered 2017-06-27: 13:00:00 via ORAL
  Filled 2017-06-21: qty 125

## 2017-06-21 MED ORDER — POTASSIUM CHLORIDE 20 MEQ/15ML (10%) PO SOLN
20.0000 meq | Freq: Once | ORAL | Status: AC
  Administered 2017-06-21: 20 meq via ORAL
  Filled 2017-06-21: qty 15

## 2017-06-21 MED ORDER — ASPIRIN EC 325 MG PO TBEC
325.0000 mg | DELAYED_RELEASE_TABLET | Freq: Every day | ORAL | Status: DC
  Administered 2017-06-22 – 2017-07-14 (×23): 325 mg via ORAL
  Filled 2017-06-21 (×24): qty 1

## 2017-06-21 MED ORDER — ORAL CARE MOUTH RINSE
15.0000 mL | Freq: Two times a day (BID) | OROMUCOSAL | Status: DC
  Administered 2017-06-21 – 2017-07-14 (×46): 15 mL via OROMUCOSAL

## 2017-06-21 MED ORDER — POTASSIUM CHLORIDE CRYS ER 20 MEQ PO TBCR
20.0000 meq | EXTENDED_RELEASE_TABLET | Freq: Two times a day (BID) | ORAL | Status: AC
  Administered 2017-06-21 – 2017-06-23 (×5): 20 meq via ORAL
  Filled 2017-06-21 (×5): qty 1

## 2017-06-21 NOTE — Progress Notes (Signed)
Subjective/Complaints: Aphasic, takes meds in applesauce  ROS- cannot obtain due to aphasia  Objective: Vital Signs: Blood pressure (!) 198/69, pulse 94, temperature 98 F (36.7 C), temperature source Oral, resp. rate 18, height 5\' 5"  (1.651 m), weight 88 kg (193 lb 14.4 oz), SpO2 95 %. Dg Chest 2 View  Result Date: 06/20/2017 CLINICAL DATA:  Stroke.  Concerns of aspiration or pneumonia. EXAM: CHEST  2 VIEW COMPARISON:  June 19, 2017 FINDINGS: Stable cardiomegaly. The hila and mediastinum are unchanged. No pulmonary nodules or masses. No focal infiltrates. Suggested pulmonary venous congestion. IMPRESSION: No focal infiltrate.  Pulmonary venous congestion. Electronically Signed   By: Dorise Bullion III M.D   On: 06/20/2017 19:23   Dg Chest Port 1 View  Result Date: 06/19/2017 CLINICAL DATA:  Fever EXAM: PORTABLE CHEST 1 VIEW COMPARISON:  None. FINDINGS: There is mild bilateral interstitial thickening. There is no focal consolidation. There is no pleural effusion or pneumothorax. The heart and mediastinal contours are unremarkable. The osseous structures are unremarkable. IMPRESSION: Bilateral interstitial thickening which may reflect interstitial infection versus mild interstitial edema. Electronically Signed   By: Kathreen Devoid   On: 06/19/2017 10:37   Dg Swallowing Func-speech Pathology  Result Date: 06/20/2017 Objective Swallowing Evaluation: Type of Study: MBS-Modified Barium Swallow Study  Patient Details Name: Sandra [name] MRN: [id number] Date of Birth: November 19, 1940 Today's Date: 06/20/2017 Time: SLP Start Time (ACUTE ONLY): 1115 -SLP Stop Time (ACUTE ONLY): 1145 SLP Time Calculation (min) (ACUTE ONLY): 30 min Past Medical History: Past Medical History: Diagnosis Date . Colon polyp  . Constipation  . Diabetes mellitus without complication ([place])  . GI bleed  . Hemorrhoids  . High cholesterol  . Hypertension  . PAD (peripheral artery disease) ([place])  . Peripheral neuropathy  . Rectal bleeding    "for 7 years" . Rectal bleeding  . Stroke [facility])  Past Surgical History: Past Surgical History: Procedure Laterality Date . ABDOMINAL HYSTERECTOMY   . COLONOSCOPY    X 7 . COLONOSCOPY N/A 03/21/2017  Procedure: COLONOSCOPY;  Surgeon: Danie Binder, MD;  Location: [facility];  Service: Endoscopy;  Laterality: N/A;  1215  HPI: 77 y.o.female,w hypertension, hyperlipidemia, Dm2, CVA, apparently not taking bp medication for the past 2 weeks presents with dysarthria and aphasia. Pt is not taking aspirin at home. Per teleneurology last known well 1200 on 06/14/2017. Pt has difficulty with comprehension as well it appears.The patient was noted to have an acute CVA by MRI study. Dr. Merlene Laughter notes: The MRI shows a faintly increased signal on DWI involving the left frontal and temporal region. The faintness suggests more of a subacute onset and not in acute event. Pt passed RN swallow screen and SLE ordered as part of stroke protocol. Pt's son is a physical therapist here at [facility].  Subjective: Pt nonverbal today Assessment / Plan / Recommendation CHL IP CLINICAL IMPRESSIONS 06/20/2017 Clinical Impression Pt presents with moderate/severe oral phase and mild/mod pharyngeal phase dysphagia characterized by right buccal and labial weakness resulting in decreased labial closure, decreased bolus cohesiveness, reduced anterior-posterior transit, impaired mastication and bolus manipulation, prolonged oral phase, premature spillage with liquids with swallow initiation at the level of the valleculae and spilling to pyriforms at times; Pt with trace penetration of thins during the swallow without frank aspiration observed. Pt also noted to have min vallecular residue and mild/mod right pyriform residue with most textures and consistencies. Pt unable to follow directions for compensatory strategies at this time due to severity of  cognitive linguistic deficits (head turn, repeat swallows, etc), however dry spoon presentation  to oral cavity does help elicit dry swallow which is effective in clearing most but not all pharyngeal residue. Liquids were presented via tsp, cup, and straw due to significant right labial spillage. Pt unable to masticate graham cracker efficiently and was given applesauce to help, which resulted in eventual swallow but continued oral residue. Recommend continuing D1/puree with NTL via tsp, cup, straw with assist and pills whole in puree and follow with liquid wash. Pt could begin trials of upgraded textures and thin water when working 1:1 with SLP clinically. Pt will likely need a repeat objective assessment as clinical picture improves. Above discussed with RN and Pt's son [name], PT).  SLP Visit Diagnosis Dysphagia, oropharyngeal phase (R13.12) Attention and concentration deficit following -- Frontal lobe and executive function deficit following -- Impact on safety and function --   CHL IP TREATMENT RECOMMENDATION 06/20/2017 Treatment Recommendations Therapy as outlined in treatment plan below   Prognosis 06/20/2017 Prognosis for Safe Diet Advancement Fair Barriers to Reach Goals Language deficits;Severity of deficits Barriers/Prognosis Comment -- CHL IP DIET RECOMMENDATION 06/20/2017 SLP Diet Recommendations Dysphagia 1 (Puree) solids;Nectar thick liquid Liquid Administration via Spoon;Cup;Straw Medication Administration Whole meds with puree Compensations Minimize environmental distractions;Slow rate;Small sips/bites;Lingual sweep for clearance of pocketing Postural Changes Remain semi-upright after after feeds/meals (Comment);Seated upright at 90 degrees   CHL IP OTHER RECOMMENDATIONS 06/20/2017 Recommended Consults -- Oral Care Recommendations Oral care before and after PO;Staff/trained caregiver to provide oral care Other Recommendations Clarify dietary restrictions   CHL IP FOLLOW UP RECOMMENDATIONS 06/20/2017 Follow up Recommendations Inpatient Rehab   CHL IP FREQUENCY AND DURATION 06/20/2017 Speech Therapy  Frequency (ACUTE ONLY) min 2x/week Treatment Duration 1 week      CHL IP ORAL PHASE 06/20/2017 Oral Phase Impaired Oral - Pudding Teaspoon -- Oral - Pudding Cup -- Oral - Honey Teaspoon -- Oral - Honey Cup -- Oral - Nectar Teaspoon WFL Oral - Nectar Cup Right anterior bolus loss;Weak lingual manipulation;Lingual pumping;Lingual/palatal residue;Delayed oral transit;Decreased bolus cohesion Oral - Nectar Straw Lingual pumping;Lingual/palatal residue;Delayed oral transit Oral - Thin Teaspoon Premature spillage;Lingual/palatal residue Oral - Thin Cup Right anterior bolus loss;Premature spillage;Decreased bolus cohesion;Lingual/palatal residue Oral - Thin Straw Premature spillage;Lingual/palatal residue Oral - Puree Weak lingual manipulation;Reduced posterior propulsion;Right pocketing in lateral sulci;Lingual/palatal residue;Piecemeal swallowing;Delayed oral transit;Decreased bolus cohesion Oral - Mech Soft Weak lingual manipulation;Reduced posterior propulsion;Right pocketing in lateral sulci;Lingual/palatal residue;Piecemeal swallowing;Delayed oral transit;Decreased bolus cohesion;Impaired mastication Oral - Regular Weak lingual manipulation;Reduced posterior propulsion;Right pocketing in lateral sulci;Lingual/palatal residue;Piecemeal swallowing;Delayed oral transit;Decreased bolus cohesion;Impaired mastication Oral - Multi-Consistency -- Oral - Pill (No Data) Oral Phase - Comment --  CHL IP PHARYNGEAL PHASE 06/20/2017 Pharyngeal Phase Impaired Pharyngeal- Pudding Teaspoon -- Pharyngeal -- Pharyngeal- Pudding Cup -- Pharyngeal -- Pharyngeal- Honey Teaspoon -- Pharyngeal -- Pharyngeal- Honey Cup -- Pharyngeal -- Pharyngeal- Nectar Teaspoon -- Pharyngeal -- Pharyngeal- Nectar Cup Delayed swallow initiation-vallecula;Delayed swallow initiation-pyriform sinuses;Pharyngeal residue - valleculae;Pharyngeal residue - pyriform Pharyngeal -- Pharyngeal- Nectar Straw Delayed swallow initiation-pyriform sinuses;Delayed swallow  initiation-vallecula;Pharyngeal residue - valleculae;Pharyngeal residue - pyriform Pharyngeal -- Pharyngeal- Thin Teaspoon Delayed swallow initiation-vallecula;Pharyngeal residue - pyriform Pharyngeal -- Pharyngeal- Thin Cup Delayed swallow initiation-vallecula;Penetration/Aspiration during swallow;Pharyngeal residue - valleculae;Pharyngeal residue - pyriform Pharyngeal Material does not enter airway;Material enters airway, remains ABOVE vocal cords then ejected out;Material enters airway, remains ABOVE vocal cords and not ejected out Pharyngeal- Thin Straw Pharyngeal residue - pyriform;Penetration/Aspiration during swallow;Delayed swallow initiation-pyriform sinuses Pharyngeal Material does not  enter airway;Material enters airway, remains ABOVE vocal cords then ejected out;Material enters airway, remains ABOVE vocal cords and not ejected out Pharyngeal- Puree Pharyngeal residue - valleculae;Pharyngeal residue - pyriform Pharyngeal -- Pharyngeal- Mechanical Soft Pharyngeal residue - valleculae;Pharyngeal residue - pyriform Pharyngeal -- Pharyngeal- Regular -- Pharyngeal -- Pharyngeal- Multi-consistency -- Pharyngeal -- Pharyngeal- Pill WFL Pharyngeal -- Pharyngeal Comment --  CHL IP CERVICAL ESOPHAGEAL PHASE 06/20/2017 Cervical Esophageal Phase WFL Pudding Teaspoon -- Pudding Cup -- Honey Teaspoon -- Honey Cup -- Nectar Teaspoon -- Nectar Cup -- Nectar Straw -- Thin Teaspoon -- Thin Cup -- Thin Straw -- Puree -- Mechanical Soft -- Regular -- Multi-consistency -- Pill -- Cervical Esophageal Comment -- Thank you, Genene Churn, [place] No flowsheet data found. PORTER,DABNEY 06/20/2017, 2:13 PM              Results for orders placed or performed during the hospital encounter of 06/20/17 (from the past 72 hour(s))  Glucose, capillary     Status: Abnormal   Collection Time: 06/20/17  5:16 PM  Result Value Ref Range   Glucose-Capillary 151 (H) 65 - 99 mg/dL  Glucose, capillary     Status: Abnormal    Collection Time: 06/20/17  9:22 PM  Result Value Ref Range   Glucose-Capillary 115 (H) 65 - 99 mg/dL  Glucose, capillary     Status: Abnormal   Collection Time: 06/21/17  6:51 AM  Result Value Ref Range   Glucose-Capillary 193 (H) 65 - 99 mg/dL     HEENT: normal Cardio: RRR and no murmur Resp: CTA B/L and unlabored GI: BS positive and NT, ND Extremity:  Pulses positive and No Edema Skin:   Other IV site CDI Neuro: Flat, Abnormal Sensory no withdrawl to pinch in RLE, Abnormal Motor 0/5 in RUE adn RLE, Aphasic and Inattention Musc/Skel:  Other flexor withdrawl LLE Gen NAD   Assessment/Plan: 1. Functional deficits secondary to Left MCA infarct with R HP, Aphasia which require 3+ hours per day of interdisciplinary therapy in a comprehensive inpatient rehab setting. Physiatrist is providing close team supervision and 24 hour management of active medical problems listed below. Physiatrist and rehab team continue to assess barriers to discharge/monitor patient progress toward functional and medical goals. FIM:       Function - Toileting Toileting steps completed by helper: Adjust clothing prior to toileting, Performs perineal hygiene, Adjust clothing after toileting Assist level: Touching or steadying assistance (Pt.75%)  Function - Toilet Transfers Toilet transfer activity did not occur: Safety/medical concerns        Function - Comprehension Comprehension: Auditory Comprehension assist level: Understands basic 25 - 49% of the time/ requires cueing 50 - 75% of the time  Function - Expression Expression: Verbal Expression assist level: Expresses basis less than 25% of the time/requires cueing >75% of the time.  Function - Social Interaction Social Interaction assist level: Interacts appropriately less than 25% of the time. May be withdrawn or combative.  Function - Problem Solving Problem solving assist level: Solves basic less than 25% of the time - needs direction  nearly all the time or does not effectively solve problems and may need a restraint for safety  Function - Memory Memory assist level: Recognizes or recalls less than 25% of the time/requires cueing greater than 75% of the time Patient normally able to recall (first 3 days only): That he or she is in a hospital  Medical Problem List and Plan: 1. Functional deficits secondary to  due to [facility] stroke with  extension - Aphasia with Right spastic hemiplegia Start CIR PT, OT, SLP  2.  DVT Prophylaxis/Anticoagulation: Pharmaceutical: Lovenox 3. Pain Management: tylenol prn 4. Mood: LCSW to follow for evaluation when appropriate.  5. Neuropsych: This patient is not capable of making decisions on her own behalf. 6. Skin/Wound Care: Routine pressure relief measures. Maintain adequate nutritional and hydration status.  7. Fluids/Electrolytes/Nutrition: Monitor I/O. Offer supplements prn if intake is poor. Will check lytes in am. 8. T2DM: Hgb A1C- Monitor BS ac/hs and use SSI for elevated BS. Continue to titrate medications for tighter control.  CBG (last 3)  Recent Labs    06/20/17 1716 06/20/17 2122 06/21/17 0651  GLUCAP 151* 115* 193*  on Lantus 24U qhs, on metformin at home, 8U novalog with meals 9. HTN: Poorly controlled due to compliance issues.  Vitals:   06/20/17 2203 06/21/17 0402  BP: (!) 182/67 (!) 198/69  Pulse: 94 94  Resp: 18 18  Temp: 98 F (36.7 C) 98 F (36.7 C)  SpO2: 92% 95%  ~7d post CVA, start low dose amlodipine 10. Fever with leucocytosis: Continue Unasyn D# 3.  11. Dysphagia: Continue dysphagia 1, nectar liquids with assistance at meals to help maintain adequate hydration.  12. Recent episode of  Depression: Now onCelexa last month.   13. Obesity             Body mass index is 32.91 kg/m.             Diet and exercise education             Will cont to encourage weight loss to increase endurance and promote overall health    LOS (Days) 1 A FACE TO FACE  EVALUATION WAS PERFORMED  Charlett Blake 06/21/2017, 8:03 AM

## 2017-06-21 NOTE — Plan of Care (Signed)
Incontinent with bladder

## 2017-06-21 NOTE — Evaluation (Signed)
Physical Therapy Assessment and Plan  Patient Details  Name: Sandra [name] MRN: [id number] Date of Birth: 09/08/1940  PT Diagnosis: Abnormal posture, Abnormality of gait, Coordination disorder, Hemiparesis dominant, Impaired cognition and Muscle weakness Rehab Potential: Fair ELOS: 28-32 days   Today's Date: 06/21/2017 PT Individual Time: 1530-1630 PT Individual Time Calculation (min): 60 min    Problem List:  Patient Active Problem List   Diagnosis Date Noted  . Acute ischemic right MCA stroke (Sandra [name]) 06/20/2017  . Acute ischemic stroke (Sandra [name])   . Diabetes mellitus type 2 in obese (Sandra [name])   . Benign essential HTN   . Noncompliance   . FUO (fever of unknown origin)   . Leukocytosis   . Dysphagia, post-stroke   . TIA (transient ischemic attack) 06/15/2017  . Hyperlipidemia associated with type 2 diabetes mellitus (Sandra [name]) 06/14/2017  . Type 2 diabetes mellitus with neurological complications (Sandra [name]) 24/58/0998  . Stroke (Sandra [name]) 03/31/2017  . History of adenomatous polyp of colon 02/11/2017  . Dysphagia 11/19/2016  . Essential hypertension 08/12/2016  . Hemorrhoids 06/16/2016  . Rectal bleeding 06/16/2016  . Constipation 06/16/2016    Past Medical History:  Past Medical History:  Diagnosis Date  . Colon polyp   . Constipation   . Diabetes mellitus without complication ([name])   . GI bleed   . Hemorrhoids   . High cholesterol   . Hypertension   . PAD (peripheral artery disease) ([name])   . Peripheral neuropathy   . Rectal bleeding    "for 7 years"  . Rectal bleeding   . Stroke [name])    Past Surgical History:  Past Surgical History:  Procedure Laterality Date  . ABDOMINAL HYSTERECTOMY    . COLONOSCOPY     X 7  . COLONOSCOPY N/A 03/21/2017   Procedure: COLONOSCOPY;  Surgeon: Danie Binder, MD;  [facility];  Service: Endoscopy;  Laterality: N/A;  1215     Assessment & Plan Clinical Impression: A 77 y.o. female with history of HTN, T2DM, diverticulosis, recent  issues with depression and had stopped taking all medication except insulin for few weeks. She was admitted to [name] on 06/15/17 with elevated BP, confusion and 2 week history of dysarthria. 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  MRI/MRA brain done revealing moderate size L-MCA infarct affecting posterior lateral frontal lobe and multiple missing L-MCA branch vessels and moderately advanced chronic SVD.  2D echo showed EF 60-65% with no wall abnormality and mildly calcified MV and AV. Carotid dopplers with < 50% ICA stenosis. Swallow evaluation done and patient placed on dysphagia 1, nectar liquids with water protocol.   She had acute changes in mental status with lethargy, minimal verbal output and CT head repeated 02/1 and was negative for bleed but she developed fever 101.4 with and leucocytosis on early am of 2/2.  Blood cultures done for work up and she was started on Unasyn due to concerns of aspiration PNA. She has had increase in right facial weakness and RUE weakness and MBS repeated 02/4 due to functional decline with inability to elicit cough.     Patient with resultant dense right hemiparesis, expressive/receptive aphasia, dysphagia and MD and Rehab CM reporting that patient much improved this am and she was cleared medically to start CIR program.  Patient transferred to CIR on 06/20/2017 .   Patient currently requires total to +2  assist with mobility secondary to muscle weakness and muscle paralysis, abnormal tone, unbalanced muscle activation, motor apraxia and decreased coordination, decreased visual perceptual skills and decreased visual motor skills, decreased attention to right and decreased motor planning, decreased initiation, decreased attention, decreased awareness, decreased problem solving, decreased safety  awareness, decreased memory and delayed processing and decreased sitting balance, decreased standing balance, decreased postural control, hemiplegia and decreased balance strategies.  Prior to hospitalization, patient was modified independent  with mobility and lived with Family, Son in a House home.  Home access is 4Stairs to enter.  Patient will benefit from skilled PT intervention to maximize safe functional mobility, minimize fall risk and decrease caregiver burden for planned discharge home with 24 hour assist.  Anticipate patient will potentially require continued therapy at SNF level at discharge, if family unable to provide mod/max level assist 24/7.  PT - End of Session Activity Tolerance: Decreased this session Endurance Deficit: Yes PT Assessment Rehab Potential (ACUTE/IP ONLY): Fair PT Barriers to Discharge: Inaccessible home environment;Decreased caregiver support;Incontinence;Insurance for SNF coverage PT Patient demonstrates impairments in the following area(s): Balance;Endurance;Motor;Perception;Safety;Sensory;Skin Integrity PT Transfers Functional Problem(s): Bed Mobility;Bed to Chair;Car;Furniture PT Locomotion Functional Problem(s): Ambulation;Wheelchair Mobility;Stairs PT Plan PT Intensity: Minimum of 1-2 x/day ,45 to 90 minutes PT Frequency: 5 out of 7 days PT Duration Estimated Length of Stay: 28-32 days PT Treatment/Interventions: Ambulation/gait training;Community reintegration;DME/adaptive equipment instruction;Neuromuscular re-education;Psychosocial support;Stair training;UE/LE Strength taining/ROM;Wheelchair propulsion/positioning;UE/LE Coordination activities;Therapeutic Activities;Functional electrical stimulation;Discharge planning;Balance/vestibular training;Cognitive remediation/compensation;Functional mobility training;Patient/family education;Therapeutic Exercise;Splinting/orthotics;Visual/perceptual remediation/compensation PT Transfers Anticipated Outcome(s): mod  assist PT Locomotion Anticipated Outcome(s): max assist with PT for ambulation, supervision for w/c  PT Recommendation Follow Up Recommendations: 24 hour supervision/assistance;Home health PT (will need SNF placement if family unable to provide heavy assist anticipated) Patient destination: (tbd) Equipment Recommended: To be determined  Skilled Therapeutic Intervention No indication of pain at start of session, however pt perseverating on pulling on her brief, and during hygiene noted to have open/raw area underneath pannus at midline.  Session focus on initial PT assessment.  Pt with significant impairments in cognition and language which limit safe mobility with +1 assist.  Pt requires increased time, repetitions, and max multimodal cues for rolling to R, able to complete with max assist by end of session.  Pt continues to require total assist for roll to L and scoot to [facility] with bed in [place].  PT provided pt with tilt in space w/c for out of bed activity, however deferred this session 2/2 BP in supine 190/61.  Left semi recumbent in bed with call bell in reach and needs met.   PT Evaluation Precautions/Restrictions Precautions Precautions: Fall Precaution Comments: dense R hemiparesis, R inattention Restrictions Other Position/Activity Restrictions: Positioning of right upper extremity in sight of patient and not pulling on it as it is flaccid.  Pain Pain Assessment Pain Assessment: No/denies pain Faces Pain Scale: Hurts little more Pain Type: Acute pain Pain Location: Abdomen(noted to have open, raw area under pannus at midline) Pain Descriptors / Indicators: Other (Comment) Pain Intervention(s): Repositioned Home Living/Prior Functioning (from chart review, unable to obtain from pt 2/2 aphasia) Home Living Available Help at Discharge: Family Type of Home: House Home Access: Stairs to enter [employer] of Steps: 4 Entrance Stairs-Rails: None Home Layout: Two  level;Laundry or work area in basement;Able to live on main level with bedroom/bathroom Alternate Level Stairs-Number of Steps: 20 steps to second level; 12 to basement Bathroom Shower/Tub: Chiropodist: Standard  Lives With: Family;Son Prior Function Level  of Independence: Independent with gait;Independent with transfers  Able to Take Stairs?: Yes Driving: Yes Comments: no AD for HH distances, [place] for short community distances Vision/Perception  Vision - Assessment Additional Comments: unable to formally assess 2/2 cognitive deficits Perception Perception: Impaired Inattention/Neglect: Does not attend to right side of body;Does not attend to right visual field Spatial Orientation: impaired Praxis Praxis: Impaired Praxis Impairment Details: Initiation;Motor planning;Ideomotor;Perseveration Praxis-Other Comments: difficulty with intitation/ideation of grooming task  Cognition Overall Cognitive Status: Impaired/Different from baseline Arousal/Alertness: Awake/alert Orientation Level: (does not respond to name, unable to answer orientation questions 2/2 aphasia) Attention: Sustained Sustained Attention: Impaired Sustained Attention Impairment: Verbal basic;Functional basic Awareness: Impaired Problem Solving: Impaired Behaviors: Perseveration Safety/Judgment: Impaired Comments: right inattention Sensation Sensation Light Touch: (unable to formally assess) Proprioception: Impaired by gross assessment Coordination Gross Motor Movements are Fluid and Coordinated: No Fine Motor Movements are Fluid and Coordinated: No Motor  Motor Motor: Hemiplegia;Motor apraxia Motor - Skilled Clinical Observations: dense R hemiparesis  Mobility Bed Mobility Bed Mobility: Rolling Right;Rolling Left;Scooting to [facility] Rolling Right: 2: Max assist;With rail Rolling Right Details: Tactile cues for initiation;Tactile cues for placement;Visual cues for safe use of DME/AD;Visual  cues/gestures for sequencing;Verbal cues for sequencing;Verbal cues for technique;Verbal cues for precautions/safety;Verbal cues for safe use of DME/AE;Manual facilitation for weight shifting Rolling Left: 1: +1 Total assist Rolling Left Details: Manual facilitation for weight shifting;Manual facilitation for placement;Verbal cues for safe use of DME/AE;Verbal cues for technique;Verbal cues for precautions/safety;Verbal cues for sequencing;Visual cues/gestures for sequencing;Visual cues for safe use of DME/AE;Visual cues/gestures for precautions/safety;Tactile cues for initiation;Tactile cues for placement;Tactile cues for sequencing Scooting to HOB: 1: +1 Total assist;Other (comment)(draw sheet, [place]) Transfers Transfers: No Locomotion  Ambulation Ambulation: No Gait Gait: No Stairs / Additional Locomotion Stairs: No Wheelchair Mobility Wheelchair Mobility: No  Trunk/Postural Assessment  Cervical Assessment Cervical Assessment: Exceptions to WFL(L gaze preference) Thoracic Assessment Thoracic Assessment: Exceptions to WFL(rounded shoulders) Lumbar Assessment Lumbar Assessment: Exceptions to WFL(posterior pelvic tilt) Postural Control Postural Control: Deficits on evaluation(delayed/absent: pt able to reach to L when leaning R to grab bed rail remain upright )  Balance Balance Balance Assessed: No Static Sitting Balance Static Sitting - Level of Assistance: 2: Max assist Static Sitting - Comment/# of Minutes: Pt able to initailly maintain midline ~20 seconds before leaning R requiring MAX A  to maintain midline; at end of sitting EOB, pt pushing with LUE  Dynamic Sitting Balance Dynamic Sitting - Level of Assistance: 1: +1 Total assist Reach (Patient is able to reach ___ inches to right, left, forward, back): dressing Extremity Assessment  RUE Assessment RUE Assessment: Exceptions to WFL(dense hemi; no activation noted; full PROM) LUE Assessment LUE Assessment: Within  Functional Limits RLE PROM (degrees) RLE Overall PROM Comments: appears WFL RLE Strength RLE Overall Strength Comments: no attempts noted for active movement despite encouragement LLE PROM (degrees) LLE Overall PROM Comments: appears WFL, though noted grimace with end range hip/knee flexion LLE Strength LLE Overall Strength Comments: does not attempt to move LLE, appears to resist PROM   See Function Navigator for Current Functional Status.   Refer to Care Plan for Long Term Goals  Recommendations for other services: None   Discharge Criteria: Patient will be discharged from PT if patient refuses treatment 3 consecutive times without medical reason, if treatment goals not met, if there is a change in medical status, if patient makes no progress towards goals or if patient is discharged from hospital.  The above assessment, treatment plan, treatment  alternatives and goals were discussed and mutually agreed upon: No family available/patient unable  Michel Santee 06/21/2017, 5:15 PM

## 2017-06-21 NOTE — Progress Notes (Signed)
Social Work  Social Work Assessment and Plan  Patient Details  Name: Sandra [name] MRN: [id number] Date of Birth: [birth year]  Today's Date: 06/21/2017  Problem List:  Patient Active Problem List   Diagnosis Date Noted  . Acute ischemic right MCA stroke ([place]) 06/20/2017  . Acute ischemic stroke ([place])   . Diabetes mellitus type 2 in obese ([place])   . Benign essential HTN   . Noncompliance   . FUO (fever of unknown origin)   . Leukocytosis   . Dysphagia, post-stroke   . TIA (transient ischemic attack) 06/15/2017  . Hyperlipidemia associated with type 2 diabetes mellitus ([place]) 06/14/2017  . Type 2 diabetes mellitus with neurological complications ([place]) 33/29/5188  . Stroke ([place]) 03/31/2017  . History of adenomatous polyp of colon 02/11/2017  . Dysphagia 11/19/2016  . Essential hypertension 08/12/2016  . Hemorrhoids 06/16/2016  . Rectal bleeding 06/16/2016  . Constipation 06/16/2016   Past Medical History:  Past Medical History:  Diagnosis Date  . Colon polyp   . Constipation   . Diabetes mellitus without complication ([place])   . GI bleed   . Hemorrhoids   . High cholesterol   . Hypertension   . PAD (peripheral artery disease) ([place])   . Peripheral neuropathy   . Rectal bleeding    "for 7 years"  . Rectal bleeding   . Stroke [facility])    Past Surgical History:  Past Surgical History:  Procedure Laterality Date  . ABDOMINAL HYSTERECTOMY    . COLONOSCOPY     X 7  . COLONOSCOPY N/A 03/21/2017   Procedure: COLONOSCOPY;  Surgeon: Danie Binder, MD;  Location[facility];  Service: Endoscopy;  Laterality: N/A;  1215    Social History:  reports that  has never smoked. she has never used smokeless tobacco. She reports that she does not drink alcohol or use drugs.  Family / Support Systems Marital Status: Widow/Widower Patient Roles: Parent, Other (Comment)(sibling and grandmother) Children: Sandra [name] [phone]  six other children-3 in [place], 2 in [place] and 2 in [place] Other  Supports: Sandra [name]-daughter in-law [phone]    Anticipated Caregiver: Sandra [name] and Sandra [name] Ability/Limitations of Caregiver: Daughter in -law doesn't work, Sandra [name] is a PT at [employer]. Daughter in-law is small in stature can not provide physical care Caregiver Availability: Other (Comment)(Depends upon how much physical assist she will need) Family Dynamics: Close knit with all of her children, pt was doing well prior to admission, with her eating and was losing weight before this stroke. Son and daughter in-law moved to [place] 2 months ago to assist with pt's care.  Pt was not doing well at her own home.  Social History Preferred language: English Religion: Baptist Cultural Background: No issues Education: [employer] Read: Yes Write: Yes Employment Status: Retired Freight forwarder Issues: No issues Guardian/Conservator: None-according to MD pt is not fully capable of making her own decisions while here. Will look toward her son to make any decisions while here and he will communicate with the rest of his siblings.   Abuse/Neglect Abuse/Neglect Assessment Can Be Completed: Yes Physical Abuse: Denies Verbal Abuse: Denies Sexual Abuse: Denies Exploitation of patient/patient's resources: Denies Self-Neglect: Denies  Emotional Status Pt's affect, behavior adn adjustment status: Pt is hoping to do well here and make good progress. Daughter in-law reports she was doing well and walking beofre her stroke progressed and got worse. She is quite sleepy and is trying to stay awake for therapies today. She has always been the caregiver  fo others and now needs help herself., a role she is not used too. Recent Psychosocial Issues: Other health issues-BP issues and family not aware she had stopped taking her meds, due to antidepressant and new BP pill. Couldn't tell which one was making her sick. Family feels strongly she will take her medications. Pyschiatric History: Was having  depression due to multiple losses of family members and her 56 yo niece really affected her. Will ask neuro-psych to see while here for coping. Substance Abuse History: No issues  Patient / Family Perceptions, Expectations & Goals Pt/Family understanding of illness & functional limitations: Daughter in-law and son can explain her stroke and deficits. Son is a PT at [employer]. Pt has a basic understanding but now worse unsure she has speech deficits. Family talks with MD daily and feel they have a good understanding and treatment plan moving forward. Premorbid pt/family roles/activities: Grandmother, sibling, Mother in-law, retiree, church member, etc Anticipated changes in roles/activities/participation: resume Pt/family expectations/goals: Pt is not able to communicate due to speech issues from stroke. Sandra [name] states: " I hope she does well here, I can't lift her I am too small."    [employer]: None Premorbid Home Care/DME Agencies: None Transportation available at discharge: Family pt did not drive prior to admission Resource referrals recommended: Neuropsychology, Support group (specify)  Discharge Planning Living Arrangements: Children, Other relatives Support Systems: Children, Other relatives, Friends/neighbors, Church/faith community Type of Residence: Private residence Insurance Resources: [employer], Multimedia programmer (specify)([employer]) Museum/gallery curator Resources: Radio broadcast assistant Screen Referred: No Living Expenses: Lives with family Money Management: Family Does the patient have any problems obtaining your medications?: No Home Management: Sandra [name] does the home management Patient/Family Preliminary Plans: Depends upon how much progress and what level pt is at discharge. Family would lioke to be able to take her home, but Sandra [name] will need to be able to take care of ehr sicne husband works during the day. Sandra [name] also takes care of her two children 9 & 12. Sw  Barriers to Discharge: Decreased caregiver support [place] Barriers to Discharge Comments: Sandra [name] is of small stature and pt is a large woman Social Work Anticipated Follow Up Needs: HH/OP, SNF, Support Group  Clinical Impression Very lethargic and tired pt who slept throughout my interview. Sandra [name]-daughter in-law supplied the information for the assessment. Son and daughter in-law moved to [place] two months ago from [place] to assist pt with her care and provide support. Pt does have six other children located in three different states. Son and daughter in-law want to be able to take pt home upon discharge from rehab but will need to be able to provide the care she requires. Will await team's evaluations and work on a safe plan. Pt would benefit from seeing neuro-psych while here due to her depression prior to admission. Work on realistic discharge plan.  Elease Hashimoto 06/21/2017, 11:56 AM

## 2017-06-21 NOTE — Evaluation (Signed)
Occupational Therapy Assessment and Plan  Patient Details  Name: Sandra [name] MRN: [id number] Date of Birth: 1940-08-19  OT Diagnosis: abnormal posture, apraxia, cognitive deficits, flaccid hemiplegia and hemiparesis, hemiplegia affecting dominant side, muscle weakness (generalized), swelling of limb and coordination disorder Rehab Potential: Rehab Potential (ACUTE ONLY): Good ELOS: 21-28 days   Today's Date: 06/21/2017 OT Individual Time: 2035-5974 OT Individual Time Calculation (min): 75 min     Problem List:  Patient Active Problem List   Diagnosis Date Noted  . Acute ischemic right MCA stroke ([place]) 06/20/2017  . Acute ischemic stroke ([place])   . Diabetes mellitus type 2 in obese ([place])   . Benign essential HTN   . Noncompliance   . FUO (fever of unknown origin)   . Leukocytosis   . Dysphagia, post-stroke   . TIA (transient ischemic attack) 06/15/2017  . Hyperlipidemia associated with type 2 diabetes mellitus ([place]) 06/14/2017  . Type 2 diabetes mellitus with neurological complications ([place]) 16/38/4536  . Stroke ([place]) 03/31/2017  . History of adenomatous polyp of colon 02/11/2017  . Dysphagia 11/19/2016  . Essential hypertension 08/12/2016  . Hemorrhoids 06/16/2016  . Rectal bleeding 06/16/2016  . Constipation 06/16/2016    Past Medical History:  Past Medical History:  Diagnosis Date  . Colon polyp   . Constipation   . Diabetes mellitus without complication ([place])   . GI bleed   . Hemorrhoids   . High cholesterol   . Hypertension   . PAD (peripheral artery disease) ([place])   . Peripheral neuropathy   . Rectal bleeding    "for 7 years"  . Rectal bleeding   . Stroke [facility])    Past Surgical History:  Past Surgical History:  Procedure Laterality Date  . ABDOMINAL HYSTERECTOMY    . COLONOSCOPY     X 7  . COLONOSCOPY N/A 03/21/2017   Procedure: COLONOSCOPY;  Surgeon: Danie Binder, MD;  Location[facility];  Service: Endoscopy;  Laterality: N/A;  1215      Assessment & Plan Clinical Impression:   BeatriceWatkinsis a61 y.o.female with history of HTN, T2DM, diverticulosis, recent issues with depression and had stopped taking all medication except insulin for few weeks. History taken from chart review. She was admitted to [facility] on 06/15/17 with elevated BP, confusion and 2 week history of dysarthria. 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  MRI/MRA brain done revealing moderate size L-MCA infarct affecting posterior lateral frontal lobe and multiple missing L-MCA branch vessels and moderately advanced chronic SVD.  2D echo showed EF 60-65% with no wall abnormality and mildly calcified MV and AV. Carotid dopplers with < 50% ICA stenosis. Swallow evaluation done and patient placed on dysphagia 1, nectar liquids with water protocol.  She had acute changes in mental status with lethargy, minimal verbal output and CT head repeated 02/1 reviewed, showing without changes to left MCA infarct.  Negative for bleed but she developed fever 101.4 with and leucocytosis on early am of 2/2.  Blood cultures done for work up and she was started on Unasyn due to concerns of aspiration PNA. She has had increase in right facial weakness and RUE weakness and MBS repeated 02/4 due to functional decline with inability to elicit cough.    Patient with resultant dense right hemiparesis, expressive/receptive aphasia, dysphagia and  MD and Rehab CM reporting that patient much improved this am  and she was cleared medically to start CIR program  Patient currently requires total with basic self-care skills secondary to muscle weakness, decreased cardiorespiratoy endurance, impaired timing and sequencing, abnormal tone, motor apraxia and decreased coordination, decreased visual motor skills, decreased midline  orientation, decreased attention to right, right side neglect and ideational apraxia, decreased initiation, decreased attention, decreased awareness, decreased problem solving, decreased safety awareness, decreased memory and delayed processing and decreased sitting balance, decreased standing balance, decreased postural control, hemiplegia and decreased balance strategies.  Prior to hospitalization, patient could complete BADLs with independent .  Patient will benefit from skilled intervention to decrease level of assist with basic self-care skills and increase independence with basic self-care skills  prior to discharge home with care partner.  Anticipate patient will require 24 hour supervision and moderate physical assestance and follow up home health.  OT - End of Session Activity Tolerance: Tolerates 10 - 20 min activity with multiple rests Endurance Deficit: Yes OT Assessment Rehab Potential (ACUTE ONLY): Good OT Barriers to Discharge: Inaccessible home environment;Decreased caregiver support;Home environment access/layout;Weight OT Barriers to Discharge Comments: (4 stairs to enter house with no railing) OT Patient demonstrates impairments in the following area(s): Balance;Cognition;Edema;Endurance;Motor;Nutrition;Perception;Safety;Sensory;Vision OT Basic ADL's Functional Problem(s): Eating;Grooming;Bathing;Dressing;Toileting OT Transfers Functional Problem(s): Toilet;Tub/Shower OT Additional Impairment(s): Fuctional Use of Upper Extremity OT Plan OT Intensity: Minimum of 1-2 x/day, 45 to 90 minutes OT Frequency: 5 out of 7 days OT Duration/Estimated Length of Stay: 21-28 days OT Treatment/Interventions: Balance/vestibular training;Discharge planning;Functional electrical stimulation;Pain management;Self Care/advanced ADL retraining;UE/LE Coordination activities;Therapeutic Activities;Cognitive remediation/compensation;Disease mangement/prevention;Functional mobility  training;Patient/family education;Skin care/wound managment;Therapeutic Exercise;Visual/perceptual remediation/compensation;DME/adaptive equipment instruction;Neuromuscular re-education;Psychosocial support;Splinting/orthotics;UE/LE Strength taining/ROM;Wheelchair propulsion/positioning;Community reintegration OT Self Feeding Anticipated Outcome(s): min A OT Basic Self-Care Anticipated Outcome(s): MIN A UB dressing; MOD A LB dressing OT Toileting Anticipated Outcome(s): MOD A  OT Bathroom Transfers Anticipated Outcome(s): MOD A OT Recommendation Patient destination: Home Follow Up Recommendations: Home health OT Equipment Recommended: 3 in 1 bedside comode;Tub/shower bench   Skilled Therapeutic Intervention 1:1. Pt daughter in law present during 44 of session providing information on PLOF, hobbies, and interests. Pt eating with daughter feeding her upon entering room. Educated wife on SLP swallowing instructions, however did not check wife off until meeting with SLP. Pt unable to follow one step commands during functional BADL tasks requiring HOH A or multimodal cueing. Pt overall requires TOTAL A for bathing and dressing tasks at bed/EOB level d/t decreased initiation, R inattention, and perseveration on wiping mouth. Pt able to initiate washing face, however required HOH A to initiate/sequence bathing all other body parts. When bathing LLE pt requires HOH A for initiation, but able to continue to washing, however pt unable to persist washing RLE d/t neglect without HOH A. OT begins educating daughter in law about hemi dressing techniques and provides total A to don pants at bed level. Pt rolls R with HOH A to cross midline for rail and tactile cues to bend knee with MOD A. Pt rolls L with MAX A of 1 as daughter in law advances pants past hips. Pt supine<>sit EOB with MAX A and able to hold midline ~20 seconds before leaning R. Pt requires overall MAX increasing to total A for balance while  sitting EOB to don shirt with total A. Pt able to initiate threading LUE into shirt and assist OT thread shirt overhead. Pt becoming lethargic and repositioned back into supine. Pt asleep after repositioning with pillows to support RUE and left picture/diagrams for bed positioning at [facility] for family. Exited  session with pt sleeping in bed and exit alarm on.   OT Evaluation Precautions/Restrictions  Precautions Precautions: Fall Precaution Comments: truncal ataxia, gross coordination deficits Restrictions Other Position/Activity Restrictions: Positioning of right upper extremity in sight of patient and not pulling on it as it is flaccid.  General   Vital Signs Therapy Vitals Temp: 98.2 F (36.8 C) Temp Source: Oral Pulse Rate: 84 Resp: 18 BP: (!) 162/64 Patient Position (if appropriate): Lying Oxygen Therapy SpO2: 95 % O2 Device: Not Delivered Pain Pain Assessment Pain Assessment: No/denies pain Home Living/Prior Functioning Home Living Family/patient expects to be discharged to:: Private residence Living Arrangements: Children, Other relatives Available Help at Discharge: Family Type of Home: House Home Access: Stairs to enter Technical brewer of Steps: 4 Entrance Stairs-Rails: None Home Layout: Two level, Laundry or work area in basement, Able to live on main level with bedroom/bathroom Alternate Level Stairs-Number of Steps: 20 steps to second level; 12 to basement Bathroom Shower/Tub: Chiropodist: Standard  Lives With: Family, Son IADL History Current License: No Mode of Transportation: Family Occupation: Retired Leisure and Hobbies: listening Prior Function Level of Independence: Independent with basic ADLs  Able to Take Stairs?: Yes Comments: no AD for HH distances, SPC for short community distances ADL   Vision Baseline Vision/History: Wears glasses Wears Glasses: Reading only Vision Assessment?: Vision impaired- to be further  tested in functional context Additional Comments: unable to assess formally d/t decreased attention/communication deficits Perception  Perception: Impaired(R inattention/neglect) Praxis Praxis: Impaired Praxis-Other Comments: difficulty with intitation/ideation of grooming task Cognition Overall Cognitive Status: Impaired/Different from baseline Arousal/Alertness: Lethargic(initially awake and alert, however with increased activity pt becoming lethargic) Orientation Level: Nonverbal/unable to assess Attention: Sustained Sustained Attention: Impaired Sustained Attention Impairment: Verbal basic;Functional basic Problem Solving: Impaired Problem Solving Impairment: Functional basic Behaviors: Perseveration Safety/Judgment: Impaired Comments: right inattention Sensation Sensation Light Touch: (unable to assess 2/2 communication/R neglect) Proprioception: Impaired by gross assessment Coordination Gross Motor Movements are Fluid and Coordinated: No Fine Motor Movements are Fluid and Coordinated: No Motor  Motor Motor: Hemiplegia(dense hemi in RUE) Mobility  Bed Mobility Bed Mobility: Rolling Right;Rolling Left Rolling Right: 3: Mod assist Rolling Left: 1: +1 Total assist Transfers Transfers: Not assessed(safety concerns d/t lethargy)  Trunk/Postural Assessment  Cervical Assessment Cervical Assessment: Exceptions to WFL(L gaze preference) Thoracic Assessment Thoracic Assessment: Exceptions to WFL(rounded shoulders) Lumbar Assessment Lumbar Assessment: Exceptions to WFL(posterior pelvic tilt) Postural Control Postural Control: Deficits on evaluation(delayed/absent: pt able to reach to L when leaning R to grab bed rail remain upright )  Balance Static Sitting Balance Static Sitting - Level of Assistance: 2: Max assist Static Sitting - Comment/# of Minutes: Pt able to initailly maintain midline ~20 seconds before leaning R requiring MAX A  to maintain midline; at end of sitting  EOB, pt pushing with LUE  Dynamic Sitting Balance Dynamic Sitting - Level of Assistance: 1: +1 Total assist Reach (Patient is able to reach ___ inches to right, left, forward, back): dressing Extremity/Trunk Assessment RUE Assessment RUE Assessment: Exceptions to WFL(dense hemi; no activation noted; full PROM) LUE Assessment LUE Assessment: Within Functional Limits   See Function Navigator for Current Functional Status.   Refer to Care Plan for Long Term Goals  Recommendations for other services: None    Discharge Criteria: Patient will be discharged from OT if patient refuses treatment 3 consecutive times without medical reason, if treatment goals not met, if there is a change in medical status, if patient makes no progress towards  goals or if patient is discharged from hospital.  The above assessment, treatment plan, treatment alternatives and goals were discussed and mutually agreed upon: by family  Tonny Branch 06/21/2017, 3:06 PM

## 2017-06-21 NOTE — Evaluation (Signed)
Speech Language Pathology Assessment and Plan  Patient Details  Name: Sandra [name] MRN: [id number] Date of Birth: Sep 19, 1940  SLP Diagnosis: Aphasia;Dysphagia;Cognitive Impairments  Rehab Potential: Good ELOS: 21-28 days    Today's Date: 06/21/2017 SLP Individual Time: 0805-0900 SLP Individual Time Calculation (min): 55 min   Problem List:  Patient Active Problem List   Diagnosis Date Noted  . Acute ischemic right MCA stroke ([place]) 06/20/2017  . Acute ischemic stroke ([place])   . Diabetes mellitus type 2 in obese ([place])   . Benign essential HTN   . Noncompliance   . FUO (fever of unknown origin)   . Leukocytosis   . Dysphagia, post-stroke   . TIA (transient ischemic attack) 06/15/2017  . Hyperlipidemia associated with type 2 diabetes mellitus ([place]) 06/14/2017  . Type 2 diabetes mellitus with neurological complications ([place]) 38/18/2993  . Stroke ([place]) 03/31/2017  . History of adenomatous polyp of colon 02/11/2017  . Dysphagia 11/19/2016  . Essential hypertension 08/12/2016  . Hemorrhoids 06/16/2016  . Rectal bleeding 06/16/2016  . Constipation 06/16/2016   Past Medical History:  Past Medical History:  Diagnosis Date  . Colon polyp   . Constipation   . Diabetes mellitus without complication ([place])   . GI bleed   . Hemorrhoids   . High cholesterol   . Hypertension   . PAD (peripheral artery disease) ([place])   . Peripheral neuropathy   . Rectal bleeding    "for 7 years"  . Rectal bleeding   . Stroke [facility])    Past Surgical History:  Past Surgical History:  Procedure Laterality Date  . ABDOMINAL HYSTERECTOMY    . COLONOSCOPY     X 7  . COLONOSCOPY N/A 03/21/2017   Procedure: COLONOSCOPY;  Surgeon: Danie Binder, MD;  Location: [facility];  Service: Endoscopy;  Laterality: N/A;  1215     Assessment / Plan / Recommendation Clinical Impression BeatriceWatkinsis a21 y.o.female with history of HTN, T2DM, diverticulosis, recent issues with depression and had  stopped taking all medication except insulin for few weeks. History taken from chart review. She was admitted to [facility] on 06/15/17 with elevated BP, confusion and 2 week history of dysarthria. CTA head done revealing acute to subacute left frontoparietal MCA terrgiveitory infarct with left M2 occlusion and advanced intracranial atherosclerosis with scattered severe stenosis in anterior and posterior circulation.  MRI/MRA brain done revealing moderate size L-MCA infarct affecting posterior lateral frontal lobe and multiple missing L-MCA branch vessels and moderately advanced chronic SVD.  Swallow evaluation done and patient placed on dysphagia 1, nectar liquids with water protocol.  She had acute changes in mental status with lethargy, minimal verbal output and CT head repeated 02/1 reviewed, showing without changes to left MCA infarct.  Negative for bleed but she developed fever 101.4 with and leucocytosis on early am of 2/2.  Blood cultures done for work up and she was started on Unasyn due to concerns of aspiration PNA. She has had increase in right facial weakness and RUE weakness and MBS repeated 02/4 due to functional decline with inability to elicit cough.  Patient with resultant dense right hemiparesis, expressive/receptive aphasia, dysphagia and  MD and Rehab CM reporting that patient much improved this am and she was cleared medically to start CIR program.  SLP evaluation was completed on 06/21/2017 with the following results:   Pt presents with significant right sided oral motor weakness impacting containment and transit of boluses which results in anterior labial loss of liquids>solids.  As a  result, pt has prolonged and inefficient oral phase; however, pharyngeal phase appears strong once initiated and pt had no coughing with purees or nectar thick liquids.  For now, recommend that pt remain on currently prescribed diet of dys 1, nectar thick liquids with full staff supervision for use of swallowing  precautions.  Pt has significant cognitive-linguistic deficits s/p L MCA CVA.  She has severe global aphasia nad needs max to total assist to answer simple yes/no questions or follow 1 step commands.  She appears to have some extent of motor planning involvement as well including poor task initiation and sequencing with noted perseveration during simple self feeding tasks.  Pt was nonverbal at the time of today's evaluation despite max to total cues for verbalization during simple, social exchanges or automatic sequences.  Cognition was not formally assessed due to language impairments; however, pt presents with impaired sustained attention to tasks and right inattention with left gaze preference.   Given the abovementioned deficits, pt would benefit from intensive skilled ST while inpatient in order to maximize functional independence and reduce burden of care prior to discharge.  Anticipate that pt will need 24/7 supervision at discharge in addition to [place] follow up at next level of care.       Skilled Therapeutic Interventions          Cognitive-linguistic and bedside swallow evaluation completed with results and recommendations reviewed with patient      SLP Assessment  Patient will need skilled Speech Lanaguage Pathology Services during CIR admission    Recommendations  SLP Diet Recommendations: Dysphagia 1 (Puree);Nectar Liquid Administration via: Cup Medication Administration: Whole meds with puree Supervision: Full supervision/cueing for compensatory strategies Compensations: Minimize environmental distractions;Slow rate;Small sips/bites;Lingual sweep for clearance of pocketing Postural Changes and/or Swallow Maneuvers: Out of bed for meals;Seated upright 90 degrees;Upright 30-60 min after meal Oral Care Recommendations: Oral care BID Patient destination: Home Follow up Recommendations: Home Health SLP;Outpatient SLP;Skilled Nursing facility;24 hour supervision/assistance Equipment  Recommended: To be determined    SLP Frequency 3 to 5 out of 7 days   SLP Duration  SLP Intensity  SLP Treatment/Interventions 21-28 days  Minumum of 1-2 x/day, 30 to 90 minutes  Cognitive remediation/compensation;Cueing hierarchy;Dysphagia/aspiration precaution training;Functional tasks;Environmental controls;Internal/external aids;Multimodal communication approach;Patient/family education;Speech/Language facilitation    Pain Pain Assessment Pain Assessment: No/denies pain  Prior Functioning Cognitive/Linguistic Baseline: Within functional limits Type of Home: House  Lives With: Family;Son Available Help at Discharge: Family  Function:  Eating Eating   Modified Consistency Diet: Yes Eating Assist Level: Help with picking up utensils;Help managing cup/glass;Supervision or verbal cues;Set up assist for   Eating Set Up Assist For: Opening containers       Cognition Comprehension Comprehension assist level: Understands basic 25 - 49% of the time/ requires cueing 50 - 75% of the time  Expression   Expression assist level: Expresses basis less than 25% of the time/requires cueing >75% of the time.  Social Interaction Social Interaction assist level: Interacts appropriately 25 - 49% of time - Needs frequent redirection.  Problem Solving Problem solving assist level: Solves basic 25 - 49% of the time - needs direction more than half the time to initiate, plan or complete simple activities  Memory Memory assist level: Recognizes or recalls less than 25% of the time/requires cueing greater than 75% of the time   Short Term Goals: Week 1: SLP Short Term Goal 1 (Week 1): Pt will answer yes/no immediate, personally relevant questions in 50% of opportunities with max  assist multimodal cues.   SLP Short Term Goal 2 (Week 1): Pt will follow 1 step commands for 50% accuracy during basic, familiar tasks with max assist multimodal cues.   SLP Short Term Goal 3 (Week 1): Pt will verbalize  at the vocalic syllable level in 25% of opportunities with max assist multimodal cues.   SLP Short Term Goal 4 (Week 1): Pt will sustain her attention to basic, familiar tasks for 1 minute intervals with max verbal cues for redirection.   SLP Short Term Goal 5 (Week 1): Pt will consume dys 1 textures and nectar thick liquids with mod verbal cues for use of swallowing precautions and minimal overt s/s of aspiration.   SLP Short Term Goal 6 (Week 1): Pt will consume therapeutic trials of thin liquids with mod verbal cues for use of swallowing precautions and minimal overt s/s of aspiration over 3 targeted sessions prior to repeat objective assessment.   Refer to Care Plan for Long Term Goals  Recommendations for other services: None   Discharge Criteria: Patient will be discharged from SLP if patient refuses treatment 3 consecutive times without medical reason, if treatment goals not met, if there is a change in medical status, if patient makes no progress towards goals or if patient is discharged from hospital.  The above assessment, treatment plan, treatment alternatives and goals were discussed and mutually agreed upon: No family available/patient unable  Emilio Math 06/21/2017, 12:22 PM

## 2017-06-21 NOTE — Discharge Instructions (Signed)
Inpatient Rehab Discharge Instructions  Sandra [name] Discharge date and time:    Activities/Precautions/ Functional Status: Activity: no lifting, driving, or strenuous exercise  till cleared by MD Diet:  Wound Care:    Functional status:  ___ No restrictions     ___ Walk up steps independently _X__ 24/7 supervision/assistance   ___ Walk up steps with assistance ___ Intermittent supervision/assistance  ___ Bathe/dress independently ___ Walk with walker     _X__ Bathe/dress with assistance ___ Walk Independently    ___ Shower independently ___ Walk with assistance    ___ Shower with assistance _X__ No alcohol     ___ Return to work/school ________   Special Instructions:   STROKE/TIA DISCHARGE INSTRUCTIONS SMOKING Cigarette smoking nearly doubles your risk of having a stroke & is the single most alterable risk factor  If you smoke or have smoked in the last 12 months, you are advised to quit smoking for your health.  Most of the excess cardiovascular risk related to smoking disappears within a year of stopping.  Ask you doctor about anti-smoking medications  [place] Quit Line: 1-800-QUIT NOW  Free Smoking Cessation Classes [phone]  CHOLESTEROL Know your levels; limit fat & cholesterol in your diet  Lipid Panel     Component Value Date/Time   CHOL 204 (H) 06/16/2017 0632   TRIG 93 06/16/2017 0632   HDL 35 (L) 06/16/2017 0632   CHOLHDL 5.8 06/16/2017 0632   VLDL 19 06/16/2017 0632   LDLCALC 150 (H) 06/16/2017 2426      Many patients benefit from treatment even if their cholesterol is at goal.  Goal: Total Cholesterol (CHOL) less than 160  Goal:  Triglycerides (TRIG) less than 150  Goal:  HDL greater than 40  Goal:  LDL (LDLCALC) less than 100   BLOOD PRESSURE American Stroke Association blood pressure target is less that 120/80 mm/Hg  Your discharge blood pressure is:  BP: (!) 198/69  Monitor your blood pressure  Limit your salt and alcohol intake  Many  individuals will require more than one medication for high blood pressure  DIABETES (A1c is a blood sugar average for last 3 months) Goal HGBA1c is under 7% (HBGA1c is blood sugar average for last 3 months)  Diabetes:     Lab Results  Component Value Date   HGBA1C 8.2 (H) 06/15/2017     Your HGBA1c can be lowered with medications, healthy diet, and exercise.  Check your blood sugar as directed by your physician  Call your physician if you experience unexplained or low blood sugars.  PHYSICAL ACTIVITY/REHABILITATION Goal is 30 minutes at least 4 days per week  Activity: No driving, Therapies: see above Return to work: N/A  Activity decreases your risk of heart attack and stroke and makes your heart stronger.  It helps control your weight and blood pressure; helps you relax and can improve your mood.  Participate in a regular exercise program.  Talk with your doctor about the best form of exercise for you (dancing, walking, swimming, cycling).  DIET/WEIGHT Goal is to maintain a healthy weight  Your discharge diet is: DIET - DYS 1 Room service appropriate? Yes; Fluid consistency: Nectar Thick liquids Your height is:  Height: 5\' 5"  (165.1 cm) Your current weight is: Weight: 88 kg (193 lb 14.4 oz) Your Body Mass Index (BMI) is:  BMI (Calculated): 32.27  Following the type of diet specifically designed for you will help prevent another stroke.  Your goal weight is: 150 lbs  Your  goal Body Mass Index (BMI) is 19-24.  Healthy food habits can help reduce 3 risk factors for stroke:  High cholesterol, hypertension, and excess weight.  RESOURCES Stroke/Support Group:  Call [phone]   STROKE EDUCATION PROVIDED/REVIEWED AND GIVEN TO PATIENT Stroke warning signs and symptoms How to activate emergency medical system (call 911). Medications prescribed at discharge. Need for follow-up after discharge. Personal risk factors for stroke. Pneumonia vaccine given:  Flu vaccine given:  My  questions have been answered, the writing is legible, and I understand these instructions.  I will adhere to these goals & educational materials that have been provided to me after my discharge from the hospital.     My questions have been answered and I understand these instructions. I will adhere to these goals and the provided educational materials after my discharge from the hospital.  Patient/Caregiver Signature _______________________________ Date __________  Clinician Signature _______________________________________ Date __________  Please bring this form and your medication list with you to all your follow-up doctor's appointments.

## 2017-06-21 NOTE — Progress Notes (Signed)
*  PRELIMINARY RESULTS* Vascular Ultrasound Bilateral lower extremity venous duplex has been completed.  Preliminary findings: No evidence of deep vein thrombosis or baker's cysts bilaterally.   Myrtie Cruise Shella Lahman 06/21/2017, 11:14 AM

## 2017-06-21 NOTE — Care Management Note (Signed)
[facility] Individual Statement of Services  Patient Name:  Sandra [name]  Date:  06/21/2017  Welcome to the [facility].  Our goal is to provide you with an individualized program based on your diagnosis and situation, designed to meet your specific needs.  With this comprehensive rehabilitation program, you will be expected to participate in at least 3 hours of rehabilitation therapies Monday-Friday, with modified therapy programming on the weekends.  Your rehabilitation program will include the following services:  Physical Therapy (PT), Occupational Therapy (OT), Speech Therapy (ST), 24 hour per day rehabilitation nursing, Therapeutic Recreaction (TR), Neuropsychology, Case Management (Social Worker), Rehabilitation Medicine, Nutrition Services and Pharmacy Services  Weekly team conferences will be held on Wednesday to discuss your progress.  Your Social Worker will talk with you frequently to get your input and to update you on team discussions.  Team conferences with you and your family in attendance may also be held.  Expected length of stay: 21-28 days  Overall anticipated outcome: min/mod level of assist  Depending on your progress and recovery, your program may change. Your Social Worker will coordinate services and will keep you informed of any changes. Your Social Worker's name and contact numbers are listed  below.  The following services may also be recommended but are not provided by the [facility]:    [place] will be made to provide these services after discharge if needed.  Arrangements include referral to agencies that provide these services.  Your insurance has been verified to be:  Medicare & Tricare Your primary doctor is:  Caren Macadam  Pertinent information will be shared with your doctor and your insurance  company.  Social Worker:  Ovidio Kin, [place] or [phone]  Information discussed with and copy given to patient by: Elease Hashimoto, 06/21/2017, 9:54 AM

## 2017-06-22 ENCOUNTER — Inpatient Hospital Stay (HOSPITAL_COMMUNITY): Payer: Medicare Other | Admitting: Occupational Therapy

## 2017-06-22 ENCOUNTER — Inpatient Hospital Stay (HOSPITAL_COMMUNITY): Payer: Medicare Other

## 2017-06-22 ENCOUNTER — Inpatient Hospital Stay (HOSPITAL_COMMUNITY): Payer: Medicare Other | Admitting: Physical Therapy

## 2017-06-22 ENCOUNTER — Inpatient Hospital Stay (HOSPITAL_COMMUNITY): Payer: Medicare Other | Admitting: Speech Pathology

## 2017-06-22 DIAGNOSIS — G8191 Hemiplegia, unspecified affecting right dominant side: Secondary | ICD-10-CM

## 2017-06-22 DIAGNOSIS — R4701 Aphasia: Secondary | ICD-10-CM | POA: Insufficient documentation

## 2017-06-22 DIAGNOSIS — I161 Hypertensive emergency: Secondary | ICD-10-CM

## 2017-06-22 DIAGNOSIS — Z09 Encounter for follow-up examination after completed treatment for conditions other than malignant neoplasm: Secondary | ICD-10-CM | POA: Insufficient documentation

## 2017-06-22 DIAGNOSIS — E7849 Other hyperlipidemia: Secondary | ICD-10-CM

## 2017-06-22 DIAGNOSIS — R1310 Dysphagia, unspecified: Secondary | ICD-10-CM

## 2017-06-22 DIAGNOSIS — E119 Type 2 diabetes mellitus without complications: Secondary | ICD-10-CM

## 2017-06-22 DIAGNOSIS — I69353 Hemiplegia and hemiparesis following cerebral infarction affecting right non-dominant side: Secondary | ICD-10-CM

## 2017-06-22 DIAGNOSIS — R202 Paresthesia of skin: Secondary | ICD-10-CM

## 2017-06-22 DIAGNOSIS — Z888 Allergy status to other drugs, medicaments and biological substances status: Secondary | ICD-10-CM

## 2017-06-22 DIAGNOSIS — Z885 Allergy status to narcotic agent status: Secondary | ICD-10-CM

## 2017-06-22 DIAGNOSIS — I639 Cerebral infarction, unspecified: Secondary | ICD-10-CM

## 2017-06-22 DIAGNOSIS — I10 Essential (primary) hypertension: Secondary | ICD-10-CM

## 2017-06-22 DIAGNOSIS — Z833 Family history of diabetes mellitus: Secondary | ICD-10-CM

## 2017-06-22 LAB — CBC WITH DIFFERENTIAL/PLATELET
BASOS ABS: 0 10*3/uL (ref 0.0–0.1)
BASOS PCT: 0 %
Eosinophils Absolute: 0 10*3/uL (ref 0.0–0.7)
Eosinophils Relative: 0 %
HCT: 41.2 % (ref 36.0–46.0)
Hemoglobin: 13.5 g/dL (ref 12.0–15.0)
LYMPHS PCT: 20 %
Lymphs Abs: 2.2 10*3/uL (ref 0.7–4.0)
MCH: 29.1 pg (ref 26.0–34.0)
MCHC: 32.8 g/dL (ref 30.0–36.0)
MCV: 88.8 fL (ref 78.0–100.0)
MONO ABS: 1.1 10*3/uL — AB (ref 0.1–1.0)
Monocytes Relative: 10 %
Neutro Abs: 7.5 10*3/uL (ref 1.7–7.7)
Neutrophils Relative %: 70 %
PLATELETS: 287 10*3/uL (ref 150–400)
RBC: 4.64 MIL/uL (ref 3.87–5.11)
RDW: 15.2 % (ref 11.5–15.5)
WBC: 10.8 10*3/uL — ABNORMAL HIGH (ref 4.0–10.5)

## 2017-06-22 LAB — URINALYSIS, ROUTINE W REFLEX MICROSCOPIC
Bilirubin Urine: NEGATIVE
HGB URINE DIPSTICK: NEGATIVE
KETONES UR: NEGATIVE mg/dL
Leukocytes, UA: NEGATIVE
NITRITE: NEGATIVE
PROTEIN: 100 mg/dL — AB
Specific Gravity, Urine: 1.02 (ref 1.005–1.030)
pH: 6 (ref 5.0–8.0)

## 2017-06-22 LAB — GLUCOSE, CAPILLARY
GLUCOSE-CAPILLARY: 273 mg/dL — AB (ref 65–99)
Glucose-Capillary: 199 mg/dL — ABNORMAL HIGH (ref 65–99)
Glucose-Capillary: 219 mg/dL — ABNORMAL HIGH (ref 65–99)
Glucose-Capillary: 285 mg/dL — ABNORMAL HIGH (ref 65–99)

## 2017-06-22 MED ORDER — CLONIDINE HCL 0.1 MG PO TABS: 0.1000 mg | ORAL_TABLET | Freq: Four times a day (QID) | ORAL | Status: DC | PRN

## 2017-06-22 MED ORDER — INSULIN GLARGINE 100 UNIT/ML ~~LOC~~ SOLN
27.0000 [IU] | Freq: Every day | SUBCUTANEOUS | Status: DC
Start: 2017-06-23 — End: 2017-06-23
  Filled 2017-06-22: qty 0.27

## 2017-06-22 MED ORDER — AMLODIPINE BESYLATE 5 MG PO TABS: 5.0000 mg | ORAL_TABLET | Freq: Every day | ORAL | Status: DC

## 2017-06-22 MED ORDER — AMLODIPINE BESYLATE 5 MG PO TABS
5.0000 mg | ORAL_TABLET | Freq: Every day | ORAL | Status: DC
  Administered 2017-06-22 – 2017-06-23 (×2): 5 mg via ORAL
  Filled 2017-06-22 (×3): qty 1

## 2017-06-22 NOTE — Patient Care Conference (Signed)
Inpatient RehabilitationTeam Conference and Plan of Care Update Date: 06/22/2017   Time: 11:00 Am    Patient Name: Sandra [name]      Medical Record Number: [id number]  Date of Birth: [birth year] Sex: Female         Room/Bed: 4W19C/4W19C-01 Payor Info: Payor: MEDICARE / Plan: MEDICARE PART A AND B / Product Type: *No Product type* /    Admitting Diagnosis: L CVA  Admit Date/Time:  06/20/2017  2:42 PM Admission Comments: No comment available   Primary Diagnosis:  <principal problem not specified> Principal Problem: <principal problem not specified>  Patient Active Problem List   Diagnosis Date Noted  . 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 Acute ischemic stroke ([place])   . Diabetes mellitus type 2 in obese ([place])   . Benign essential HTN   . Noncompliance   . FUO (fever of unknown origin)   . Leukocytosis   . Dysphagia, post-stroke   . TIA (transient ischemic attack) 06/15/2017  . Hyperlipidemia associated with type 2 diabetes mellitus ([place]) 06/14/2017  . Type 2 diabetes mellitus with neurological complications ([place]) 14/97/0263  . Stroke ([place]) 03/31/2017  . History of adenomatous polyp of colon 02/11/2017  . Dysphagia 11/19/2016  . Essential hypertension 08/12/2016  . Hemorrhoids 06/16/2016  . Rectal bleeding 06/16/2016  . Constipation 06/16/2016    Expected Discharge Date:    Team Members Present: Physician leading conference: Dr. Alysia Penna Social Worker Present: Ovidio Kin, LCSW Nurse Present: Brita Romp, RN PT Present: Dwyane Dee, PT OT Present: Cherylynn Ridges, OT SLP Present: Weston Anna, SLP PPS Coordinator present : Daiva Nakayama, RN, CRRN     Current Status/Progress Goal Weekly Team Focus  Medical   Fever of unknown origin, global aphasic, D1 nectar with poor appetite  reduce aspiration risk, ensure adequate nutrition  work up of FUO   Bowel/Bladder   Incontinent of B&B.  Keep clean and dry.  Assess toileting needs q shift  and PRN.   Swallow/Nutrition/ Hydration   dys 1, nectar thick liquids; full staff supervision   min assist   trials of thin liquids, strategies to contain boluses and clear oral residue    ADL's   Total A +2   min/Mod A overall  R NMR, sitting balance/tolerance, transfer training, attention, initiation    Mobility   +2 total for all mobility, mechanical lifts for transfers  lofty mod/max assist  arousal, attention, visual scanning, functional mobility as able   Communication   nonverbal, max to total assist to follow commands, answer yes/no questions  mod assist   auditory comprehension (ie yes/no questions, following commands), vocalization   Safety/Cognition/ Behavioral Observations  max to total assist   min assist   scanning to the right of midline, sustained attention to basic, familiar tasks    Pain   No s/s of pain.  <2.  Assess q shift and PRN for pain.   Skin   No complications.  Maintain skin integrity.  Assess skin q shift and PRN.      *See Care Plan and progress notes for long and short-term goals.     Barriers to Discharge  Current Status/Progress Possible Resolutions Date Resolved   Physician    Inaccessible home environment;Medical stability;Incontinence;Wound Care  +2 total A with severe global aphasia  initiating rehab program  ID consult for FUO      Nursing                  PT  Inaccessible home environment;Decreased caregiver support;Incontinence;Insurance for SNF coverage                 OT Inaccessible home environment;Decreased caregiver support;Home environment access/layout;Weight  (4 stairs to enter house with no railing)             SLP                SW Decreased caregiver support [name] is of small stature and pt is a large woman            Discharge Planning/Teaching Needs:  Family wants to take home but daughter in-law is small and can not provide much physical assist. Son is a PT at [employer]. Await progress here.      Team  Discussion:  Goals set for min/mod level of assist. Currently plus 2 total assist. Consulted ID to evaluate regarding higher white count. Checking UA again. Poor iniatiation. Incontinent B & B. Dye 1 nectar trials of thin. Poor po intake. Maybe set discharge target date next week. Will be much care for family.  Revisions to Treatment Plan:  New eval set target discharge date next week    Continued Need for Acute Rehabilitation Level of Care: The patient requires daily medical management by a physician with specialized training in physical medicine and rehabilitation for the following conditions: Daily direction of a multidisciplinary physical rehabilitation program to ensure safe treatment while eliciting the highest outcome that is of practical value to the patient.: Yes Daily medical management of patient stability for increased activity during participation in an intensive rehabilitation regime.: Yes Daily analysis of laboratory values and/or radiology reports with any subsequent need for medication adjustment of medical intervention for : Other;Neurological problems  Jiles Goya, Gardiner Rhyme 06/22/2017, 1:06 PM

## 2017-06-22 NOTE — Progress Notes (Signed)
Occupational Therapy Session Note  Patient Details  Name: Sandra [name] MRN: [id number] Date of Birth: [birth year]  Today's Date: 06/22/2017 OT Individual Time: 0900-1000 OT Individual Time Calculation (min): 60 min   Short Term Goals: Week 1:  OT Short Term Goal 1 (Week 1): Pt will maintain static sitting balance for  5 min EOM/EOB with min A in prep for toileting OT Short Term Goal 2 (Week 1): Pt will locate 2/2 grooming items on R side of sink with MOD VC OT Short Term Goal 3 (Week 1): Pt will manage RUE with LUE prior to transfer with MOD VC to demonstrate R body attention OT Short Term Goal 4 (Week 1): Pt will transfer to w/c with MAX A of 1 in prep for [facility] transfer  Skilled Therapeutic Interventions/Progress Updates:    OT treatment session focused on attention, initiation, bed mobility, and bathing/dressing. Bed level LB bathing/dressing completed with total A +2. Worked on attention and initiation with BADL tasks. Purwhick removed with nurse consent. Total A for peri-care and brief change.  Pt unable to follow one-step commands. Hand over hand A to initiate any bathing tasks, then pt perseverated on washing same body part and needed HOH A to move to next body part. Rolling progressed from total A x2 to Max A x2 with practice. Maximove from bed> TIS wc. Adjusted wc for improved head/neck support and pt left tilted in TIS wc with safety belt on and call bell in hand.   Therapy Documentation Precautions:  Precautions Precautions: Fall Precaution Comments: dense R hemiparesis, R inattention Restrictions Weight Bearing Restrictions: No Other Position/Activity Restrictions: Positioning of right upper extremity in sight of patient and not pulling on it as it is flaccid.   See Function Navigator for Current Functional Status.   Therapy/Group: Individual Therapy  Valma Cava 06/22/2017, 10:02 AM

## 2017-06-22 NOTE — Progress Notes (Signed)
Bilateral upper extremity venous duplex has been completed. Negative for DVT.  06/22/17 4:13 PM Carlos Levering RVT

## 2017-06-22 NOTE — Progress Notes (Signed)
Speech Language Pathology Daily Session Note  Patient Details  Name: Sandra [name] MRN: [id number] Date of Birth: March 28, 1941  Today's Date: 06/22/2017 SLP Individual Time: 1000-1030 SLP Individual Time Calculation (min): 30 min  Short Term Goals: Week 1: SLP Short Term Goal 1 (Week 1): Pt will answer yes/no immediate, personally relevant questions in 50% of opportunities with max assist multimodal cues.   SLP Short Term Goal 2 (Week 1): Pt will follow 1 step commands for 50% accuracy during basic, familiar tasks with max assist multimodal cues.   SLP Short Term Goal 3 (Week 1): Pt will verbalize at the vocalic syllable level in 25% of opportunities with max assist multimodal cues.   SLP Short Term Goal 4 (Week 1): Pt will sustain her attention to basic, familiar tasks for 1 minute intervals with max verbal cues for redirection.   SLP Short Term Goal 5 (Week 1): Pt will consume dys 1 textures and nectar thick liquids with mod verbal cues for use of swallowing precautions and minimal overt s/s of aspiration.   SLP Short Term Goal 6 (Week 1): Pt will consume therapeutic trials of thin liquids with mod verbal cues for use of swallowing precautions and minimal overt s/s of aspiration over 3 targeted sessions prior to repeat objective assessment.   Skilled Therapeutic Interventions: Skilled treatment session focused on communication and dysphagia goals. Pt with fever of unknown origin over last 24 hours. SLP facilitated session by providing skilled observation of pt consuming nectar thick liquids to ensure safest diet at bedside. Pt with no overt s/s of aspiration. Pt smiled and knoded head inconsistently to SLP interaction. Given Max A cues, pt able to look at basic item (spoon, fork etc from breakfast tray) for brief periods of time. Pt with some groping as she attempted to name objects but no approximations were verbalized. Pt left upright in wheelchair with all needs within reach. Continue per  current plan of care.      Function:  Eating Eating   Modified Consistency Diet: Yes Eating Assist Level: Help with picking up utensils;Help managing cup/glass;Supervision or verbal cues;Set up assist for   Eating Set Up Assist For: Opening containers       Cognition Comprehension Comprehension assist level: Understands basic 25 - 49% of the time/ requires cueing 50 - 75% of the time;Understands basic less than 25% of the time/ requires cueing >75% of the time  Expression   Expression assist level: Expresses basis less than 25% of the time/requires cueing >75% of the time.  Social Interaction Social Interaction assist level: Interacts appropriately 25 - 49% of time - Needs frequent redirection.  Problem Solving Problem solving assist level: Solves basic less than 25% of the time - needs direction nearly all the time or does not effectively solve problems and may need a restraint for safety  Memory Memory assist level: Recognizes or recalls less than 25% of the time/requires cueing greater than 75% of the time    Pain    Therapy/Group: Individual Therapy  Shloimy Michalski 06/22/2017, 3:16 PM

## 2017-06-22 NOTE — Consult Note (Signed)
Date of Admission:  06/20/2017          Reason for Consult: fevers    Referring Provider: Dr. Letta Pate - CIR  Assessment:  Sandra [name] is a 77 y.o. female with h/o HTN, HLD, T2DM admitted 1/30 with hypertensive emergency found to have mod left MCA stroke with deficits of aphasia, dysphagia, and right sided hemiparesis. She has since been admitted to CIR for rehab. Patient has started having fevers 2/2; workup has included a CXR which was not indicative of pneumonia however poor study, LE dopplers which were negative for DVTs, BCx 2/3 NGTD x3 days, and repeat CT head as patient also had some change in functional status which showed edema w/o shift but no hemorrhagic conversion. Patient was started on unasyn 2/3 for presumed aspiration pneumonia with no further fevers until last night to 101.43F.  Plan: 1. Obtain UE dopplers to eval for DVT 2. F/u HIV screen, acute hep panel 3. F/u UA 4. Discussed with nursing; they will perform full skin check this afternoon 5. F/u BCx from 2/3 6. If all negative, could be 2/2 atelectasis - would continue incentive spirometry q1hr as able.  Active Problems:   Stroke [facility])   Acute ischemic right MCA stroke (HCC)  Scheduled Meds: . aspirin EC  325 mg Oral Daily  . atorvastatin  40 mg Oral q1800  . citalopram  10 mg Oral Daily  . clopidogrel  75 mg Oral Q breakfast  . enoxaparin (LOVENOX) injection  40 mg Subcutaneous Q24H  . insulin aspart  0-15 Units Subcutaneous TID WC  . insulin aspart  0-5 Units Subcutaneous QHS  . insulin aspart  8 Units Subcutaneous TID WC  . [START ON 06/23/2017] insulin glargine  27 Units Subcutaneous Daily  . mouth rinse  15 mL Mouth Rinse BID  . potassium chloride  20 mEq Oral Q12H  . senna-docusate  2 tablet Oral QHS  . vitamin B-12  1,000 mcg Oral Daily   Continuous Infusions: . ampicillin-sulbactam (UNASYN) IV Stopped (06/22/17 0455)   PRN Meds:.acetaminophen, alum & mag hydroxide-simeth, bisacodyl,  diphenhydrAMINE, guaiFENesin-dextromethorphan, prochlorperazine **OR** prochlorperazine **OR** prochlorperazine, RESOURCE THICKENUP CLEAR, sodium phosphate, traZODone  HPI: Sandra [name] is a 77 y.o. female with h/o HTN, HLD, T2DM admitted 1/30 with hypertensive emergency found to have mod left MCA stroke with deficits of aphasia, dysphagia, and right sided hemiparesis. She has since been admitted to CIR for rehab. Patient has started having fevers 2/2; workup has included a CXR which was not indicative of pneumonia however poor study, LE dopplers which were negative for DVTs, BCx 2/3 NGTD x3 days, and repeat CT head as patient also had some change in functional status which showed edema w/o shift but no hemorrhagic conversion. Patient was started on unasyn for presumed aspiration pneumonia with no further fevers until last night to 101.43F. She does have mild leukocytosis; UA has been ordered but not collected yet.  Review of Systems: ROS  Unable to obtain due to global aphasia  Past Medical History:  Diagnosis Date  . Colon polyp   . Constipation   . Diabetes mellitus without complication ([place])   . GI bleed   . Hemorrhoids   . High cholesterol   . Hypertension   . PAD (peripheral artery disease) ([place])   . Peripheral neuropathy   . Rectal bleeding    "for 7 years"  . Rectal bleeding   . Stroke [facility])     Social History   Tobacco Use  .  Smoking status: Never Smoker  . Smokeless tobacco: Never Used  Substance Use Topics  . Alcohol use: No  . Drug use: No    Family History  Problem Relation Age of Onset  . Diabetes Mother   . Cancer Father   . Diabetes Sister   . Diabetes Brother   . Diabetes Daughter   . Diabetes Maternal Grandmother   . Diabetes Daughter   . Colon cancer Neg Hx    Allergies  Allergen Reactions  . Hctz [Hydrochlorothiazide] Other (See Comments)    Caused pancreatitis flare up  . Morphine And Related Other (See Comments)    dizziness  . Valium  [Diazepam] Other (See Comments)    Confusion, blurred vision    OBJECTIVE: Blood pressure (!) 170/80, pulse 91, temperature 100 F (37.8 C), temperature source Axillary, resp. rate 20, height 5\' 5"  (1.651 m), weight 193 lb 14.4 oz (88 kg), SpO2 95 %.  Physical Exam  Constitutional: NAD, sitting in chair HEENT: anicteric sclera; unable to eval oral mucosa due to patient not being able to follow this command CV: RRR, no M/R/G, radial and DP pulses intact, no LE edema Resp: no increased work of breathing; no rales/wheezes/rhonchi appreciated anteriorly and laterally  Abd: Soft, NDNT Ext: hemiparesis on R; no upper or lower extremity swelling Skin: no rash UEs, distal LEs; pannus w/o rash/erythema; skin under breasts w/o rash/erythema. Neuro: R hemiparesis, R facial droop; able to extend LLE at knee and flex LUE at shoulder however can not follow other commands  Lab Results Lab Results  Component Value Date   WBC 12.1 (H) 06/21/2017   HGB 13.4 06/21/2017   HCT 40.8 06/21/2017   MCV 86.6 06/21/2017   PLT 280 06/21/2017    Lab Results  Component Value Date   CREATININE 0.72 06/21/2017   BUN 17 06/21/2017   NA 145 06/21/2017   K 3.2 (L) 06/21/2017   CL 101 06/21/2017   CO2 29 06/21/2017    Lab Results  Component Value Date   ALT 17 06/21/2017   AST 31 06/21/2017   ALKPHOS 65 06/21/2017   BILITOT 0.8 06/21/2017    Microbiology: Recent Results (from the past 240 hour(s))  Culture, blood (Routine X 2) w Reflex to ID Panel     Status: None (Preliminary result)   Collection Time: 06/19/17  8:14 AM  Result Value Ref Range Status   Specimen Description BLOOD LEFT HAND  Final   Special Requests   Final    BOTTLES DRAWN AEROBIC AND ANAEROBIC Blood Culture adequate volume   Culture   Final    NO GROWTH 3 DAYS Performed at [facility], [address], [place], [place] [address]    Report Status PENDING  Incomplete  Culture, blood (Routine X 2) w Reflex to ID Panel     Status:  None (Preliminary result)   Collection Time: 06/19/17  8:15 AM  Result Value Ref Range Status   Specimen Description RIGHT ANTECUBITAL  Final   Special Requests   Final    BOTTLES DRAWN AEROBIC AND ANAEROBIC Blood Culture adequate volume   Culture   Final    NO GROWTH 3 DAYS Performed at [facility], [address] [place], [place] [address]    Report Status PENDING  Incomplete    Alphonzo Grieve, MD [facility] [place] [facility] [phone] pager  06/22/2017, 9:46 AM

## 2017-06-22 NOTE — Progress Notes (Signed)
Physical Therapy Session Note  Patient Details  Name: Sandra [name] MRN: [id number] Date of Birth: 1940-09-09  Today's Date: 06/22/2017 PT Individual Time: 1330-1435 PT Individual Time Calculation (min): 65 min   Short Term Goals: Week 1:  PT Short Term Goal 1 (Week 1): Pt will demonstrate bed mobility with max assist +1 using bed features PT Short Term Goal 2 (Week 1): Pt will transfer without use of lift equipment in 25% of opportunities PT Short Term Goal 3 (Week 1): Pt will scan to R environment in 50% of observations with min cues. PT Short Term Goal 4 (Week 1): Pt will tolerate out of bed to w/c for 2 hours outside of therapy.   Skilled Therapeutic Interventions/Progress Updates:    no indication of pain except restless in chair, suspect due to prolonged sitting time.  Would likely benefit from out of bed schedule or boosting schedule for pressure relief for the time being.    Pt taken to therapy gym and PT adjusted leg rests, arm rests, and head rest for improved positioning and sitting tolerance.  In mildly distracting environment, pt engaged in simple identification task from a field of two, pt able to correctly identify a color from a field of 2 in ~25% of opportunities.  PT provided hand over hand assist for pt to initiate putting bean bags back in the container.  Sit<>stand in standing frame for prolonged calf stretch and weight bearing with focus on upright posture and activation of postural muscles.  Pt able to tolerate standing ~7 minutes prior to indicating request to sit.  Noted to be incontinent of urine while standing.  Returned to room and transferred back to bed with maxi-move.  Pt able to roll with decreased assist to R this session (min to initiate), and max to L, for doffing pants, soiled brief, and donning clean brief.  Pt able to complete hygiene in front with hand over hand assist to initiate, PT provided total assist for hygiene in back.  Pt becoming increasingly drowsy  during hygiene/rolling.  Positioned in bed with call bell in reach and needs met.  Missed 30 minutes of PT due to fatigue.   Therapy Documentation Precautions:  Precautions Precautions: Fall Precaution Comments: dense R hemiparesis, R inattention Restrictions Weight Bearing Restrictions: No Other Position/Activity Restrictions: Positioning of right upper extremity in sight of patient and not pulling on it as it is flaccid.    See Function Navigator for Current Functional Status.   Therapy/Group: Individual Therapy  Michel Santee 06/22/2017, 4:27 PM

## 2017-06-22 NOTE — Progress Notes (Signed)
Subjective/Complaints: Pt remains aphasic inaccurate with Y/N, no consistent head nods Elevated temp this am 100F recorded but verbal RN to RN report 101.7 ax  ROS- cannot obtain due to aphasia  Objective: Vital Signs: Blood pressure (!) 170/80, pulse 91, temperature 100 F (37.8 C), temperature source Axillary, resp. rate 20, height 5' 5"  (1.651 m), weight 88 kg (193 lb 14.4 oz), SpO2 95 %.  Results for orders placed or performed during the hospital encounter of 06/20/17 (from the past 72 hour(s))  Glucose, capillary     Status: Abnormal   Collection Time: 06/20/17  5:16 PM  Result Value Ref Range   Glucose-Capillary 151 (H) 65 - 99 mg/dL  Glucose, capillary     Status: Abnormal   Collection Time: 06/20/17  9:22 PM  Result Value Ref Range   Glucose-Capillary 115 (H) 65 - 99 mg/dL  CBC WITH DIFFERENTIAL     Status: Abnormal   Collection Time: 06/21/17  6:08 AM  Result Value Ref Range   WBC 12.1 (H) 4.0 - 10.5 K/uL   RBC 4.71 3.87 - 5.11 MIL/uL   Hemoglobin 13.4 12.0 - 15.0 g/dL   HCT 40.8 36.0 - 46.0 %   MCV 86.6 78.0 - 100.0 fL   MCH 28.5 26.0 - 34.0 pg   MCHC 32.8 30.0 - 36.0 g/dL   RDW 14.3 11.5 - 15.5 %   Platelets 280 150 - 400 K/uL   Neutrophils Relative % 79 %   Neutro Abs 9.6 (H) 1.7 - 7.7 K/uL   Lymphocytes Relative 9 %   Lymphs Abs 1.0 0.7 - 4.0 K/uL   Monocytes Relative 12 %   Monocytes Absolute 1.4 (H) 0.1 - 1.0 K/uL   Eosinophils Relative 0 %   Eosinophils Absolute 0.0 0.0 - 0.7 K/uL   Basophils Relative 0 %   Basophils Absolute 0.0 0.0 - 0.1 K/uL    Comment: Performed at [facility], [address], [place], [place] [address]  Comprehensive metabolic panel     Status: Abnormal   Collection Time: 06/21/17  6:08 AM  Result Value Ref Range   Sodium 145 135 - 145 mmol/L   Potassium 3.2 (L) 3.5 - 5.1 mmol/L   Chloride 101 101 - 111 mmol/L   CO2 29 22 - 32 mmol/L   Glucose, Bld 239 (H) 65 - 99 mg/dL   BUN 17 6 - 20 mg/dL   Creatinine, Ser 0.72 0.44  - 1.00 mg/dL   Calcium 8.7 (L) 8.9 - 10.3 mg/dL   Total Protein 6.6 6.5 - 8.1 g/dL   Albumin 2.4 (L) 3.5 - 5.0 g/dL   AST 31 15 - 41 U/L   ALT 17 14 - 54 U/L   Alkaline Phosphatase 65 38 - 126 U/L   Total Bilirubin 0.8 0.3 - 1.2 mg/dL   GFR calc non Af Amer >60 >60 mL/min   GFR calc Af Amer >60 >60 mL/min    Comment: (NOTE) The eGFR has been calculated using the CKD EPI equation. This calculation has not been validated in all clinical situations. eGFR's persistently <60 mL/min signify possible Chronic Kidney Disease.    Anion gap 15 5 - 15    Comment: Performed at [facility] [address] [place], [place] [address]  Glucose, capillary     Status: Abnormal   Collection Time: 06/21/17  6:51 AM  Result Value Ref Range   Glucose-Capillary 193 (H) 65 - 99 mg/dL  Glucose, capillary  Status: Abnormal   Collection Time: 06/21/17 11:57 AM  Result Value Ref Range   Glucose-Capillary 281 (H) 65 - 99 mg/dL  Glucose, capillary     Status: Abnormal   Collection Time: 06/21/17  4:40 PM  Result Value Ref Range   Glucose-Capillary 276 (H) 65 - 99 mg/dL  Glucose, capillary     Status: Abnormal   Collection Time: 06/21/17  9:34 PM  Result Value Ref Range   Glucose-Capillary 231 (H) 65 - 99 mg/dL   Comment 1 Notify RN   Glucose, capillary     Status: Abnormal   Collection Time: 06/22/17  7:15 AM  Result Value Ref Range   Glucose-Capillary 199 (H) 65 - 99 mg/dL     HEENT: normal Cardio: RRR and no murmur Resp: CTA B/L and unlabored GI: BS positive and NT, ND Extremity:  Pulses positive and No Edema Skin:   Other IV site CDI Neuro: Flat, Abnormal Sensory no withdrawl to pinch in RLE, Abnormal Motor 0/5 in RUE adn RLE, Aphasic and Inattention Musc/Skel:  Other flexor withdrawl LLE Gen NAD   Assessment/Plan: 1. Functional deficits secondary to Left MCA infarct with R HP, Aphasia which require 3+ hours per day of interdisciplinary therapy in a comprehensive inpatient rehab  setting. Physiatrist is providing close team supervision and 24 hour management of active medical problems listed below. Physiatrist and rehab team continue to assess barriers to discharge/monitor patient progress toward functional and medical goals. FIM: Function - Bathing Body parts bathed by helper: Right lower leg, Right arm, Left arm, Left lower leg, Chest, Back, Abdomen, Front perineal area, Buttocks, Right upper leg, Left upper leg Assist Level: 2 helpers  Function- Upper Body Dressing/Undressing What is the patient wearing?: Bra, Pull over shirt/dress Bra - Perfomed by helper: Thread/unthread right bra strap, Thread/unthread left bra strap, Hook/unhook bra (pull down sports bra) Pull over shirt/dress - Perfomed by helper: Thread/unthread right sleeve, Thread/unthread left sleeve, Put head through opening, Pull shirt over trunk Assist Level: (total A) Function - Lower Body Dressing/Undressing What is the patient wearing?: Socks, Shoes, Pants Pants- Performed by helper: Thread/unthread right pants leg, Thread/unthread left pants leg, Pull pants up/down Socks - Performed by helper: Don/doff right sock, Don/doff left sock Shoes - Performed by helper: Don/doff right shoe, Don/doff left shoe(slip on) Assist for lower body dressing: 2 Helpers  Function - Toileting Toileting activity did not occur: No continent bowel/bladder event Toileting steps completed by helper: Adjust clothing prior to toileting, Performs perineal hygiene, Adjust clothing after toileting Assist level: Touching or steadying assistance (Pt.75%)  Function - Air cabin crew transfer activity did not occur: Safety/medical concerns  Function - Chair/bed transfer Chair/bed transfer activity did not occur: Safety/medical concerns  Function - LocomotionScientist, water quality activity did not occur: Safety/medical concerns Wheel 50 feet with 2 turns activity did not occur: Safety/medical concerns Wheel 150 feet  activity did not occur: Safety/medical concerns Function - Locomotion: Ambulation Ambulation activity did not occur: Safety/medical concerns Walk 10 feet activity did not occur: Safety/medical concerns Walk 50 feet with 2 turns activity did not occur: Safety/medical concerns Walk 150 feet activity did not occur: Safety/medical concerns Walk 10 feet on uneven surfaces activity did not occur: Safety/medical concerns  Function - Comprehension Comprehension: Auditory, Visual Comprehension assist level: Understands basic 25 - 49% of the time/ requires cueing 50 - 75% of the time  Function - Expression Expression: Nonverbal Expression assist level: Expresses basis less than 25% of the time/requires cueing >75% of the  time.  Function - Social Interaction Social Interaction assist level: Interacts appropriately 25 - 49% of time - Needs frequent redirection.  Function - Problem Solving Problem solving assist level: Solves basic less than 25% of the time - needs direction nearly all the time or does not effectively solve problems and may need a restraint for safety  Function - Memory Memory assist level: Recognizes or recalls less than 25% of the time/requires cueing greater than 75% of the time Patient normally able to recall (first 3 days only): None of the above  Medical Problem List and Plan: 1. Functional deficits secondary to  due to L-MCA stroke with extension - Aphasia with Right spastic hemiplegia Team conference today please see physician documentation under team conference tab, met with team face-to-face to discuss problems,progress, and goals. Formulized individual treatment plan based on medical history, underlying problem and comorbidities. 2.  DVT Prophylaxis/Anticoagulation: Pharmaceutical: Lovenox 3. Pain Management: tylenol prn 4. Mood: LCSW to follow for evaluation when appropriate.  5. Neuropsych: This patient is not capable of making decisions on her own behalf. 6.  Skin/Wound Care: Routine pressure relief measures. Maintain adequate nutritional and hydration status.  7. Fluids/Electrolytes/Nutrition: Monitor I/O. Offer supplements prn if intake is poor. Will check lytes in am. 8. T2DM: Hgb A1C- Monitor BS ac/hs and use SSI for elevated BS. Continue to titrate medications for tighter control.  CBG (last 3)  Recent Labs    06/21/17 1640 06/21/17 2134 06/22/17 0715  GLUCAP 276* 231* 199*  on Lantus 24U qhs increase to 27U, on metformin at home, 8U novalog with meals 9. HTN: Poorly controlled due to compliance issues.  Vitals:   06/22/17 0500 06/22/17 0649  BP:    Pulse:    Resp:    Temp:  100 F (37.8 C)  SpO2: 95%   ~7d post CVA, start low dose amlodipine 10. Fever with leucocytosis: Continue Unasyn D# 3. CXR neg , repeat urine neg, no DVT,recent [facility] no evidence of endocarditis no other symptoms, ask for ID consult FUO Tmax report from night RN to day RN was 101.7 axillary 11. Dysphagia: Continue dysphagia 1, nectar liquids with assistance at meals to help maintain adequate hydration.  12. Recent episode of  Depression: Now onCelexa last month.   13. Obesity             Body mass index is 32.91 kg/m.             Diet and exercise education             Will cont to encourage weight loss to increase endurance and promote overall health 14.  Hypokalemia on supplementation   LOS (Days) 2 A FACE TO FACE EVALUATION WAS PERFORMED  Charlett Blake 06/22/2017, 9:01 AM

## 2017-06-23 ENCOUNTER — Inpatient Hospital Stay (HOSPITAL_COMMUNITY): Payer: Medicare Other | Admitting: Physical Therapy

## 2017-06-23 ENCOUNTER — Inpatient Hospital Stay (HOSPITAL_COMMUNITY): Payer: Medicare Other | Admitting: Speech Pathology

## 2017-06-23 ENCOUNTER — Inpatient Hospital Stay (HOSPITAL_COMMUNITY): Payer: Medicare Other | Admitting: Occupational Therapy

## 2017-06-23 DIAGNOSIS — R4701 Aphasia: Secondary | ICD-10-CM

## 2017-06-23 DIAGNOSIS — I63411 Cerebral infarction due to embolism of right middle cerebral artery: Secondary | ICD-10-CM

## 2017-06-23 DIAGNOSIS — E785 Hyperlipidemia, unspecified: Secondary | ICD-10-CM

## 2017-06-23 LAB — GLUCOSE, CAPILLARY
GLUCOSE-CAPILLARY: 172 mg/dL — AB (ref 65–99)
GLUCOSE-CAPILLARY: 270 mg/dL — AB (ref 65–99)
GLUCOSE-CAPILLARY: 224 mg/dL — AB (ref 65–99)
GLUCOSE-CAPILLARY: 197 mg/dL — AB (ref 65–99)

## 2017-06-23 LAB — HEPATITIS PANEL, ACUTE
HCV Ab: <0.1 s/co ratio (ref 0.0–0.9)
HEP A IGM: NEGATIVE
HEP B C IGM: NEGATIVE
Hepatitis B Surface Ag: NEGATIVE

## 2017-06-23 LAB — HIV ANTIBODY (ROUTINE TESTING W REFLEX): HIV Screen 4th Generation wRfx: NONREACTIVE

## 2017-06-23 LAB — URINE CULTURE: Culture: NO GROWTH

## 2017-06-23 MED ORDER — INSULIN GLARGINE 100 UNIT/ML ~~LOC~~ SOLN
30.0000 [IU] | Freq: Every day | SUBCUTANEOUS | Status: DC
  Filled 2017-06-23: qty 0.3

## 2017-06-23 NOTE — Progress Notes (Signed)
Social Work   Josephmichael Lisenbee, Eliezer Champagne  Social Worker  Physical Medicine and Rehabilitation  Patient Care Conference  Signed  Date of Service:  06/22/2017  1:06 PM          Signed          [] Hide copied text  [] Hover for details   Inpatient RehabilitationTeam Conference and Plan of Care Update Date: 06/22/2017   Time: 11:00 Am      Patient Name: Sandra [name]      Medical Record Number: [id number]  Date of Birth: Aug 24, 1940 Sex: Female         Room/Bed: 4W19C/4W19C-01 Payor Info: Payor: MEDICARE / Plan: MEDICARE PART A AND B / Product Type: *No Product type* /     Admitting Diagnosis: L CVA  Admit Date/Time:  06/20/2017  2:42 PM Admission Comments: No comment available    Primary Diagnosis:  <principal problem not specified> Principal Problem: <principal problem not specified>       Patient Active Problem List    Diagnosis Date Noted  . Acute ischemic right MCA stroke ([place]) 06/20/2017  . Acute ischemic stroke ([place])    . Diabetes mellitus type 2 in obese ([place])    . Benign essential HTN    . Noncompliance    . FUO (fever of unknown origin)    . Leukocytosis    . Dysphagia, post-stroke    . TIA (transient ischemic attack) 06/15/2017  . Hyperlipidemia associated with type 2 diabetes mellitus ([place]) 06/14/2017  . Type 2 diabetes mellitus with neurological complications ([place]) 81/05/7508  . Stroke ([place]) 03/31/2017  . History of adenomatous polyp of colon 02/11/2017  . Dysphagia 11/19/2016  . Essential hypertension 08/12/2016  . Hemorrhoids 06/16/2016  . Rectal bleeding 06/16/2016  . Constipation 06/16/2016      Expected Discharge Date:     Team Members Present: Physician leading conference: Dr. Alysia Penna Social Worker Present: Ovidio Kin, LCSW Nurse Present: Brita Romp, RN PT Present: Dwyane Dee, PT OT Present: Cherylynn Ridges, OT SLP Present: Weston Anna, SLP PPS Coordinator present : Daiva Nakayama, RN, CRRN       Current  Status/Progress Goal Weekly Team Focus  Medical     Fever of unknown origin, global aphasic, D1 nectar with poor appetite  reduce aspiration risk, ensure adequate nutrition  work up of FUO   Bowel/Bladder     Incontinent of B&B.  Keep clean and dry.  Assess toileting needs q shift and PRN.   Swallow/Nutrition/ Hydration     dys 1, nectar thick liquids; full staff supervision   min assist   trials of thin liquids, strategies to contain boluses and clear oral residue    ADL's     Total A +2   min/Mod A overall  R NMR, sitting balance/tolerance, transfer training, attention, initiation    Mobility     +2 total for all mobility, mechanical lifts for transfers  lofty mod/max assist  arousal, attention, visual scanning, functional mobility as able   Communication     nonverbal, max to total assist to follow commands, answer yes/no questions  mod assist   auditory comprehension (ie yes/no questions, following commands), vocalization   Safety/Cognition/ Behavioral Observations   max to total assist   min assist   scanning to the right of midline, sustained attention to basic, familiar tasks    Pain     No s/s of pain.  <2.  Assess q shift and PRN for pain.   Skin  No complications.  Maintain skin integrity.  Assess skin q shift and PRN.     *See Care Plan and progress notes for long and short-term goals.      Barriers to Discharge   Current Status/Progress Possible Resolutions Date Resolved   Physician     Inaccessible home environment;Medical stability;Incontinence;Wound Care  +2 total A with severe global aphasia  initiating rehab program  ID consult for FUO      Nursing                 PT  Inaccessible home environment;Decreased caregiver support;Incontinence;Insurance for SNF coverage                 OT Inaccessible home environment;Decreased caregiver support;Home environment access/layout;Weight  (4 stairs to enter house with no railing)             SLP            SW  Decreased caregiver support [name] is of small stature and pt is a large woman             Discharge Planning/Teaching Needs:  Family wants to take home but daughter in-law is small and can not provide much physical assist. Son is a PT at [employer]. Await progress here.      Team Discussion:  Goals set for min/mod level of assist. Currently plus 2 total assist. Consulted ID to evaluate regarding higher white count. Checking UA again. Poor iniatiation. Incontinent B & B. Dye 1 nectar trials of thin. Poor po intake. Maybe set discharge target date next week. Will be much care for family.  Revisions to Treatment Plan:  New eval set target discharge date next week    Continued Need for Acute Rehabilitation Level of Care: The patient requires daily medical management by a physician with specialized training in physical medicine and rehabilitation for the following conditions: Daily direction of a multidisciplinary physical rehabilitation program to ensure safe treatment while eliciting the highest outcome that is of practical value to the patient.: Yes Daily medical management of patient stability for increased activity during participation in an intensive rehabilitation regime.: Yes Daily analysis of laboratory values and/or radiology reports with any subsequent need for medication adjustment of medical intervention for : Other;Neurological problems   Elease Hashimoto 06/22/2017, 1:06 PM                 Patient ID: [name], female   DOB: [birth year], 77 y.o.   MRN: [id number]

## 2017-06-23 NOTE — Progress Notes (Signed)
Physical Therapy Session Note  Patient Details  Name: Sandra [name] MRN: [id number] Date of Birth: 29-Jul-1940  Today's Date: 06/23/2017 PT Individual Time: 1100-1200 PT Individual Time Calculation (min): 60 min   Short Term Goals: Week 1:  PT Short Term Goal 1 (Week 1): Pt will demonstrate bed mobility with max assist +1 using bed features PT Short Term Goal 2 (Week 1): Pt will transfer without use of lift equipment in 25% of opportunities PT Short Term Goal 3 (Week 1): Pt will scan to R environment in 50% of observations with min cues. PT Short Term Goal 4 (Week 1): Pt will tolerate out of bed to w/c for 2 hours outside of therapy.   Skilled Therapeutic Interventions/Progress Updates:    no c/o pain.  Session focus on attention and postural control.    Pt transitioned to standing frame with +2 assist.  Standing frame x10 minutes focus on equal weight bearing RLE/LLE and upright posture with mod/max multimodal cues to attend.  Transition to therapy gym and pt requires total assist +2 to transfer with squat/pivot from w/c to therapy gym on pt's L.  Pt appears to assist 0% with transfer.  Sitting edge of therapy mat focus on trunk control, upright posture, and weight shifting L.  Pt requires overall min assist to maintain static sitting balance with mod cues for upright/midline posture.  Progress to reaching L for weight shift, pt initial requires total facilitation of L weight shift, progress to min assist to initiate L weight shift.  Pt engaged in reaching task for horse shoes with focus on L weight shift, and reaching across midline for visual scanning to R.  Pt returned to w/c at end of session, requires total assist +2.  Returned to bed using hoyer lift.  Call bell in reach and needs met.   Therapy Documentation Precautions:  Precautions Precautions: Fall Precaution Comments: dense R hemiparesis, R inattention Restrictions Weight Bearing Restrictions: No Other Position/Activity  Restrictions: Positioning of right upper extremity in sight of patient and not pulling on it as it is flaccid.    See Function Navigator for Current Functional Status.   Therapy/Group: Individual Therapy  Michel Santee 06/23/2017, 2:25 PM

## 2017-06-23 NOTE — IPOC Note (Signed)
Overall Plan of Care [facility]) Patient Details Name: Sandra [name] MRN: [id number] DOB: [birth year]  Admitting Diagnosis: <principal problem not specified>  Hospital Problems: Active Problems:   Stroke [facility])   Acute ischemic right MCA stroke [facility])   Follow-up exam   Aphasia   Hemiparesthesia   Hemiparesis of right nondominant side as late effect of cerebral infarction [facility])     Functional Problem List: Nursing Bladder, Bowel, Endurance, Medication Management, Motor, Nutrition, Perception, Sensory, Safety, Skin Integrity  PT Balance, Endurance, Motor, Perception, Safety, Sensory, Skin Integrity  OT Balance, Cognition, Edema, Endurance, Motor, Nutrition, Perception, Safety, Sensory, Vision  SLP Cognition, Nutrition, Linguistic  TR         Basic ADL's: OT Eating, Grooming, Bathing, Dressing, Toileting     Advanced  ADL's: OT       Transfers: PT Bed Mobility, Bed to Chair, Car, Manufacturing systems engineer, Metallurgist: PT Ambulation, Emergency planning/management officer, Stairs     Additional Impairments: OT Fuctional Use of Upper Extremity  SLP Swallowing, Communication, Social Cognition comprehension, expression Problem Solving, Attention  TR      Anticipated Outcomes Item Anticipated Outcome  Self Feeding min A  Swallowing      Basic self-care  MIN A UB dressing; MOD A LB dressing  Toileting  MOD A    Bathroom Transfers MOD A  Bowel/Bladder  Pt will manage bowel a;nd bladder with max assist at discharge  Transfers  mod assist  Locomotion  max assist with PT for ambulation, supervision for w/c   Communication     Cognition     Pain  Pt will manage pain at 3 or less on a scale of 0-10 with min assist   Safety/Judgment  Pt will demonstrate increased safety awareness at time of discharge with min assist/cues.    Therapy Plan: PT Intensity: Minimum of 1-2 x/day ,45 to 90 minutes PT Frequency: 5 out of 7 days PT Duration Estimated Length of Stay: 28-32  days OT Intensity: Minimum of 1-2 x/day, 45 to 90 minutes OT Frequency: 5 out of 7 days OT Duration/Estimated Length of Stay: 21-28 days SLP Intensity: Minumum of 1-2 x/day, 30 to 90 minutes SLP Frequency: 3 to 5 out of 7 days SLP Duration/Estimated Length of Stay: 21-28 days    Team Interventions: Nursing Interventions Patient/Family Education, Bladder Management, Medication Management, Discharge Planning, Dysphagia/Aspiration Precaution Training, Pain Management, Cognitive Remediation/Compensation, Skin Care/Wound Management, Bowel Management  PT interventions Ambulation/gait training, Community reintegration, DME/adaptive equipment instruction, Neuromuscular re-education, Psychosocial support, Stair training, UE/LE Strength taining/ROM, Wheelchair propulsion/positioning, UE/LE Coordination activities, Therapeutic Activities, Functional electrical stimulation, Discharge planning, Training and development officer, Cognitive remediation/compensation, Functional mobility training, Patient/family education, Therapeutic Exercise, Splinting/orthotics, Visual/perceptual remediation/compensation  OT Interventions Balance/vestibular training, Discharge planning, Functional electrical stimulation, Pain management, Self Care/advanced ADL retraining, UE/LE Coordination activities, Therapeutic Activities, Cognitive remediation/compensation, Disease mangement/prevention, Functional mobility training, Patient/family education, Skin care/wound managment, Therapeutic Exercise, Visual/perceptual remediation/compensation, DME/adaptive equipment instruction, Neuromuscular re-education, Psychosocial support, Splinting/orthotics, UE/LE Strength taining/ROM, Wheelchair propulsion/positioning, Community reintegration  SLP Interventions Cognitive remediation/compensation, English as a second language teacher, Dysphagia/aspiration precaution training, Functional tasks, Environmental controls, Internal/external aids, Multimodal communication approach,  Patient/family education, Speech/Language facilitation  TR Interventions    SW/CM Interventions Discharge Planning, Psychosocial Support, Patient/Family Education   Barriers to Discharge MD  Medical stability, IV antibiotics, Incontinence, Neurogenic bowel and bladder and Nutritional means  Nursing      PT Inaccessible home environment, Decreased caregiver support, Incontinence, Insurance for SNF coverage    OT Inaccessible home environment,  Decreased caregiver support, Home environment access/layout, Weight (4 stairs to enter house with no railing)  SLP      SW Decreased caregiver support [name] is of small stature and pt is a large woman   Team Discharge Planning: Destination: PT-(tbd) ,OT- Home , SLP-Home Projected Follow-up: PT-24 hour supervision/assistance, Home health PT(will need SNF placement if family unable to provide heavy assist anticipated), OT-  Home health OT, SLP-Home Health SLP, Outpatient SLP, Skilled Nursing facility, 24 hour supervision/assistance Projected Equipment Needs: PT-To be determined, OT- 3 in 1 bedside comode, Tub/shower bench, SLP-To be determined Equipment Details: PT- , OT-  Patient/family involved in discharge planning: PT- Patient unable/family or caregiver not available,  OT-Family member/caregiver, SLP-Patient unable/family or caregive not available  MD ELOS: 22-27d Medical Rehab Prognosis:  Good Assessment:  77 y.o.female with history of HTN, T2DM, diverticulosis, recent issues with depression and had stopped taking all medication except insulin for few weeks. History taken from chart review. She was admitted to [facility] on 06/15/17 with elevated BP, confusion and 2 week history of dysarthria. 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  MRI/MRA brain done revealing moderate size  L-MCA infarct affecting posterior lateral frontal lobe and multiple missing L-MCA branch vessels and moderately advanced chronic SVD.  2D echo showed EF 60-65% with no wall abnormality and mildly calcified MV and AV. Carotid dopplers with < 50% ICA stenosis. Swallow evaluation done and patient placed on dysphagia 1, nectar liquids with water protocol.  She had acute changes in mental status with lethargy, minimal verbal output and CT head repeated 02/1 reviewed, showing without changes to left MCA infarct.  Negative for bleed but she developed fever 101.4 with and leucocytosis on early am of 2/2.  Blood cultures done for work up and she was started on Unasyn due to concerns of aspiration PNA. She has had increase in right facial weakness and RUE weakness and MBS repeated 02/4 due to functional decline with inability to elicit cough.     Now requiring 24/7 Rehab RN,MD, as well as CIR level PT, OT and SLP.  Treatment team will focus on ADLs and mobility with goals set at [facility] but may only be able to achieve maxA   See Team Conference Notes for weekly updates to the plan of care

## 2017-06-23 NOTE — Progress Notes (Signed)
Social Work Patient ID: Sandra [name], female   DOB: 01-18-1941, 77 y.o.   MRN: [id number]  Met with daughter in-law, pt;s brother and pt to disucss team conference yesterday, goals set at min/mod level and no target date was set due to only seeing pt for one day. Want to wait until next conference to set a target date and establish goals. Daughter in-law is willing to take care of pt if she is able to do so. She plans to be here daily and observe her in thera[pies. Both report she looks better today and is eating better. Aware of MD's concern of her poor po intake. Will work on a realistic discharge plan.

## 2017-06-23 NOTE — Progress Notes (Signed)
Occupational Therapy Session Note  Patient Details  Name: Sandra [name] MRN: [id number] Date of Birth: Jun 19, 1940  Today's Date: 06/23/2017 OT Individual Time: 3403-5248 OT Individual Time Calculation (min): 73 min   Short Term Goals: Week 1:  OT Short Term Goal 1 (Week 1): Pt will maintain static sitting balance for  5 min EOM/EOB with min A in prep for toileting OT Short Term Goal 2 (Week 1): Pt will locate 2/2 grooming items on R side of sink with MOD VC OT Short Term Goal 3 (Week 1): Pt will manage RUE with LUE prior to transfer with MOD VC to demonstrate R body attention OT Short Term Goal 4 (Week 1): Pt will transfer to w/c with MAX A of 1 in prep for [facility] transfer  Skilled Therapeutic Interventions/Progress Updates:    OT treatment session focused on L NMR, attention, initiation, sitting balance, and improved sit<>stand. Pt brought into gravity eliminated side-lying position in bed with max A to roll to the L. R UE NMR with joint input to bring pt through full ROM. Pt with trace scap protraction/retraction. Pt brought to sitting EOB with total A and worked on sitting balance with pt able to achieve midline with min A overall. Worked on following one-step commands to reach for objects in sitting with pt following about 50% of 1 step commands in quiet environment. Pt completed 5 sit<>stands with bariatric Stedy with Max A x2 helpers. Pt took seated rest break, then transferred to TIS wc with +3 assistance 2/2 heavy push to R. Pt left tilted in wc with needs met and safety belt on.   Therapy Documentation Precautions:  Precautions Precautions: Fall Precaution Comments: dense R hemiparesis, R inattention Restrictions Weight Bearing Restrictions: No Other Position/Activity Restrictions: Positioning of right upper extremity in sight of patient and not pulling on it as it is flaccid.   See Function Navigator for Current Functional Status.   Therapy/Group: Individual Therapy  Valma Cava 06/23/2017, 3:41 PM

## 2017-06-23 NOTE — Progress Notes (Signed)
Speech Language Pathology Daily Session Note  Patient Details  Name: Sandra [name] MRN: [id number] Date of Birth: 1940-08-22  Today's Date: 06/23/2017   Skilled treatment session #1 SLP Individual Time: 0350-0938 SLP Individual Time Calculation (min): 45 min   Skilled treatment session #2 SLP Individual Time: 1829-9371 SLP Individual Time Calculation (min) 30 min  Short Term Goals: Week 1: SLP Short Term Goal 1 (Week 1): Pt will answer yes/no immediate, personally relevant questions in 50% of opportunities with max assist multimodal cues.   SLP Short Term Goal 2 (Week 1): Pt will follow 1 step commands for 50% accuracy during basic, familiar tasks with max assist multimodal cues.   SLP Short Term Goal 3 (Week 1): Pt will verbalize at the vocalic syllable level in 25% of opportunities with max assist multimodal cues.   SLP Short Term Goal 4 (Week 1): Pt will sustain her attention to basic, familiar tasks for 1 minute intervals with max verbal cues for redirection.   SLP Short Term Goal 5 (Week 1): Pt will consume dys 1 textures and nectar thick liquids with mod verbal cues for use of swallowing precautions and minimal overt s/s of aspiration.   SLP Short Term Goal 6 (Week 1): Pt will consume therapeutic trials of thin liquids with mod verbal cues for use of swallowing precautions and minimal overt s/s of aspiration over 3 targeted sessions prior to repeat objective assessment.   Skilled Therapeutic Interventions:   Skilled treatment session #1 focused on cognition and dysphagia goals. SLP received pt in bed and asissted with nursing to transfer pt out of bed and into wheelchair. Pt required Total A to follow 1 step directions for bed mobility to transfer as well as Total A to sustain A to transfer. Pt with gruntal noises during transfer but unable to imitate any vocalizations during session. Pt required Mod A multimodal cues to maintain neutral head posture while up in wheelchair. SLP  further facilitated session by providing skilled observation of pt consuming thin liquids via cup. Pt with 1 delayed cough thru consumption of 8 oz. Pt's daughter-in-law present and states that she is married to a PT at [facility]. However, daughter-in-law didn't demonstrate any functional knowledge of CVA, aphasia, dysphagia etc. SLP attempted education but will require much more education. Daughter-in-law reports that pt was living with them and didn't perform any ADLs within family (just her own).   Skilled treatment session #2 focused on communication and dysphagia goals. SLP facilitated session by providing simple yes/no questions about immediate environment. Pt with < 25% accuracy despite Max A cues. SLP further facilitated session by providing skilled observation of pt consuming thin liquids via cup. Pt consumed 8 oz with 1 delayed cough. Order written for water protocol. Education posted in pt's room.      Function:  Eating Eating   Modified Consistency Diet: No(Trials of water with SLP) Eating Assist Level: Help with picking up utensils;Help managing cup/glass;Supervision or verbal cues;Set up assist for           Cognition Comprehension Comprehension assist level: Understands basic less than 25% of the time/ requires cueing >75% of the time  Expression   Expression assist level: Expresses basis less than 25% of the time/requires cueing >75% of the time.  Social Interaction Social Interaction assist level: Interacts appropriately less than 25% of the time. May be withdrawn or combative.;Interacts appropriately 25 - 49% of time - Needs frequent redirection.  Problem Solving Problem solving assist level: Solves basic less  than 25% of the time - needs direction nearly all the time or does not effectively solve problems and may need a restraint for safety  Memory Memory assist level: Recognizes or recalls less than 25% of the time/requires cueing greater than 75% of the time     Pain Pain Assessment Pain Assessment: 0-10 Pain Score: 0-No pain  Therapy/Group: Individual Therapy  Sandra [name] 06/23/2017, 12:54 PM

## 2017-06-23 NOTE — Progress Notes (Signed)
Subjective/Complaints: Pt remains aphasic inaccurate with Y/N, no consistent head nods Appreciate ID consult  ROS- cannot obtain due to aphasia  Objective: Vital Signs: Blood pressure (!) 168/80, pulse 80, temperature 99.1 F (37.3 C), temperature source Axillary, resp. rate 16, height 5' 5"  (1.651 m), weight 88 kg (193 lb 14.4 oz), SpO2 94 %.  Results for orders placed or performed during the hospital encounter of 06/20/17 (from the past 72 hour(s))  Glucose, capillary     Status: Abnormal   Collection Time: 06/20/17  5:16 PM  Result Value Ref Range   Glucose-Capillary 151 (H) 65 - 99 mg/dL  Glucose, capillary     Status: Abnormal   Collection Time: 06/20/17  9:22 PM  Result Value Ref Range   Glucose-Capillary 115 (H) 65 - 99 mg/dL  CBC WITH DIFFERENTIAL     Status: Abnormal   Collection Time: 06/21/17  6:08 AM  Result Value Ref Range   WBC 12.1 (H) 4.0 - 10.5 K/uL   RBC 4.71 3.87 - 5.11 MIL/uL   Hemoglobin 13.4 12.0 - 15.0 g/dL   HCT 40.8 36.0 - 46.0 %   MCV 86.6 78.0 - 100.0 fL   MCH 28.5 26.0 - 34.0 pg   MCHC 32.8 30.0 - 36.0 g/dL   RDW 14.3 11.5 - 15.5 %   Platelets 280 150 - 400 K/uL   Neutrophils Relative % 79 %   Neutro Abs 9.6 (H) 1.7 - 7.7 K/uL   Lymphocytes Relative 9 %   Lymphs Abs 1.0 0.7 - 4.0 K/uL   Monocytes Relative 12 %   Monocytes Absolute 1.4 (H) 0.1 - 1.0 K/uL   Eosinophils Relative 0 %   Eosinophils Absolute 0.0 0.0 - 0.7 K/uL   Basophils Relative 0 %   Basophils Absolute 0.0 0.0 - 0.1 K/uL    Comment: Performed at [facility], [address] [place], [place] [address]  Comprehensive metabolic panel     Status: Abnormal   Collection Time: 06/21/17  6:08 AM  Result Value Ref Range   Sodium 145 135 - 145 mmol/L   Potassium 3.2 (L) 3.5 - 5.1 mmol/L   Chloride 101 101 - 111 mmol/L   CO2 29 22 - 32 mmol/L   Glucose, Bld 239 (H) 65 - 99 mg/dL   BUN 17 6 - 20 mg/dL   Creatinine, Ser 0.72 0.44 - 1.00 mg/dL   Calcium 8.7 (L) 8.9 - 10.3 mg/dL    Total Protein 6.6 6.5 - 8.1 g/dL   Albumin 2.4 (L) 3.5 - 5.0 g/dL   AST 31 15 - 41 U/L   ALT 17 14 - 54 U/L   Alkaline Phosphatase 65 38 - 126 U/L   Total Bilirubin 0.8 0.3 - 1.2 mg/dL   GFR calc non Af Amer >60 >60 mL/min   GFR calc Af Amer >60 >60 mL/min    Comment: (NOTE) The eGFR has been calculated using the CKD EPI equation. This calculation has not been validated in all clinical situations. eGFR's persistently <60 mL/min signify possible Chronic Kidney Disease.    Anion gap 15 5 - 15    Comment: Performed at [facility] [address], [place], [place] [address]  Glucose, capillary     Status: Abnormal   Collection Time: 06/21/17  6:51 AM  Result Value Ref Range   Glucose-Capillary 193 (H) 65 - 99 mg/dL  Glucose, capillary     Status: Abnormal   Collection Time: 06/21/17 11:57 AM  Result Value Ref Range   Glucose-Capillary 281 (H) 65 - 99 mg/dL  Glucose, capillary     Status: Abnormal   Collection Time: 06/21/17  4:40 PM  Result Value Ref Range   Glucose-Capillary 276 (H) 65 - 99 mg/dL  Glucose, capillary     Status: Abnormal   Collection Time: 06/21/17  9:34 PM  Result Value Ref Range   Glucose-Capillary 231 (H) 65 - 99 mg/dL   Comment 1 Notify RN   Glucose, capillary     Status: Abnormal   Collection Time: 06/22/17  7:15 AM  Result Value Ref Range   Glucose-Capillary 199 (H) 65 - 99 mg/dL  CBC with Differential/Platelet     Status: Abnormal   Collection Time: 06/22/17  9:58 AM  Result Value Ref Range   WBC 10.8 (H) 4.0 - 10.5 K/uL   RBC 4.64 3.87 - 5.11 MIL/uL   Hemoglobin 13.5 12.0 - 15.0 g/dL   HCT 41.2 36.0 - 46.0 %   MCV 88.8 78.0 - 100.0 fL   MCH 29.1 26.0 - 34.0 pg   MCHC 32.8 30.0 - 36.0 g/dL   RDW 15.2 11.5 - 15.5 %   Platelets 287 150 - 400 K/uL   Neutrophils Relative % 70 %   Neutro Abs 7.5 1.7 - 7.7 K/uL   Lymphocytes Relative 20 %   Lymphs Abs 2.2 0.7 - 4.0 K/uL   Monocytes Relative 10 %   Monocytes Absolute 1.1 (H) 0.1 - 1.0 K/uL    Eosinophils Relative 0 %   Eosinophils Absolute 0.0 0.0 - 0.7 K/uL   Basophils Relative 0 %   Basophils Absolute 0.0 0.0 - 0.1 K/uL    Comment: Performed at [facility], [address], [place], [place] [address]  Glucose, capillary     Status: Abnormal   Collection Time: 06/22/17 11:45 AM  Result Value Ref Range   Glucose-Capillary 219 (H) 65 - 99 mg/dL  Glucose, capillary     Status: Abnormal   Collection Time: 06/22/17  5:08 PM  Result Value Ref Range   Glucose-Capillary 285 (H) 65 - 99 mg/dL  Urinalysis, Routine w reflex microscopic     Status: Abnormal   Collection Time: 06/22/17  5:24 PM  Result Value Ref Range   Color, Urine YELLOW YELLOW   APPearance CLEAR CLEAR   Specific Gravity, Urine 1.020 1.005 - 1.030   pH 6.0 5.0 - 8.0   Glucose, UA >=500 (A) NEGATIVE mg/dL   Hgb urine dipstick NEGATIVE NEGATIVE   Bilirubin Urine NEGATIVE NEGATIVE   Ketones, ur NEGATIVE NEGATIVE mg/dL   Protein, ur 100 (A) NEGATIVE mg/dL   Nitrite NEGATIVE NEGATIVE   Leukocytes, UA NEGATIVE NEGATIVE   RBC / HPF 0-5 0 - 5 RBC/hpf   WBC, UA 0-5 0 - 5 WBC/hpf   Bacteria, UA RARE (A) NONE SEEN   Squamous Epithelial / LPF 0-5 (A) NONE SEEN   Mucus PRESENT     Comment: Performed at [facility], [address], [place], [place] [address]  Glucose, capillary     Status: Abnormal   Collection Time: 06/22/17  8:39 PM  Result Value Ref Range   Glucose-Capillary 273 (H) 65 - 99 mg/dL  Glucose, capillary     Status: Abnormal   Collection Time: 06/23/17  6:32 AM  Result Value Ref Range   Glucose-Capillary 172 (H) 65 - 99 mg/dL     HEENT: normal Cardio: RRR and no murmur Resp: CTA B/L and  unlabored GI: BS positive and NT, ND Extremity:  Pulses positive and No Edema Skin:   Other IV site CDI Neuro: Flat, Abnormal Sensory no withdrawl to pinch in RLE, Abnormal Motor 0/5 in RUE adn RLE, Aphasic and Inattention Musc/Skel:  Other flexor withdrawl LLE Gen NAD   Assessment/Plan: 1.  Functional deficits secondary to Left MCA infarct with R HP, Aphasia which require 3+ hours per day of interdisciplinary therapy in a comprehensive inpatient rehab setting. Physiatrist is providing close team supervision and 24 hour management of active medical problems listed below. Physiatrist and rehab team continue to assess barriers to discharge/monitor patient progress toward functional and medical goals. FIM: Function - Bathing Position: Bed Body parts bathed by patient: Abdomen Body parts bathed by helper: Right arm, Left arm, Chest, Front perineal area, Buttocks, Right upper leg, Left upper leg, Right lower leg, Left lower leg, Back Assist Level: 2 helpers  Function- Upper Body Dressing/Undressing What is the patient wearing?: Bra, Pull over shirt/dress Bra - Perfomed by helper: Thread/unthread right bra strap, Thread/unthread left bra strap, Hook/unhook bra (pull down sports bra) Pull over shirt/dress - Perfomed by helper: Thread/unthread right sleeve, Thread/unthread left sleeve, Put head through opening, Pull shirt over trunk Assist Level: (total A) Function - Lower Body Dressing/Undressing What is the patient wearing?: Non-skid slipper socks, Pants Position: Bed Pants- Performed by helper: Thread/unthread right pants leg, Thread/unthread left pants leg, Pull pants up/down Non-skid slipper socks- Performed by helper: Don/doff right sock, Don/doff left sock Socks - Performed by helper: Don/doff right sock, Don/doff left sock Shoes - Performed by helper: Don/doff right shoe, Don/doff left shoe(slip on) Assist for lower body dressing: 2 Helpers  Function - Toileting Toileting activity did not occur: No continent bowel/bladder event Toileting steps completed by helper: Adjust clothing prior to toileting, Performs perineal hygiene, Adjust clothing after toileting Assist level: Touching or steadying assistance (Pt.75%)  Function - Air cabin crew transfer activity did not  occur: Safety/medical concerns  Function - Chair/bed transfer Chair/bed transfer activity did not occur: Safety/medical concerns  Function - Locomotion: Oceanographer activity did not occur: Safety/medical concerns Wheel 50 feet with 2 turns activity did not occur: Safety/medical concerns Wheel 150 feet activity did not occur: Safety/medical concerns Function - Locomotion: Ambulation Ambulation activity did not occur: Safety/medical concerns Walk 10 feet activity did not occur: Safety/medical concerns Walk 50 feet with 2 turns activity did not occur: Safety/medical concerns Walk 150 feet activity did not occur: Safety/medical concerns Walk 10 feet on uneven surfaces activity did not occur: Safety/medical concerns  Function - Comprehension Comprehension: Auditory, Visual Comprehension assist level: Understands basic 25 - 49% of the time/ requires cueing 50 - 75% of the time, Understands basic less than 25% of the time/ requires cueing >75% of the time  Function - Expression Expression: Nonverbal Expression assist level: Expresses basis less than 25% of the time/requires cueing >75% of the time.  Function - Social Interaction Social Interaction assist level: Interacts appropriately 25 - 49% of time - Needs frequent redirection.  Function - Problem Solving Problem solving assist level: Solves basic less than 25% of the time - needs direction nearly all the time or does not effectively solve problems and may need a restraint for safety  Function - Memory Memory assist level: Recognizes or recalls less than 25% of the time/requires cueing greater than 75% of the time Patient normally able to recall (first 3 days only): None of the above  Medical Problem List and Plan: 1. Functional  deficits secondary to  due to L-MCA stroke with extension - Aphasia with Right spastic hemiplegia Cont CIR PT< OT, SLP, mod A LTG 2.  DVT Prophylaxis/Anticoagulation: Pharmaceutical: Lovenox 3.  Pain Management: tylenol prn 4. Mood: LCSW to follow for evaluation when appropriate.  5. Neuropsych: This patient is not capable of making decisions on her own behalf. 6. Skin/Wound Care: Routine pressure relief measures. Maintain adequate nutritional and hydration status.  7. Fluids/Electrolytes/Nutrition: Monitor I/O. Intake of fluids fair, meal intake good                        8. T2DM: Hgb A1C- Monitor BS ac/hs and use SSI for elevated BS. Continue to titrate medications for tighter control.  CBG (last 3)  Recent Labs    06/22/17 1708 06/22/17 2039 06/23/17 0632  GLUCAP 285* 273* 172*  on Lantus increase to 30U, on metformin at home, 8U novalog with meals 9. HTN: Poorly controlled due to compliance issues.  Vitals:   06/22/17 1522 06/23/17 0207  BP: (!) 202/78 (!) 168/80  Pulse: 78 80  Resp:  16  Temp:  99.1 F (37.3 C)  SpO2:  94%  ~7d post CVA, started amlodipine 2/6, monitor may increase to 22m if needed  10. Fever with leucocytosis: off Unasynper ID. CXR neg , repeat urine neg, no DVT,recent ECHO no evidence of endocarditis no other symptoms, ask for ID consult FUO Tmax 99.1Ax 11. Dysphagia: Continue dysphagia 1, nectar liquids with assistance at meals to help maintain adequate hydration.  12. Recent episode of  Depression: Now onCelexa last month.   13. Obesity             Body mass index is 32.91 kg/m.             Diet and exercise education             Will cont to encourage weight loss to increase endurance and promote overall health 14.  Hypokalemia on supplementation   LOS (Days) 3 A [place] 06/23/2017, 8:09 AM

## 2017-06-23 NOTE — Progress Notes (Signed)
Physical Therapy Session Note  Patient Details  Name: Sandra [name] MRN: [id number] Date of Birth: 01/16/1941  Today's Date: 06/23/2017 PT Individual Time: 1530-1600 PT Individual Time Calculation (min): 30 min   Short Term Goals: Week 1:  PT Short Term Goal 1 (Week 1): Pt will demonstrate bed mobility with max assist +1 using bed features PT Short Term Goal 2 (Week 1): Pt will transfer without use of lift equipment in 25% of opportunities PT Short Term Goal 3 (Week 1): Pt will scan to R environment in 50% of observations with min cues. PT Short Term Goal 4 (Week 1): Pt will tolerate out of bed to w/c for 2 hours outside of therapy.   Skilled Therapeutic Interventions/Progress Updates:    no c/o pain.  Session focus on positioning in w/c, attention, visual scanning, and postural control from w/c level.    Dynavision on continuous mode A with hand over hand assist for RUE and max/total facilitation to reduce LOB to R with cross body reaching.    PT adjusted pt's w/c leg rests for improved positioning and sitting tolerance.  Returned to room at end of session and positioned upright with call bell in reach and needs met.   Therapy Documentation Precautions:  Precautions Precautions: Fall Precaution Comments: dense R hemiparesis, R inattention Restrictions Weight Bearing Restrictions: No Other Position/Activity Restrictions: Positioning of right upper extremity in sight of patient and not pulling on it as it is flaccid.    See Function Navigator for Current Functional Status.   Therapy/Group: Individual Therapy  Caitlin E Warren 06/23/2017, 4:39 PM  

## 2017-06-23 NOTE — Progress Notes (Signed)
Subjective: Unable to obtain ROS due to aphasia.  Antibiotics:  Anti-infectives (From admission, onward)   Start     Dose/Rate Route Frequency Ordered Stop   06/20/17 1700  Ampicillin-Sulbactam (UNASYN) 3 g in sodium chloride 0.9 % 100 mL IVPB  Status:  Discontinued     3 g 200 mL/hr over 30 Minutes Intravenous Every 6 hours 06/20/17 1643 06/22/17 1507      Medications: Scheduled Meds: . amLODipine  5 mg Oral Daily  . aspirin EC  325 mg Oral Daily  . atorvastatin  40 mg Oral q1800  . citalopram  10 mg Oral Daily  . clopidogrel  75 mg Oral Q breakfast  . enoxaparin (LOVENOX) injection  40 mg Subcutaneous Q24H  . insulin aspart  0-15 Units Subcutaneous TID WC  . insulin aspart  0-5 Units Subcutaneous QHS  . insulin aspart  8 Units Subcutaneous TID WC  . [START ON 06/24/2017] insulin glargine  30 Units Subcutaneous Daily  . mouth rinse  15 mL Mouth Rinse BID  . potassium chloride  20 mEq Oral Q12H  . senna-docusate  2 tablet Oral QHS  . vitamin B-12  1,000 mcg Oral Daily   Continuous Infusions: PRN Meds:.acetaminophen, alum & mag hydroxide-simeth, bisacodyl, cloNIDine, diphenhydrAMINE, guaiFENesin-dextromethorphan, prochlorperazine **OR** prochlorperazine **OR** prochlorperazine, RESOURCE THICKENUP CLEAR, sodium phosphate, traZODone  Objective: Weight change:   Intake/Output Summary (Last 24 hours) at 06/23/2017 1002 Last data filed at 06/23/2017 0916 Gross per 24 hour  Intake 720 ml  Output 1250 ml  Net -530 ml   Blood pressure (!) 163/77, pulse 80, temperature 99.1 F (37.3 C), temperature source Axillary, resp. rate 16, height 5\' 5"  (1.651 m), weight 193 lb 14.4 oz (88 kg), SpO2 94 %. Temp:  [98.2 F (36.8 C)-99.1 F (37.3 C)] 99.1 F (37.3 C) (02/07 0207) Pulse Rate:  [78-81] 80 (02/07 0207) Resp:  [16-18] 16 (02/07 0207) BP: (163-211)/(72-80) 163/77 (02/07 0849) SpO2:  [92 %-94 %] 94 % (02/07 0207)  Physical Exam: General: Alert and awake, responds to  name. NAD HEENT: anicteric sclera, left sided gaze preference CVS: RRR, no M/R/G Chest: CTAB on anterior lung fields; unable to reposition for posterior fields, no increased work of breathing. Abdomen: soft nontender, nondistended, normal bowel sounds Extremities: no clubbing or edema noted bilaterally, does indicate pain with palpation of bil calves - no erythema, cords or swelling appreciated Skin: no rashes; unable to reposition to evaluate back Neuro: appears to have more purposeful movements and wish to engage verbally, however still with expressive aphasia limiting communication; flaccid paralysis of R U/LEs  CBC: CBC Latest Ref Rng & Units 06/22/2017 06/21/2017 06/20/2017  WBC 4.0 - 10.5 K/uL 10.8(H) 12.1(H) 14.2(H)  Hemoglobin 12.0 - 15.0 g/dL 13.5 13.4 13.3  Hematocrit 36.0 - 46.0 % 41.2 40.8 41.3  Platelets 150 - 400 K/uL 287 280 257     BMET Recent Labs    06/21/17 0608  NA 145  K 3.2*  CL 101  CO2 29  GLUCOSE 239*  BUN 17  CREATININE 0.72  CALCIUM 8.7*   Liver Panel  Recent Labs    06/21/17 0608  PROT 6.6  ALBUMIN 2.4*  AST 31  ALT 17  ALKPHOS 65  BILITOT 0.8    Sedimentation Rate No results for input(s): ESRSEDRATE in the last 72 hours. C-Reactive Protein No results for input(s): CRP in the last 72 hours.  Micro Results: Recent Results (from the past 720 hour(s))  Culture,  blood (Routine X 2) w Reflex to ID Panel     Status: None (Preliminary result)   Collection Time: 06/19/17  8:14 AM  Result Value Ref Range Status   Specimen Description BLOOD LEFT HAND  Final   Special Requests   Final    BOTTLES DRAWN AEROBIC AND ANAEROBIC Blood Culture adequate volume   Culture   Final    NO GROWTH 4 DAYS Performed at [facility], [address], [place], [place] [address]    Report Status PENDING  Incomplete  Culture, blood (Routine X 2) w Reflex to ID Panel     Status: None (Preliminary result)   Collection Time: 06/19/17  8:15 AM  Result Value Ref Range  Status   Specimen Description RIGHT ANTECUBITAL  Final   Special Requests   Final    BOTTLES DRAWN AEROBIC AND ANAEROBIC Blood Culture adequate volume   Culture   Final    NO GROWTH 4 DAYS Performed at [facility], [address] [place], [place] [address]    Report Status PENDING  Incomplete    Studies/Results: Bil LE venous dopplers 06/21/17: Right: There is no evidence of deep vein thrombosis in the lower extremity. No cystic structure found in the popliteal fossa. Left: There is no evidence of deep vein thrombosis in the lower extremity. No cystic structure found in the popliteal fossa.  Assessment/Plan:  INTERVAL HISTORY:  No further fevers.  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 female with h/o HTN, HLD, T2DM admitted 1/30 with hypertensive emergency found to have mod left MCA stroke with deficits of aphasia, dysphagia, and right sided hemiparesis. She has since been admitted to CIR for rehab. Patient has started having fevers 2/2; workup has included a CXR which was not indicative of pneumonia however poor study, LE dopplers which were negative for DVTs, BCx 2/3 NGTD x4 days, and repeat CT head as patient also had some change in functional status which showed edema w/o shift but no hemorrhagic conversion. UA does not appear infectious, and RUE U/S tech read does not reveal DVTs; hep panel negative. She has not had further fevers in the last 24hrs.  Plan: 1. F/u UE dopplers to eval for DVT official read 2. F/u HIV screen 3. F/u BCx from 2/3 4. No indication for antibiotics at this time 5. If fevers recur and above negative, may consider further FOU workup, however no indication for this at current time.   LOS: 3 days   Alphonzo Grieve  PGY2 Infectious Disease [phone] 06/23/2017,  10:02 AM

## 2017-06-24 ENCOUNTER — Inpatient Hospital Stay (HOSPITAL_COMMUNITY): Payer: Medicare Other | Admitting: Occupational Therapy

## 2017-06-24 ENCOUNTER — Inpatient Hospital Stay (HOSPITAL_COMMUNITY): Payer: Medicare Other | Admitting: Physical Therapy

## 2017-06-24 ENCOUNTER — Inpatient Hospital Stay (HOSPITAL_COMMUNITY): Payer: Medicare Other | Admitting: Speech Pathology

## 2017-06-24 DIAGNOSIS — G8101 Flaccid hemiplegia affecting right dominant side: Secondary | ICD-10-CM

## 2017-06-24 LAB — CULTURE, BLOOD (ROUTINE X 2)
CULTURE: NO GROWTH
CULTURE: NO GROWTH
SPECIAL REQUESTS: ADEQUATE
SPECIAL REQUESTS: ADEQUATE

## 2017-06-24 LAB — GLUCOSE, CAPILLARY
GLUCOSE-CAPILLARY: 273 mg/dL — AB (ref 65–99)
GLUCOSE-CAPILLARY: 355 mg/dL — AB (ref 65–99)
GLUCOSE-CAPILLARY: 138 mg/dL — AB (ref 65–99)
GLUCOSE-CAPILLARY: 96 mg/dL (ref 65–99)

## 2017-06-24 MED ORDER — INSULIN GLARGINE 100 UNIT/ML ~~LOC~~ SOLN
35.0000 [IU] | Freq: Every day | SUBCUTANEOUS | Status: DC
  Administered 2017-06-24 – 2017-06-30 (×7): 35 [IU] via SUBCUTANEOUS
  Filled 2017-06-24 (×7): qty 0.35

## 2017-06-24 MED ORDER — AMLODIPINE BESYLATE 10 MG PO TABS
10.0000 mg | ORAL_TABLET | Freq: Every day | ORAL | Status: DC
  Administered 2017-06-24 – 2017-07-14 (×21): 10 mg via ORAL
  Filled 2017-06-24 (×20): qty 1

## 2017-06-24 NOTE — Progress Notes (Signed)
Subjective/Complaints:  Appreciate ID note, eats breakfast with Sup, no coughing per CNA  ROS- cannot obtain due to aphasia  Objective: Vital Signs: Blood pressure (!) 160/71, pulse (!) 106, temperature 99.3 F (37.4 C), temperature source Oral, resp. rate (!) 21, height 5\' 5"  (1.651 m), weight 88 kg (193 lb 14.4 oz), SpO2 91 %.  Results for orders placed or performed during the hospital encounter of 06/20/17 (from the past 72 hour(s))  Glucose, capillary     Status: Abnormal   Collection Time: 06/21/17 11:57 AM  Result Value Ref Range   Glucose-Capillary 281 (H) 65 - 99 mg/dL  Glucose, capillary     Status: Abnormal   Collection Time: 06/21/17  4:40 PM  Result Value Ref Range   Glucose-Capillary 276 (H) 65 - 99 mg/dL  Glucose, capillary     Status: Abnormal   Collection Time: 06/21/17  9:34 PM  Result Value Ref Range   Glucose-Capillary 231 (H) 65 - 99 mg/dL   Comment 1 Notify RN   Glucose, capillary     Status: Abnormal   Collection Time: 06/22/17  7:15 AM  Result Value Ref Range   Glucose-Capillary 199 (H) 65 - 99 mg/dL  Culture, Urine     Status: None   Collection Time: 06/22/17  9:07 AM  Result Value Ref Range   Specimen Description URINE, CLEAN CATCH    Special Requests Unasyn    Culture      NO GROWTH Performed at [facility], [place] [address], [place], [place] [address]    Report Status 06/23/2017 FINAL   CBC with Differential/Platelet     Status: Abnormal   Collection Time: 06/22/17  9:58 AM  Result Value Ref Range   WBC 10.8 (H) 4.0 - 10.5 K/uL   RBC 4.64 3.87 - 5.11 MIL/uL   Hemoglobin 13.5 12.0 - 15.0 g/dL   HCT 41.2 36.0 - 46.0 %   MCV 88.8 78.0 - 100.0 fL   MCH 29.1 26.0 - 34.0 pg   MCHC 32.8 30.0 - 36.0 g/dL   RDW 15.2 11.5 - 15.5 %   Platelets 287 150 - 400 K/uL   Neutrophils Relative % 70 %   Neutro Abs 7.5 1.7 - 7.7 K/uL   Lymphocytes Relative 20 %   Lymphs Abs 2.2 0.7 - 4.0 K/uL   Monocytes Relative 10 %   Monocytes Absolute 1.1 (H)  0.1 - 1.0 K/uL   Eosinophils Relative 0 %   Eosinophils Absolute 0.0 0.0 - 0.7 K/uL   Basophils Relative 0 %   Basophils Absolute 0.0 0.0 - 0.1 K/uL    Comment: Performed at [facility] [address] [place], [place] [address]  Glucose, capillary     Status: Abnormal   Collection Time: 06/22/17 11:45 AM  Result Value Ref Range   Glucose-Capillary 219 (H) 65 - 99 mg/dL  Glucose, capillary     Status: Abnormal   Collection Time: 06/22/17  5:08 PM  Result Value Ref Range   Glucose-Capillary 285 (H) 65 - 99 mg/dL  Urinalysis, Routine w reflex microscopic     Status: Abnormal   Collection Time: 06/22/17  5:24 PM  Result Value Ref Range   Color, Urine YELLOW YELLOW   APPearance CLEAR CLEAR   Specific Gravity, Urine 1.020 1.005 - 1.030   pH 6.0 5.0 - 8.0   Glucose, UA >=500 (A) NEGATIVE mg/dL   Hgb urine dipstick NEGATIVE NEGATIVE   Bilirubin Urine NEGATIVE NEGATIVE  Ketones, ur NEGATIVE NEGATIVE mg/dL   Protein, ur 100 (A) NEGATIVE mg/dL   Nitrite NEGATIVE NEGATIVE   Leukocytes, UA NEGATIVE NEGATIVE   RBC / HPF 0-5 0 - 5 RBC/hpf   WBC, UA 0-5 0 - 5 WBC/hpf   Bacteria, UA RARE (A) NONE SEEN   Squamous Epithelial / LPF 0-5 (A) NONE SEEN   Mucus PRESENT     Comment: Performed at [facility], [place] [address], [place], [place] [address]  HIV antibody     Status: None   Collection Time: 06/22/17  6:33 PM  Result Value Ref Range   HIV Screen 4th Generation wRfx Non Reactive Non Reactive    Comment: (NOTE) Performed At: [facility] [place], [place] [id number] Rush Farmer MD MW:[id number] Performed at [facility], [place] [address] [place], [place] [address]   Hepatitis panel, acute     Status: None   Collection Time: 06/22/17  6:33 PM  Result Value Ref Range   Hepatitis B Surface Ag Negative Negative   HCV Ab <0.1 0.0 - 0.9 s/co ratio    Comment: (NOTE)                                  Negative:     < 0.8                              Indeterminate: 0.8 - 0.9                                  Positive:     > 0.9 The CDC recommends that a positive HCV antibody result be followed up with a HCV Nucleic Acid Amplification test (425956). Performed At: [facility] [place], [place] [id number] Rush Farmer MD RJ:[id number]    Hep A IgM Negative Negative   Hep B C IgM Negative Negative    Comment: Performed at [facility], [place] [address], [place], [place] [address]  Glucose, capillary     Status: Abnormal   Collection Time: 06/22/17  8:39 PM  Result Value Ref Range   Glucose-Capillary 273 (H) 65 - 99 mg/dL  Glucose, capillary     Status: Abnormal   Collection Time: 06/23/17  6:32 AM  Result Value Ref Range   Glucose-Capillary 172 (H) 65 - 99 mg/dL  Glucose, capillary     Status: Abnormal   Collection Time: 06/23/17 11:54 AM  Result Value Ref Range   Glucose-Capillary 270 (H) 65 - 99 mg/dL  Glucose, capillary     Status: Abnormal   Collection Time: 06/23/17  4:32 PM  Result Value Ref Range   Glucose-Capillary 224 (H) 65 - 99 mg/dL  Glucose, capillary     Status: Abnormal   Collection Time: 06/23/17  9:06 PM  Result Value Ref Range   Glucose-Capillary 197 (H) 65 - 99 mg/dL  Glucose, capillary     Status: Abnormal   Collection Time: 06/24/17  6:48 AM  Result Value Ref Range   Glucose-Capillary 273 (H) 65 - 99 mg/dL     HEENT: normal Cardio: RRR and no murmur Resp: CTA B/L and unlabored GI: BS positive and NT, ND Extremity:  Pulses positive and No Edema Skin:   Other IV site CDI Neuro: Flat,  Abnormal Sensory no withdrawl to pinch in RLE, Abnormal Motor 0/5 in RUE adn RLE, Aphasic and Inattention Musc/Skel:  Other flexor withdrawl LLE Gen NAD   Assessment/Plan: 1. Functional deficits secondary to Left MCA infarct with R HP, Aphasia which require 3+ hours per day of interdisciplinary therapy in a comprehensive inpatient rehab setting. Physiatrist is providing close team  supervision and 24 hour management of active medical problems listed below. Physiatrist and rehab team continue to assess barriers to discharge/monitor patient progress toward functional and medical goals. FIM: Function - Bathing Position: Bed Body parts bathed by patient: Abdomen Body parts bathed by helper: Right arm, Left arm, Chest, Front perineal area, Buttocks, Right upper leg, Left upper leg, Right lower leg, Left lower leg, Back Assist Level: 2 helpers  Function- Upper Body Dressing/Undressing What is the patient wearing?: Bra, Pull over shirt/dress Bra - Perfomed by helper: Thread/unthread right bra strap, Thread/unthread left bra strap, Hook/unhook bra (pull down sports bra) Pull over shirt/dress - Perfomed by helper: Thread/unthread right sleeve, Thread/unthread left sleeve, Put head through opening, Pull shirt over trunk Assist Level: (total A) Function - Lower Body Dressing/Undressing What is the patient wearing?: Non-skid slipper socks, Pants Position: Bed Pants- Performed by helper: Thread/unthread right pants leg, Thread/unthread left pants leg, Pull pants up/down Non-skid slipper socks- Performed by helper: Don/doff right sock, Don/doff left sock Socks - Performed by helper: Don/doff right sock, Don/doff left sock Shoes - Performed by helper: Don/doff right shoe, Don/doff left shoe(slip on) Assist for lower body dressing: 2 Helpers  Function - Toileting Toileting activity did not occur: No continent bowel/bladder event Toileting steps completed by helper: Adjust clothing prior to toileting, Performs perineal hygiene, Adjust clothing after toileting  Function - Air cabin crew transfer activity did not occur: Safety/medical concerns  Function - Chair/bed transfer Chair/bed transfer activity did not occur: Safety/medical concerns Chair/bed transfer assist level: 2 helpers Chair/bed transfer assistive device: Mechanical lift Mechanical lift: Stedy Chair/bed  transfer details: Manual facilitation for placement, Manual facilitation for weight bearing, Manual facilitation for weight shifting  Function - Locomotion: Wheelchair Wheelchair activity did not occur: Safety/medical concerns Assist Level: Dependent (Pt equals 0%)(per PT notes, transported to gym) Wheel 50 feet with 2 turns activity did not occur: Safety/medical concerns Wheel 150 feet activity did not occur: Safety/medical concerns Function - Locomotion: Ambulation Ambulation activity did not occur: Safety/medical concerns Walk 10 feet activity did not occur: Safety/medical concerns Walk 50 feet with 2 turns activity did not occur: Safety/medical concerns Walk 150 feet activity did not occur: Safety/medical concerns Walk 10 feet on uneven surfaces activity did not occur: Safety/medical concerns  Function - Comprehension Comprehension: Auditory Comprehension assist level: Understands basic less than 25% of the time/ requires cueing >75% of the time  Function - Expression Expression: Nonverbal Expression assist level: Expresses basis less than 25% of the time/requires cueing >75% of the time.  Function - Social Interaction Social Interaction assist level: Interacts appropriately less than 25% of the time. May be withdrawn or combative., Interacts appropriately 25 - 49% of time - Needs frequent redirection.  Function - Problem Solving Problem solving assist level: Solves basic less than 25% of the time - needs direction nearly all the time or does not effectively solve problems and may need a restraint for safety  Function - Memory Memory assist level: Recognizes or recalls less than 25% of the time/requires cueing greater than 75% of the time Patient normally able to recall (first 3 days only): None of the  above  Medical Problem List and Plan: 1. Functional deficits secondary to  due to L-MCA stroke with extension - Aphasia with Right spastic hemiplegia Cont CIR PT< OT, SLP, mod A  LTG 2.  DVT Prophylaxis/Anticoagulation: Pharmaceutical: Lovenox 3. Pain Management: tylenol prn 4. Mood: LCSW to follow for evaluation when appropriate.  5. Neuropsych: This patient is not capable of making decisions on her own behalf. 6. Skin/Wound Care: Routine pressure relief measures. Maintain adequate nutritional and hydration status.  7. Fluids/Electrolytes/Nutrition: Monitor I/O. Intake of fluids fair, meal intake good, D/C iv                        8. T2DM: Hgb A1C- Monitor BS ac/hs and use SSI for elevated BS. Continue to titrate medications for tighter control.  CBG (last 3)  Recent Labs    06/23/17 1632 06/23/17 2106 06/24/17 0648  GLUCAP 224* 197* 273*  on Lantus increase to 35U, on metformin at home, 8U novalog with meals 9. HTN: Poorly controlled due to compliance issues.  Vitals:   06/23/17 1409 06/24/17 0232  BP: (!) 159/66 (!) 160/71  Pulse: 78 (!) 106  Resp: 20 (!) 21  Temp: 98.5 F (36.9 C) 99.3 F (37.4 C)  SpO2: 98% 91%  ~7d post CVA, started amlodipine 2/6,increase to 10mg  if needed  10. Fever with leucocytosis: off Unasyn per ID. CXR neg , repeat urine neg, no DVT,recent ECHO no evidence of endocarditis no other symptoms, ask for ID consult FUO Hep A,B,C neg, HIV neg Tmax 99.3 po 11. Dysphagia: Continue dysphagia 1, nectar liquids with assistance at meals to help maintain adequate hydration.  12. Recent episode of  Depression: Now onCelexa last month.   13. Obesity             Body mass index is 32.91 kg/m.             Diet and exercise education             Will cont to encourage weight loss to increase endurance and promote overall health 14.  Hypokalemia on supplementation   LOS (Days) 4 A FACE TO FACE EVALUATION WAS PERFORMED  Charlett Blake 06/24/2017, 7:52 AM

## 2017-06-24 NOTE — Progress Notes (Signed)
Physical Therapy Session Note  Patient Details  Name: Sandra [name] MRN: [id number] Date of Birth: [birth year]  Today's Date: 06/24/2017 PT Individual Time: 0900-1000 PT Individual Time Calculation (min): 60 min   Short Term Goals: Week 1:  PT Short Term Goal 1 (Week 1): Pt will demonstrate bed mobility with max assist +1 using bed features PT Short Term Goal 2 (Week 1): Pt will transfer without use of lift equipment in 25% of opportunities PT Short Term Goal 3 (Week 1): Pt will scan to R environment in 50% of observations with min cues. PT Short Term Goal 4 (Week 1): Pt will tolerate out of bed to w/c for 2 hours outside of therapy.   Skilled Therapeutic Interventions/Progress Updates:    no indication of pain based on faces scale.  Session focus on attention and following 1-step commands.  Pt requires supervision>min assist to roll R and max assist to roll L for total assist hygiene and to place hoyer sling.  Hand over hand assist for pericare.  Hoyer to w/c for time management.    Pt requires total +2 for lateral scoot onto therapy mat on L, using draw sheet.  Pt does not appear to assist at all.  On therapy mat attempted to focus on static/dynamic sitting balance with focus on following 1-step commands.  Today pt requiring max/total for static sitting balance with reduced ability to follow 1-step commands, even with blocked practice with reaching task.  Pt requires hand over hand assist to initiate/complete task in 90% of opportunities.  Harrel Lemon used to transfer back to w/c for safety.  Pt returned to room and positioned in tilt in space with QRB in place, call bell in reach and needs met.  Family at bedside.   Therapy Documentation Precautions:  Precautions Precautions: Fall Precaution Comments: dense R hemiparesis, R inattention Restrictions Weight Bearing Restrictions: No Other Position/Activity Restrictions: Positioning of right upper extremity in sight of patient and not pulling on  it as it is flaccid.    See Function Navigator for Current Functional Status.   Therapy/Group: Individual Therapy  Michel Santee 06/24/2017, 10:07 AM

## 2017-06-24 NOTE — Progress Notes (Signed)
Occupational Therapy Session Note  Patient Details  Name: Sandra [name] MRN: [id number] Date of Birth: July 21, 1940  Today's Date: 06/24/2017 OT Individual Time: 1335-1445 OT Individual Time Calculation (min): 70 min    Short Term Goals: Week 1:  OT Short Term Goal 1 (Week 1): Pt will maintain static sitting balance for  5 min EOM/EOB with min A in prep for toileting OT Short Term Goal 2 (Week 1): Pt will locate 2/2 grooming items on R side of sink with MOD VC OT Short Term Goal 3 (Week 1): Pt will manage RUE with LUE prior to transfer with MOD VC to demonstrate R body attention OT Short Term Goal 4 (Week 1): Pt will transfer to w/c with MAX A of 1 in prep for [facility] transfer  Skilled Therapeutic Interventions/Progress Updates:    OT treatment session focused on bed mobility, sit<>stand, trunk control, and R attention. Pt incontinent of BM upon OT arrival requiring min A to roll R and total A to roll L for total A peri-care and brief change. Dependent + 2 for LB dressing. Worked on hip bridging to pull up pants but pt unable to clear hips from bed, but she was able to follow commands to "lift bottom". Maximove transfer bed to TIS wc. Worked on sequencing and initiation for UB dressing task. Max multimodal cues to thread L UE into shirt, then pt initiated pulling shirt overhead. Pt completed 3 sit<>stands using Clarise Cruz with 2 helpers. Worked on reaching and trunk extension with reaching activity to R. Pt needed multimodal cues to follow one step commands in distracting environment. Pt left tilted in wc at end of session with needs met.   Therapy Documentation Precautions:  Precautions Precautions: Fall Precaution Comments: dense R hemiparesis, R inattention Restrictions Weight Bearing Restrictions: No Other Position/Activity Restrictions: Positioning of right upper extremity in sight of patient and not pulling on it as it is flaccid.   See Function Navigator for Current Functional  Status.   Therapy/Group: Individual Therapy  Valma Cava 06/24/2017, 2:50 PM

## 2017-06-24 NOTE — Progress Notes (Signed)
Subjective: Unable to obtain ROS due to aphasia.  Antibiotics:  Anti-infectives (From admission, onward)   Start     Dose/Rate Route Frequency Ordered Stop   06/20/17 1700  Ampicillin-Sulbactam (UNASYN) 3 g in sodium chloride 0.9 % 100 mL IVPB  Status:  Discontinued     3 g 200 mL/hr over 30 Minutes Intravenous Every 6 hours 06/20/17 1643 06/22/17 1507      Medications: Scheduled Meds: . amLODipine  10 mg Oral Daily  . aspirin EC  325 mg Oral Daily  . atorvastatin  40 mg Oral q1800  . citalopram  10 mg Oral Daily  . clopidogrel  75 mg Oral Q breakfast  . enoxaparin (LOVENOX) injection  40 mg Subcutaneous Q24H  . insulin aspart  0-15 Units Subcutaneous TID WC  . insulin aspart  0-5 Units Subcutaneous QHS  . insulin aspart  8 Units Subcutaneous TID WC  . insulin glargine  35 Units Subcutaneous Daily  . mouth rinse  15 mL Mouth Rinse BID  . senna-docusate  2 tablet Oral QHS  . vitamin B-12  1,000 mcg Oral Daily   Continuous Infusions: PRN Meds:.acetaminophen, alum & mag hydroxide-simeth, bisacodyl, cloNIDine, diphenhydrAMINE, guaiFENesin-dextromethorphan, prochlorperazine **OR** prochlorperazine **OR** prochlorperazine, RESOURCE THICKENUP CLEAR, sodium phosphate, traZODone  Objective: Weight change:   Intake/Output Summary (Last 24 hours) at 06/24/2017 1053 Last data filed at 06/24/2017 0809 Gross per 24 hour  Intake 600 ml  Output 200 ml  Net 400 ml   Blood pressure (!) 163/70, pulse (!) 106, temperature 99.3 F (37.4 C), temperature source Oral, resp. rate (!) 21, height 5\' 5"  (1.651 m), weight 193 lb 14.4 oz (88 kg), SpO2 91 %. Temp:  [98.5 F (36.9 C)-99.3 F (37.4 C)] 99.3 F (37.4 C) (02/08 0232) Pulse Rate:  [78-106] 106 (02/08 0232) Resp:  [20-21] 21 (02/08 0232) BP: (159-163)/(66-71) 163/70 (02/08 0820) SpO2:  [91 %-98 %] 91 % (02/08 0232)  Physical Exam: General: Alert and awake, responds to name. NAD HEENT: anicteric sclera, left sided gaze  preference CVS: RRR, no M/R/G Chest: CTAB on anterior lung fields; unable to reposition for posterior fields, no increased work of breathing. Abdomen: soft nontender, nondistended, normal bowel sounds Extremities: no edema noted bilaterally Skin: no rashes; unable to reposition to evaluate back Neuro: expressive aphasia limiting communication; flaccid paralysis of R U/LEs  CBC: CBC Latest Ref Rng & Units 06/22/2017 06/21/2017 06/20/2017  WBC 4.0 - 10.5 K/uL 10.8(H) 12.1(H) 14.2(H)  Hemoglobin 12.0 - 15.0 g/dL 13.5 13.4 13.3  Hematocrit 36.0 - 46.0 % 41.2 40.8 41.3  Platelets 150 - 400 K/uL 287 280 257     BMET No results for input(s): NA, K, CL, CO2, GLUCOSE, BUN, CREATININE, CALCIUM in the last 72 hours. Liver Panel  No results for input(s): PROT, ALBUMIN, AST, ALT, ALKPHOS, BILITOT, BILIDIR, IBILI in the last 72 hours.  Sedimentation Rate No results for input(s): ESRSEDRATE in the last 72 hours. C-Reactive Protein No results for input(s): CRP in the last 72 hours.  Micro Results: Recent Results (from the past 720 hour(s))  Culture, blood (Routine X 2) w Reflex to ID Panel     Status: None   Collection Time: 06/19/17  8:14 AM  Result Value Ref Range Status   Specimen Description BLOOD LEFT HAND  Final   Special Requests   Final    BOTTLES DRAWN AEROBIC AND ANAEROBIC Blood Culture adequate volume   Culture   Final    NO  GROWTH 5 DAYS Performed at [facility], [address], [place], [place] [address]    Report Status 06/24/2017 FINAL  Final  Culture, blood (Routine X 2) w Reflex to ID Panel     Status: None   Collection Time: 06/19/17  8:15 AM  Result Value Ref Range Status   Specimen Description RIGHT ANTECUBITAL  Final   Special Requests   Final    BOTTLES DRAWN AEROBIC AND ANAEROBIC Blood Culture adequate volume   Culture   Final    NO GROWTH 5 DAYS Performed at [facility], [address], [place], [place] [address]    Report Status 06/24/2017 FINAL  Final  Culture,  Urine     Status: None   Collection Time: 06/22/17  9:07 AM  Result Value Ref Range Status   Specimen Description URINE, CLEAN CATCH  Final   Special Requests Unasyn  Final   Culture   Final    NO GROWTH Performed at [facility], [place] [address] [place], [place] [address]    Report Status 06/23/2017 FINAL  Final    Studies/Results: Bil LE venous dopplers 06/21/17: Right: There is no evidence of deep vein thrombosis in the lower extremity. No cystic structure found in the popliteal fossa. Left: There is no evidence of deep vein thrombosis in the lower extremity. No cystic structure found in the popliteal fossa.  Assessment/Plan:  INTERVAL HISTORY:  No further fevers.  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 female with h/o HTN, HLD, T2DM admitted 1/30 with hypertensive emergency found to have mod left MCA stroke with deficits of aphasia, dysphagia, and right sided hemiparesis. She has since been admitted to CIR for rehab. Patient has started having fevers 2/2; workup has included a CXR which was not indicative of pneumonia however poor study, LE dopplers which were negative for DVTs, BCx 2/3 NGTD x5 days, and repeat CT head as patient also had some change in functional status which showed edema w/o shift but no hemorrhagic conversion. UA does not appear infectious, urine Cx w/o growth, and RUE U/S does not reveal DVTs; hep panel negative, HIV screen negative.  She has not had further fevers in the last 48hrs.  Plan: 1. Will s/o at this time; please call if further questions arise.   LOS: 4 days   Alphonzo Grieve  PGY2 Infectious Disease [phone] 06/24/2017, 10:53 AM

## 2017-06-24 NOTE — Progress Notes (Signed)
Physical Therapy Session Note  Patient Details  Name: Sandra [name] MRN: [id number] Date of Birth: May 26, 1940  Today's Date: 06/24/2017 PT Individual Time: 1455-1525 PT Individual Time Calculation (min): 30 min   Short Term Goals: Week 1:  PT Short Term Goal 1 (Week 1): Pt will demonstrate bed mobility with max assist +1 using bed features PT Short Term Goal 2 (Week 1): Pt will transfer without use of lift equipment in 25% of opportunities PT Short Term Goal 3 (Week 1): Pt will scan to R environment in 50% of observations with min cues. PT Short Term Goal 4 (Week 1): Pt will tolerate out of bed to w/c for 2 hours outside of therapy.   Skilled Therapeutic Interventions/Progress Updates:    no c/o pain.  Session focus on attention and following 1-step commands for simple task.    [name], music used for stimulation.  Pt requires total/hand over hand assist to place pegs in graded peg board.  Rest breaks provided to determine whether pt would improve, but unsuccessful.  Suspect inability to attend and follow commands due to mental fatigue.  Returned to room in w/c with QRB in place, call bell in reach and needs met.   Therapy Documentation Precautions:  Precautions Precautions: Fall Precaution Comments: dense R hemiparesis, R inattention Restrictions Weight Bearing Restrictions: No Other Position/Activity Restrictions: Positioning of right upper extremity in sight of patient and not pulling on it as it is flaccid.    See Function Navigator for Current Functional Status.   Therapy/Group: Individual Therapy  Michel Santee 06/24/2017, 3:24 PM

## 2017-06-24 NOTE — Progress Notes (Signed)
Speech Language Pathology Daily Session Note  Patient Details  Name: Sandra [name] MRN: [id number] Date of Birth: 03-Dec-1940  Today's Date: 06/24/2017 SLP Individual Time: 1030-1100 SLP Individual Time Calculation (min): 30 min  Short Term Goals: Week 1: SLP Short Term Goal 1 (Week 1): Pt will answer yes/no immediate, personally relevant questions in 50% of opportunities with max assist multimodal cues.   SLP Short Term Goal 2 (Week 1): Pt will follow 1 step commands for 50% accuracy during basic, familiar tasks with max assist multimodal cues.   SLP Short Term Goal 3 (Week 1): Pt will verbalize at the vocalic syllable level in 25% of opportunities with max assist multimodal cues.   SLP Short Term Goal 4 (Week 1): Pt will sustain her attention to basic, familiar tasks for 1 minute intervals with max verbal cues for redirection.   SLP Short Term Goal 5 (Week 1): Pt will consume dys 1 textures and nectar thick liquids with mod verbal cues for use of swallowing precautions and minimal overt s/s of aspiration.   SLP Short Term Goal 6 (Week 1): Pt will consume therapeutic trials of thin liquids with mod verbal cues for use of swallowing precautions and minimal overt s/s of aspiration over 3 targeted sessions prior to repeat objective assessment.   Skilled Therapeutic Interventions: Skilled treatment session focused on communication and dysphagia goals. SLP facilitated session by providing skilled observation of pt consuming thin liquids via cup. Pt able to hold cup and consumed without any overt s/s of aspiration (except for minimal anterior spillage on right). SLP further facilitated session by providing Total faded to Max A cues to select requested object in field of 2. Pt able to select 3 of 10 objects. Pt required Total A for sustain attention for ~ 1 minute intervals. Daughter-in-law present and this SLP wrote down deficits in the ares of global aphasia, cognition and dysphagia for better education  to her family. Also reviewed required amount of care that we are projecting that pt will need at time of discharge. Pt was returned to room, left upright in wheelchair, safety belt donned and daughter-in-law present.      Function:  Eating Eating   Modified Consistency Diet: No(Thin water wtih SLP) Eating Assist Level: Help with picking up utensils;Help managing cup/glass;Supervision or verbal cues;Set up assist for   Eating Set Up Assist For: Opening containers       Cognition Comprehension Comprehension assist level: Understands basic less than 25% of the time/ requires cueing >75% of the time  Expression   Expression assist level: Expresses basis less than 25% of the time/requires cueing >75% of the time.  Social Interaction Social Interaction assist level: Interacts appropriately less than 25% of the time. May be withdrawn or combative.  Problem Solving Problem solving assist level: Solves basic less than 25% of the time - needs direction nearly all the time or does not effectively solve problems and may need a restraint for safety  Memory Memory assist level: Recognizes or recalls less than 25% of the time/requires cueing greater than 75% of the time    Pain Pain Assessment Pain Assessment: 0-10 Pain Score: 0-No pain  Therapy/Group: Individual Therapy  Lennell Shanks 06/24/2017, 12:33 PM

## 2017-06-25 ENCOUNTER — Inpatient Hospital Stay (HOSPITAL_COMMUNITY): Payer: Medicare Other | Admitting: Occupational Therapy

## 2017-06-25 ENCOUNTER — Inpatient Hospital Stay (HOSPITAL_COMMUNITY): Payer: Medicare Other | Admitting: Physical Therapy

## 2017-06-25 ENCOUNTER — Inpatient Hospital Stay (HOSPITAL_COMMUNITY): Payer: Medicare Other | Admitting: Speech Pathology

## 2017-06-25 DIAGNOSIS — D72829 Elevated white blood cell count, unspecified: Secondary | ICD-10-CM

## 2017-06-25 DIAGNOSIS — I69353 Hemiplegia and hemiparesis following cerebral infarction affecting right non-dominant side: Principal | ICD-10-CM

## 2017-06-25 DIAGNOSIS — E1169 Type 2 diabetes mellitus with other specified complication: Secondary | ICD-10-CM

## 2017-06-25 DIAGNOSIS — E876 Hypokalemia: Secondary | ICD-10-CM

## 2017-06-25 DIAGNOSIS — R202 Paresthesia of skin: Secondary | ICD-10-CM

## 2017-06-25 DIAGNOSIS — E669 Obesity, unspecified: Secondary | ICD-10-CM

## 2017-06-25 LAB — GLUCOSE, CAPILLARY
GLUCOSE-CAPILLARY: 161 mg/dL — AB (ref 65–99)
GLUCOSE-CAPILLARY: 307 mg/dL — AB (ref 65–99)
GLUCOSE-CAPILLARY: 245 mg/dL — AB (ref 65–99)
Glucose-Capillary: 95 mg/dL (ref 65–99)

## 2017-06-25 NOTE — Progress Notes (Signed)
Subjective/Complaints: Patient seen lying in bed this morning. No reported issues overnight.  ROS- unable to perform due to aphasia  Objective: Vital Signs: Blood pressure 128/73, pulse 75, temperature 98.5 F (36.9 C), resp. rate 20, height 5\' 5"  (1.651 m), weight 88 kg (193 lb 14.4 oz), SpO2 92 %.  Results for orders placed or performed during the hospital encounter of 06/20/17 (from the past 72 hour(s))  Glucose, capillary     Status: Abnormal   Collection Time: 06/22/17 11:45 AM  Result Value Ref Range   Glucose-Capillary 219 (H) 65 - 99 mg/dL  Glucose, capillary     Status: Abnormal   Collection Time: 06/22/17  5:08 PM  Result Value Ref Range   Glucose-Capillary 285 (H) 65 - 99 mg/dL  Urinalysis, Routine w reflex microscopic     Status: Abnormal   Collection Time: 06/22/17  5:24 PM  Result Value Ref Range   Color, Urine YELLOW YELLOW   APPearance CLEAR CLEAR   Specific Gravity, Urine 1.020 1.005 - 1.030   pH 6.0 5.0 - 8.0   Glucose, UA >=500 (A) NEGATIVE mg/dL   Hgb urine dipstick NEGATIVE NEGATIVE   Bilirubin Urine NEGATIVE NEGATIVE   Ketones, ur NEGATIVE NEGATIVE mg/dL   Protein, ur 100 (A) NEGATIVE mg/dL   Nitrite NEGATIVE NEGATIVE   Leukocytes, UA NEGATIVE NEGATIVE   RBC / HPF 0-5 0 - 5 RBC/hpf   WBC, UA 0-5 0 - 5 WBC/hpf   Bacteria, UA RARE (A) NONE SEEN   Squamous Epithelial / LPF 0-5 (A) NONE SEEN   Mucus PRESENT     Comment: Performed at [facility], [address], [place], [place] [address]  HIV antibody     Status: None   Collection Time: 06/22/17  6:33 PM  Result Value Ref Range   HIV Screen 4th Generation wRfx Non Reactive Non Reactive    Comment: (NOTE) Performed At: [facility] [place], [place] [id number] Rush Farmer MD AS:[id number] Performed at [facility], [place] [address] [place], [place] [address]   Hepatitis panel, acute     Status: None   Collection Time: 06/22/17  6:33 PM  Result Value Ref Range    Hepatitis B Surface Ag Negative Negative   HCV Ab <0.1 0.0 - 0.9 s/co ratio    Comment: (NOTE)                                  Negative:     < 0.8                             Indeterminate: 0.8 - 0.9                                  Positive:     > 0.9 The CDC recommends that a positive HCV antibody result be followed up with a HCV Nucleic Acid Amplification test (194174). Performed At: [facility] [place], [place] [id number] Rush Farmer MD UD:[id number]    Hep A IgM Negative Negative   Hep B C IgM Negative Negative    Comment: Performed at [facility], [place] [address] [place], [place] [address]  Glucose, capillary     Status: Abnormal   Collection Time: 06/22/17  8:39 PM  Result Value Ref Range   Glucose-Capillary 273 (H) 65 - 99 mg/dL  Glucose, capillary     Status: Abnormal   Collection Time: 06/23/17  6:32 AM  Result Value Ref Range   Glucose-Capillary 172 (H) 65 - 99 mg/dL  Glucose, capillary     Status: Abnormal   Collection Time: 06/23/17 11:54 AM  Result Value Ref Range   Glucose-Capillary 270 (H) 65 - 99 mg/dL  Glucose, capillary     Status: Abnormal   Collection Time: 06/23/17  4:32 PM  Result Value Ref Range   Glucose-Capillary 224 (H) 65 - 99 mg/dL  Glucose, capillary     Status: Abnormal   Collection Time: 06/23/17  9:06 PM  Result Value Ref Range   Glucose-Capillary 197 (H) 65 - 99 mg/dL  Glucose, capillary     Status: Abnormal   Collection Time: 06/24/17  6:48 AM  Result Value Ref Range   Glucose-Capillary 273 (H) 65 - 99 mg/dL  Glucose, capillary     Status: Abnormal   Collection Time: 06/24/17 11:27 AM  Result Value Ref Range   Glucose-Capillary 355 (H) 65 - 99 mg/dL  Glucose, capillary     Status: Abnormal   Collection Time: 06/24/17  4:43 PM  Result Value Ref Range   Glucose-Capillary 138 (H) 65 - 99 mg/dL  Glucose, capillary     Status: None   Collection Time: 06/24/17  9:22 PM  Result Value Ref Range    Glucose-Capillary 96 65 - 99 mg/dL  Glucose, capillary     Status: None   Collection Time: 06/25/17  6:45 AM  Result Value Ref Range   Glucose-Capillary 95 65 - 99 mg/dL     HEENT: Normocephalic. Atraumatic. Cardio: RRR and no JVD.Marland Kitchen Resp: CTA B/L and unlabored GI: BS positive and ND Musculoskeletal:  No tenderness and No Edema Skin:   Warm and dry. Intact. Neuro:  Not following any commands. No spontaneous movement noted. Gen NAD. Vital signs reviewed.  Psych: Blank stare  Assessment/Plan: 1. Functional deficits secondary to Left MCA infarct with R HP, Aphasia which require 3+ hours per day of interdisciplinary therapy in a comprehensive inpatient rehab setting. Physiatrist is providing close team supervision and 24 hour management of active medical problems listed below. Physiatrist and rehab team continue to assess barriers to discharge/monitor patient progress toward functional and medical goals. FIM: Function - Bathing Position: Bed Body parts bathed by patient: Abdomen Body parts bathed by helper: Right arm, Left arm, Chest, Front perineal area, Buttocks, Right upper leg, Left upper leg, Right lower leg, Left lower leg, Back Assist Level: 2 helpers  Function- Upper Body Dressing/Undressing What is the patient wearing?: Bra, Pull over shirt/dress Bra - Perfomed by helper: Thread/unthread right bra strap, Thread/unthread left bra strap, Hook/unhook bra (pull down sports bra) Pull over shirt/dress - Perfomed by helper: Thread/unthread right sleeve, Thread/unthread left sleeve, Put head through opening, Pull shirt over trunk Assist Level: (total A) Function - Lower Body Dressing/Undressing What is the patient wearing?: Non-skid slipper socks, Pants Position: Bed Pants- Performed by helper: Thread/unthread right pants leg, Thread/unthread left pants leg, Pull pants up/down Non-skid slipper socks- Performed by helper: Don/doff right sock, Don/doff left sock Socks - Performed by  helper: Don/doff right sock, Don/doff left sock Shoes - Performed by helper: Don/doff right shoe, Don/doff left shoe(slip on) Assist for lower body dressing: 2 Helpers  Function - Toileting Toileting activity did not occur: No continent bowel/bladder event Toileting steps completed by helper: Adjust  clothing prior to toileting, Performs perineal hygiene, Adjust clothing after toileting  Function - Toilet Transfers Toilet transfer activity did not occur: Safety/medical concerns  Function - Chair/bed transfer Chair/bed transfer activity did not occur: Safety/medical concerns Chair/bed transfer assist level: 2 helpers Chair/bed transfer assistive device: Mechanical lift Mechanical lift: Stedy Chair/bed transfer details: Manual facilitation for placement, Manual facilitation for weight bearing, Manual facilitation for weight shifting  Function - Locomotion: Wheelchair Wheelchair activity did not occur: Safety/medical concerns Assist Level: Dependent (Pt equals 0%)(per PT notes, transported to gym) Wheel 50 feet with 2 turns activity did not occur: Safety/medical concerns Wheel 150 feet activity did not occur: Safety/medical concerns Function - Locomotion: Ambulation Ambulation activity did not occur: Safety/medical concerns Walk 10 feet activity did not occur: Safety/medical concerns Walk 50 feet with 2 turns activity did not occur: Safety/medical concerns Walk 150 feet activity did not occur: Safety/medical concerns Walk 10 feet on uneven surfaces activity did not occur: Safety/medical concerns  Function - Comprehension Comprehension: Auditory Comprehension assist level: Understands basic less than 25% of the time/ requires cueing >75% of the time  Function - Expression Expression: Nonverbal Expression assist level: Expresses basis less than 25% of the time/requires cueing >75% of the time.  Function - Social Interaction Social Interaction assist level: Interacts appropriately  less than 25% of the time. May be withdrawn or combative.  Function - Problem Solving Problem solving assist level: Solves basic less than 25% of the time - needs direction nearly all the time or does not effectively solve problems and may need a restraint for safety  Function - Memory Memory assist level: Recognizes or recalls less than 25% of the time/requires cueing greater than 75% of the time Patient normally able to recall (first 3 days only): None of the above  Medical Problem List and Plan: 1. Functional deficits secondary to  due to L-MCA stroke with extension   Aphasia with Right spastic hemiplegia   Cont CIR  2.  DVT Prophylaxis/Anticoagulation: Pharmaceutical: Lovenox 3. Pain Management: tylenol prn 4. Mood: LCSW to follow for evaluation when appropriate.  5. Neuropsych: This patient is not capable of making decisions on her own behalf. 6. Skin/Wound Care: Routine pressure relief measures. Maintain adequate nutritional and hydration status.  7. Fluids/Electrolytes/Nutrition: Monitor I/O.  8. T2DM: Hgb A1C- Monitor BS ac/hs and use SSI for elevated BS. Continue to titrate medications for tighter control.  CBG (last 3)  Recent Labs    06/24/17 1643 06/24/17 2122 06/25/17 0645  GLUCAP 138* 96 95  on Lantus increase to 35U, on metformin at home, 8U novalog with meals   Relatively controlled on 2/9 9. HTN: Poorly controlled due to compliance issues.  Vitals:   06/24/17 1449 06/25/17 0538  BP: 138/71 128/73  Pulse: 90 75  Resp: 20 20  Temp: 98.3 F (36.8 C) 98.5 F (36.9 C)  SpO2: 93% 92%    Started amlodipine 2/6,increase to 10mg  if needed    Relatively controlled on 2/9  10. Fever: resolved   off Unasyn per ID. CXR neg , repeat urine neg, no DVT,recent ECHO no evidence of endocarditis no other symptoms, ask for ID consult FUO Hep A,B,C neg, HIV neg   Appreciate ID recs, signed off.    11. Dysphagia: Continue dysphagia 1, nectar liquids with assistance at meals to  help maintain adequate hydration.  12. Recent episode of  Depression: Now onCelexa last month.   13. Obesity  Body mass index is 32.91 kg/m.             Diet and exercise education             Will cont to encourage weight loss to increase endurance and promote overall health 14.  Hypokalemia on supplementation   Potassium 3.2 on 2/5    Labs ordered for Monday 15. Leukocytosis   WBCs 10.8 on 2/5, improving    Labs ordered for Monday 16. Obesity   Body mass index is 32.27 kg/m.   Diet and exercise education   Encourage weight loss to increase endurance and promote overall health   LOS (Days) 5 A FACE TO FACE EVALUATION WAS PERFORMED  Amethyst Gainer Lorie Phenix 06/25/2017, 10:13 AM

## 2017-06-25 NOTE — Progress Notes (Signed)
Occupational Therapy Session Note  Patient Details  Name: Sandra [name] MRN: [id number] Date of Birth: [birth year]  Today's Date: 06/25/2017 OT Individual Time: 8403-3533 OT Individual Time Calculation (min): 73 min    Short Term Goals: Week 1:  OT Short Term Goal 1 (Week 1): Pt will maintain static sitting balance for  5 min EOM/EOB with min A in prep for toileting OT Short Term Goal 2 (Week 1): Pt will locate 2/2 grooming items on R side of sink with MOD VC OT Short Term Goal 3 (Week 1): Pt will manage RUE with LUE prior to transfer with MOD VC to demonstrate R body attention OT Short Term Goal 4 (Week 1): Pt will transfer to w/c with MAX A of 1 in prep for [facility] transfer  Skilled Therapeutic Interventions/Progress Updates:    Treatment session focused on ADLs/self care, bed mob, neuromuscular reeducation, and pt ed. Upon entering pt supine in bed with HOB elevated. Minimal facial expression and inconsistent response with gestures to yes/no. Therapist performed manual joint compression and soft tissue mobs and ROM to affected R UE and LE in all joints, with noted pain with movement in hip. Total A required for all d/b tasks with pt initiating washing abdomin. Pt completed bed mob with max A for positioning and initiating task. Able to demo minimal bridging at this time. Transferred to w/c with mechanical lift and repositioned for max comfort. Pt left resting in TIS with needs met.   Therapy Documentation Precautions:  Precautions Precautions: Fall Precaution Comments: dense R hemiparesis, R inattention Restrictions Weight Bearing Restrictions: No Other Position/Activity Restrictions: Positioning of right upper extremity in sight of patient and not pulling on it as it is flaccid.    Pain: Pain Assessment Pain Assessment: No/denies pain  See Function Navigator for Current Functional Status.   Therapy/Group: Individual Therapy  Delon Sacramento 06/25/2017, 12:21 PM

## 2017-06-25 NOTE — Progress Notes (Signed)
Speech Language Pathology Daily Session Note  Patient Details  Name: Sandra [name] MRN: [id number] Date of Birth: [birth year]  Today's Date: 06/25/2017 SLP Individual Time: 1300-1345 SLP Individual Time Calculation (min): 45 min  Short Term Goals: Week 1: SLP Short Term Goal 1 (Week 1): Pt will answer yes/no immediate, personally relevant questions in 50% of opportunities with max assist multimodal cues.   SLP Short Term Goal 2 (Week 1): Pt will follow 1 step commands for 50% accuracy during basic, familiar tasks with max assist multimodal cues.   SLP Short Term Goal 3 (Week 1): Pt will verbalize at the vocalic syllable level in 25% of opportunities with max assist multimodal cues.   SLP Short Term Goal 4 (Week 1): Pt will sustain her attention to basic, familiar tasks for 1 minute intervals with max verbal cues for redirection.   SLP Short Term Goal 5 (Week 1): Pt will consume dys 1 textures and nectar thick liquids with mod verbal cues for use of swallowing precautions and minimal overt s/s of aspiration.   SLP Short Term Goal 6 (Week 1): Pt will consume therapeutic trials of thin liquids with mod verbal cues for use of swallowing precautions and minimal overt s/s of aspiration over 3 targeted sessions prior to repeat objective assessment.   Skilled Therapeutic Interventions:   Pt was seen for skilled ST targeting goals for communication and dysphagia.  SLP facilitated the session with therapeutic trials of water following oral care per the water protocol to continue working towards liquids advancement.  Pt demonstrated delayed coughing on 1 out of 5 sips, no other overt s/s of aspiration. Pt needed total hand over hand assist to identify object when named from a field of two but initiated vocalization of 1-2 sounds per word in attempt to name objects with max assist("ba" for ball, "oo" for spoon, "en" for pen).    Pt with approximations for ~25% of words during simple automatic sequences and  when singing in unison with therapist with max assist multimodal cues to initiate vocalization. Pt was returned to room and left in tilt in space wheelchair with safety belt donned and call bell within reach.  Continue per current plan of care.     Function:  Eating Eating   Modified Consistency Diet: Yes Eating Assist Level: Supervision or verbal cues;Help managing cup/glass           Cognition Comprehension Comprehension assist level: Understands basic 25 - 49% of the time/ requires cueing 50 - 75% of the time  Expression   Expression assist level: Expresses basis less than 25% of the time/requires cueing >75% of the time.  Social Interaction Social Interaction assist level: Interacts appropriately 25 - 49% of time - Needs frequent redirection.  Problem Solving Problem solving assist level: Solves basic less than 25% of the time - needs direction nearly all the time or does not effectively solve problems and may need a restraint for safety  Memory Memory assist level: Recognizes or recalls less than 25% of the time/requires cueing greater than 75% of the time    Pain Pain Assessment Pain Assessment: No/denies pain  Therapy/Group: Individual Therapy  Allona Gondek, Selinda Orion 06/25/2017, 1:42 PM

## 2017-06-25 NOTE — Progress Notes (Signed)
Physical Therapy Session Note  Patient Details  Name: Sandra [name] MRN: [id number] Date of Birth: 05-21-1940  Today's Date: 06/25/2017 PT Individual Time: 1545-1600 PT Individual Time Calculation (min): 15 min   Short Term Goals: Week 1:  PT Short Term Goal 1 (Week 1): Pt will demonstrate bed mobility with max assist +1 using bed features PT Short Term Goal 2 (Week 1): Pt will transfer without use of lift equipment in 25% of opportunities PT Short Term Goal 3 (Week 1): Pt will scan to R environment in 50% of observations with min cues. PT Short Term Goal 4 (Week 1): Pt will tolerate out of bed to w/c for 2 hours outside of therapy.   Skilled Therapeutic Interventions/Progress Updates:   Pt in supine and appeared in no distress or pain. Pt shaking head "no" w/ asking pt to sit at EOB to engage in therapist. Pt unable to follow commands from therapist despite increased time and minimizing environmental distractions. Pt performed supine to sit transfer w/ total assist and total cues to initiate, but once movement was initiated, pt able to assist ~10-20% during transfer. Maintain static sitting w/ min assist for 5 minutes while attempting to engage pt w/ 1-step commands. Pt repeatedly attempting to return to supine, returned to supine max assist, ended session in supine, call bell within reach and all needs met. Missed 15 min of skilled PT 2/2 suspected fatigue/refusal.   Therapy Documentation Precautions:  Precautions Precautions: Fall Precaution Comments: dense R hemiparesis, R inattention Restrictions Weight Bearing Restrictions: No Other Position/Activity Restrictions: Positioning of right upper extremity in sight of patient and not pulling on it as it is flaccid.  General: PT Amount of Missed Time (min): 15 Minutes PT Missed Treatment Reason: Patient fatigue Pain: Pain Assessment Pain Assessment: No/denies pain  See Function Navigator for Current Functional  Status.   Therapy/Group: Individual Therapy  Odean Mcelwain K Arnette 06/25/2017, 5:14 PM

## 2017-06-25 NOTE — Progress Notes (Signed)
Physical Therapy Session Note  Patient Details  Name: Sandra [name] MRN: [id number] Date of Birth: Sep 15, 1940  Today's Date: 06/25/2017 PT Individual Time: 1430-1530 PT Individual Time Calculation (min): 60 min   Short Term Goals: Week 1:  PT Short Term Goal 1 (Week 1): Pt will demonstrate bed mobility with max assist +1 using bed features PT Short Term Goal 2 (Week 1): Pt will transfer without use of lift equipment in 25% of opportunities PT Short Term Goal 3 (Week 1): Pt will scan to R environment in 50% of observations with min cues. PT Short Term Goal 4 (Week 1): Pt will tolerate out of bed to w/c for 2 hours outside of therapy.   Skilled Therapeutic Interventions/Progress Updates:    Pt seated in TIS w/c upon therapist arrival. Per faces pain scale pt appears to have some pain in B hips with movement, no pain at rest. Transfer w/c to bed with maximove assist x 2. Rolling R/L with use of bedrails and max assist to remove sling and for brief change and pericare due to bowel incontinence. Supine BLE therex and PROM stretches to prevent LE contracture. Attempt to have pt perform AROM on LLE but pt is only able to follow cues <25% of the time to perform exercise correctly. Pt left semi-reclined in bed with needs in reach, bed alarm in place.  Therapy Documentation Precautions:  Precautions Precautions: Fall Precaution Comments: dense R hemiparesis, R inattention Restrictions Weight Bearing Restrictions: No Other Position/Activity Restrictions: Positioning of right upper extremity in sight of patient and not pulling on it as it is flaccid.   See Function Navigator for Current Functional Status.   Therapy/Group: Individual Therapy  Excell Seltzer, PT, DPT  06/25/2017, 4:05 PM

## 2017-06-26 ENCOUNTER — Inpatient Hospital Stay (HOSPITAL_COMMUNITY): Payer: Medicare Other | Admitting: Occupational Therapy

## 2017-06-26 LAB — GLUCOSE, CAPILLARY
GLUCOSE-CAPILLARY: 94 mg/dL (ref 65–99)
GLUCOSE-CAPILLARY: 180 mg/dL — AB (ref 65–99)
Glucose-Capillary: 124 mg/dL — ABNORMAL HIGH (ref 65–99)
Glucose-Capillary: 197 mg/dL — ABNORMAL HIGH (ref 65–99)

## 2017-06-26 NOTE — Progress Notes (Signed)
Occupational Therapy Session Note  Patient Details  Name: Sandra [name] MRN: [id number] Date of Birth: [birth year]  Today's Date: 06/26/2017 OT Individual Time: 1102-1204 OT Individual Time Calculation (min): 62 min    Short Term Goals: Week 1:  OT Short Term Goal 1 (Week 1): Pt will maintain static sitting balance for  5 min EOM/EOB with min A in prep for toileting OT Short Term Goal 2 (Week 1): Pt will locate 2/2 grooming items on R side of sink with MOD VC OT Short Term Goal 3 (Week 1): Pt will manage RUE with LUE prior to transfer with MOD VC to demonstrate R body attention OT Short Term Goal 4 (Week 1): Pt will transfer to w/c with MAX A of 1 in prep for [facility] transfer  Skilled Therapeutic Interventions/Progress Updates:    Pt greeted supine in bed with no expressions of pain. Nonverbal throughout session. Tx focus on initiation, following 1 step instructions, awareness, Rt attention, and functional bed mobility during bathing completion. With modeling provided, pt washing her face with Lt hand. Also initiated washing chest and abdomen. Elevated L UE for OT to wash arm with instruction. She did this with her L LE as well. Provided sensory input to Rt UE during bathing/lotion application with cues for pt to attend to arm. With Mod A, pt pulling herself forward and towards the Rt for OT to wash back when cued to sit up(!). Mod A rolling to Rt and Max A rolling to Lt for perihygiene. 2 helper assist required post incontinent BM in order to complete hygiene and don clean brief. Pt assisting for 5 second intervals to boost herself up in bed with bed placed in trendelenberg. Repositioned pt for pressure relief using props and hemiplegic positioning techniques. At end of tx pt was left with all needs and bed alarm activated.    Therapy Documentation Precautions:  Precautions Precautions: Fall Precaution Comments: dense R hemiparesis, R inattention Restrictions Weight Bearing Restrictions:  No Other Position/Activity Restrictions: Positioning of right upper extremity in sight of patient and not pulling on it as it is flaccid.   Pain: Pt flinching when OT repositioned R LE during session    ADL:     See Function Navigator for Current Functional Status.   Therapy/Group: Individual Therapy  Davin Archuletta A Juliany Daughety 06/26/2017, 5:33 PM

## 2017-06-26 NOTE — Progress Notes (Signed)
Subjective/Complaints: Patient seen lying in bed this morning. No reported issues overnight. She appears to not when asked if she slept well.  ROS- unable to perform due to aphasia  Objective: Vital Signs: Blood pressure 138/66, pulse 64, temperature (!) 97.5 F (36.4 C), temperature source Oral, resp. rate 17, height 5\' 5"  (1.651 m), weight 88 kg (194 lb 0.1 oz), SpO2 92 %.  Results for orders placed or performed during the hospital encounter of 06/20/17 (from the past 72 hour(s))  Glucose, capillary     Status: Abnormal   Collection Time: 06/23/17 11:54 AM  Result Value Ref Range   Glucose-Capillary 270 (H) 65 - 99 mg/dL  Glucose, capillary     Status: Abnormal   Collection Time: 06/23/17  4:32 PM  Result Value Ref Range   Glucose-Capillary 224 (H) 65 - 99 mg/dL  Glucose, capillary     Status: Abnormal   Collection Time: 06/23/17  9:06 PM  Result Value Ref Range   Glucose-Capillary 197 (H) 65 - 99 mg/dL  Glucose, capillary     Status: Abnormal   Collection Time: 06/24/17  6:48 AM  Result Value Ref Range   Glucose-Capillary 273 (H) 65 - 99 mg/dL  Glucose, capillary     Status: Abnormal   Collection Time: 06/24/17 11:27 AM  Result Value Ref Range   Glucose-Capillary 355 (H) 65 - 99 mg/dL  Glucose, capillary     Status: Abnormal   Collection Time: 06/24/17  4:43 PM  Result Value Ref Range   Glucose-Capillary 138 (H) 65 - 99 mg/dL  Glucose, capillary     Status: None   Collection Time: 06/24/17  9:22 PM  Result Value Ref Range   Glucose-Capillary 96 65 - 99 mg/dL  Glucose, capillary     Status: None   Collection Time: 06/25/17  6:45 AM  Result Value Ref Range   Glucose-Capillary 95 65 - 99 mg/dL  Glucose, capillary     Status: Abnormal   Collection Time: 06/25/17 11:41 AM  Result Value Ref Range   Glucose-Capillary 161 (H) 65 - 99 mg/dL  Glucose, capillary     Status: Abnormal   Collection Time: 06/25/17  4:35 PM  Result Value Ref Range   Glucose-Capillary 307 (H) 65  - 99 mg/dL  Glucose, capillary     Status: Abnormal   Collection Time: 06/25/17  9:39 PM  Result Value Ref Range   Glucose-Capillary 245 (H) 65 - 99 mg/dL   Comment 1 Notify RN   Glucose, capillary     Status: None   Collection Time: 06/26/17  6:39 AM  Result Value Ref Range   Glucose-Capillary 94 65 - 99 mg/dL   Comment 1 Notify RN      HEENT: Normocephalic. Atraumatic. Cardio: RRR and no JVD.Marland Kitchen Resp: CTA B/L and unlaboured GI: BS positive and ND Musculoskeletal:  No tenderness and No Edema Skin:   Warm and dry. Intact. Neuro:  Not following any commands (unchanged).  No spontaneous movement noted. Gen NAD. Vital signs reviewed.  Psych: Blank stare  Assessment/Plan: 1. Functional deficits secondary to Left MCA infarct with R HP, Aphasia which require 3+ hours per day of interdisciplinary therapy in a comprehensive inpatient rehab setting. Physiatrist is providing close team supervision and 24 hour management of active medical problems listed below. Physiatrist and rehab team continue to assess barriers to discharge/monitor patient progress toward functional and medical goals. FIM: Function - Bathing Position: Bed Body parts bathed by patient: Abdomen Body parts bathed by  helper: Right arm, Left arm, Chest, Front perineal area, Buttocks, Right upper leg, Left upper leg, Right lower leg, Left lower leg, Back Assist Level: 2 helpers  Function- Upper Body Dressing/Undressing What is the patient wearing?: Pull over shirt/dress Bra - Perfomed by helper: Thread/unthread right bra strap, Thread/unthread left bra strap, Hook/unhook bra (pull down sports bra) Pull over shirt/dress - Perfomed by helper: Thread/unthread right sleeve, Thread/unthread left sleeve, Put head through opening, Pull shirt over trunk Assist Level: 2 helpers Function - Lower Body Dressing/Undressing What is the patient wearing?: Pants, Non-skid slipper socks Position: Bed Pants- Performed by helper:  Thread/unthread right pants leg, Thread/unthread left pants leg, Pull pants up/down Non-skid slipper socks- Performed by helper: Don/doff right sock, Don/doff left sock Socks - Performed by helper: Don/doff right sock, Don/doff left sock Shoes - Performed by helper: Don/doff right shoe, Don/doff left shoe(slip on) Assist for lower body dressing: 2 Helpers  Function - Toileting Toileting activity did not occur: No continent bowel/bladder event Toileting steps completed by helper: Adjust clothing prior to toileting, Performs perineal hygiene, Adjust clothing after toileting  Function - Air cabin crew transfer activity did not occur: Safety/medical concerns  Function - Chair/bed transfer Chair/bed transfer activity did not occur: Safety/medical concerns Chair/bed transfer method: Other Chair/bed transfer assist level: 2 helpers Chair/bed transfer assistive device: Mechanical lift Mechanical lift: Maximove Chair/bed transfer details: Manual facilitation for weight shifting  Function - Locomotion: Oceanographer activity did not occur: Safety/medical concerns Assist Level: Dependent (Pt equals 0%)(per PT notes, transported to gym) Wheel 50 feet with 2 turns activity did not occur: Safety/medical concerns Wheel 150 feet activity did not occur: Safety/medical concerns Function - Locomotion: Ambulation Ambulation activity did not occur: Safety/medical concerns Walk 10 feet activity did not occur: Safety/medical concerns Walk 50 feet with 2 turns activity did not occur: Safety/medical concerns Walk 150 feet activity did not occur: Safety/medical concerns Walk 10 feet on uneven surfaces activity did not occur: Safety/medical concerns  Function - Comprehension Comprehension: Auditory Comprehension assist level: Understands basic 25 - 49% of the time/ requires cueing 50 - 75% of the time  Function - Expression Expression: Nonverbal Expression assist level: Expresses basis  less than 25% of the time/requires cueing >75% of the time.  Function - Social Interaction Social Interaction assist level: Interacts appropriately 25 - 49% of time - Needs frequent redirection.  Function - Problem Solving Problem solving assist level: Solves basic less than 25% of the time - needs direction nearly all the time or does not effectively solve problems and may need a restraint for safety  Function - Memory Memory assist level: Recognizes or recalls less than 25% of the time/requires cueing greater than 75% of the time Patient normally able to recall (first 3 days only): None of the above  Medical Problem List and Plan: 1. Functional deficits secondary to  due to L-MCA stroke with extension   Aphasia with Right spastic hemiplegia   Cont CIR  2.  DVT Prophylaxis/Anticoagulation: Pharmaceutical: Lovenox 3. Pain Management: tylenol prn 4. Mood: LCSW to follow for evaluation when appropriate.  5. Neuropsych: This patient is not capable of making decisions on her own behalf. 6. Skin/Wound Care: Routine pressure relief measures. Maintain adequate nutritional and hydration status.  7. Fluids/Electrolytes/Nutrition: Monitor I/O.  8. T2DM: Hgb A1C- Monitor BS ac/hs and use SSI for elevated BS. Continue to titrate medications for tighter control.  CBG (last 3)  Recent Labs    06/25/17 1635 06/25/17 2139 06/26/17 0347  GLUCAP 307* 245* 94  on Lantus increase to 35U, on metformin at home, 8U novalog with meals   Labile with 1 elevation above 300 yesterday, continue to monitor for trend 9. HTN: Poorly controlled due to compliance issues.  Vitals:   06/25/17 0538 06/26/17 0409  BP: 128/73 138/66  Pulse: 75 64  Resp: 20 17  Temp: 98.5 F (36.9 C) (!) 97.5 F (36.4 C)  SpO2: 92% 92%    Started amlodipine 2/6,increase to 10mg  if needed    Relatively controlled on 2/10  10. Fever: resolved   off Unasyn per ID. CXR neg , repeat urine neg, no DVT,recent ECHO no evidence of  endocarditis no other symptoms, ask for ID consult FUO Hep A,B,C neg, HIV neg   Appreciate ID recs, signed off.    11. Dysphagia: Continue dysphagia 1, nectar liquids with assistance at meals to help maintain adequate hydration.  12. Recent episode of  Depression: Now onCelexa last month.   13. Obesity             Body mass index is 32.91 kg/m.             Diet and exercise education             Will cont to encourage weight loss to increase endurance and promote overall health 14.  Hypokalemia on supplementation   Potassium 3.2 on 2/5    Labs ordered for tomorrow 15. Leukocytosis   WBCs 10.8 on 2/5, improving    Labs ordered for tomorrow 16. Obesity   Body mass index is 32.28 kg/m.   Diet and exercise education   Encourage weight loss to increase endurance and promote overall health   LOS (Days) 6 A FACE TO FACE EVALUATION WAS PERFORMED  Maggie Dworkin Lorie Phenix 06/26/2017, 8:00 AM

## 2017-06-27 ENCOUNTER — Inpatient Hospital Stay (HOSPITAL_COMMUNITY): Payer: Medicare Other | Admitting: Speech Pathology

## 2017-06-27 ENCOUNTER — Inpatient Hospital Stay (HOSPITAL_COMMUNITY): Payer: Medicare Other

## 2017-06-27 ENCOUNTER — Inpatient Hospital Stay (HOSPITAL_COMMUNITY): Payer: Medicare Other | Admitting: Occupational Therapy

## 2017-06-27 ENCOUNTER — Inpatient Hospital Stay (HOSPITAL_COMMUNITY): Payer: Medicare Other | Admitting: Physical Therapy

## 2017-06-27 DIAGNOSIS — E870 Hyperosmolality and hypernatremia: Secondary | ICD-10-CM

## 2017-06-27 LAB — URINALYSIS, COMPLETE (UACMP) WITH MICROSCOPIC
Bilirubin Urine: NEGATIVE
GLUCOSE, UA: NEGATIVE mg/dL
Hgb urine dipstick: NEGATIVE
Ketones, ur: NEGATIVE mg/dL
NITRITE: NEGATIVE
PH: 5 (ref 5.0–8.0)
Protein, ur: NEGATIVE mg/dL
SPECIFIC GRAVITY, URINE: 1.018 (ref 1.005–1.030)

## 2017-06-27 LAB — BASIC METABOLIC PANEL
ANION GAP: 12 (ref 5–15)
BUN: 37 mg/dL — ABNORMAL HIGH (ref 6–20)
CALCIUM: 8.6 mg/dL — AB (ref 8.9–10.3)
CHLORIDE: 109 mmol/L (ref 101–111)
CO2: 32 mmol/L (ref 22–32)
Creatinine, Ser: 0.78 mg/dL (ref 0.44–1.00)
GFR calc non Af Amer: >60 mL/min (ref >60)
GLUCOSE: 139 mg/dL — AB (ref 65–99)
POTASSIUM: 3.2 mmol/L — AB (ref 3.5–5.1)
Sodium: 153 mmol/L — ABNORMAL HIGH (ref 135–145)

## 2017-06-27 LAB — CBC
HCT: 41 % (ref 36.0–46.0)
HEMOGLOBIN: 13 g/dL (ref 12.0–15.0)
MCH: 28.6 pg (ref 26.0–34.0)
MCHC: 31.7 g/dL (ref 30.0–36.0)
MCV: 90.1 fL (ref 78.0–100.0)
Platelets: 370 10*3/uL (ref 150–400)
RBC: 4.55 MIL/uL (ref 3.87–5.11)
RDW: 15.1 % (ref 11.5–15.5)
WBC: 16.6 10*3/uL — AB (ref 4.0–10.5)

## 2017-06-27 LAB — MAGNESIUM: MAGNESIUM: 2.6 mg/dL — AB (ref 1.7–2.4)

## 2017-06-27 LAB — GLUCOSE, CAPILLARY
Glucose-Capillary: 122 mg/dL — ABNORMAL HIGH (ref 65–99)
Glucose-Capillary: 274 mg/dL — ABNORMAL HIGH (ref 65–99)
Glucose-Capillary: 255 mg/dL — ABNORMAL HIGH (ref 65–99)
Glucose-Capillary: 130 mg/dL — ABNORMAL HIGH (ref 65–99)

## 2017-06-27 MED ORDER — POTASSIUM CHLORIDE CRYS ER 20 MEQ PO TBCR
30.0000 meq | EXTENDED_RELEASE_TABLET | Freq: Two times a day (BID) | ORAL | Status: AC
  Administered 2017-06-27 – 2017-06-28 (×3): 30 meq via ORAL
  Filled 2017-06-27 (×3): qty 1

## 2017-06-27 MED ORDER — SODIUM CHLORIDE 0.45 % IV SOLN
INTRAVENOUS | Status: DC
  Administered 2017-06-27 – 2017-07-01 (×4): via INTRAVENOUS

## 2017-06-27 NOTE — Progress Notes (Signed)
Occupational Therapy Session Note  Patient Details  Name: Sandra [name] MRN: [id number] Date of Birth: January 22, 1941  Today's Date: 06/27/2017 OT Individual Time: 1003-1100 OT Individual Time Calculation (min): 57 min    Short Term Goals: Week 1:  OT Short Term Goal 1 (Week 1): Pt will maintain static sitting balance for  5 min EOM/EOB with min A in prep for toileting OT Short Term Goal 2 (Week 1): Pt will locate 2/2 grooming items on R side of sink with MOD VC OT Short Term Goal 3 (Week 1): Pt will manage RUE with LUE prior to transfer with MOD VC to demonstrate R body attention OT Short Term Goal 4 (Week 1): Pt will transfer to w/c with MAX A of 1 in prep for [facility] transfer  Skilled Therapeutic Interventions/Progress Updates:    OT treatment session focused on attention, initiation, sitting balance, within modified bathing/dressing task. Pt needed hand over hand A to initiate washing abdomen. Pt noted to be incontinent of stool. Pt able to follow command to roll to the R with supervision and max A to roll L for total A +2 brief change and peri-care. Pt followed 1 step commands to lift R and L LE into pants. Pt then reached towards pants and initiated hip bridge to attempt to pull up pants.  Provided R LE support and cued pt to bridge hips again but unable to achieve full hip bridge to pull pants up over R hip. Maximove transfer to TIS wc. Pt brought to the sink for UB bathing/dressing. Pt initiated washing chest and face with set-up A. Backwards chaining used to don shirt with pt able to thread L UE and put head through opening without instruction. Pt said the word "OK" today. Pt left seated in TIS wc with needs met and call bel lin hand.   Therapy Documentation Precautions:  Precautions Precautions: Fall Precaution Comments: dense R hemiparesis, R inattention Restrictions Weight Bearing Restrictions: No Other Position/Activity Restrictions: Positioning of right upper extremity in sight of  patient and not pulling on it as it is flaccid.   See Function Navigator for Current Functional Status.   Therapy/Group: Individual Therapy  Valma Cava 06/27/2017, 10:32 AM

## 2017-06-27 NOTE — Progress Notes (Signed)
Physical Therapy Session Note  Patient Details  Name: Sandra [name] MRN: [id number] Date of Birth: 1941/01/10  Today's Date: 06/27/2017 PT Individual Time: 1400-1515 PT Individual Time Calculation (min): 75 min   Short Term Goals: Week 1:  PT Short Term Goal 1 (Week 1): Pt will demonstrate bed mobility with max assist +1 using bed features PT Short Term Goal 2 (Week 1): Pt will transfer without use of lift equipment in 25% of opportunities PT Short Term Goal 3 (Week 1): Pt will scan to R environment in 50% of observations with min cues. PT Short Term Goal 4 (Week 1): Pt will tolerate out of bed to w/c for 2 hours outside of therapy.   Skilled Therapeutic Interventions/Progress Updates:    no c/o pain.  Session focus on attention, visual scanning, following 1-step commands and simple problem solving.  Use of hoyer to transfer pt to and from w/c at start of session.  Pt engaged in simple sorting task into a field of 2 focus with fluctuating min/mod multimodal cues and increased time.  Progressed to graded clothespin activity focus on sustained attention and problem solving, pt able to pin 12 clothespins with initial mod fade to min multimodal cues and increased time.  PT adjusted pt's leg rests for improved positioning and pressure relief for increased sitting tolerance. Pt returned to room at end of session, rolling R to remove hoyer sling on first try without cues.  Pt positioned to comfort with call bell in reach and needs met.   Therapy Documentation Precautions:  Precautions Precautions: Fall Precaution Comments: dense R hemiparesis, R inattention Restrictions Weight Bearing Restrictions: No Other Position/Activity Restrictions: Positioning of right upper extremity in sight of patient and not pulling on it as it is flaccid.    See Function Navigator for Current Functional Status.   Therapy/Group: Individual Therapy  Michel Santee 06/27/2017, 3:21 PM

## 2017-06-27 NOTE — Progress Notes (Addendum)
Subjective/Complaints: Pt seen lying in bed this AM.  No reported issues overnight. She briefly opens her eyes.   ROS- Unable to perform due to aphasia  Objective: Vital Signs: Blood pressure 140/76, pulse 65, temperature 97.9 F (36.6 C), temperature source Oral, resp. rate 16, height _0  (1.651 m), weight 88 kg (194 lb 0.1 oz), SpO2 96 %.  Results for orders placed or performed during the hospital encounter of 06/20/17 (from the past 72 hour(s))  Glucose, capillary     Status: Abnormal   Collection Time: 06/24/17 11:27 AM  Result Value Ref Range   Glucose-Capillary 355 (H) 65 - 99 mg/dL  Glucose, capillary     Status: Abnormal   Collection Time: 06/24/17  4:43 PM  Result Value Ref Range   Glucose-Capillary 138 (H) 65 - 99 mg/dL  Glucose, capillary     Status: None   Collection Time: 06/24/17  9:22 PM  Result Value Ref Range   Glucose-Capillary 96 65 - 99 mg/dL  Glucose, capillary     Status: None   Collection Time: 06/25/17  6:45 AM  Result Value Ref Range   Glucose-Capillary 95 65 - 99 mg/dL  Glucose, capillary     Status: Abnormal   Collection Time: 06/25/17 11:41 AM  Result Value Ref Range   Glucose-Capillary 161 (H) 65 - 99 mg/dL  Glucose, capillary     Status: Abnormal   Collection Time: 06/25/17  4:35 PM  Result Value Ref Range   Glucose-Capillary 307 (H) 65 - 99 mg/dL  Glucose, capillary     Status: Abnormal   Collection Time: 06/25/17  9:39 PM  Result Value Ref Range   Glucose-Capillary 245 (H) 65 - 99 mg/dL   Comment 1 Notify RN   Glucose, capillary     Status: None   Collection Time: 06/26/17  6:39 AM  Result Value Ref Range   Glucose-Capillary 94 65 - 99 mg/dL   Comment 1 Notify RN   Glucose, capillary     Status: Abnormal   Collection Time: 06/26/17 11:26 AM  Result Value Ref Range   Glucose-Capillary 180 (H) 65 - 99 mg/dL  Glucose, capillary     Status: Abnormal   Collection Time: 06/26/17  5:02 PM  Result Value Ref Range   Glucose-Capillary 124  (H) 65 - 99 mg/dL  Glucose, capillary     Status: Abnormal   Collection Time: 06/26/17  9:02 PM  Result Value Ref Range   Glucose-Capillary 197 (H) 65 - 99 mg/dL   Comment 1 Notify RN   CBC     Status: Abnormal   Collection Time: 06/27/17  5:14 AM  Result Value Ref Range   WBC 16.6 (H) 4.0 - 10.5 K/uL   RBC 4.55 3.87 - 5.11 MIL/uL   Hemoglobin 13.0 12.0 - 15.0 g/dL   HCT 41.0 36.0 - 46.0 %   MCV 90.1 78.0 - 100.0 fL   MCH 28.6 26.0 - 34.0 pg   MCHC 31.7 30.0 - 36.0 g/dL   RDW 15.1 11.5 - 15.5 %   Platelets 370 150 - 400 K/uL    Comment: Performed at [facility], [address], [place], [place] [address]  Basic metabolic panel     Status: Abnormal   Collection Time: 06/27/17  5:14 AM  Result Value Ref Range   Sodium 153 (H) 135 - 145 mmol/L   Potassium 3.2 (L) 3.5 - 5.1 mmol/L   Chloride 109 101 - 111 mmol/L   CO2  32 22 - 32 mmol/L   Glucose, Bld 139 (H) 65 - 99 mg/dL   BUN 37 (H) 6 - 20 mg/dL   Creatinine, Ser 0.78 0.44 - 1.00 mg/dL   Calcium 8.6 (L) 8.9 - 10.3 mg/dL   GFR calc non Af Amer >60 >60 mL/min   GFR calc Af Amer >60 >60 mL/min    Comment: (NOTE) The eGFR has been calculated using the CKD EPI equation. This calculation has not been validated in all clinical situations. eGFR's persistently <60 mL/min signify possible Chronic Kidney Disease.    Anion gap 12 5 - 15    Comment: Performed at [facility] [address], [place], [place] [address]  Glucose, capillary     Status: Abnormal   Collection Time: 06/27/17  6:30 AM  Result Value Ref Range   Glucose-Capillary 122 (H) 65 - 99 mg/dL   Comment 1 Notify RN      HEENT: Normocephalic. Atraumatic. Cardio: RRR and no JVD.Marland Kitchen Resp: CTA B/L and Unlaboured GI: BS positive and ND Musculoskeletal:  No tenderness and No Edema Skin:   Warm and dry. Intact. Neuro:  Not following any commands, however, seen spontaneously moving LUE against gravity to stretch.  Gen NAD. Vital signs reviewed.  Psych:  Unable to assess due to aphasia  Assessment/Plan: 1. Functional deficits secondary to Left MCA infarct with R HP, Aphasia which require 3+ hours per day of interdisciplinary therapy in a comprehensive inpatient rehab setting. Physiatrist is providing close team supervision and 24 hour management of active medical problems listed below. Physiatrist and rehab team continue to assess barriers to discharge/monitor patient progress toward functional and medical goals. FIM: Function - Bathing Position: Bed Body parts bathed by patient: Abdomen, Chest Body parts bathed by helper: Right arm, Left arm, Front perineal area, Buttocks, Right upper leg, Left upper leg, Right lower leg, Left lower leg, Back Assist Level: 2 helpers  Function- Upper Body Dressing/Undressing What is the patient wearing?: Pull over shirt/dress Bra - Perfomed by helper: Thread/unthread right bra strap, Thread/unthread left bra strap, Hook/unhook bra (pull down sports bra) Pull over shirt/dress - Perfomed by patient: Thread/unthread left sleeve Pull over shirt/dress - Perfomed by helper: Put head through opening, Pull shirt over trunk, Thread/unthread right sleeve Assist Level: 2 helpers Function - Lower Body Dressing/Undressing What is the patient wearing?: Pants, Non-skid slipper socks Position: Bed Pants- Performed by helper: Thread/unthread right pants leg, Thread/unthread left pants leg, Pull pants up/down Non-skid slipper socks- Performed by helper: Don/doff right sock, Don/doff left sock Socks - Performed by helper: Don/doff right sock, Don/doff left sock Shoes - Performed by helper: Don/doff right shoe, Don/doff left shoe(slip on) Assist for footwear: Dependant Assist for lower body dressing: 2 Helpers  Function - Toileting Toileting activity did not occur: No continent bowel/bladder event Toileting steps completed by helper: Adjust clothing prior to toileting, Performs perineal hygiene, Adjust clothing after  toileting Assist level: Two helpers  Function - Air cabin crew transfer activity did not occur: Safety/medical concerns  Function - Chair/bed transfer Chair/bed transfer activity did not occur: Safety/medical concerns Chair/bed transfer method: Other Chair/bed transfer assist level: 2 helpers Chair/bed transfer assistive device: Mechanical lift Mechanical lift: Maximove Chair/bed transfer details: Manual facilitation for weight shifting  Function - Locomotion: Oceanographer activity did not occur: Safety/medical concerns Assist Level: Dependent (Pt equals 0%)(per PT notes, transported to gym) Wheel 50 feet with 2 turns activity did not occur: Safety/medical concerns Wheel 150 feet activity did not  occur: Safety/medical concerns Function - Locomotion: Ambulation Ambulation activity did not occur: Safety/medical concerns Walk 10 feet activity did not occur: Safety/medical concerns Walk 50 feet with 2 turns activity did not occur: Safety/medical concerns Walk 150 feet activity did not occur: Safety/medical concerns Walk 10 feet on uneven surfaces activity did not occur: Safety/medical concerns  Function - Comprehension Comprehension: Auditory Comprehension assist level: Understands basic 25 - 49% of the time/ requires cueing 50 - 75% of the time  Function - Expression Expression: Nonverbal Expression assist level: Expresses basis less than 25% of the time/requires cueing >75% of the time.  Function - Social Interaction Social Interaction assist level: Interacts appropriately 25 - 49% of time - Needs frequent redirection.  Function - Problem Solving Problem solving assist level: Solves basic less than 25% of the time - needs direction nearly all the time or does not effectively solve problems and may need a restraint for safety  Function - Memory Memory assist level: Recognizes or recalls less than 25% of the time/requires cueing greater than 75% of the  time Patient normally able to recall (first 3 days only): None of the above  Medical Problem List and Plan: 1. Functional deficits secondary to  due to L-MCA stroke with extension   Aphasia with Right spastic hemiplegia   Cont CIR  2.  DVT Prophylaxis/Anticoagulation: Pharmaceutical: Lovenox 3. Pain Management: tylenol prn 4. Mood: LCSW to follow for evaluation when appropriate.  5. Neuropsych: This patient is not capable of making decisions on her own behalf. 6. Skin/Wound Care: Routine pressure relief measures. Maintain adequate nutritional and hydration status.  7. Fluids/Electrolytes/Nutrition: Monitor I/O.  8. T2DM: Hgb A1C- Monitor BS ac/hs and use SSI for elevated BS. Continue to titrate medications for tighter control.  CBG (last 3)  Recent Labs    06/26/17 1702 06/26/17 2102 06/27/17 0630  GLUCAP 124* 197* 122*  on Lantus increase to 35U, on metformin at home, 8U novalog with meals   Overall controlled on 2/11 9. HTN: Poorly controlled due to compliance issues.  Vitals:   06/26/17 1300 06/27/17 0500  BP: (!) 156/62 140/76  Pulse: 72 65  Resp: 18 16  Temp: 98 F (36.7 C) 97.9 F (36.6 C)  SpO2: 92% 96%    Started amlodipine 2/6,increase to 55m if needed    Relatively controlled on 2/11 10. Fever: resolved   off Unasyn per ID. CXR neg , repeat urine neg, no DVT,recent ECHO no evidence of endocarditis no other symptoms, ask for ID consult FUO Hep A,B,C neg, HIV neg   Appreciate ID recs, signed off.    11. Dysphagia: Continue dysphagia 1, nectar liquids with assistance at meals to help maintain adequate hydration.  12. Recent episode of  Depression: Now onCelexa last month.   13. Obesity             Body mass index is 32.91 kg/m.             Diet and exercise education             Will cont to encourage weight loss to increase endurance and promote overall health 14.  Hypokalemia    Supplemented x3 days   Potassium 3.2 on 2/11   Labs ordered for tomorrow   Mag  ordered 15. Leukocytosis:    WBCs 16.6 on 2/11  Afebrile   Cont to monitor   Previous workup negative   Repeat UA ordered 16. Obesity   Body mass index is 32.28 kg/m.  Diet and exercise education   Encourage weight loss to increase endurance and promote overall health 17. Hypernatremia  Na+ 153 on 2/11   Likely dehydration   IVF started on 2/11   Labs ordered for tomorrow   LOS (Days) 7 A FACE TO FACE EVALUATION WAS PERFORMED  Sandra [name] 06/27/2017, 10:38 AM

## 2017-06-27 NOTE — Progress Notes (Addendum)
Speech Language Pathology Weekly Progress and Session Note  Patient Details  Name: Sandra [name] MRN: [id number] Date of Birth: [birth year]  Beginning of progress report period: June 21, 2017 End of progress report period: June 27, 2017  Today's Date: 06/27/2017 SLP Individual Time: 1100-1115 SLP Individual Time Calculation (min): 15 min  Short Term Goals: Week 1: SLP Short Term Goal 1 (Week 1): Pt will answer yes/no immediate, personally relevant questions in 50% of opportunities with max assist multimodal cues.   SLP Short Term Goal 1 - Progress (Week 1): Not met SLP Short Term Goal 2 (Week 1): Pt will follow 1 step commands for 50% accuracy during basic, familiar tasks with max assist multimodal cues.   SLP Short Term Goal 2 - Progress (Week 1): Not met SLP Short Term Goal 3 (Week 1): Pt will verbalize at the vocalic syllable level in 25% of opportunities with max assist multimodal cues.   SLP Short Term Goal 3 - Progress (Week 1): Not met SLP Short Term Goal 4 (Week 1): Pt will sustain her attention to basic, familiar tasks for 1 minute intervals with max verbal cues for redirection.   SLP Short Term Goal 4 - Progress (Week 1): Not met SLP Short Term Goal 5 (Week 1): Pt will consume dys 1 textures and nectar thick liquids with mod verbal cues for use of swallowing precautions and minimal overt s/s of aspiration.   SLP Short Term Goal 5 - Progress (Week 1): Met SLP Short Term Goal 6 (Week 1): Pt will consume therapeutic trials of thin liquids with mod verbal cues for use of swallowing precautions and minimal overt s/s of aspiration over 3 targeted sessions prior to repeat objective assessment.  SLP Short Term Goal 6 - Progress (Week 1): Met    New Short Term Goals: Week 2: SLP Short Term Goal 1 (Week 2): Pt will answer yes/no immediate, personally relevant questions in 50% of opportunities with max assist multimodal cues.   SLP Short Term Goal 2 (Week 2): Pt will follow 1 step  commands for 50% accuracy during basic, familiar tasks with max assist multimodal cues.   SLP Short Term Goal 3 (Week 2): Pt will sustain her attention to basic, familiar tasks for 1 minute intervals with max verbal cues for redirection.   SLP Short Term Goal 4 (Week 2): Pt will consume dys 1 textures and thin liquids with mod verbal cues for use of swallowing precautions and minimal overt s/s of aspiration.   SLP Short Term Goal 5 (Week 2): Pt will initiate  basic familiar task (such as self-feeding) in 50% of opportunities with Max A cues.  SLP Short Term Goal 6 (Week 2): Given Max A cues, pt will select requested object from field of 2 in 50% of opportunities.   Weekly Progress Updates: Pt with minimal progress this reporting and as a result pt is now able to consume thin liquids via restricted flow cup of 5cc each bolus. Pt with no appreciable progress over reporting period and continues to require Total A to Max A for all goals. Skilled ST is required to address these deficits and suspect pt will require follow-up services at skilled nursing facility. Prognosis is guarded at this point.      Intensity: Minumum of 1-2 x/day, 30 to 90 minutes Frequency: 3 to 5 out of 7 days Duration/Length of Stay: 21-28 days Treatment/Interventions: Cognitive remediation/compensation;Cueing hierarchy;Dysphagia/aspiration precaution training;Functional tasks;Environmental controls;Internal/external aids;Multimodal communication approach;Patient/family education;Speech/Language facilitation   Daily Session  Skilled  Therapeutic Interventions:  Skilled treatment session focused on competing caregiver education with nursing on results of MBSS and demonstration of Provale (restrictied flow cup 5 cc) cup with pt. Pt with little to no task initiation of picking up cup to drink from it. Requires Max A cues. Pt was left upright in chair with all needs within reach. Continue per current plan of care.       Function:    Eating Eating   Modified Consistency Diet: Yes Eating Assist Level: Supervision or verbal cues;Help managing cup/glass   Eating Set Up Assist For: Opening containers       Cognition Comprehension Comprehension assist level: Understands basic less than 25% of the time/ requires cueing >75% of the time  Expression   Expression assist level: Expresses basis less than 25% of the time/requires cueing >75% of the time.  Social Interaction Social Interaction assist level: Interacts appropriately less than 25% of the time. May be withdrawn or combative.;Interacts appropriately 25 - 49% of time - Needs frequent redirection.  Problem Solving Problem solving assist level: Solves basic less than 25% of the time - needs direction nearly all the time or does not effectively solve problems and may need a restraint for safety  Memory Memory assist level: Recognizes or recalls less than 25% of the time/requires cueing greater than 75% of the time   General    Pain    Therapy/Group: Individual Therapy  Sandra [name] 06/27/2017, 11:35 AM

## 2017-06-27 NOTE — Progress Notes (Signed)
Speech Language Pathology Daily Session Note  Patient Details  Name: Sandra [name] MRN: [id number] Date of Birth: November 14, 1940  Today's Date: 06/27/2017 SLP Individual Time: 1300-1330 SLP Individual Time Calculation (min): 30 min  Short Term Goals: Week 2: SLP Short Term Goal 1 (Week 2): Pt will answer yes/no immediate, personally relevant questions in 50% of opportunities with max assist multimodal cues.   SLP Short Term Goal 2 (Week 2): Pt will follow 1 step commands for 50% accuracy during basic, familiar tasks with max assist multimodal cues.   SLP Short Term Goal 3 (Week 2): Pt will sustain her attention to basic, familiar tasks for 1 minute intervals with max verbal cues for redirection.   SLP Short Term Goal 4 (Week 2): Pt will consume dys 1 textures and thin liquids with mod verbal cues for use of swallowing precautions and minimal overt s/s of aspiration.   SLP Short Term Goal 5 (Week 2): Pt will initiate  basic familiar task (such as self-feeding) in 50% of opportunities with Max A cues.  SLP Short Term Goal 6 (Week 2): Given Max A cues, pt will select requested object from field of 2 in 50% of opportunities.   Skilled Therapeutic Interventions: Skilled treatment session focused on communication goals. Upon arrival, patient was awake while upright in the wheelchair. SLP facilitated session by providing total A for patient to identify functional items from a field of two accurately (patient always chose the item in left hand). However, patient initiated "touching" both items with extra and Min A verbal cues in 100% of opportunities in order for SLP to ensure patient could see both objects. Patient spontaneously verbalized "yes" X 2 and "no" X 1 and utilized intonation and head nods to answer basic biographical questions with 80% accuracy. Patient also approximated words to "Happy Birthday." with Max A multimodal cues. Patient left upright in wheelchair with all needs within reach. Continue  with current plan of care.      Function:   Cognition Comprehension Comprehension assist level: Understands basic less than 25% of the time/ requires cueing >75% of the time  Expression   Expression assist level: Expresses basis less than 25% of the time/requires cueing >75% of the time.  Social Interaction Social Interaction assist level: Interacts appropriately less than 25% of the time. May be withdrawn or combative.;Interacts appropriately 25 - 49% of time - Needs frequent redirection.  Problem Solving Problem solving assist level: Solves basic less than 25% of the time - needs direction nearly all the time or does not effectively solve problems and may need a restraint for safety  Memory Memory assist level: Recognizes or recalls less than 25% of the time/requires cueing greater than 75% of the time    Pain Discomfort in chair, patient repositioned, RN aware.   Therapy/Group: Individual Therapy  Surah Pelley, [place] 06/27/2017, 1:39 PM

## 2017-06-27 NOTE — Progress Notes (Signed)
Modified Barium Swallow Progress Note  Patient Details  Name: Sandra [name] MRN: [id number] Date of Birth: 1941/02/10  Today's Date: 06/27/2017  Modified Barium Swallow completed.  Full report located under Chart Review in the Imaging Section.  Brief recommendations include the following:  Clinical Impression  Pt presents with moderate to severe oral phase and mild phayrngeal phase dysphagia. Pt's oral phase appears related to cognitive deficits of inattention to oral boluses, ineffective oral containment leading to premature spillage of all boluses consumed. With puree boluses, pt with no lingual manipulation of puree, cognitive inability to perform any compensatory strategies (including presentation of dry spoon) to increase posterior progress of bolus. Pt with mild sensory based pharyngeal impairments c/b delayed swallow initiation to vallucula with nectar thick and puree. Pt with further delayed as bolus falls from vallucula to pyriform sinuses. During transition from vallecula to pyriform sinuses, pt with penetration before and during swallow which eventually resulted in silent aspiration. Pt with no penentration or aspiration when consuming small controlled sips of thin liquids - however pt unable to control bolus size herself and required hand over hand during study to control cupand bolus size. Max A chin was effective in eliminating penetration if pt could perform at some point. In the meantime, recommend upgrade to thin liquids via Provale cup to restrict bolus size to 5 cc, continue puree diet. Of note, previous MBSS reviewed and pt's swallow appears more impaired on today's study with no functional improvements as initial MBSS recommended water protocol as pt was safe with water inbetween meals. Prognosis for further improvement is poor given pt severity of pt's global aphasia and cognitive deficits.    Swallow Evaluation Recommendations       SLP Diet Recommendations: Dysphagia 1  (Puree) solids;Thin liquid   Liquid Administration via: Other (Comment)(Provale cup)   Medication Administration: Crushed with puree   Supervision: Full assist for feeding;Full supervision/cueing for compensatory strategies   Compensations: Minimize environmental distractions;Slow rate;Small sips/bites;Lingual sweep for clearance of pocketing(MUST use Provale cup)   Postural Changes: Seated upright at 90 degrees   Oral Care Recommendations: Oral care before and after PO;Staff/trained caregiver to provide oral care        Hailey Miles 06/27/2017,11:15 AM

## 2017-06-28 ENCOUNTER — Inpatient Hospital Stay (HOSPITAL_COMMUNITY): Payer: Medicare Other

## 2017-06-28 ENCOUNTER — Inpatient Hospital Stay (HOSPITAL_COMMUNITY): Payer: Medicare Other | Admitting: Physical Therapy

## 2017-06-28 ENCOUNTER — Inpatient Hospital Stay (HOSPITAL_COMMUNITY): Payer: Medicare Other | Admitting: Occupational Therapy

## 2017-06-28 ENCOUNTER — Inpatient Hospital Stay (HOSPITAL_COMMUNITY): Payer: Medicare Other | Admitting: Speech Pathology

## 2017-06-28 LAB — GLUCOSE, CAPILLARY
GLUCOSE-CAPILLARY: 184 mg/dL — AB (ref 65–99)
GLUCOSE-CAPILLARY: 74 mg/dL (ref 65–99)
GLUCOSE-CAPILLARY: 92 mg/dL (ref 65–99)
Glucose-Capillary: 92 mg/dL (ref 65–99)
Glucose-Capillary: 57 mg/dL — ABNORMAL LOW (ref 65–99)

## 2017-06-28 MED ORDER — PREMIER PROTEIN SHAKE
2.0000 [oz_av] | Freq: Four times a day (QID) | ORAL | Status: DC
  Administered 2017-06-29 – 2017-07-12 (×41): 2 [oz_av] via ORAL
  Filled 2017-06-28 (×82): qty 325.31

## 2017-06-28 NOTE — Progress Notes (Signed)
Speech Language Pathology Daily Session Note  Patient Details  Name: Sandra [name] MRN: [id number] Date of Birth: 06-17-1940  Today's Date: 06/28/2017 SLP Individual Time: 1030-1100 SLP Individual Time Calculation (min): 30 min  Short Term Goals: Week 2: SLP Short Term Goal 1 (Week 2): Pt will answer yes/no immediate, personally relevant questions in 50% of opportunities with max assist multimodal cues.   SLP Short Term Goal 2 (Week 2): Pt will follow 1 step commands for 50% accuracy during basic, familiar tasks with max assist multimodal cues.   SLP Short Term Goal 3 (Week 2): Pt will sustain her attention to basic, familiar tasks for 1 minute intervals with max verbal cues for redirection.   SLP Short Term Goal 4 (Week 2): Pt will consume dys 1 textures and thin liquids with mod verbal cues for use of swallowing precautions and minimal overt s/s of aspiration.   SLP Short Term Goal 5 (Week 2): Pt will initiate  basic familiar task (such as self-feeding) in 50% of opportunities with Max A cues.  SLP Short Term Goal 6 (Week 2): Given Max A cues, pt will select requested object from field of 2 in 50% of opportunities.   Skilled Therapeutic Interventions: Skilled treatment session focused on dysphagia and cognitive goals. SLP facilitated session by providing Mod A verbal cues for patient to utilize single sips of thin liquids. Patient consumed liquids without overt s/s of aspiration. SLP also facilitated by providing Mod-Max A verbal cues for matching colors from a field of 3 and 4. However, patient required Max A multimodal cues to identify colors from a field of 2 with 50% accuracy. Patient demonstrated selective attention to tasks in a mildly distracting environment for ~2 minutes with Mod A verbal cues for redirection. Patient also initiated all tasks with extra time and Min A verbal and visual cues. Patient left upright in wheelchair with all needs within reach. Continue with current plan of  care.      Function:  Eating Eating   Modified Consistency Diet: Yes Eating Assist Level: Supervision or verbal cues;Help managing cup/glass       Helper Brings Food to Mouth: Occasionally   Cognition Comprehension Comprehension assist level: Understands basic less than 25% of the time/ requires cueing >75% of the time  Expression   Expression assist level: Expresses basis less than 25% of the time/requires cueing >75% of the time.  Social Interaction Social Interaction assist level: Interacts appropriately less than 25% of the time. May be withdrawn or combative.;Interacts appropriately 25 - 49% of time - Needs frequent redirection.  Problem Solving Problem solving assist level: Solves basic less than 25% of the time - needs direction nearly all the time or does not effectively solve problems and may need a restraint for safety  Memory Memory assist level: Recognizes or recalls less than 25% of the time/requires cueing greater than 75% of the time    Pain Pain Assessment Pain Assessment: No/denies pain  Therapy/Group: Individual Therapy  Shell Yandow 06/28/2017, 11:18 AM

## 2017-06-28 NOTE — Progress Notes (Signed)
Subjective/Complaints:  No issues overnite  ROS- Unable to perform due to aphasia  Objective: Vital Signs: Blood pressure (!) 151/57, pulse 77, temperature 97.7 F (36.5 C), temperature source Oral, resp. rate 15, height _0  (1.651 m), weight 88 kg (194 lb 0.1 oz), SpO2 93 %.  Results for orders placed or performed during the hospital encounter of 06/20/17 (from the past 72 hour(s))  Glucose, capillary     Status: Abnormal   Collection Time: 06/25/17 11:41 AM  Result Value Ref Range   Glucose-Capillary 161 (H) 65 - 99 mg/dL  Glucose, capillary     Status: Abnormal   Collection Time: 06/25/17  4:35 PM  Result Value Ref Range   Glucose-Capillary 307 (H) 65 - 99 mg/dL  Glucose, capillary     Status: Abnormal   Collection Time: 06/25/17  9:39 PM  Result Value Ref Range   Glucose-Capillary 245 (H) 65 - 99 mg/dL   Comment 1 Notify RN   Glucose, capillary     Status: None   Collection Time: 06/26/17  6:39 AM  Result Value Ref Range   Glucose-Capillary 94 65 - 99 mg/dL   Comment 1 Notify RN   Glucose, capillary     Status: Abnormal   Collection Time: 06/26/17 11:26 AM  Result Value Ref Range   Glucose-Capillary 180 (H) 65 - 99 mg/dL  Glucose, capillary     Status: Abnormal   Collection Time: 06/26/17  5:02 PM  Result Value Ref Range   Glucose-Capillary 124 (H) 65 - 99 mg/dL  Glucose, capillary     Status: Abnormal   Collection Time: 06/26/17  9:02 PM  Result Value Ref Range   Glucose-Capillary 197 (H) 65 - 99 mg/dL   Comment 1 Notify RN   CBC     Status: Abnormal   Collection Time: 06/27/17  5:14 AM  Result Value Ref Range   WBC 16.6 (H) 4.0 - 10.5 K/uL   RBC 4.55 3.87 - 5.11 MIL/uL   Hemoglobin 13.0 12.0 - 15.0 g/dL   HCT 41.0 36.0 - 46.0 %   MCV 90.1 78.0 - 100.0 fL   MCH 28.6 26.0 - 34.0 pg   MCHC 31.7 30.0 - 36.0 g/dL   RDW 15.1 11.5 - 15.5 %   Platelets 370 150 - 400 K/uL    Comment: Performed at [facility], [address], [place], [place] [address]   Basic metabolic panel     Status: Abnormal   Collection Time: 06/27/17  5:14 AM  Result Value Ref Range   Sodium 153 (H) 135 - 145 mmol/L   Potassium 3.2 (L) 3.5 - 5.1 mmol/L   Chloride 109 101 - 111 mmol/L   CO2 32 22 - 32 mmol/L   Glucose, Bld 139 (H) 65 - 99 mg/dL   BUN 37 (H) 6 - 20 mg/dL   Creatinine, Ser 0.78 0.44 - 1.00 mg/dL   Calcium 8.6 (L) 8.9 - 10.3 mg/dL   GFR calc non Af Amer >60 >60 mL/min   GFR calc Af Amer >60 >60 mL/min    Comment: (NOTE) The eGFR has been calculated using the CKD EPI equation. This calculation has not been validated in all clinical situations. eGFR's persistently <60 mL/min signify possible Chronic Kidney Disease.    Anion gap 12 5 - 15    Comment: Performed at [facility] [address], [place], [place] [address]  Magnesium     Status: Abnormal   Collection Time: 06/27/17  5:14 AM  Result Value Ref Range   Magnesium 2.6 (H) 1.7 - 2.4 mg/dL    Comment: Performed at [facility] [address] [place], [place] [address]  Glucose, capillary     Status: Abnormal   Collection Time: 06/27/17  6:30 AM  Result Value Ref Range   Glucose-Capillary 122 (H) 65 - 99 mg/dL   Comment 1 Notify RN   Urinalysis, Complete w Microscopic     Status: Abnormal   Collection Time: 06/27/17 10:45 AM  Result Value Ref Range   Color, Urine YELLOW YELLOW   APPearance HAZY (A) CLEAR   Specific Gravity, Urine 1.018 1.005 - 1.030   pH 5.0 5.0 - 8.0   Glucose, UA NEGATIVE NEGATIVE mg/dL   Hgb urine dipstick NEGATIVE NEGATIVE   Bilirubin Urine NEGATIVE NEGATIVE   Ketones, ur NEGATIVE NEGATIVE mg/dL   Protein, ur NEGATIVE NEGATIVE mg/dL   Nitrite NEGATIVE NEGATIVE   Leukocytes, UA SMALL (A) NEGATIVE   RBC / HPF 0-5 0 - 5 RBC/hpf   WBC, UA 6-30 0 - 5 WBC/hpf   Bacteria, UA MANY (A) NONE SEEN   Squamous Epithelial / LPF 0-5 (A) NONE SEEN   Mucus PRESENT     Comment: Performed at [facility], [place] [address] [place], [place] [address]   Glucose, capillary     Status: Abnormal   Collection Time: 06/27/17 11:17 AM  Result Value Ref Range   Glucose-Capillary 274 (H) 65 - 99 mg/dL  Glucose, capillary     Status: Abnormal   Collection Time: 06/27/17  4:39 PM  Result Value Ref Range   Glucose-Capillary 255 (H) 65 - 99 mg/dL  Glucose, capillary     Status: Abnormal   Collection Time: 06/27/17  8:52 PM  Result Value Ref Range   Glucose-Capillary 130 (H) 65 - 99 mg/dL   Comment 1 Notify RN   Glucose, capillary     Status: None   Collection Time: 06/28/17  7:21 AM  Result Value Ref Range   Glucose-Capillary 92 65 - 99 mg/dL     HEENT: Normocephalic. Atraumatic. Cardio: RRR and no JVD.Marland Kitchen Resp: CTA B/L and Unlaboured GI: BS positive and ND Musculoskeletal:  No tenderness and No Edema Skin:   Warm and dry. Intact. Neuro:  Not following any commands, however, seen spontaneously moving LUE against gravity to stretch.  Gen NAD. Vital signs reviewed.  Psych: Unable to assess due to aphasia  Assessment/Plan: 1. Functional deficits secondary to Left MCA infarct with R HP, Aphasia which require 3+ hours per day of interdisciplinary therapy in a comprehensive inpatient rehab setting. Physiatrist is providing close team supervision and 24 hour management of active medical problems listed below. Physiatrist and rehab team continue to assess barriers to discharge/monitor patient progress toward functional and medical goals. FIM: Function - Bathing Position: Bed Body parts bathed by patient: Abdomen, Chest Body parts bathed by helper: Right arm, Left arm, Front perineal area, Buttocks, Right upper leg, Left upper leg, Right lower leg, Left lower leg, Back Assist Level: 2 helpers  Function- Upper Body Dressing/Undressing What is the patient wearing?: Pull over shirt/dress Bra - Perfomed by helper: Thread/unthread right bra strap, Thread/unthread left bra strap, Hook/unhook bra (pull down sports bra) Pull over shirt/dress -  Perfomed by patient: Thread/unthread left sleeve, Put head through opening Pull over shirt/dress - Perfomed by helper: Pull shirt over trunk, Thread/unthread right sleeve Assist Level: 2 helpers Function - Lower Body Dressing/Undressing What is the  patient wearing?: Pants, Non-skid slipper socks Position: Bed Pants- Performed by helper: Thread/unthread right pants leg, Thread/unthread left pants leg, Pull pants up/down Non-skid slipper socks- Performed by helper: Don/doff right sock, Don/doff left sock Socks - Performed by helper: Don/doff right sock, Don/doff left sock Shoes - Performed by helper: Don/doff right shoe, Don/doff left shoe(slip on) Assist for footwear: Dependant Assist for lower body dressing: 2 Helpers  Function - Toileting Toileting activity did not occur: No continent bowel/bladder event Toileting steps completed by helper: Adjust clothing prior to toileting, Performs perineal hygiene, Adjust clothing after toileting Assist level: Two helpers  Function - Air cabin crew transfer activity did not occur: Safety/medical concerns  Function - Chair/bed transfer Chair/bed transfer activity did not occur: Safety/medical concerns Chair/bed transfer method: Other Chair/bed transfer assist level: 2 helpers Chair/bed transfer assistive device: Mechanical lift Mechanical lift: Maximove Chair/bed transfer details: Manual facilitation for weight shifting  Function - Locomotion: Oceanographer activity did not occur: Safety/medical concerns Assist Level: Dependent (Pt equals 0%)(per PT notes, transported to gym) Wheel 50 feet with 2 turns activity did not occur: Safety/medical concerns Wheel 150 feet activity did not occur: Safety/medical concerns Function - Locomotion: Ambulation Ambulation activity did not occur: Safety/medical concerns Walk 10 feet activity did not occur: Safety/medical concerns Walk 50 feet with 2 turns activity did not occur: Safety/medical  concerns Walk 150 feet activity did not occur: Safety/medical concerns Walk 10 feet on uneven surfaces activity did not occur: Safety/medical concerns  Function - Comprehension Comprehension: Auditory Comprehension assist level: Understands basic less than 25% of the time/ requires cueing >75% of the time  Function - Expression Expression: Nonverbal Expression assistive device: Other (Comment)(Nods.) Expression assist level: Expresses basis less than 25% of the time/requires cueing >75% of the time.  Function - Social Interaction Social Interaction assist level: Interacts appropriately less than 25% of the time. May be withdrawn or combative., Interacts appropriately 25 - 49% of time - Needs frequent redirection.  Function - Problem Solving Problem solving assist level: Solves basic less than 25% of the time - needs direction nearly all the time or does not effectively solve problems and may need a restraint for safety  Function - Memory Memory assist level: Recognizes or recalls less than 25% of the time/requires cueing greater than 75% of the time Patient normally able to recall (first 3 days only): None of the above  Medical Problem List and Plan: 1. Functional deficits secondary to  due to L-MCA stroke with extension   Aphasia with Right spastic hemiplegia   Cont CIR - team conf in am 2.  DVT Prophylaxis/Anticoagulation: Pharmaceutical: Lovenox 3. Pain Management: tylenol prn, knee pain RIght improving with Voltaren gel 4. Mood: LCSW to follow for evaluation when appropriate.  5. Neuropsych: This patient is not capable of making decisions on her own behalf. 6. Skin/Wound Care: Routine pressure relief measures. Maintain adequate nutritional and hydration status.  7. Fluids/Electrolytes/Nutrition: Monitor I/O.  8. T2DM: Hgb A1C- Monitor BS ac/hs and use SSI for elevated BS. Continue to titrate medications for tighter control.  CBG (last 3)  Recent Labs    06/27/17 1639  06/27/17 2052 06/28/17 0721  GLUCAP 255* 130* 92  on Lantus increase to 35U, on metformin at home, 8U novalog with meals   Am CBG in range 2/12 9. HTN: Poorly controlled due to compliance issues.  Vitals:   06/27/17 0500 06/28/17 0150  BP: 140/76 (!) 151/57  Pulse: 65 77  Resp: 16 15  Temp: 97.9  F (36.6 C) 97.7 F (36.5 C)  SpO2: 96% 93%    Started amlodipine 2/6,increase to 52m if needed   Some fluctuation cont current meds 2/12 10. Fever: resolved   off Unasyn per ID. CXR neg , repeat urine neg, no DVT,recent ECHO no evidence of endocarditis no other symptoms, ask for ID consult FUO Hep A,B,C neg, HIV neg   Appreciate ID recs, signed off.    11. Dysphagia: Continue dysphagia 1, nectar liquids with assistance at meals to help maintain adequate hydration.  12. Recent episode of  Depression: Now onCelexa last month.   13. Obesity             Body mass index is 32.91 kg/m.             Diet and exercise education             Will cont to encourage weight loss to increase endurance and promote overall health 14.  Hypokalemia    Supplemented x3 days   Potassium 3.2 on 2/11   Labs ordered for 2/13   Mag ordered 15. Leukocytosis:    WBCs 16.6 on 2/11  Afebrile   Cont to monitor   Previous workup negative   Repeat UA ordered 16. Obesity   Body mass index is 32.28 kg/m.   Diet and exercise education   Encourage weight loss to increase endurance and promote overall health 17. Hypernatremia  Na+ 153 on 2/11   Likely dehydration   IVF started on 2/11   Labs ordered for tomorrow   LOS (Days) 8 A FACE TO FACE EVALUATION WAS PERFORMED  ACharlett Blake2/04/2018, 8:22 AM

## 2017-06-28 NOTE — Progress Notes (Signed)
Patient had HS blood sugar of 57. She ate one cup of yogurt and her sugar came up to 92. She then ate one more cup of yogurt. On-call provider notified and n/o's obtained.

## 2017-06-28 NOTE — Progress Notes (Signed)
Occupational Therapy Session Note  Patient Details  Name: Sandra [name] MRN: [id number] Date of Birth: Oct 02, 1940  Today's Date: 06/28/2017 OT Individual Time: 1779-3903 OT Individual Time Calculation (min): 70 min   Short Term Goals: Week 1:  OT Short Term Goal 1 (Week 1): Pt will maintain static sitting balance for  5 min EOM/EOB with min A in prep for toileting OT Short Term Goal 2 (Week 1): Pt will locate 2/2 grooming items on R side of sink with MOD VC OT Short Term Goal 3 (Week 1): Pt will manage RUE with LUE prior to transfer with MOD VC to demonstrate R body attention OT Short Term Goal 4 (Week 1): Pt will transfer to w/c with MAX A of 1 in prep for [facility] transfer  Skilled Therapeutic Interventions/Progress Updates:    OT treatment session focused on attention, initiation, and sit<>stand. Pt incontinent of bowel and bladder upon OT arrival. Pt rolled to the R with cues to initiate roll, then able to complete with supervision. Total A +2 for peri-care and brief change. Total A to thread pant legs, then pt initiated pulling up pants by grasping waistline and attempting to bridge on L side to pull up. Rolling to finish pulling up pants. Pt came to sitting EOB with Total A to advance LE's and total A to elevate trunk. Once sitting, pt able to achieve sitting balance with overall min/mod A. Sit<>stand in stedy with total A +2, but pt able to initiate some power up. Able to clear hips to bring down seat. Total A +2 to then transfer to TIS wc 2/2 push to the R. Pt given comb and needed instructional cues initially to use object appropriately, then brought to head and brushed a few strokes. Unable to maintain attention to task for completion requiring helper assist. Incorporated music therapy with pt preferred hip hop music on pt's boom box. Pt began humming to music and seemed to be enjoyable to pt. Pt left tilted in TIS wc with safety seat belt on, R UE supported, and needs met.   Therapy  Documentation Precautions:  Precautions Precautions: Fall Precaution Comments: dense R hemiparesis, R inattention Restrictions Weight Bearing Restrictions: No Other Position/Activity Restrictions: Positioning of right upper extremity in sight of patient and not pulling on it as it is flaccid.    See Function Navigator for Current Functional Status.   Therapy/Group: Individual Therapy  Valma Cava 06/28/2017, 9:13 AM

## 2017-06-28 NOTE — Progress Notes (Signed)
Physical Therapy Session Note  Patient Details  Name: Keyuana Wank MRN: [id number] Date of Birth: December 02, 1940  Today's Date: 06/28/2017 PT Individual Time: 1300-1330 PT Individual Time Calculation (min): 30 min   Short Term Goals: Week 1:  PT Short Term Goal 1 (Week 1): Pt will demonstrate bed mobility with max assist +1 using bed features PT Short Term Goal 2 (Week 1): Pt will transfer without use of lift equipment in 25% of opportunities PT Short Term Goal 3 (Week 1): Pt will scan to R environment in 50% of observations with min cues. PT Short Term Goal 4 (Week 1): Pt will tolerate out of bed to w/c for 2 hours outside of therapy.   Skilled Therapeutic Interventions/Progress Updates:    Pt noted to have incontinent BM during rolling to prepare for OOB. Engaged in [place] to address rolling to R (hemi side) and L during hygiene and clothing management with focus on proper technique and facilitation for movement and initiation. Pt able to complete with mod assist and multimodal cues several occasions. Pt making attempts at bridging (with LLE) during clothing management. Supine <> sit retraining with facilitation at trunk and multimodal cues for sequencing. Pt able to initiate advancement of LLE off the bed and making attempts for R but assist needed due to weakness. Requires max assist overall but complete full repetition x 2 during session as pt returned to supine due to fatigue. Sitting balance EOB with min assist overall in preparation for slideboard transfer. Engaged in [place] transfer with focus on initiation of movement, muscle activation, trunk and postural control, and attention. Pt able to sustain attention to task throughout and required max assist for transfer to the R (+2 for safety and assist with slideboard placement and stabilizing w/c) with about 20% initiation by patient. Once in w/c, pt moving self in attempt to return back to the bed and able to complete transfer back to bed,  still with max assist but with improved ability to advance on slideboard and mulitmodal cues for facilitation of adequate weightshift and support for postural control. Returned to supine and scooted up in bed with +2 assist and RUE supported on pillow.   Therapy Documentation Precautions:  Precautions Precautions: Fall Precaution Comments: dense R hemiparesis, R inattention Restrictions Weight Bearing Restrictions: No Other Position/Activity Restrictions: Positioning of right upper extremity in sight of patient and not pulling on it as it is flaccid.    Pain:  Does not appear to have pain.   See Function Navigator for Current Functional Status.   Therapy/Group: Individual Therapy  Canary Brim Ivory Broad, PT, DPT  06/28/2017, 2:50 PM

## 2017-06-28 NOTE — Progress Notes (Signed)
Recreational Therapy Session Note  Patient Details  Name: Sandra [name] MRN: [id number] Date of Birth: [birth year] Today's Date: 06/28/2017  Eval deferred as pt is not yet appropriate for TR services.  Will continue to monitor through team for future participation.  [place] 06/28/2017, 4:06 PM

## 2017-06-28 NOTE — Progress Notes (Signed)
Physical Therapy Session Note  Patient Details  Name: Sandra [name] MRN: [id number] Date of Birth: 1941/01/04  Today's Date: 06/28/2017 PT Individual Time: 1100-1200 PT Individual Time Calculation (min): 60 min   Short Term Goals: Week 1:  PT Short Term Goal 1 (Week 1): Pt will demonstrate bed mobility with max assist +1 using bed features PT Short Term Goal 2 (Week 1): Pt will transfer without use of lift equipment in 25% of opportunities PT Short Term Goal 3 (Week 1): Pt will scan to R environment in 50% of observations with min cues. PT Short Term Goal 4 (Week 1): Pt will tolerate out of bed to w/c for 2 hours outside of therapy.   Skilled Therapeutic Interventions/Progress Updates:    no indication of pain based on faces scale.  Session focus on sustained attention and initiation for functional tasks.    Kinetron from w/c position at level focus on active RLE extension x3-5 reps at a time with max multimodal cues for attention and initiation.  Pt becoming extremely distracted both internally and externally as environment becoming more distracting.  Returned to room, attempted to engage pt in simple sorting task for silverware, with pt continuing to be internally distracted, and noted to be incontinent of bowel.  +2 for sit<>stand into stedy and transfer into bathroom.  Pt requires total assist +3 for toileting, and +2 for transfer back to bed.  Pt able to initiate sit>supine with min assist for RLE.  Positioned to comfort with call bell in reach and needs met.   Therapy Documentation Precautions:  Precautions Precautions: Fall Precaution Comments: dense R hemiparesis, R inattention Restrictions Weight Bearing Restrictions: No Other Position/Activity Restrictions: Positioning of right upper extremity in sight of patient and not pulling on it as it is flaccid.    See Function Navigator for Current Functional Status.   Therapy/Group: Individual Therapy  Michel Santee 06/28/2017, 2:11 PM

## 2017-06-29 ENCOUNTER — Inpatient Hospital Stay (HOSPITAL_COMMUNITY): Payer: Medicare Other | Admitting: Occupational Therapy

## 2017-06-29 ENCOUNTER — Inpatient Hospital Stay (HOSPITAL_COMMUNITY): Payer: Medicare Other | Admitting: Speech Pathology

## 2017-06-29 ENCOUNTER — Inpatient Hospital Stay (HOSPITAL_COMMUNITY): Payer: Medicare Other | Admitting: Physical Therapy

## 2017-06-29 LAB — GLUCOSE, CAPILLARY
GLUCOSE-CAPILLARY: 82 mg/dL (ref 65–99)
Glucose-Capillary: 130 mg/dL — ABNORMAL HIGH (ref 65–99)
Glucose-Capillary: 45 mg/dL — ABNORMAL LOW (ref 65–99)
Glucose-Capillary: 58 mg/dL — ABNORMAL LOW (ref 65–99)
Glucose-Capillary: 152 mg/dL — ABNORMAL HIGH (ref 65–99)
Glucose-Capillary: 95 mg/dL (ref 65–99)

## 2017-06-29 LAB — BASIC METABOLIC PANEL
Anion gap: 10 (ref 5–15)
BUN: 29 mg/dL — AB (ref 6–20)
CALCIUM: 8.8 mg/dL — AB (ref 8.9–10.3)
CO2: 28 mmol/L (ref 22–32)
Chloride: 112 mmol/L — ABNORMAL HIGH (ref 101–111)
Creatinine, Ser: 0.66 mg/dL (ref 0.44–1.00)
GFR calc Af Amer: >60 mL/min (ref >60)
Glucose, Bld: 157 mg/dL — ABNORMAL HIGH (ref 65–99)
Potassium: 3.6 mmol/L (ref 3.5–5.1)
SODIUM: 150 mmol/L — AB (ref 135–145)

## 2017-06-29 MED ORDER — GLUCOSE 40 % PO GEL
ORAL | Status: AC
  Administered 2017-06-29: 37.5 g
  Filled 2017-06-29: qty 1

## 2017-06-29 NOTE — Patient Care Conference (Signed)
Inpatient RehabilitationTeam Conference and Plan of Care Update Date: 06/29/2017   Time: 11:15 AM    Patient Name: Sandra [name]      Medical Record Number: [id number]  Date of Birth: 08/10/1940 Sex: Female         Room/Bed: 4W19C/4W19C-01 Payor Info: Payor: MEDICARE / Plan: MEDICARE PART A AND B / Product Type: *No Product type* /    Admitting Diagnosis: L CVA  Admit Date/Time:  06/20/2017  2:42 PM Admission Comments: No comment available   Primary Diagnosis:  <principal problem not specified> Principal Problem: <principal problem not specified>  Patient Active Problem List   Diagnosis Date Noted  . Hypernatremia   . Hypokalemia   . Follow-up exam   . Aphasia   . Hemiparesthesia   . Hemiparesis of right nondominant side as late effect of cerebral infarction ([place])   . Acute ischemic right MCA stroke ([place]) 06/20/2017  . Acute ischemic stroke ([place])   . Diabetes mellitus type 2 in obese ([place])   . Benign essential HTN   . Noncompliance   . FUO (fever of unknown origin)   . Leukocytosis   . Dysphagia, post-stroke   . TIA (transient ischemic attack) 06/15/2017  . Hyperlipidemia associated with type 2 diabetes mellitus ([place]) 06/14/2017  . Type 2 diabetes mellitus with neurological complications ([place]) 56/31/4970  . Stroke ([place]) 03/31/2017  . History of adenomatous polyp of colon 02/11/2017  . Dysphagia 11/19/2016  . Essential hypertension 08/12/2016  . Hemorrhoids 06/16/2016  . Rectal bleeding 06/16/2016  . Constipation 06/16/2016    Expected Discharge Date: Expected Discharge Date: 07/11/17  Team Members Present: Physician leading conference: Dr. Alysia Penna Social Worker Present: Ovidio Kin, LCSW Nurse Present: Blair Heys, RN PT Present: Dwyane Dee, PT OT Present: Cherylynn Ridges, OT SLP Present: Weston Anna, SLP PPS Coordinator present : Daiva Nakayama, RN, CRRN     Current Status/Progress Goal Weekly Team Focus  Medical   A phasic, positive UA,  blood pressures are fluctuating  Maintain medical stability, fluid electrolyte management  Check urine culture, correct electrolyte abnormalities   Bowel/Bladder   Incontinent of B&B.  Keep clean and dry.  Assess for toileting needs q shift and PRN.   Swallow/Nutrition/ Hydration   dysphagia 1 and thin liquids via [place] restricted flow cup for 5 cc bolus  min assist with least restrictive diet  completion of Modified Barium Swallow Study -no appreciable progress   ADL's   Max/total +2  Min/mod A overall  R NMR, sitting balance, transfer training, attention, initiation, following commands   Mobility   +2 for all mobility, minimal initiation, decreased attention  lofty mod/max assist  attention, visual scanning, functional mobility progressing    Communication   nonverbal, Max A to Total A to follow 1 step commands, yes/no questions  Mod A  selection of requested object from field of 2, vocalization   Safety/Cognition/ Behavioral Observations  Max A to Total A     sustained attention for ~1 minute, task initiation   Pain   No s/s pain.  <2.  Assess q shift and PRN and treat as needed.   Skin   No complications.  Maintain skin integrity.  Assess q shift and PRN.      *See Care Plan and progress notes for long and short-term goals.     Barriers to Discharge  Current Status/Progress Possible Resolutions Date Resolved   Physician    Inaccessible home environment;Medical stability;Weight  Globally aphasia, obesity, heavy assist level  Continue rehab program  Manage medical complications      Nursing                  PT                    OT                  SLP Inaccessible home environment;Decreased caregiver support;Lack of/limited family support;Medication compliance              SW                Discharge Planning/Teaching Needs:  Family wants to take home as long as daughter in-law can provide the care she requires. She is here daily and encourages her in therapies.       Team Discussion:  Goals mod level, currently plus 2 total assist. MD checking UA. Difficulty following commands-attempting to verbalize. More alert in therapies. MBS-Dys 1 thin with 5 cc cup for small sips. Unsure if family can provide amount of care pt will require upon discharge from rehab. May need to pursue NHP  Revisions to Treatment Plan:  DC 2/25 versus NHP    Continued Need for Acute Rehabilitation Level of Care: The patient requires daily medical management by a physician with specialized training in physical medicine and rehabilitation for the following conditions: Daily direction of a multidisciplinary physical rehabilitation program to ensure safe treatment while eliciting the highest outcome that is of practical value to the patient.: Yes Daily medical management of patient stability for increased activity during participation in an intensive rehabilitation regime.: Yes Daily analysis of laboratory values and/or radiology reports with any subsequent need for medication adjustment of medical intervention for : Neurological problems;Other  Zymire Turnbo, Gardiner Rhyme 06/29/2017, 1:06 PM

## 2017-06-29 NOTE — Progress Notes (Signed)
Subjective/Complaints: No issues noted by nursing.  ROS- Unable to perform due to aphasia  Objective: Vital Signs: Blood pressure 134/66, pulse 70, temperature 97.9 F (36.6 C), temperature source Oral, resp. rate 16, height 5' 5"  (1.651 m), weight 88 kg (194 lb 0.1 oz), SpO2 94 %.  Results for orders placed or performed during the hospital encounter of 06/20/17 (from the past 72 hour(s))  Glucose, capillary     Status: Abnormal   Collection Time: 06/26/17 11:26 AM  Result Value Ref Range   Glucose-Capillary 180 (H) 65 - 99 mg/dL  Glucose, capillary     Status: Abnormal   Collection Time: 06/26/17  5:02 PM  Result Value Ref Range   Glucose-Capillary 124 (H) 65 - 99 mg/dL  Glucose, capillary     Status: Abnormal   Collection Time: 06/26/17  9:02 PM  Result Value Ref Range   Glucose-Capillary 197 (H) 65 - 99 mg/dL   Comment 1 Notify RN   CBC     Status: Abnormal   Collection Time: 06/27/17  5:14 AM  Result Value Ref Range   WBC 16.6 (H) 4.0 - 10.5 K/uL   RBC 4.55 3.87 - 5.11 MIL/uL   Hemoglobin 13.0 12.0 - 15.0 g/dL   HCT 41.0 36.0 - 46.0 %   MCV 90.1 78.0 - 100.0 fL   MCH 28.6 26.0 - 34.0 pg   MCHC 31.7 30.0 - 36.0 g/dL   RDW 15.1 11.5 - 15.5 %   Platelets 370 150 - 400 K/uL    Comment: Performed at [facility], [place] [address], [place], [place] [address]  Basic metabolic panel     Status: Abnormal   Collection Time: 06/27/17  5:14 AM  Result Value Ref Range   Sodium 153 (H) 135 - 145 mmol/L   Potassium 3.2 (L) 3.5 - 5.1 mmol/L   Chloride 109 101 - 111 mmol/L   CO2 32 22 - 32 mmol/L   Glucose, Bld 139 (H) 65 - 99 mg/dL   BUN 37 (H) 6 - 20 mg/dL   Creatinine, Ser 0.78 0.44 - 1.00 mg/dL   Calcium 8.6 (L) 8.9 - 10.3 mg/dL   GFR calc non Af Amer >60 >60 mL/min   GFR calc Af Amer >60 >60 mL/min    Comment: (NOTE) The eGFR has been calculated using the CKD EPI equation. This calculation has not been validated in all clinical situations. eGFR's persistently <60  mL/min signify possible Chronic Kidney Disease.    Anion gap 12 5 - 15    Comment: Performed at [facility] [address], [place], [place] [address]  Magnesium     Status: Abnormal   Collection Time: 06/27/17  5:14 AM  Result Value Ref Range   Magnesium 2.6 (H) 1.7 - 2.4 mg/dL    Comment: Performed at [facility] [address], [place], [place] [address]  Glucose, capillary     Status: Abnormal   Collection Time: 06/27/17  6:30 AM  Result Value Ref Range   Glucose-Capillary 122 (H) 65 - 99 mg/dL   Comment 1 Notify RN   Urinalysis, Complete w Microscopic     Status: Abnormal   Collection Time: 06/27/17 10:45 AM  Result Value Ref Range   Color, Urine YELLOW YELLOW   APPearance HAZY (A) CLEAR   Specific Gravity, Urine 1.018 1.005 - 1.030   pH 5.0 5.0 - 8.0   Glucose, UA NEGATIVE NEGATIVE mg/dL   Hgb urine dipstick NEGATIVE  NEGATIVE   Bilirubin Urine NEGATIVE NEGATIVE   Ketones, ur NEGATIVE NEGATIVE mg/dL   Protein, ur NEGATIVE NEGATIVE mg/dL   Nitrite NEGATIVE NEGATIVE   Leukocytes, UA SMALL (A) NEGATIVE   RBC / HPF 0-5 0 - 5 RBC/hpf   WBC, UA 6-30 0 - 5 WBC/hpf   Bacteria, UA MANY (A) NONE SEEN   Squamous Epithelial / LPF 0-5 (A) NONE SEEN   Mucus PRESENT     Comment: Performed at [facility], [place] [address], [place], [place] [address]  Glucose, capillary     Status: Abnormal   Collection Time: 06/27/17 11:17 AM  Result Value Ref Range   Glucose-Capillary 274 (H) 65 - 99 mg/dL  Glucose, capillary     Status: Abnormal   Collection Time: 06/27/17  4:39 PM  Result Value Ref Range   Glucose-Capillary 255 (H) 65 - 99 mg/dL  Glucose, capillary     Status: Abnormal   Collection Time: 06/27/17  8:52 PM  Result Value Ref Range   Glucose-Capillary 130 (H) 65 - 99 mg/dL   Comment 1 Notify RN   Glucose, capillary     Status: None   Collection Time: 06/28/17  7:21 AM  Result Value Ref Range   Glucose-Capillary 92 65 - 99 mg/dL  Glucose, capillary      Status: Abnormal   Collection Time: 06/28/17 12:00 PM  Result Value Ref Range   Glucose-Capillary 184 (H) 65 - 99 mg/dL  Glucose, capillary     Status: None   Collection Time: 06/28/17  4:54 PM  Result Value Ref Range   Glucose-Capillary 74 65 - 99 mg/dL  Glucose, capillary     Status: Abnormal   Collection Time: 06/28/17  8:39 PM  Result Value Ref Range   Glucose-Capillary 57 (L) 65 - 99 mg/dL  Glucose, capillary     Status: None   Collection Time: 06/28/17  9:19 PM  Result Value Ref Range   Glucose-Capillary 92 65 - 99 mg/dL  Glucose, capillary     Status: None   Collection Time: 06/29/17  6:26 AM  Result Value Ref Range   Glucose-Capillary 82 65 - 99 mg/dL     HEENT: Normocephalic. Atraumatic. Cardio: RRR and no JVD.Marland Kitchen Resp: CTA B/L and Unlaboured GI: BS positive and ND Musculoskeletal:  No tenderness and No Edema Skin:   Warm and dry. Intact. Neuro:  Not following any commands, however, seen spontaneously moving LUE against gravity to stretch.  Gen NAD. Vital signs reviewed.  Psych: Unable to assess due to aphasia  Assessment/Plan: 1. Functional deficits secondary to Left MCA infarct with R HP, Aphasia which require 3+ hours per day of interdisciplinary therapy in a comprehensive inpatient rehab setting. Physiatrist is providing close team supervision and 24 hour management of active medical problems listed below. Physiatrist and rehab team continue to assess barriers to discharge/monitor patient progress toward functional and medical goals. FIM: Function - Bathing Position: Bed Body parts bathed by patient: Abdomen, Chest Body parts bathed by helper: Right arm, Left arm, Front perineal area, Buttocks, Right upper leg, Left upper leg, Right lower leg, Left lower leg, Back Assist Level: 2 helpers  Function- Upper Body Dressing/Undressing What is the patient wearing?: Pull over shirt/dress Bra - Perfomed by helper: Thread/unthread right bra strap, Thread/unthread left  bra strap, Hook/unhook bra (pull down sports bra) Pull over shirt/dress - Perfomed by patient: Thread/unthread left sleeve, Put head through opening Pull over shirt/dress - Perfomed by helper: Pull shirt over  trunk, Thread/unthread right sleeve Assist Level: 2 helpers Function - Lower Body Dressing/Undressing What is the patient wearing?: Pants, Non-skid slipper socks Position: Bed Pants- Performed by helper: Thread/unthread right pants leg, Thread/unthread left pants leg, Pull pants up/down Non-skid slipper socks- Performed by helper: Don/doff right sock, Don/doff left sock Socks - Performed by helper: Don/doff right sock, Don/doff left sock Shoes - Performed by helper: Don/doff right shoe, Don/doff left shoe(slip on) Assist for footwear: Dependant Assist for lower body dressing: 2 Helpers  Function - Toileting Toileting activity did not occur: No continent bowel/bladder event Toileting steps completed by helper: Adjust clothing prior to toileting, Performs perineal hygiene, Adjust clothing after toileting Toileting Assistive Devices: Grab bar or rail Assist level: (3 helpers)  Function - Air cabin crew transfer activity did not occur: Safety/medical concerns Toilet transfer assistive device: Facilities manager lift: Stedy Assist level to toilet: 2 helpers Assist level from toilet: 2 helpers  Function - Chair/bed transfer Chair/bed transfer activity did not occur: Safety/medical concerns Chair/bed transfer method: Lateral scoot Chair/bed transfer assist level: 2 helpers Chair/bed transfer assistive device: Sliding board Mechanical lift: Stedy Chair/bed transfer details: Manual facilitation for weight shifting  Function - Locomotion: Oceanographer activity did not occur: Safety/medical concerns Assist Level: Dependent (Pt equals 0%)(per PT notes, transported to gym) Wheel 50 feet with 2 turns activity did not occur: Safety/medical concerns Wheel 150  feet activity did not occur: Safety/medical concerns Function - Locomotion: Ambulation Ambulation activity did not occur: Safety/medical concerns Walk 10 feet activity did not occur: Safety/medical concerns Walk 50 feet with 2 turns activity did not occur: Safety/medical concerns Walk 150 feet activity did not occur: Safety/medical concerns Walk 10 feet on uneven surfaces activity did not occur: Safety/medical concerns  Function - Comprehension Comprehension: Auditory Comprehension assist level: Understands basic 25 - 49% of the time/ requires cueing 50 - 75% of the time  Function - Expression Expression: Nonverbal Expression assistive device: Other (Comment)(Nods.) Expression assist level: Expresses basis less than 25% of the time/requires cueing >75% of the time.  Function - Social Interaction Social Interaction assist level: Interacts appropriately 25 - 49% of time - Needs frequent redirection.  Function - Problem Solving Problem solving assist level: Solves basic less than 25% of the time - needs direction nearly all the time or does not effectively solve problems and may need a restraint for safety  Function - Memory Memory assist level: Recognizes or recalls less than 25% of the time/requires cueing greater than 75% of the time Patient normally able to recall (first 3 days only): None of the above  Medical Problem List and Plan: 1. Functional deficits secondary to  due to L-MCA stroke with extension   Aphasia with Right spastic hemiplegia   Cont CIR - Team conference today please see physician documentation under team conference tab, met with team face-to-face to discuss problems,progress, and goals. Formulized individual treatment plan based on medical history, underlying problem and comorbidities. 2.  DVT Prophylaxis/Anticoagulation: Pharmaceutical: Lovenox 3. Pain Management: tylenol prn, knee pain RIght improving with Voltaren gel 4. Mood: LCSW to follow for evaluation when  appropriate.  5. Neuropsych: This patient is not capable of making decisions on her own behalf. 6. Skin/Wound Care: Routine pressure relief measures. Maintain adequate nutritional and hydration status.  7. Fluids/Electrolytes/Nutrition: Monitor I/O.  8. T2DM: Hgb A1C- Monitor BS ac/hs and use SSI for elevated BS. Continue to titrate medications for tighter control.  CBG (last 3)  Recent Labs    06/28/17 2039  06/28/17 2119 06/29/17 0626  GLUCAP 57* 92 82  on Lantus increase to 35U, on metformin at home, 8U novalog with meals   Am CBG in range 2/13 9. HTN: Poorly controlled due to compliance issues.  Vitals:   06/28/17 1454 06/29/17 0500  BP: (!) 164/59 134/66  Pulse: 81 70  Resp: 16 16  Temp: 97.6 F (36.4 C) 97.9 F (36.6 C)  SpO2: 94% 94%    Started amlodipine 2/6,increase to 66m if needed   Some fluctuation cont current meds 2/13 10. Fever: resolved   off Unasyn per ID. CXR neg , repeat urine neg, no DVT,recent ECHO no evidence of endocarditis no other symptoms, ask for ID consult FUO Hep A,B,C neg, HIV neg   Appreciate ID recs, signed off.    11. Dysphagia: Continue dysphagia 1, nectar liquids with assistance at meals to help maintain adequate hydration.  12. Recent episode of  Depression: Now onCelexa last month.   13. Obesity             Body mass index is 32.91 kg/m.             Diet and exercise education             Will cont to encourage weight loss to increase endurance and promote overall health 14.  Hypokalemia    Supplemented x3 days   Potassium 3.2 on 2/11   Labs ordered for 2/13   Mag ordered 15. Leukocytosis:    WBCs 16.6 on 2/11  Afebrile   Cont to monitor   Previous workup negative   Repeat UA ordered 16. Obesity   Body mass index is 32.28 kg/m.   Diet and exercise education   Encourage weight loss to increase endurance and promote overall health 17. Hypernatremia  Na+ 153 on 2/11   Likely dehydration   IVF started on 2/11   Labs ordered for  tomorrow 18.  Positive UA low-grade temp elevated white count, no micro ordered, will do cath specimen, will check urine culture  LOS (Days) 9 A FACE TO FACE EVALUATION WAS PERFORMED  ACharlett Blake2/13/2019, 9:18 AM

## 2017-06-29 NOTE — Progress Notes (Signed)
Speech Language Pathology Daily Session Note  Patient Details  Name: Sandra [name] MRN: [id number] Date of Birth: 1940/08/21  Today's Date: 06/29/2017 SLP Individual Time: 0800-0830 SLP Individual Time Calculation (min): 30 min  Short Term Goals: Week 2: SLP Short Term Goal 1 (Week 2): Pt will answer yes/no immediate, personally relevant questions in 50% of opportunities with max assist multimodal cues.   SLP Short Term Goal 2 (Week 2): Pt will follow 1 step commands for 50% accuracy during basic, familiar tasks with max assist multimodal cues.   SLP Short Term Goal 3 (Week 2): Pt will sustain her attention to basic, familiar tasks for 1 minute intervals with max verbal cues for redirection.   SLP Short Term Goal 4 (Week 2): Pt will consume dys 1 textures and thin liquids with mod verbal cues for use of swallowing precautions and minimal overt s/s of aspiration.   SLP Short Term Goal 5 (Week 2): Pt will initiate  basic familiar task (such as self-feeding) in 50% of opportunities with Max A cues.  SLP Short Term Goal 6 (Week 2): Given Max A cues, pt will select requested object from field of 2 in 50% of opportunities.   Skilled Therapeutic Interventions:   Pt was seen for skilled ST targeting goals for dysphagia and communication.  Pt's room smelled strongly of urine upon therapist's arrival.  NT reporting pt had just been changed.  RN made aware that pt may need work up for UTI.  SLP facilitated the session with skilled observations completed during breakfast tray.  Pt declined further purees as she had already eaten the majority of her meal; however, she consumed thin liquids via Provale cup with intermittent subtle throat clearing.  Pt continues to have moderate spillage of materials from oral cavity due to decreased labial seal, requiring max assist to correct.  Pt completed automatic verbal sequencing tasks with mod-max assist for 80% accuracy.  Pt was returned to room and left in bed with  bed alarm set and with call bell within reach.  Continue per current plan of care.     Function:  Eating Eating   Modified Consistency Diet: Yes Eating Assist Level: Supervision or verbal cues;Help managing cup/glass           Cognition Comprehension Comprehension assist level: Understands basic 25 - 49% of the time/ requires cueing 50 - 75% of the time  Expression   Expression assist level: Expresses basis less than 25% of the time/requires cueing >75% of the time.  Social Interaction Social Interaction assist level: Interacts appropriately 25 - 49% of time - Needs frequent redirection.  Problem Solving Problem solving assist level: Solves basic less than 25% of the time - needs direction nearly all the time or does not effectively solve problems and may need a restraint for safety  Memory Memory assist level: Recognizes or recalls less than 25% of the time/requires cueing greater than 75% of the time    Pain Pain Assessment Pain Assessment: No/denies pain  Therapy/Group: Individual Therapy  Yesica Kemler, Selinda Orion 06/29/2017, 8:35 AM

## 2017-06-29 NOTE — Progress Notes (Signed)
Speech Language Pathology Daily Session Note  Patient Details  Name: Kalianna Verbeke MRN: [id number] Date of Birth: 03/27/1941  Today's Date: 06/29/2017 SLP Individual Time: 1140-1205 SLP Individual Time Calculation (min): 25 min  Short Term Goals: Week 2: SLP Short Term Goal 1 (Week 2): Pt will answer yes/no immediate, personally relevant questions in 50% of opportunities with max assist multimodal cues.   SLP Short Term Goal 2 (Week 2): Pt will follow 1 step commands for 50% accuracy during basic, familiar tasks with max assist multimodal cues.   SLP Short Term Goal 3 (Week 2): Pt will sustain her attention to basic, familiar tasks for 1 minute intervals with max verbal cues for redirection.   SLP Short Term Goal 4 (Week 2): Pt will consume dys 1 textures and thin liquids with mod verbal cues for use of swallowing precautions and minimal overt s/s of aspiration.   SLP Short Term Goal 5 (Week 2): Pt will initiate  basic familiar task (such as self-feeding) in 50% of opportunities with Max A cues.  SLP Short Term Goal 6 (Week 2): Given Max A cues, pt will select requested object from field of 2 in 50% of opportunities.   Skilled Therapeutic Interventions: Skilled treatment session focused on communication goals. SLP facilitated session by providing Max A multimodal cues to produce the phoneme /m/ in isolation. However, patient unable to complete verbalizations at the CV level despite Max A multimodal cues. Patient able to imitate appropriate intonation and approximate 1 out of 3 words during melodic intonation. Patient answered basic yes/no questions in regards to wants/needs with 100% accuracy. Patient left upright in bed with alarm on and all needs within reach. Continue with current plan of care.      Function:   Cognition Comprehension Comprehension assist level: Understands basic 25 - 49% of the time/ requires cueing 50 - 75% of the time  Expression   Expression assist level: Expresses  basis less than 25% of the time/requires cueing >75% of the time.  Social Interaction Social Interaction assist level: Interacts appropriately 25 - 49% of time - Needs frequent redirection.  Problem Solving Problem solving assist level: Solves basic less than 25% of the time - needs direction nearly all the time or does not effectively solve problems and may need a restraint for safety  Memory Memory assist level: Recognizes or recalls less than 25% of the time/requires cueing greater than 75% of the time    Pain No/Denies Pain   Therapy/Group: Individual Therapy  Caitlen Worth 06/29/2017, 1:25 PM

## 2017-06-29 NOTE — Progress Notes (Signed)
Hypoglycemic Event  CBG: 58   Treatment: 1 tube instant glucose and meal   Symptoms: None  Follow-up CBG: Time:1809 CBG Result: 152  Possible Reasons for Event: Inadequate meal intake  Comments/MD notified:Pamela Love notified of drop in blood sugar and follow up blood sugar. No new orders.     Kriti Katayama S

## 2017-06-29 NOTE — Progress Notes (Signed)
Physical Therapy Weekly Progress Note  Patient Details  Name: Sandra [name] MRN: [id number] Date of Birth: [birth year]  Beginning of progress report period: June 21, 2017 End of progress report period: June 29, 2017  Today's Date: 06/29/2017 PT Individual Time: 1300-1415 PT Individual Time Calculation (min): 75 min   Patient has met 4 of 4 short term goals.  Pt has made slow but steady progress this reporting period.  She continues to be limited by cognitive and language deficits.  Improved success in sessions when focus on motoric outcomes.   Patient continues to demonstrate the following deficits muscle weakness and muscle paralysis, impaired timing and sequencing, abnormal tone, unbalanced muscle activation, motor apraxia, ataxia, decreased coordination and decreased motor planning, decreased visual perceptual skills and decreased visual motor skills, decreased midline orientation, decreased attention to right and ideational apraxia, decreased initiation, decreased attention, decreased awareness, decreased problem solving, decreased safety awareness, decreased memory and delayed processing and decreased sitting balance, decreased standing balance, decreased postural control, hemiplegia and decreased balance strategies and therefore will continue to benefit from skilled PT intervention to increase functional independence with mobility.  Patient progressing toward long term goals..  Continue plan of care.  PT Short Term Goals Week 1:  PT Short Term Goal 1 (Week 1): Pt will demonstrate bed mobility with max assist +1 using bed features PT Short Term Goal 1 - Progress (Week 1): Met PT Short Term Goal 2 (Week 1): Pt will transfer without use of lift equipment in 25% of opportunities PT Short Term Goal 2 - Progress (Week 1): Met PT Short Term Goal 3 (Week 1): Pt will scan to R environment in 50% of observations with min cues. PT Short Term Goal 3 - Progress (Week 1): Met PT Short Term  Goal 4 (Week 1): Pt will tolerate out of bed to w/c for 2 hours outside of therapy.  PT Short Term Goal 4 - Progress (Week 1): Met Week 2:  PT Short Term Goal 1 (Week 2): Pt will transfer with slide board and +1 assist.   PT Short Term Goal 2 (Week 2): Pt will propel standard w/c 25' with mod assist PT Short Term Goal 3 (Week 2): Pt will transition to standing with +1 assist and LRAD  Skilled Therapeutic Interventions/Progress Updates:    no c/o pain.  Session focus on motor planning and following commands for rolling, transfers, and w/c mobility.  Pt noted to be incontinent of B/B on arrival, then incontinent of bowel x1 and bladder x2 more occasions when cleaning up.    Pt rolls to R with min tactile cues for initiation and L with mod assist multiple bouts in each direction for multiple episodes of incontinence.  Pt requires total assist for clothing management and peri-care.  Use of hoyer to transfer to standard w/c due to no +2 assist available at the time.  Taken to therapy gym total assist.  Slide board transfer w/c<>therapy mat to R and L with total>+2 assist for each with tactile cues for forward weight shift and lateral scoot.  Pt positioned in standard chair to maximize sitting tolerance.   Use of LUE for w/c propulsion and max assist from therapist to maintain straight pathway due to pt unable to reach floor with L foot for hemi-technique.  Pt positioned upright in w/c at end of session with QRB in place, call bell in reach and needs met.   Therapy Documentation Precautions:  Precautions Precautions: Fall Precaution Comments: dense R hemiparesis, R inattention  Restrictions Weight Bearing Restrictions: No Other Position/Activity Restrictions: Positioning of right upper extremity in sight of patient and not pulling on it as it is flaccid.    See Function Navigator for Current Functional Status.  Therapy/Group: Individual Therapy  Michel Santee 06/29/2017, 2:19 PM

## 2017-06-29 NOTE — Progress Notes (Signed)
Occupational Therapy Session Note  Patient Details  Name: Sandra [name] MRN: [id number] Date of Birth: [birth year]  Today's Date: 06/29/2017 OT Individual Time: 0900-1000 OT Individual Time Calculation (min): 60 min    Short Term Goals: Week 1:  OT Short Term Goal 1 (Week 1): Pt will maintain static sitting balance for  5 min EOM/EOB with min A in prep for toileting OT Short Term Goal 2 (Week 1): Pt will locate 2/2 grooming items on R side of sink with MOD VC OT Short Term Goal 3 (Week 1): Pt will manage RUE with LUE prior to transfer with MOD VC to demonstrate R body attention OT Short Term Goal 4 (Week 1): Pt will transfer to w/c with MAX A of 1 in prep for [facility] transfer  Skilled Therapeutic Interventions/Progress Updates:    Pt incontinent of bladder upon OT arrival. Bed mobility rolling R with supervision and total A to roll L. Total A for brief change and peri-care. Total A +2 to don pants. Pt transferred to sitting EOB with Max A and worked on UB dressing and sitting balance at EOB. Regular wc fitted for pt with correct cushion. Max A +2 SB transfer to regular wc. SB transfer to therapy mat with total A +2. Sit<>stand 3x using 3 muskateers technique. Facilitation to achieve hip and trunk extension and OT on R side facilitated weight bearing through LLE and L knee block 2/2 knee buckle. Incorporated mirror feedback to help achieve midline orientation. Worked on trunk extension and siting balance with reaching for cups on L side, then place on R side. Pt needed max multimodal cues initially to follow 2 step commands and place cup on  R side after reaching it, then after 4 reps, pt initiated looking to R to place cup. Pt returned to room and 1/2 lap tray placed with safety belt on and family present. Family to bring pt up to nurses station when they leave for safety.   Therapy Documentation Precautions:  Precautions Precautions: Fall Precaution Comments: dense R hemiparesis, R  inattention Restrictions Weight Bearing Restrictions: No Other Position/Activity Restrictions: Positioning of right upper extremity in sight of patient and not pulling on it as it is flaccid.   See Function Navigator for Current Functional Status.   Therapy/Group: Individual Therapy  Valma Cava 06/29/2017, 9:16 AM

## 2017-06-29 NOTE — Progress Notes (Signed)
Social Work   Celicia Minahan, Eliezer Champagne  Social Worker  Physical Medicine and Rehabilitation  Patient Care Conference  Signed  Date of Service:  06/29/2017  1:06 PM          Signed          [] Hide copied text  [] Hover for details   Inpatient RehabilitationTeam Conference and Plan of Care Update Date: 06/29/2017   Time: 11:15 AM      Patient Name: Sandra [name]      Medical Record Number: [id number]  Date of Birth: 07-04-1940 Sex: Female         Room/Bed: 4W19C/4W19C-01 Payor Info: Payor: MEDICARE / Plan: MEDICARE PART A AND B / Product Type: *No Product type* /     Admitting Diagnosis: L CVA  Admit Date/Time:  06/20/2017  2:42 PM Admission Comments: No comment available    Primary Diagnosis:  <principal problem not specified> Principal Problem: <principal problem not specified>       Patient Active Problem List    Diagnosis Date Noted  . Hypernatremia    . Hypokalemia    . Follow-up exam    . Aphasia    . Hemiparesthesia    . Hemiparesis of right nondominant side as late effect of cerebral infarction ([place])    . Acute ischemic right MCA stroke ([place]) 06/20/2017  . Acute ischemic stroke ([place])    . Diabetes mellitus type 2 in obese ([place])    . Benign essential HTN    . Noncompliance    . FUO (fever of unknown origin)    . Leukocytosis    . Dysphagia, post-stroke    . TIA (transient ischemic attack) 06/15/2017  . Hyperlipidemia associated with type 2 diabetes mellitus ([place]) 06/14/2017  . Type 2 diabetes mellitus with neurological complications ([place]) 33/82/5053  . Stroke ([place]) 03/31/2017  . History of adenomatous polyp of colon 02/11/2017  . Dysphagia 11/19/2016  . Essential hypertension 08/12/2016  . Hemorrhoids 06/16/2016  . Rectal bleeding 06/16/2016  . Constipation 06/16/2016      Expected Discharge Date: Expected Discharge Date: 07/11/17   Team Members Present: Physician leading conference: Dr. Alysia Penna Social Worker Present: Ovidio Kin,  LCSW Nurse Present: Blair Heys, RN PT Present: Dwyane Dee, PT OT Present: Cherylynn Ridges, OT SLP Present: Weston Anna, SLP PPS Coordinator present : Daiva Nakayama, RN, CRRN       Current Status/Progress Goal Weekly Team Focus  Medical     A phasic, positive UA, blood pressures are fluctuating  Maintain medical stability, fluid electrolyte management  Check urine culture, correct electrolyte abnormalities   Bowel/Bladder     Incontinent of B&B.  Keep clean and dry.  Assess for toileting needs q shift and PRN.   Swallow/Nutrition/ Hydration     dysphagia 1 and thin liquids via [place] restricted flow cup for 5 cc bolus  min assist with least restrictive diet  completion of Modified Barium Swallow Study -no appreciable progress   ADL's     Max/total +2  Min/mod A overall  R NMR, sitting balance, transfer training, attention, initiation, following commands   Mobility     +2 for all mobility, minimal initiation, decreased attention  lofty mod/max assist  attention, visual scanning, functional mobility progressing    Communication     nonverbal, Max A to Total A to follow 1 step commands, yes/no questions  Mod A  selection of requested object from field of 2, vocalization   Safety/Cognition/ Behavioral Observations   Max  A to Total A     sustained attention for ~1 minute, task initiation   Pain     No s/s pain.  <2.  Assess q shift and PRN and treat as needed.   Skin     No complications.  Maintain skin integrity.  Assess q shift and PRN.     *See Care Plan and progress notes for long and short-term goals.      Barriers to Discharge   Current Status/Progress Possible Resolutions Date Resolved   Physician     Inaccessible home environment;Medical stability;Weight  Globally aphasia, obesity, heavy assist level  Continue rehab program  Manage medical complications      Nursing                 PT                    OT                 SLP Inaccessible home  environment;Decreased caregiver support;Lack of/limited family support;Medication compliance            SW              Discharge Planning/Teaching Needs:  Family wants to take home as long as daughter in-law can provide the care she requires. She is here daily and encourages her in therapies.      Team Discussion:  Goals mod level, currently plus 2 total assist. MD checking UA. Difficulty following commands-attempting to verbalize. More alert in therapies. MBS-Dys 1 thin with 5 cc cup for small sips. Unsure if family can provide amount of care pt will require upon discharge from rehab. May need to pursue NHP  Revisions to Treatment Plan:  DC 2/25 versus NHP    Continued Need for Acute Rehabilitation Level of Care: The patient requires daily medical management by a physician with specialized training in physical medicine and rehabilitation for the following conditions: Daily direction of a multidisciplinary physical rehabilitation program to ensure safe treatment while eliciting the highest outcome that is of practical value to the patient.: Yes Daily medical management of patient stability for increased activity during participation in an intensive rehabilitation regime.: Yes Daily analysis of laboratory values and/or radiology reports with any subsequent need for medication adjustment of medical intervention for : Neurological problems;Other   Kindrick Lankford, Gardiner Rhyme 06/29/2017, 1:06 PM                  Ledford Goodson, Gardiner Rhyme, LCSW  Social Worker  Physical Medicine and Rehabilitation  Patient Care Conference  Signed  Date of Service:  06/22/2017  1:06 PM          Signed          [] Hide copied text  [] Hover for details   Inpatient RehabilitationTeam Conference and Plan of Care Update Date: 06/22/2017   Time: 11:00 Am      Patient Name: Sandra [name]      Medical Record Number: [id number]  Date of Birth: 12-30-1940 Sex: Female         Room/Bed: 4W19C/4W19C-01 Payor Info:  Payor: MEDICARE / Plan: MEDICARE PART A AND B / Product Type: *No Product type* /     Admitting Diagnosis: L CVA  Admit Date/Time:  06/20/2017  2:42 PM Admission Comments: No comment available    Primary Diagnosis:  <principal problem not specified> Principal Problem: <principal problem not specified>       Patient Active Problem List  Diagnosis Date Noted  . Acute ischemic right MCA stroke ([name]) 06/20/2017  . Acute ischemic stroke ([place])    . Diabetes mellitus type 2 in obese ([place])    . Benign essential HTN    . Noncompliance    . FUO (fever of unknown origin)    . Leukocytosis    . Dysphagia, post-stroke    . TIA (transient ischemic attack) 06/15/2017  . Hyperlipidemia associated with type 2 diabetes mellitus ([place]) 06/14/2017  . Type 2 diabetes mellitus with neurological complications ([place]) 57/32/2025  . Stroke ([place]) 03/31/2017  . History of adenomatous polyp of colon 02/11/2017  . Dysphagia 11/19/2016  . Essential hypertension 08/12/2016  . Hemorrhoids 06/16/2016  . Rectal bleeding 06/16/2016  . Constipation 06/16/2016      Expected Discharge Date:     Team Members Present: Physician leading conference: Dr. Alysia Penna Social Worker Present: Ovidio Kin, LCSW Nurse Present: Brita Romp, RN PT Present: Dwyane Dee, PT OT Present: Cherylynn Ridges, OT SLP Present: Weston Anna, SLP PPS Coordinator present : Daiva Nakayama, RN, CRRN       Current Status/Progress Goal Weekly Team Focus  Medical     Fever of unknown origin, global aphasic, D1 nectar with poor appetite  reduce aspiration risk, ensure adequate nutrition  work up of FUO   Bowel/Bladder     Incontinent of B&B.  Keep clean and dry.  Assess toileting needs q shift and PRN.   Swallow/Nutrition/ Hydration     dys 1, nectar thick liquids; full staff supervision   min assist   trials of thin liquids, strategies to contain boluses and clear oral residue    ADL's     Total A +2   min/Mod A overall   R NMR, sitting balance/tolerance, transfer training, attention, initiation    Mobility     +2 total for all mobility, mechanical lifts for transfers  lofty mod/max assist  arousal, attention, visual scanning, functional mobility as able   Communication     nonverbal, max to total assist to follow commands, answer yes/no questions  mod assist   auditory comprehension (ie yes/no questions, following commands), vocalization   Safety/Cognition/ Behavioral Observations   max to total assist   min assist   scanning to the right of midline, sustained attention to basic, familiar tasks    Pain     No s/s of pain.  <2.  Assess q shift and PRN for pain.   Skin     No complications.  Maintain skin integrity.  Assess skin q shift and PRN.     *See Care Plan and progress notes for long and short-term goals.      Barriers to Discharge   Current Status/Progress Possible Resolutions Date Resolved   Physician     Inaccessible home environment;Medical stability;Incontinence;Wound Care  +2 total A with severe global aphasia  initiating rehab program  ID consult for FUO      Nursing                 PT  Inaccessible home environment;Decreased caregiver support;Incontinence;Insurance for SNF coverage                 OT Inaccessible home environment;Decreased caregiver support;Home environment access/layout;Weight  (4 stairs to enter house with no railing)             SLP            SW Decreased caregiver support [name] is of small stature and pt is  a large woman             Discharge Planning/Teaching Needs:  Family wants to take home but daughter in-law is small and can not provide much physical assist. Son is a PT at [employer]. Await progress here.      Team Discussion:  Goals set for min/mod level of assist. Currently plus 2 total assist. Consulted ID to evaluate regarding higher white count. Checking UA again. Poor iniatiation. Incontinent B & B. Dye 1 nectar trials of thin. Poor po  intake. Maybe set discharge target date next week. Will be much care for family.  Revisions to Treatment Plan:  New eval set target discharge date next week    Continued Need for Acute Rehabilitation Level of Care: The patient requires daily medical management by a physician with specialized training in physical medicine and rehabilitation for the following conditions: Daily direction of a multidisciplinary physical rehabilitation program to ensure safe treatment while eliciting the highest outcome that is of practical value to the patient.: Yes Daily medical management of patient stability for increased activity during participation in an intensive rehabilitation regime.: Yes Daily analysis of laboratory values and/or radiology reports with any subsequent need for medication adjustment of medical intervention for : Other;Neurological problems   Elease Hashimoto 06/22/2017, 1:06 PM                 Patient ID: [name], female   DOB: 01-25-1941, 77 y.o.   MRN: [id number]

## 2017-06-30 ENCOUNTER — Inpatient Hospital Stay (HOSPITAL_COMMUNITY): Payer: Medicare Other | Admitting: Physical Therapy

## 2017-06-30 ENCOUNTER — Inpatient Hospital Stay (HOSPITAL_COMMUNITY): Payer: Medicare Other | Admitting: Occupational Therapy

## 2017-06-30 ENCOUNTER — Inpatient Hospital Stay (HOSPITAL_COMMUNITY): Payer: Medicare Other | Admitting: Speech Pathology

## 2017-06-30 DIAGNOSIS — R509 Fever, unspecified: Secondary | ICD-10-CM

## 2017-06-30 LAB — GLUCOSE, CAPILLARY
GLUCOSE-CAPILLARY: 135 mg/dL — AB (ref 65–99)
Glucose-Capillary: 74 mg/dL (ref 65–99)
Glucose-Capillary: 193 mg/dL — ABNORMAL HIGH (ref 65–99)
Glucose-Capillary: 109 mg/dL — ABNORMAL HIGH (ref 65–99)

## 2017-06-30 MED ORDER — INSULIN GLARGINE 100 UNIT/ML ~~LOC~~ SOLN
32.0000 [IU] | Freq: Every day | SUBCUTANEOUS | Status: DC
  Administered 2017-07-01 – 2017-07-03 (×3): 32 [IU] via SUBCUTANEOUS
  Filled 2017-06-30 (×4): qty 0.32

## 2017-06-30 NOTE — Progress Notes (Signed)
Speech Language Pathology Daily Session Note  Patient Details  Name: Sandra [name] MRN: [id number] Date of Birth: 1940/08/26  Today's Date: 06/30/2017 SLP Individual Time: 1400-1500 SLP Individual Time Calculation (min): 60 min  Short Term Goals: Week 2: SLP Short Term Goal 1 (Week 2): Pt will answer yes/no immediate, personally relevant questions in 50% of opportunities with max assist multimodal cues.   SLP Short Term Goal 2 (Week 2): Pt will follow 1 step commands for 50% accuracy during basic, familiar tasks with max assist multimodal cues.   SLP Short Term Goal 3 (Week 2): Pt will sustain her attention to basic, familiar tasks for 1 minute intervals with max verbal cues for redirection.   SLP Short Term Goal 4 (Week 2): Pt will consume dys 1 textures and thin liquids with mod verbal cues for use of swallowing precautions and minimal overt s/s of aspiration.   SLP Short Term Goal 5 (Week 2): Pt will initiate  basic familiar task (such as self-feeding) in 50% of opportunities with Max A cues.  SLP Short Term Goal 6 (Week 2): Given Max A cues, pt will select requested object from field of 2 in 50% of opportunities.   Skilled Therapeutic Interventions: Skilled treatment session focused on communication goals. SLP facilitated session by providing Max A multimodal cues for intonation in unison using Melodic Intonation Therapy (MIT). Pt was able to intonate her own name with 50% accuracy with faded unison intonation and responded to a probe question with 100% accuracy. However, pt unable to alternate to another functional phrase using MIT. Patient answered basic yes/no questions in regards to wants/needs with 100% accuracy. Pt spontaneously produced automatic verbal responses such as "okay"x1 and "bye"x1. Patient left upright in bed with alarm on and all needs within reach. Continue with current plan of care.    Function:  Cognition Comprehension Comprehension assist level: Understands basic  25 - 49% of the time/ requires cueing 50 - 75% of the time  Expression   Expression assist level: Expresses basis less than 25% of the time/requires cueing >75% of the time.  Social Interaction Social Interaction assist level: Interacts appropriately 25 - 49% of time - Needs frequent redirection.  Problem Solving Problem solving assist level: Solves basic less than 25% of the time - needs direction nearly all the time or does not effectively solve problems and may need a restraint for safety  Memory Memory assist level: Recognizes or recalls less than 25% of the time/requires cueing greater than 75% of the time    Pain Pain Assessment Pain Assessment: No/denies pain  Therapy/Group: Individual Therapy  Meredeth Ide  SLP - Student 06/30/2017, 3:38 PM

## 2017-06-30 NOTE — Progress Notes (Signed)
Occupational Therapy Weekly Progress Note  Patient Details  Name: Sandra [name] MRN: [id number] Date of Birth: [birth year]  Beginning of progress report period: June 21, 2017 End of progress report period: June 30, 2017  Today's Date: 06/30/2017 OT Individual Time: 1015-1130 OT Individual Time Calculation (min): 75 min    Patient has met 2 of 4 short term goals.  Pt is making slow progress towards OT goals at this time. Pt has progressed with transfers requiring total A + 1 for SB transfers bed>wc. She has not been consistent with transfers however and still requires +2 at times. Sitting balance with BADL tasks has improved to overall min A, but can progress to Mod A with increased task demand of ADL. Pt is more consistently following one-step commands does better within familiar ADL task. Continue current POC.  Patient continues to demonstrate the following deficits: muscle weakness, impaired timing and sequencing, abnormal tone, unbalanced muscle activation, motor apraxia, decreased coordination and decreased motor planning, decreased midline orientation, decreased attention to right, right side neglect and decreased motor planning, decreased initiation, decreased attention, decreased awareness, decreased problem solving, decreased safety awareness, decreased memory and delayed processing and decreased sitting balance, decreased standing balance, decreased postural control, hemiplegia and decreased balance strategies and therefore will continue to benefit from skilled OT intervention to enhance overall performance with BADL and Reduce care partner burden.  Patient progressing toward long term goals..  Continue plan of care.  OT Short Term Goals Week 1:  OT Short Term Goal 1 (Week 1): Pt will maintain static sitting balance for  5 min EOM/EOB with min A in prep for toileting OT Short Term Goal 1 - Progress (Week 1): Progressing toward goal OT Short Term Goal 2 (Week 1): Pt will locate  2/2 grooming items on R side of sink with MOD VC OT Short Term Goal 2 - Progress (Week 1): Met OT Short Term Goal 3 (Week 1): Pt will manage RUE with LUE prior to transfer with MOD VC to demonstrate R body attention OT Short Term Goal 3 - Progress (Week 1): Met OT Short Term Goal 4 (Week 1): Pt will transfer to w/c with MAX A of 1 in prep for BSC transfer OT Short Term Goal 4 - Progress (Week 1): Progressing toward goal Week 2:  OT Short Term Goal 1 (Week 2): Pt will transfer to w/c with MAX A of 1 in prep for [facility] transfer OT Short Term Goal 2 (Week 2): Pt will maintain static sitting balance for  5 min EOM/EOB with min A in prep for toileting OT Short Term Goal 3 (Week 2): Pt will consistently follow 2 step commands with mod cues within BADL task  Skilled Therapeutic Interventions/Progress Updates:      Pt greeted sitting in wc, brought to dayroom and worked on making a valentines day card. Tried to engage pt in copying her name using L hand, but pt only able to draw scribble marks. Provided hand over hadn A to guide pt through name writing. Had pt locate stickers on R side of table to place onto valentines day card. Total A SB transfer to therapy mat. Worked on sitting balance and trunk extension to the L to decrease lateral lean to the R. Worked on [employer] puzzle with pt initially needing hand over hand A to sort correctly, then able to place 5 cubes correctly. Sit<>stand in standing frame using straps under bottom with Max A +2. Pt with difficulty powering up and needed facilitation  to achieve full hip/trunk extension. Pt tolerated 2 mins standing for neur re-ed. Pt returned to room with total A of 1 for SB transfer back to bed. Pt left with bed alarm on and needs met.   Therapy Documentation Precautions:  Precautions Precautions: Fall Precaution Comments: dense R hemiparesis, R inattention Restrictions Weight Bearing Restrictions: No Other Position/Activity Restrictions: Positioning  of right upper extremity in sight of patient and not pulling on it as it is flaccid.   See Function Navigator for Current Functional Status.   Therapy/Group: Individual Therapy  Valma Cava 06/30/2017, 11:31 AM

## 2017-06-30 NOTE — Progress Notes (Signed)
Physical Therapy Session Note  Patient Details  Name: Sandra [name] MRN: [id number] Date of Birth: [birth year]  Today's Date: 06/30/2017 PT Individual Time: 0915-1023 PT Individual Time Calculation (min): 68 min   Short Term Goals: Week 2:  PT Short Term Goal 1 (Week 2): Pt will transfer with slide board and +1 assist.   PT Short Term Goal 2 (Week 2): Pt will propel standard w/c 25' with mod assist PT Short Term Goal 3 (Week 2): Pt will transition to standing with +1 assist and LRAD  Skilled Therapeutic Interventions/Progress Updates:    no c/o pain.  Session focus on following commands, bed mobility, and transfer.    Pt requires max assist for bathing at bed level with hand over hand to initiate and max multimodal cues to bathe ~25% of her body.  Able to attend to task ~30 second intervals.  Noted to be incontinent of smear BM, placed bedpan with supervision to roll to the R, (-) void/BM.  Pt able to roll L for placing brief with min assist to complete roll.  PT threaded BLEs into pants, and pt able to initiate lifting RLE.  Bridging to pull pants up with total assist.  Pt donned shirt in supported sitting with total assist.  Supine>sit with HOB maximally raised with mod assist for RLE and to bring trunk to midline.  Slide board transfer to w/c on L with max assist, multimodal cues for sequencing.  Pt positioned in w/c.  Therapist added theraband to L drive wheel to improve pt's ability to self propel.  Max assist for w/c propulsion today.  Returned to room and positioned upright in w/c with QRB in place, call bell in reach and needs met.   Therapy Documentation Precautions:  Precautions Precautions: Fall Precaution Comments: dense R hemiparesis, R inattention Restrictions Weight Bearing Restrictions: No Other Position/Activity Restrictions: Positioning of right upper extremity in sight of patient and not pulling on it as it is flaccid.    See Function Navigator for Current Functional  Status.   Therapy/Group: Individual Therapy  Michel Santee 06/30/2017, 9:48 AM

## 2017-06-30 NOTE — Progress Notes (Signed)
Subjective/Complaints: Dropped CBG yesterday pm, poor intake at lunch (25%)  ROS- Unable to perform due to aphasia  Objective: Vital Signs: Blood pressure (!) 152/62, pulse 68, temperature 98 F (36.7 C), temperature source Axillary, resp. rate 16, height 5' 5"  (1.651 m), weight 88.2 kg (194 lb 7.1 oz), SpO2 99 %.  Results for orders placed or performed during the hospital encounter of 06/20/17 (from the past 72 hour(s))  Urinalysis, Complete w Microscopic     Status: Abnormal   Collection Time: 06/27/17 10:45 AM  Result Value Ref Range   Color, Urine YELLOW YELLOW   APPearance HAZY (A) CLEAR   Specific Gravity, Urine 1.018 1.005 - 1.030   pH 5.0 5.0 - 8.0   Glucose, UA NEGATIVE NEGATIVE mg/dL   Hgb urine dipstick NEGATIVE NEGATIVE   Bilirubin Urine NEGATIVE NEGATIVE   Ketones, ur NEGATIVE NEGATIVE mg/dL   Protein, ur NEGATIVE NEGATIVE mg/dL   Nitrite NEGATIVE NEGATIVE   Leukocytes, UA SMALL (A) NEGATIVE   RBC / HPF 0-5 0 - 5 RBC/hpf   WBC, UA 6-30 0 - 5 WBC/hpf   Bacteria, UA MANY (A) NONE SEEN   Squamous Epithelial / LPF 0-5 (A) NONE SEEN   Mucus PRESENT     Comment: Performed at [facility], [address] [place], [place] [address]  Glucose, capillary     Status: Abnormal   Collection Time: 06/27/17 11:17 AM  Result Value Ref Range   Glucose-Capillary 274 (H) 65 - 99 mg/dL  Glucose, capillary     Status: Abnormal   Collection Time: 06/27/17  4:39 PM  Result Value Ref Range   Glucose-Capillary 255 (H) 65 - 99 mg/dL  Glucose, capillary     Status: Abnormal   Collection Time: 06/27/17  8:52 PM  Result Value Ref Range   Glucose-Capillary 130 (H) 65 - 99 mg/dL   Comment 1 Notify RN   Glucose, capillary     Status: None   Collection Time: 06/28/17  7:21 AM  Result Value Ref Range   Glucose-Capillary 92 65 - 99 mg/dL  Glucose, capillary     Status: Abnormal   Collection Time: 06/28/17 12:00 PM  Result Value Ref Range   Glucose-Capillary 184 (H) 65 - 99 mg/dL   Glucose, capillary     Status: None   Collection Time: 06/28/17  4:54 PM  Result Value Ref Range   Glucose-Capillary 74 65 - 99 mg/dL  Glucose, capillary     Status: Abnormal   Collection Time: 06/28/17  8:39 PM  Result Value Ref Range   Glucose-Capillary 57 (L) 65 - 99 mg/dL  Glucose, capillary     Status: None   Collection Time: 06/28/17  9:19 PM  Result Value Ref Range   Glucose-Capillary 92 65 - 99 mg/dL  Glucose, capillary     Status: None   Collection Time: 06/29/17  6:26 AM  Result Value Ref Range   Glucose-Capillary 82 65 - 99 mg/dL  Basic metabolic panel     Status: Abnormal   Collection Time: 06/29/17 10:50 AM  Result Value Ref Range   Sodium 150 (H) 135 - 145 mmol/L   Potassium 3.6 3.5 - 5.1 mmol/L   Chloride 112 (H) 101 - 111 mmol/L   CO2 28 22 - 32 mmol/L   Glucose, Bld 157 (H) 65 - 99 mg/dL   BUN 29 (H) 6 - 20 mg/dL   Creatinine, Ser 0.66 0.44 - 1.00 mg/dL   Calcium 8.8 (L) 8.9 -  10.3 mg/dL   GFR calc non Af Amer >60 >60 mL/min   GFR calc Af Amer >60 >60 mL/min    Comment: (NOTE) The eGFR has been calculated using the CKD EPI equation. This calculation has not been validated in all clinical situations. eGFR's persistently <60 mL/min signify possible Chronic Kidney Disease.    Anion gap 10 5 - 15    Comment: Performed at [facility] [address], [place], [place] [address]  Glucose, capillary     Status: Abnormal   Collection Time: 06/29/17 12:01 PM  Result Value Ref Range   Glucose-Capillary 130 (H) 65 - 99 mg/dL  Glucose, capillary     Status: Abnormal   Collection Time: 06/29/17  4:46 PM  Result Value Ref Range   Glucose-Capillary 58 (L) 65 - 99 mg/dL  Glucose, capillary     Status: Abnormal   Collection Time: 06/29/17  5:13 PM  Result Value Ref Range   Glucose-Capillary 45 (L) 65 - 99 mg/dL  Glucose, capillary     Status: Abnormal   Collection Time: 06/29/17  6:09 PM  Result Value Ref Range   Glucose-Capillary 152 (H) 65 - 99 mg/dL   Glucose, capillary     Status: None   Collection Time: 06/29/17  9:29 PM  Result Value Ref Range   Glucose-Capillary 95 65 - 99 mg/dL  Glucose, capillary     Status: None   Collection Time: 06/30/17  6:23 AM  Result Value Ref Range   Glucose-Capillary 74 65 - 99 mg/dL     HEENT: Normocephalic. Atraumatic. Cardio: RRR and no JVD.Marland Kitchen Resp: CTA B/L and Unlaboured GI: BS positive and ND Musculoskeletal:  No tenderness and No Edema Skin:   Warm and dry. Intact. Neuro:  Not following any commands, however, seen spontaneously moving LUE against gravity to stretch.  Gen NAD. Vital signs reviewed.  Psych: Unable to assess due to aphasia  Assessment/Plan: 1. Functional deficits secondary to Left MCA infarct with R HP, Aphasia which require 3+ hours per day of interdisciplinary therapy in a comprehensive inpatient rehab setting. Physiatrist is providing close team supervision and 24 hour management of active medical problems listed below. Physiatrist and rehab team continue to assess barriers to discharge/monitor patient progress toward functional and medical goals. FIM: Function - Bathing Position: Bed Body parts bathed by patient: Abdomen, Chest Body parts bathed by helper: Right arm, Left arm, Front perineal area, Buttocks, Right upper leg, Left upper leg, Right lower leg, Left lower leg, Back Assist Level: 2 helpers  Function- Upper Body Dressing/Undressing What is the patient wearing?: Pull over shirt/dress Bra - Perfomed by helper: Thread/unthread right bra strap, Thread/unthread left bra strap, Hook/unhook bra (pull down sports bra) Pull over shirt/dress - Perfomed by patient: Thread/unthread left sleeve, Put head through opening Pull over shirt/dress - Perfomed by helper: Pull shirt over trunk, Thread/unthread right sleeve Assist Level: 2 helpers Function - Lower Body Dressing/Undressing What is the patient wearing?: Pants, Non-skid slipper socks Position: Bed Pants- Performed  by helper: Thread/unthread right pants leg, Thread/unthread left pants leg, Pull pants up/down Non-skid slipper socks- Performed by helper: Don/doff right sock, Don/doff left sock Socks - Performed by helper: Don/doff right sock, Don/doff left sock Shoes - Performed by helper: Don/doff right shoe, Don/doff left shoe(slip on) Assist for footwear: Dependant Assist for lower body dressing: 2 Helpers  Function - Toileting Toileting activity did not occur: No continent bowel/bladder event Toileting steps completed by helper: Adjust clothing prior to toileting,  Performs perineal hygiene, Adjust clothing after toileting Toileting Assistive Devices: Grab bar or rail Assist level: (3 helpers)  Function - Air cabin crew transfer activity did not occur: Safety/medical concerns Toilet transfer assistive device: Facilities manager lift: [place] Assist level to toilet: 2 helpers Assist level from toilet: 2 helpers  Function - Chair/bed transfer Chair/bed transfer activity did not occur: Safety/medical concerns Chair/bed transfer method: Lateral scoot Chair/bed transfer assist level: 2 helpers Chair/bed transfer assistive device: Sliding board Mechanical lift: [place] transfer details: Manual facilitation for weight shifting  Function - Locomotion: Oceanographer activity did not occur: Safety/medical concerns Assist Level: Dependent (Pt equals 0%)(per PT notes, transported to gym) Wheel 50 feet with 2 turns activity did not occur: Safety/medical concerns Wheel 150 feet activity did not occur: Safety/medical concerns Function - Locomotion: Ambulation Ambulation activity did not occur: Safety/medical concerns Walk 10 feet activity did not occur: Safety/medical concerns Walk 50 feet with 2 turns activity did not occur: Safety/medical concerns Walk 150 feet activity did not occur: Safety/medical concerns Walk 10 feet on uneven surfaces activity did not occur:  Safety/medical concerns  Function - Comprehension Comprehension: Auditory Comprehension assist level: Understands basic 25 - 49% of the time/ requires cueing 50 - 75% of the time  Function - Expression Expression: Nonverbal Expression assistive device: Other (Comment)(Nods.) Expression assist level: Expresses basis less than 25% of the time/requires cueing >75% of the time.  Function - Social Interaction Social Interaction assist level: Interacts appropriately 25 - 49% of time - Needs frequent redirection.  Function - Problem Solving Problem solving assist level: Solves basic less than 25% of the time - needs direction nearly all the time or does not effectively solve problems and may need a restraint for safety  Function - Memory Memory assist level: Recognizes or recalls less than 25% of the time/requires cueing greater than 75% of the time Patient normally able to recall (first 3 days only): None of the above  Medical Problem List and Plan: 1. Functional deficits secondary to  due to L-MCA stroke with extension   Aphasia with Right spastic hemiplegia   Cont CIR - PT, OT, SLP 2.  DVT Prophylaxis/Anticoagulation: Pharmaceutical: Lovenox 3. Pain Management: tylenol prn, knee pain RIght improving with Voltaren gel 4. Mood: LCSW to follow for evaluation when appropriate.  5. Neuropsych: This patient is not capable of making decisions on her own behalf. 6. Skin/Wound Care: Routine pressure relief measures. Maintain adequate nutritional and hydration status.  7. Fluids/Electrolytes/Nutrition: Monitor I/O.  8. T2DM: Hgb A1C- Monitor BS ac/hs and use SSI for elevated BS. Continue to titrate medications for tighter control.  CBG (last 3)  Recent Labs    06/29/17 1809 06/29/17 2129 06/30/17 0623  GLUCAP 152* 95 74  on Lantus increase to 35U, on metformin at home, 8U novalog with meals   Am CBG in range 2/14 but on low side will reduce Lantus, pm low due to poor intake at lunch 9.  HTN: Poorly controlled due to compliance issues.  Vitals:   06/29/17 1434 06/30/17 0016  BP: 133/75 (!) 152/62  Pulse: 79 68  Resp: 18 16  Temp: 98.7 F (37.1 C) 98 F (36.7 C)  SpO2: 98% 99%    Started amlodipine 2/6,increase to 42m if needed   Some fluctuation cont current meds 2/14 10. Fever: resolved   off Unasyn per ID. CXR neg , repeat urine neg, no DVT,recent ECHO no evidence of endocarditis no other symptoms, ask for ID consult FUO Hep  A,B,C neg, HIV neg   Appreciate ID recs, signed off.    11. Dysphagia: Continue dysphagia 1, nectar liquids with assistance at meals to help maintain adequate hydration.  12. Recent episode of  Depression: Now onCelexa last month.   13. Obesity             Body mass index is 32.91 kg/m.             Diet and exercise education             Will cont to encourage weight loss to increase endurance and promote overall health 14.  Hypokalemia    Supplemented x3 days   Potassium 3.2 on 2/11   Improved to 3.6 2/13   Mag ordered 15. Leukocytosis:    WBCs 16.6 on 2/11  Afebrile   Cont to monitor   Previous workup negative   Repeat UA ordered 16. Obesity   Body mass index is 32.36 kg/m.   Diet and exercise education   Encourage weight loss to increase endurance and promote overall health 17. Hypernatremia  Na+ 153 on 2/11   Likely dehydration   IVF started on 2/11   Improved to 150 2/13 18.  Positive UA low-grade temp elevated white count, no micro ordered, will do cath specimen, will check urine culture-result pnd  LOS (Days) 10 A FACE TO FACE EVALUATION WAS PERFORMED  Charlett Blake 06/30/2017, 8:05 AM

## 2017-07-01 ENCOUNTER — Inpatient Hospital Stay (HOSPITAL_COMMUNITY): Payer: Medicare Other | Admitting: Speech Pathology

## 2017-07-01 ENCOUNTER — Inpatient Hospital Stay (HOSPITAL_COMMUNITY): Payer: Medicare Other

## 2017-07-01 ENCOUNTER — Inpatient Hospital Stay (HOSPITAL_COMMUNITY): Payer: Medicare Other | Admitting: Occupational Therapy

## 2017-07-01 LAB — URINE CULTURE

## 2017-07-01 LAB — GLUCOSE, CAPILLARY
GLUCOSE-CAPILLARY: 96 mg/dL (ref 65–99)
GLUCOSE-CAPILLARY: 40 mg/dL — AB (ref 65–99)
GLUCOSE-CAPILLARY: 42 mg/dL — AB (ref 65–99)
GLUCOSE-CAPILLARY: 150 mg/dL — AB (ref 65–99)
GLUCOSE-CAPILLARY: 139 mg/dL — AB (ref 65–99)
Glucose-Capillary: 85 mg/dL (ref 65–99)

## 2017-07-01 MED ORDER — CEPHALEXIN 250 MG PO CAPS
250.0000 mg | ORAL_CAPSULE | Freq: Three times a day (TID) | ORAL | Status: DC
  Administered 2017-07-01 – 2017-07-02 (×3): 250 mg via ORAL
  Filled 2017-07-01 (×3): qty 1

## 2017-07-01 MED ORDER — GLUCOSE 40 % PO GEL
ORAL | Status: AC
  Administered 2017-07-01: 75 g
  Filled 2017-07-01: qty 1

## 2017-07-01 NOTE — Progress Notes (Signed)
Occupational Therapy Session Note  Patient Details  Name: Sandra [name] MRN: [id number] Date of Birth: 1941-03-28  Today's Date: 07/01/2017 OT Individual Time: 1000-1100 OT Individual Time Calculation (min): 60 min    Short Term Goals: Week 2:  OT Short Term Goal 1 (Week 2): Pt will transfer to w/c with MAX A of 1 in prep for [facility] transfer OT Short Term Goal 2 (Week 2): Pt will maintain static sitting balance for  5 min EOM/EOB with min A in prep for toileting OT Short Term Goal 3 (Week 2): Pt will consistently follow 2 step commands with mod cues within BADL task  Skilled Therapeutic Interventions/Progress Updates:    Pt greeted semi-reclined in bed noted to be incontinent of bowel and bladder. Pt with improved bed mobility for rolling to the R with supervision and min/mod A to roll to the L. Total A for peri-care and brief change. Total A for LB dressing with rolling to pull up pants. Worked on sitting balance and UB dressing seated EOB with pt able to initiate 2/4 steps. SB transfer to R with total A +2 today. Pt completed 3 sit<>stands in standing frame with total A to facilitate weight bearing through L UE and LLE for neuro re-ed. Pt left seated in wc with safety belt on and lap tray in place.   Therapy Documentation Precautions:  Precautions Precautions: Fall Precaution Comments: dense R hemiparesis, R inattention Restrictions Weight Bearing Restrictions: No Other Position/Activity Restrictions: Positioning of right upper extremity in sight of patient and not pulling on it as it is flaccid.  Pain: Pain Assessment Pain Assessment: Faces Pain Score: 0-No pain Faces Pain Scale: No hurt  See Function Navigator for Current Functional Status.   Therapy/Group: Individual Therapy  Valma Cava 07/01/2017, 11:02 AM

## 2017-07-01 NOTE — Progress Notes (Signed)
Physical Therapy Session Note  Patient Details  Name: Sandra [name] MRN: [id number] Date of Birth: Jul 08, 1940  Today's Date: 07/01/2017 PT Individual Time: 1884-1660 PT Individual Time Calculation (min): 58 min   Short Term Goals: Week 2:  PT Short Term Goal 1 (Week 2): Pt will transfer with slide board and +1 assist.   PT Short Term Goal 2 (Week 2): Pt will propel standard w/c 25' with mod assist PT Short Term Goal 3 (Week 2): Pt will transition to standing with +1 assist and LRAD  Skilled Therapeutic Interventions/Progress Updates:    Family present during session to assist and encourage patient. Daughter providing assist for rolling (physical and verbal cues) and donning of pants using hemi-technique. Pt required min assist for rolling. sidelying to sit with max assist with multimodal cues for facilitation of movement at trunk and functional use of LUE to assist with pushing up from propped on elbow to sitting and pt wanting to lay herself back down in the bed. NMR to address postural control, sitting balance, and muscle activation during functional slideboard transfers in/out of bed with minimal effort put forth by patient (having assist pt with transfer before, pt was able to assist more in prior session) and appears at times to work against therapist. W/c mobility training using hemi technique but pt unable to focus on both UE and LE movement, so focused on attention just to utilizing LUE for repetitive movement pattern requiring max cues and hand over hand assist for placement. NMR in standing frame to address BLE and forced RUE weightbearing, postural control, and address R attention x 7.5 min and then x 3 min while attempting to engage pt in functional reaching task to promote weightbearing, reorientation to midline, and attention to the R. Pt with incontinent BM while in standing frame, so returned to room via total assist and transferred via slideboard as described above. Notified nursing  staff to assist with hygiene.   Therapy Documentation Precautions:  Precautions Precautions: Fall Precaution Comments: dense R hemiparesis, R inattention Restrictions Weight Bearing Restrictions: No Other Position/Activity Restrictions: Positioning of right upper extremity in sight of patient and not pulling on it as it is flaccid.   Pain: Does not appear to have pain.   See Function Navigator for Current Functional Status.   Therapy/Group: Individual Therapy  Canary Brim Ivory Broad, PT, DPT  07/01/2017, 2:11 PM

## 2017-07-01 NOTE — Progress Notes (Signed)
Speech Language Pathology Daily Session Note  Patient Details  Name: Sandra [name] MRN: [id number] Date of Birth: 18-Feb-1941  Today's Date: 07/01/2017 SLP Individual Time: 1130-1200 SLP Individual Time Calculation (min): 30 min  Short Term Goals: Week 2: SLP Short Term Goal 1 (Week 2): Pt will answer yes/no immediate, personally relevant questions in 50% of opportunities with max assist multimodal cues.   SLP Short Term Goal 2 (Week 2): Pt will follow 1 step commands for 50% accuracy during basic, familiar tasks with max assist multimodal cues.   SLP Short Term Goal 3 (Week 2): Pt will sustain her attention to basic, familiar tasks for 1 minute intervals with max verbal cues for redirection.   SLP Short Term Goal 4 (Week 2): Pt will consume dys 1 textures and thin liquids with mod verbal cues for use of swallowing precautions and minimal overt s/s of aspiration.   SLP Short Term Goal 5 (Week 2): Pt will initiate  basic familiar task (such as self-feeding) in 50% of opportunities with Max A cues.  SLP Short Term Goal 6 (Week 2): Given Max A cues, pt will select requested object from field of 2 in 50% of opportunities.    Skilled Therapeutic Interventions: Skilled treatment session focused on communication goals. SLP facilitated session by providing Max A multimodal cues for intonation in unison using Melodic Intonation Therapy (MIT). Pt was able to intonate her own name x1 with faded unison intonation. Patient answered basic yes/no questions in regards to wants/needs with 100% accuracy. Pt demonstrated decreased sustained attention to task today, suspect due to discomfort from wheelchair. At the end of session, pt transferred back to bed with Total +2 Assist via the [facility]. Pt left supine in bed with RN present. Continue with current plan of care.     Function:  Cognition Comprehension Comprehension assist level: Understands basic 25 - 49% of the time/ requires cueing 50 - 75% of the time   Expression   Expression assist level: Expresses basis less than 25% of the time/requires cueing >75% of the time.  Social Interaction Social Interaction assist level: Interacts appropriately 25 - 49% of time - Needs frequent redirection.  Problem Solving Problem solving assist level: Solves basic less than 25% of the time - needs direction nearly all the time or does not effectively solve problems and may need a restraint for safety  Memory Memory assist level: Recognizes or recalls less than 25% of the time/requires cueing greater than 75% of the time    Pain Pain Assessment Pain Location: Hip Pain Intervention(s): RN made aware;Repositioned  Therapy/Group: Individual Therapy  Meredeth Ide  SLP - Student 07/01/2017, 12:40 PM

## 2017-07-01 NOTE — Progress Notes (Signed)
Speech Language Pathology Daily Session Note  Patient Details  Name: Sandra [name] MRN: [id number] Date of Birth: 1941/03/06  Today's Date: 07/01/2017 SLP Individual Time: 0915-1000 SLP Individual Time Calculation (min): 45 min  Short Term Goals: Week 2: SLP Short Term Goal 1 (Week 2): Pt will answer yes/no immediate, personally relevant questions in 50% of opportunities with max assist multimodal cues.   SLP Short Term Goal 2 (Week 2): Pt will follow 1 step commands for 50% accuracy during basic, familiar tasks with max assist multimodal cues.   SLP Short Term Goal 3 (Week 2): Pt will sustain her attention to basic, familiar tasks for 1 minute intervals with max verbal cues for redirection.   SLP Short Term Goal 4 (Week 2): Pt will consume dys 1 textures and thin liquids with mod verbal cues for use of swallowing precautions and minimal overt s/s of aspiration.   SLP Short Term Goal 5 (Week 2): Pt will initiate  basic familiar task (such as self-feeding) in 50% of opportunities with Max A cues.  SLP Short Term Goal 6 (Week 2): Given Max A cues, pt will select requested object from field of 2 in 50% of opportunities.   Skilled Therapeutic Interventions: Skilled treatment session focused on communication goals consisting of object selection, singing in unison and yes/no questions and cues for sustained attention. SLP facilitated session by providing providing Max A cues to sing in unison "Happy Birthday and ABCs." Pt with with vocalization for entire songs and patting hand to beat. Pt able to select requested object in field of 2 with Max A cues faded slightly to Mod A cues with 75% accuracy. Pt appeared to imitate name of objects x 4. Pt initiated visual task with verbal cues. She answered "yes" x 1 and nodded head "yes" x 1 with direct question. Pt able to sustain attention to task for ~ 5 minutes at time before gaze and attention appeared to shift. Rest break with water via [place] cup and then  pt able to re-engage in task. Pt left upright in bed with nursing present. Continue per current plan of care.      Function:  Eating Eating                 Cognition Comprehension Comprehension assist level: Understands basic 25 - 49% of the time/ requires cueing 50 - 75% of the time  Expression   Expression assist level: Expresses basis less than 25% of the time/requires cueing >75% of the time.  Social Interaction Social Interaction assist level: Interacts appropriately 25 - 49% of time - Needs frequent redirection.  Problem Solving Problem solving assist level: Solves basic less than 25% of the time - needs direction nearly all the time or does not effectively solve problems and may need a restraint for safety  Memory Memory assist level: Recognizes or recalls less than 25% of the time/requires cueing greater than 75% of the time    Pain Pain Assessment Pain Assessment: Faces Pain Score: 0-No pain Faces Pain Scale: No hurt  Therapy/Group: Individual Therapy  Hero Kulish 07/01/2017, 10:53 AM

## 2017-07-01 NOTE — Progress Notes (Signed)
Social Work Patient ID: Sandra [name], female   DOB: 21-Jan-1941, 77 y.o.   MRN: [id number]  Spoke with daughter in-law and pt's son via telephone to inform team conference progress this week and goals of mod/max level of assist. Pt's care would be too much for daughter in-law and son who is a PT at [employer]. Both realize the best option is for pt to go to a NH to get more rehab then hopefully go home. Son would like the [facility] since he works at the hospital and their home is walking distance from the there. Will begin working on the plan.

## 2017-07-01 NOTE — Significant Event (Signed)
To feed dinner then recheck CBG

## 2017-07-01 NOTE — Progress Notes (Signed)
Subjective/Complaints: Patient remains aphasic  ROS- Unable to perform due to aphasia  Objective: Vital Signs: Blood pressure (!) 134/92, pulse 74, temperature 99 F (37.2 C), temperature source Axillary, resp. rate 16, height 5' 5"  (1.651 m), weight 88 kg (194 lb 0.1 oz), SpO2 94 %.  Results for orders placed or performed during the hospital encounter of 06/20/17 (from the past 72 hour(s))  Glucose, capillary     Status: None   Collection Time: 06/28/17  4:54 PM  Result Value Ref Range   Glucose-Capillary 74 65 - 99 mg/dL  Glucose, capillary     Status: Abnormal   Collection Time: 06/28/17  8:39 PM  Result Value Ref Range   Glucose-Capillary 57 (L) 65 - 99 mg/dL  Glucose, capillary     Status: None   Collection Time: 06/28/17  9:19 PM  Result Value Ref Range   Glucose-Capillary 92 65 - 99 mg/dL  Glucose, capillary     Status: None   Collection Time: 06/29/17  6:26 AM  Result Value Ref Range   Glucose-Capillary 82 65 - 99 mg/dL  Basic metabolic panel     Status: Abnormal   Collection Time: 06/29/17 10:50 AM  Result Value Ref Range   Sodium 150 (H) 135 - 145 mmol/L   Potassium 3.6 3.5 - 5.1 mmol/L   Chloride 112 (H) 101 - 111 mmol/L   CO2 28 22 - 32 mmol/L   Glucose, Bld 157 (H) 65 - 99 mg/dL   BUN 29 (H) 6 - 20 mg/dL   Creatinine, Ser 0.66 0.44 - 1.00 mg/dL   Calcium 8.8 (L) 8.9 - 10.3 mg/dL   GFR calc non Af Amer >60 >60 mL/min   GFR calc Af Amer >60 >60 mL/min    Comment: (NOTE) The eGFR has been calculated using the CKD EPI equation. This calculation has not been validated in all clinical situations. eGFR's persistently <60 mL/min signify possible Chronic Kidney Disease.    Anion gap 10 5 - 15    Comment: Performed at [facility] [address] [place], [place] [address]  Glucose, capillary     Status: Abnormal   Collection Time: 06/29/17 12:01 PM  Result Value Ref Range   Glucose-Capillary 130 (H) 65 - 99 mg/dL  Glucose, capillary     Status:  Abnormal   Collection Time: 06/29/17  4:46 PM  Result Value Ref Range   Glucose-Capillary 58 (L) 65 - 99 mg/dL  Glucose, capillary     Status: Abnormal   Collection Time: 06/29/17  5:13 PM  Result Value Ref Range   Glucose-Capillary 45 (L) 65 - 99 mg/dL  Glucose, capillary     Status: Abnormal   Collection Time: 06/29/17  6:09 PM  Result Value Ref Range   Glucose-Capillary 152 (H) 65 - 99 mg/dL  Glucose, capillary     Status: None   Collection Time: 06/29/17  9:29 PM  Result Value Ref Range   Glucose-Capillary 95 65 - 99 mg/dL  Glucose, capillary     Status: None   Collection Time: 06/30/17  6:23 AM  Result Value Ref Range   Glucose-Capillary 74 65 - 99 mg/dL  Glucose, capillary     Status: Abnormal   Collection Time: 06/30/17 11:31 AM  Result Value Ref Range   Glucose-Capillary 193 (H) 65 - 99 mg/dL  Glucose, capillary     Status: Abnormal   Collection Time: 06/30/17  4:42 PM  Result Value Ref Range   Glucose-Capillary 109 (H)  65 - 99 mg/dL  Glucose, capillary     Status: Abnormal   Collection Time: 06/30/17  9:31 PM  Result Value Ref Range   Glucose-Capillary 135 (H) 65 - 99 mg/dL  Glucose, capillary     Status: None   Collection Time: 07/01/17  6:12 AM  Result Value Ref Range   Glucose-Capillary 85 65 - 99 mg/dL  Glucose, capillary     Status: None   Collection Time: 07/01/17 11:31 AM  Result Value Ref Range   Glucose-Capillary 96 65 - 99 mg/dL     HEENT: Normocephalic. Atraumatic. Cardio: RRR and no JVD.Marland Kitchen Resp: CTA B/L and Unlaboured GI: BS positive and ND Musculoskeletal:  No tenderness and No Edema Skin:   Warm and dry. Intact. Neuro:  Not following any commands, however, seen spontaneously moving LUE against gravity to stretch.  Gen NAD. Vital signs reviewed.  Psych: Unable to assess due to aphasia  Assessment/Plan: 1. Functional deficits secondary to Left MCA infarct with R HP, Aphasia which require 3+ hours per day of interdisciplinary therapy in a  comprehensive inpatient rehab setting. Physiatrist is providing close team supervision and 24 hour management of active medical problems listed below. Physiatrist and rehab team continue to assess barriers to discharge/monitor patient progress toward functional and medical goals. FIM: Function - Bathing Position: Bed Body parts bathed by patient: Chest, Abdomen, Left upper leg Body parts bathed by helper: Right arm, Left arm, Front perineal area, Buttocks, Right upper leg, Right lower leg, Left lower leg, Back Assist Level: (max assist)  Function- Upper Body Dressing/Undressing What is the patient wearing?: Pull over shirt/dress Bra - Perfomed by helper: Thread/unthread right bra strap, Thread/unthread left bra strap, Hook/unhook bra (pull down sports bra) Pull over shirt/dress - Perfomed by patient: Thread/unthread left sleeve, Put head through opening Pull over shirt/dress - Perfomed by helper: Pull shirt over trunk, Thread/unthread right sleeve Assist Level: 2 helpers Function - Lower Body Dressing/Undressing What is the patient wearing?: Pants, Non-skid slipper socks Position: Bed Pants- Performed by helper: Thread/unthread right pants leg, Thread/unthread left pants leg, Pull pants up/down Non-skid slipper socks- Performed by helper: Don/doff right sock, Don/doff left sock Socks - Performed by helper: Don/doff right sock, Don/doff left sock Shoes - Performed by helper: Don/doff right shoe, Don/doff left shoe(slip on) Assist for footwear: Dependant Assist for lower body dressing: 2 Helpers  Function - Toileting Toileting activity did not occur: No continent bowel/bladder event Toileting steps completed by helper: Adjust clothing prior to toileting, Performs perineal hygiene, Adjust clothing after toileting Toileting Assistive Devices: Grab bar or rail Assist level: (3 helpers)  Function - Air cabin crew transfer activity did not occur: Safety/medical concerns Toilet  transfer assistive device: Facilities manager lift: Stedy Assist level to toilet: 2 helpers Assist level from toilet: 2 helpers  Function - Chair/bed transfer Chair/bed transfer activity did not occur: Safety/medical concerns Chair/bed transfer method: Lateral scoot Chair/bed transfer assist level: 2 helpers Chair/bed transfer assistive device: Armrests, Sliding board Mechanical lift: Maximove Chair/bed transfer details: Manual facilitation for weight shifting  Function - Locomotion: Oceanographer activity did not occur: Safety/medical concerns Assist Level: Maximal assistance (Pt 25 - 49%) Wheel 50 feet with 2 turns activity did not occur: Safety/medical concerns Assist Level: Maximal assistance (Pt 25 - 49%) Wheel 150 feet activity did not occur: Safety/medical concerns Function - Locomotion: Ambulation Ambulation activity did not occur: Safety/medical concerns Walk 10 feet activity did not occur: Safety/medical concerns Walk 50 feet with 2 turns activity  did not occur: Safety/medical concerns Walk 150 feet activity did not occur: Safety/medical concerns Walk 10 feet on uneven surfaces activity did not occur: Safety/medical concerns  Function - Comprehension Comprehension: Auditory Comprehension assist level: Understands basic 25 - 49% of the time/ requires cueing 50 - 75% of the time  Function - Expression Expression: Verbal, Nonverbal Expression assistive device: Other (Comment)(Nods.) Expression assist level: Expresses basis less than 25% of the time/requires cueing >75% of the time.  Function - Social Interaction Social Interaction assist level: Interacts appropriately 25 - 49% of time - Needs frequent redirection.  Function - Problem Solving Problem solving assist level: Solves basic less than 25% of the time - needs direction nearly all the time or does not effectively solve problems and may need a restraint for safety  Function - Memory Memory assist  level: Recognizes or recalls less than 25% of the time/requires cueing greater than 75% of the time Patient normally able to recall (first 3 days only): None of the above  Medical Problem List and Plan: 1. Functional deficits secondary to  due to L-MCA stroke with extension   Aphasia with Right spastic hemiplegia   Cont CIR - PT, OT, SLP 2.  DVT Prophylaxis/Anticoagulation: Pharmaceutical: Lovenox 3. Pain Management: tylenol prn, knee pain RIght improving with Voltaren gel 4. Mood: LCSW to follow for evaluation when appropriate.  5. Neuropsych: This patient is not capable of making decisions on her own behalf. 6. Skin/Wound Care: Routine pressure relief measures. Maintain adequate nutritional and hydration status.  7. Fluids/Electrolytes/Nutrition: Monitor I/O.  8. T2DM: Hgb A1C- Monitor BS ac/hs and use SSI for elevated BS. Continue to titrate medications for tighter control.  CBG (last 3)  Recent Labs    06/30/17 2131 07/01/17 0612 07/01/17 1131  GLUCAP 135* 85 96  on Lantus increase to 35U, on metformin at home, 8U novalog with meals   Am CBG in range 2/14 but on low side improved after reduction of Lantus 9. HTN: Poorly controlled due to compliance issues.  Vitals:   06/30/17 1405 07/01/17 0000  BP: 133/63 (!) 134/92  Pulse: 78 74  Resp: 16 16  Temp: 98 F (36.7 C) 99 F (37.2 C)  SpO2: 92% 94%    Started amlodipine 2/6,increase to 69m if needed   Some fluctuation cont current meds 2/15 10. Fever: resolved   off Unasyn per ID. CXR neg , repeat urine neg, no DVT,recent ECHO no evidence of endocarditis no other symptoms, ask for ID consult FUO Hep A,B,C neg, HIV neg   Appreciate ID recs, signed off.    11. Dysphagia: Continue dysphagia 1, nectar liquids with assistance at meals to help maintain adequate hydration.  12. Recent episode of  Depression: Now onCelexa last month.   13. Obesity             Body mass index is 32.91 kg/m.             Diet and exercise education              Will cont to encourage weight loss to increase endurance and promote overall health 14.  Hypokalemia    Supplemented x3 days   Potassium 3.2 on 2/11   Improved to 3.6 2/13   Mag ordered 15. Leukocytosis:    WBCs 16.6 on 2/11  Afebrile   Cont to monitor   Previous workup negative   Repeat UA ordered 16. Obesity   Body mass index is 32.28 kg/m.   Diet and  exercise education   Encourage weight loss to increase endurance and promote overall health 17. Hypernatremia  Na+ 153 on 2/11   Likely dehydration   IVF started on 2/11   Improved to 150 2/13 18.  Positive UA low-grade temp elevated white count, no micro ordered, will do cath specimen, greater than 100,000 E. coli on urine culture-start empiric Keflex  LOS (Days) 11 A FACE TO FACE EVALUATION WAS PERFORMED  Charlett Blake 07/01/2017, 2:13 PM

## 2017-07-01 NOTE — Significant Event (Signed)
Hypoglycemic Event  CBG:40  Treatment: 75 Gms of Glucose gel  Symptoms: weakness  Follow-up CBG: Time:1700 CBG Result:*42  Possible Reasons for Event: disease process,   Comments/MD notified: [place]anger PA notified    Raelyn Mora

## 2017-07-01 NOTE — Progress Notes (Signed)
IV in upper left arm infiltrated. IV D/C'd and new IV started by IV team member in right forearm.

## 2017-07-01 NOTE — Progress Notes (Signed)
CBG 150 after glucose gel and dinner

## 2017-07-02 ENCOUNTER — Inpatient Hospital Stay (HOSPITAL_COMMUNITY): Payer: Medicare Other | Admitting: Occupational Therapy

## 2017-07-02 DIAGNOSIS — A499 Bacterial infection, unspecified: Secondary | ICD-10-CM

## 2017-07-02 DIAGNOSIS — N390 Urinary tract infection, site not specified: Secondary | ICD-10-CM

## 2017-07-02 LAB — GLUCOSE, CAPILLARY
GLUCOSE-CAPILLARY: 149 mg/dL — AB (ref 65–99)
GLUCOSE-CAPILLARY: 260 mg/dL — AB (ref 65–99)
GLUCOSE-CAPILLARY: 98 mg/dL (ref 65–99)
Glucose-Capillary: 109 mg/dL — ABNORMAL HIGH (ref 65–99)

## 2017-07-02 MED ORDER — SULFAMETHOXAZOLE-TRIMETHOPRIM 400-80 MG PO TABS
1.0000 | ORAL_TABLET | Freq: Two times a day (BID) | ORAL | Status: AC
  Administered 2017-07-02 – 2017-07-08 (×14): 1 via ORAL
  Filled 2017-07-02 (×14): qty 1

## 2017-07-02 NOTE — Progress Notes (Signed)
Subjective/Complaints: Patient appears comfortable.  No problems overnight per nursing.  ROS: limited due to language/communication   Objective: Vital Signs: Blood pressure (!) 147/72, pulse 71, temperature (!) 97.4 F (36.3 C), temperature source Oral, resp. rate 12, height 5' 5"  (1.651 m), weight 88 kg (194 lb 0.1 oz), SpO2 93 %.  Results for orders placed or performed during the hospital encounter of 06/20/17 (from the past 72 hour(s))  Basic metabolic panel     Status: Abnormal   Collection Time: 06/29/17 10:50 AM  Result Value Ref Range   Sodium 150 (H) 135 - 145 mmol/L   Potassium 3.6 3.5 - 5.1 mmol/L   Chloride 112 (H) 101 - 111 mmol/L   CO2 28 22 - 32 mmol/L   Glucose, Bld 157 (H) 65 - 99 mg/dL   BUN 29 (H) 6 - 20 mg/dL   Creatinine, Ser 0.66 0.44 - 1.00 mg/dL   Calcium 8.8 (L) 8.9 - 10.3 mg/dL   GFR calc non Af Amer >60 >60 mL/min   GFR calc Af Amer >60 >60 mL/min    Comment: (NOTE) The eGFR has been calculated using the CKD EPI equation. This calculation has not been validated in all clinical situations. eGFR's persistently <60 mL/min signify possible Chronic Kidney Disease.    Anion gap 10 5 - 15    Comment: Performed at [facility] [address] [place], [place] [address]  Glucose, capillary     Status: Abnormal   Collection Time: 06/29/17 12:01 PM  Result Value Ref Range   Glucose-Capillary 130 (H) 65 - 99 mg/dL  Glucose, capillary     Status: Abnormal   Collection Time: 06/29/17  4:46 PM  Result Value Ref Range   Glucose-Capillary 58 (L) 65 - 99 mg/dL  Glucose, capillary     Status: Abnormal   Collection Time: 06/29/17  5:13 PM  Result Value Ref Range   Glucose-Capillary 45 (L) 65 - 99 mg/dL  Glucose, capillary     Status: Abnormal   Collection Time: 06/29/17  6:09 PM  Result Value Ref Range   Glucose-Capillary 152 (H) 65 - 99 mg/dL  Glucose, capillary     Status: None   Collection Time: 06/29/17  9:29 PM  Result Value Ref Range    Glucose-Capillary 95 65 - 99 mg/dL  Glucose, capillary     Status: None   Collection Time: 06/30/17  6:23 AM  Result Value Ref Range   Glucose-Capillary 74 65 - 99 mg/dL  Glucose, capillary     Status: Abnormal   Collection Time: 06/30/17 11:31 AM  Result Value Ref Range   Glucose-Capillary 193 (H) 65 - 99 mg/dL  Glucose, capillary     Status: Abnormal   Collection Time: 06/30/17  4:42 PM  Result Value Ref Range   Glucose-Capillary 109 (H) 65 - 99 mg/dL  Glucose, capillary     Status: Abnormal   Collection Time: 06/30/17  9:31 PM  Result Value Ref Range   Glucose-Capillary 135 (H) 65 - 99 mg/dL  Glucose, capillary     Status: None   Collection Time: 07/01/17  6:12 AM  Result Value Ref Range   Glucose-Capillary 85 65 - 99 mg/dL  Glucose, capillary     Status: None   Collection Time: 07/01/17 11:31 AM  Result Value Ref Range   Glucose-Capillary 96 65 - 99 mg/dL  Glucose, capillary     Status: Abnormal   Collection Time: 07/01/17  4:38 PM  Result Value Ref  Range   Glucose-Capillary 40 (LL) 65 - 99 mg/dL  Glucose, capillary     Status: Abnormal   Collection Time: 07/01/17  5:05 PM  Result Value Ref Range   Glucose-Capillary 42 (LL) 65 - 99 mg/dL  Glucose, capillary     Status: Abnormal   Collection Time: 07/01/17  5:37 PM  Result Value Ref Range   Glucose-Capillary 150 (H) 65 - 99 mg/dL  Glucose, capillary     Status: Abnormal   Collection Time: 07/01/17  8:54 PM  Result Value Ref Range   Glucose-Capillary 139 (H) 65 - 99 mg/dL  Glucose, capillary     Status: Abnormal   Collection Time: 07/02/17  7:07 AM  Result Value Ref Range   Glucose-Capillary 109 (H) 65 - 99 mg/dL     HEENT: Normocephalic. Atraumatic. Cardio: RRR without murmur. No JVD .Marland Kitchen Resp: CTA Bilaterally without wheezes or rales. Normal effort  GI: BS positive and ND Musculoskeletal:  No tenderness and No Edema Skin:   Warm and dry. Intact. Neuro:  Awake, does not follow commands consistently Gen NAD.  Vital signs reviewed.  Psych: Unable to assess due to aphasia  Assessment/Plan: 1. Functional deficits secondary to Left MCA infarct with R HP, Aphasia which require 3+ hours per day of interdisciplinary therapy in a comprehensive inpatient rehab setting. Physiatrist is providing close team supervision and 24 hour management of active medical problems listed below. Physiatrist and rehab team continue to assess barriers to discharge/monitor patient progress toward functional and medical goals. FIM: Function - Bathing Position: Bed Body parts bathed by patient: Chest, Abdomen, Left upper leg Body parts bathed by helper: Right arm, Left arm, Front perineal area, Buttocks, Right upper leg, Right lower leg, Left lower leg, Back Assist Level: (max assist)  Function- Upper Body Dressing/Undressing What is the patient wearing?: Pull over shirt/dress Bra - Perfomed by helper: Thread/unthread right bra strap, Thread/unthread left bra strap, Hook/unhook bra (pull down sports bra) Pull over shirt/dress - Perfomed by patient: Thread/unthread left sleeve, Put head through opening Pull over shirt/dress - Perfomed by helper: Pull shirt over trunk, Thread/unthread right sleeve Assist Level: 2 helpers Function - Lower Body Dressing/Undressing What is the patient wearing?: Pants, Non-skid slipper socks Position: Bed Pants- Performed by helper: Thread/unthread right pants leg, Thread/unthread left pants leg, Pull pants up/down Non-skid slipper socks- Performed by helper: Don/doff right sock, Don/doff left sock Socks - Performed by helper: Don/doff right sock, Don/doff left sock Shoes - Performed by helper: Don/doff right shoe, Don/doff left shoe(slip on) Assist for footwear: Dependant Assist for lower body dressing: 2 Helpers  Function - Toileting Toileting activity did not occur: No continent bowel/bladder event Toileting steps completed by helper: Adjust clothing prior to toileting, Performs perineal  hygiene, Adjust clothing after toileting Toileting Assistive Devices: Grab bar or rail Assist level: (3 helpers)  Function - Air cabin crew transfer activity did not occur: Safety/medical concerns Toilet transfer assistive device: Facilities manager lift: Stedy Assist level to toilet: 2 helpers Assist level from toilet: 2 helpers  Function - Chair/bed transfer Chair/bed transfer activity did not occur: Safety/medical concerns Chair/bed transfer method: Lateral scoot Chair/bed transfer assist level: 2 helpers Chair/bed transfer assistive device: Armrests, Sliding board Mechanical lift: Maximove Chair/bed transfer details: Manual facilitation for weight shifting  Function - Locomotion: Oceanographer activity did not occur: Safety/medical concerns Assist Level: Maximal assistance (Pt 25 - 49%) Wheel 50 feet with 2 turns activity did not occur: Safety/medical concerns Assist Level: Maximal assistance (Pt  25 - 49%) Wheel 150 feet activity did not occur: Safety/medical concerns Function - Locomotion: Ambulation Ambulation activity did not occur: Safety/medical concerns Walk 10 feet activity did not occur: Safety/medical concerns Walk 50 feet with 2 turns activity did not occur: Safety/medical concerns Walk 150 feet activity did not occur: Safety/medical concerns Walk 10 feet on uneven surfaces activity did not occur: Safety/medical concerns  Function - Comprehension Comprehension: Auditory Comprehension assist level: Understands basic 25 - 49% of the time/ requires cueing 50 - 75% of the time  Function - Expression Expression: Verbal, Nonverbal Expression assistive device: Other (Comment)(Nods.) Expression assist level: Expresses basis less than 25% of the time/requires cueing >75% of the time.  Function - Social Interaction Social Interaction assist level: Interacts appropriately 25 - 49% of time - Needs frequent redirection.  Function - Problem  Solving Problem solving assist level: Solves basic less than 25% of the time - needs direction nearly all the time or does not effectively solve problems and may need a restraint for safety  Function - Memory Memory assist level: Recognizes or recalls less than 25% of the time/requires cueing greater than 75% of the time Patient normally able to recall (first 3 days only): None of the above  Medical Problem List and Plan: 1. Functional deficits secondary to  due to L-MCA stroke with extension   Aphasia with Right spastic hemiplegia   Cont CIR - PT, OT, SLP 2.  DVT Prophylaxis/Anticoagulation: Pharmaceutical: Lovenox 3. Pain Management: tylenol prn, knee pain RIght improving with Voltaren gel 4. Mood: LCSW to follow for evaluation when appropriate.  5. Neuropsych: This patient is not capable of making decisions on her own behalf. 6. Skin/Wound Care: Routine pressure relief measures. Maintain adequate nutritional and hydration status.  7. Fluids/Electrolytes/Nutrition: Monitor I/O.  8. T2DM: Hgb A1C- Monitor BS ac/hs and use SSI for elevated BS. Continue to titrate medications for tighter control.  CBG (last 3)  Recent Labs    07/01/17 1737 07/01/17 2054 07/02/17 0707  GLUCAP 150* 139* 109*    Lantus adjusted to 32 units. 8U novalog with meals   Sugars with improvement over the last 24 hours.  9. HTN: Improved control 2/16 Vitals:   07/01/17 1439 07/02/17 0445  BP: (!) 111/53 (!) 147/72  Pulse: 79 71  Resp: 16 12  Temp: 97.6 F (36.4 C) (!) 97.4 F (36.3 C)  SpO2: 90% 93%    Started amlodipine 2/6,increase to 76m if needed     10. Fever: resolved   off Unasyn per ID. CXR neg , repeat urine neg, no DVT,recent ECHO no evidence of endocarditis no other symptoms, ask for ID consult FUO Hep A,B,C neg, HIV neg   Appreciate ID recs, signed off.    11. Dysphagia: Continue dysphagia 1, nectar liquids with assistance at meals to help maintain adequate hydration.  12. Recent episode  of  Depression: Now onCelexa last month.   13. Obesity             Body mass index is 32.91 kg/m.             Diet and exercise education             Will cont to encourage weight loss to increase endurance and promote overall health 14.  Hypokalemia    Supplemented x3 days   Potassium 3.2 on 2/11   Improved to 3.6 2/13   Mag ordered 15. Leukocytosis:    WBCs 16.6 on 2/11  Afebrile   Cont  to monitor   Previous workup negative   Repeat UA ordered 16. Obesity   Body mass index is 32.28 kg/m.   Diet and exercise education   Encourage weight loss to increase endurance and promote overall health 17. Hypernatremia  Na+ 153 on 2/11   Likely dehydration   IVF started on 2/11   Improved to 150 2/13 18.  Positive UA low-grade temp elevated white count, no micro ordered, will do cath specimen, greater than 100,000 E. coli which is resistant to Keflex.  Begin Bactrim twice daily for 7 days 07/02/2017  LOS (Days) 12 A FACE TO FACE EVALUATION WAS PERFORMED  Laurelin Elson T 07/02/2017, 8:32 AM

## 2017-07-03 LAB — GLUCOSE, CAPILLARY
GLUCOSE-CAPILLARY: 75 mg/dL (ref 65–99)
Glucose-Capillary: 106 mg/dL — ABNORMAL HIGH (ref 65–99)
Glucose-Capillary: 134 mg/dL — ABNORMAL HIGH (ref 65–99)
Glucose-Capillary: 196 mg/dL — ABNORMAL HIGH (ref 65–99)

## 2017-07-03 NOTE — Progress Notes (Signed)
Subjective/Complaints: Patient resting in bed.  She awoke easily and did not indicate any issues.  ROS: limited due to language/communication   Objective: Vital Signs: Blood pressure 136/63, pulse 71, temperature 97.9 F (36.6 C), temperature source Oral, resp. rate 18, height 5\' 5"  (1.651 m), weight 88 kg (194 lb 0.1 oz), SpO2 94 %.  Results for orders placed or performed during the hospital encounter of 06/20/17 (from the past 72 hour(s))  Glucose, capillary     Status: Abnormal   Collection Time: 06/30/17 11:31 AM  Result Value Ref Range   Glucose-Capillary 193 (H) 65 - 99 mg/dL  Glucose, capillary     Status: Abnormal   Collection Time: 06/30/17  4:42 PM  Result Value Ref Range   Glucose-Capillary 109 (H) 65 - 99 mg/dL  Glucose, capillary     Status: Abnormal   Collection Time: 06/30/17  9:31 PM  Result Value Ref Range   Glucose-Capillary 135 (H) 65 - 99 mg/dL  Glucose, capillary     Status: None   Collection Time: 07/01/17  6:12 AM  Result Value Ref Range   Glucose-Capillary 85 65 - 99 mg/dL  Glucose, capillary     Status: None   Collection Time: 07/01/17 11:31 AM  Result Value Ref Range   Glucose-Capillary 96 65 - 99 mg/dL  Glucose, capillary     Status: Abnormal   Collection Time: 07/01/17  4:38 PM  Result Value Ref Range   Glucose-Capillary 40 (LL) 65 - 99 mg/dL  Glucose, capillary     Status: Abnormal   Collection Time: 07/01/17  5:05 PM  Result Value Ref Range   Glucose-Capillary 42 (LL) 65 - 99 mg/dL  Glucose, capillary     Status: Abnormal   Collection Time: 07/01/17  5:37 PM  Result Value Ref Range   Glucose-Capillary 150 (H) 65 - 99 mg/dL  Glucose, capillary     Status: Abnormal   Collection Time: 07/01/17  8:54 PM  Result Value Ref Range   Glucose-Capillary 139 (H) 65 - 99 mg/dL  Glucose, capillary     Status: Abnormal   Collection Time: 07/02/17  7:07 AM  Result Value Ref Range   Glucose-Capillary 109 (H) 65 - 99 mg/dL  Glucose, capillary      Status: Abnormal   Collection Time: 07/02/17 11:38 AM  Result Value Ref Range   Glucose-Capillary 149 (H) 65 - 99 mg/dL  Glucose, capillary     Status: Abnormal   Collection Time: 07/02/17  4:45 PM  Result Value Ref Range   Glucose-Capillary 260 (H) 65 - 99 mg/dL  Glucose, capillary     Status: None   Collection Time: 07/02/17  9:03 PM  Result Value Ref Range   Glucose-Capillary 98 65 - 99 mg/dL  Glucose, capillary     Status: None   Collection Time: 07/03/17  6:55 AM  Result Value Ref Range   Glucose-Capillary 75 65 - 99 mg/dL     HEENT: Normocephalic. Atraumatic. Cardio: RRR without murmur. No JVD .Marland Kitchen Resp: CTA Bilaterally without wheezes or rales. Normal effort   GI: BS positive and ND Musculoskeletal:  No tenderness and No Edema Skin:   Warm and dry. Intact. Neuro:  Awake, does not follow commands consistently Gen NAD. Vital signs reviewed.  Psych: Limited due to a aphasia but appears to be fairly upbeat  Assessment/Plan: 1. Functional deficits secondary to Left MCA infarct with R HP, Aphasia which require 3+ hours per day of interdisciplinary therapy in a comprehensive inpatient  rehab setting. Physiatrist is providing close team supervision and 24 hour management of active medical problems listed below. Physiatrist and rehab team continue to assess barriers to discharge/monitor patient progress toward functional and medical goals. FIM: Function - Bathing Position: Bed Body parts bathed by patient: Chest, Abdomen, Left upper leg Body parts bathed by helper: Right arm, Left arm, Front perineal area, Buttocks, Right upper leg, Right lower leg, Left lower leg, Back Assist Level: (max assist)  Function- Upper Body Dressing/Undressing What is the patient wearing?: Pull over shirt/dress Bra - Perfomed by helper: Thread/unthread right bra strap, Thread/unthread left bra strap, Hook/unhook bra (pull down sports bra) Pull over shirt/dress - Perfomed by patient: Thread/unthread  left sleeve, Put head through opening Pull over shirt/dress - Perfomed by helper: Pull shirt over trunk, Thread/unthread right sleeve Assist Level: 2 helpers Function - Lower Body Dressing/Undressing What is the patient wearing?: Pants, Non-skid slipper socks Position: Bed Pants- Performed by helper: Thread/unthread right pants leg, Thread/unthread left pants leg, Pull pants up/down Non-skid slipper socks- Performed by helper: Don/doff right sock, Don/doff left sock Socks - Performed by helper: Don/doff right sock, Don/doff left sock Shoes - Performed by helper: Don/doff right shoe, Don/doff left shoe(slip on) Assist for footwear: Dependant Assist for lower body dressing: 2 Helpers  Function - Toileting Toileting activity did not occur: No continent bowel/bladder event Toileting steps completed by helper: Adjust clothing prior to toileting, Performs perineal hygiene, Adjust clothing after toileting Toileting Assistive Devices: Grab bar or rail Assist level: Touching or steadying assistance (Pt.75%)  Function - Air cabin crew transfer activity did not occur: Safety/medical concerns Toilet transfer assistive device: Facilities manager lift: Stedy Assist level to toilet: 2 helpers Assist level from toilet: 2 helpers  Function - Chair/bed transfer Chair/bed transfer activity did not occur: Safety/medical concerns Chair/bed transfer method: Lateral scoot Chair/bed transfer assist level: 2 helpers Chair/bed transfer assistive device: Armrests, Sliding board Mechanical lift: Maximove Chair/bed transfer details: Manual facilitation for weight shifting  Function - Locomotion: Oceanographer activity did not occur: Safety/medical concerns Assist Level: Maximal assistance (Pt 25 - 49%) Wheel 50 feet with 2 turns activity did not occur: Safety/medical concerns Assist Level: Maximal assistance (Pt 25 - 49%) Wheel 150 feet activity did not occur: Safety/medical  concerns Function - Locomotion: Ambulation Ambulation activity did not occur: Safety/medical concerns Walk 10 feet activity did not occur: Safety/medical concerns Walk 50 feet with 2 turns activity did not occur: Safety/medical concerns Walk 150 feet activity did not occur: Safety/medical concerns Walk 10 feet on uneven surfaces activity did not occur: Safety/medical concerns  Function - Comprehension Comprehension: Auditory Comprehension assist level: Understands basic 25 - 49% of the time/ requires cueing 50 - 75% of the time  Function - Expression Expression: Verbal Expression assistive device: Other (Comment)(Nods.) Expression assist level: Expresses basis less than 25% of the time/requires cueing >75% of the time.  Function - Social Interaction Social Interaction assist level: Interacts appropriately less than 25% of the time. May be withdrawn or combative.  Function - Problem Solving Problem solving assist level: Solves basic less than 25% of the time - needs direction nearly all the time or does not effectively solve problems and may need a restraint for safety  Function - Memory Memory assist level: Recognizes or recalls less than 25% of the time/requires cueing greater than 75% of the time Patient normally able to recall (first 3 days only): None of the above  Medical Problem List and Plan: 1. Functional deficits secondary  to  due to [facility] stroke with extension   Aphasia with Right spastic hemiplegia   Cont CIR - PT, OT, SLP 2.  DVT Prophylaxis/Anticoagulation: Pharmaceutical: Lovenox 3. Pain Management: tylenol prn, knee pain RIght improving with Voltaren gel 4. Mood: LCSW to follow for evaluation when appropriate.  5. Neuropsych: This patient is not capable of making decisions on her own behalf. 6. Skin/Wound Care: Routine pressure relief measures. Maintain adequate nutritional and hydration status.  7. Fluids/Electrolytes/Nutrition: Monitor I/O.  8. T2DM: Hgb A1C-  Monitor BS ac/hs and use SSI for elevated BS. Continue to titrate medications for tighter control.  CBG (last 3)  Recent Labs    07/02/17 1645 07/02/17 2103 07/03/17 0655  GLUCAP 260* 98 75    Lantus adjusted to 32 units. 8U novalog with meals   Sugars with improvement over the last 24-48 hours but will need further titration.  9. HTN: Improved control 2/16 Vitals:   07/02/17 1444 07/03/17 0000  BP: (!) 132/52 136/63  Pulse: 74 71  Resp: 18 18  Temp: (!) 97.5 F (36.4 C) 97.9 F (36.6 C)  SpO2: 94% 94%    Started amlodipine 2/6,increase to 10mg  if needed     10. Fever: resolved   off Unasyn per ID. CXR neg , repeat urine neg, no DVT,recent ECHO no evidence of endocarditis no other symptoms  Hep A,B,C neg, HIV neg   Appreciate ID recs, signed off.    11. Dysphagia: Continue dysphagia 1, nectar liquids with assistance at meals to help maintain adequate hydration.  12. Recent episode of  Depression: Now onCelexa last month.   13. Obesity             Body mass index is 32.91 kg/m.             Diet and exercise education             Will cont to encourage weight loss to increase endurance and promote overall health 14.  Hypokalemia    Supplemented x3 days   Potassium 3.2 on 2/11   Improved to 3.6 2/13   Mag ordered 15. Leukocytosis:    WBCs 16.6 on 2/11  Afebrile   Cont to monitor   See below 16. Obesity   Body mass index is 32.28 kg/m.   Diet and exercise education   Encourage weight loss to increase endurance and promote overall health 17. Hypernatremia  Na+ 153 on 2/11   Likely dehydration   IVF started on 2/11   Improved to 150 2/13 18.   E. coli UTI which was resistant to Keflex.  Began Bactrim twice daily for 7 days 07/02/2017  LOS (Days) 13 A FACE TO FACE EVALUATION WAS PERFORMED  SWARTZ,ZACHARY T 07/03/2017, 10:13 AM

## 2017-07-04 ENCOUNTER — Inpatient Hospital Stay (HOSPITAL_COMMUNITY): Payer: Medicare Other

## 2017-07-04 ENCOUNTER — Inpatient Hospital Stay (HOSPITAL_COMMUNITY): Payer: Medicare Other | Admitting: Physical Therapy

## 2017-07-04 ENCOUNTER — Inpatient Hospital Stay (HOSPITAL_COMMUNITY): Payer: Medicare Other | Admitting: Speech Pathology

## 2017-07-04 LAB — BASIC METABOLIC PANEL
Anion gap: 8 (ref 5–15)
BUN: 16 mg/dL (ref 6–20)
CHLORIDE: 106 mmol/L (ref 101–111)
CO2: 29 mmol/L (ref 22–32)
Calcium: 8.4 mg/dL — ABNORMAL LOW (ref 8.9–10.3)
Creatinine, Ser: 0.72 mg/dL (ref 0.44–1.00)
Glucose, Bld: 124 mg/dL — ABNORMAL HIGH (ref 65–99)
Potassium: 3.6 mmol/L (ref 3.5–5.1)
SODIUM: 143 mmol/L (ref 135–145)

## 2017-07-04 LAB — CBC
HCT: 39.8 % (ref 36.0–46.0)
Hemoglobin: 12.8 g/dL (ref 12.0–15.0)
MCH: 28.2 pg (ref 26.0–34.0)
MCHC: 32.2 g/dL (ref 30.0–36.0)
MCV: 87.7 fL (ref 78.0–100.0)
PLATELETS: 402 10*3/uL — AB (ref 150–400)
RBC: 4.54 MIL/uL (ref 3.87–5.11)
RDW: 14.6 % (ref 11.5–15.5)
WBC: 10.8 10*3/uL — ABNORMAL HIGH (ref 4.0–10.5)

## 2017-07-04 LAB — GLUCOSE, CAPILLARY
GLUCOSE-CAPILLARY: 98 mg/dL (ref 65–99)
GLUCOSE-CAPILLARY: 178 mg/dL — AB (ref 65–99)
Glucose-Capillary: 190 mg/dL — ABNORMAL HIGH (ref 65–99)
Glucose-Capillary: 168 mg/dL — ABNORMAL HIGH (ref 65–99)

## 2017-07-04 MED ORDER — INSULIN GLARGINE 100 UNIT/ML ~~LOC~~ SOLN
35.0000 [IU] | Freq: Every day | SUBCUTANEOUS | Status: DC
  Administered 2017-07-05 – 2017-07-14 (×10): 35 [IU] via SUBCUTANEOUS
  Filled 2017-07-04 (×10): qty 0.35

## 2017-07-04 MED ORDER — TAMSULOSIN HCL 0.4 MG PO CAPS
0.4000 mg | ORAL_CAPSULE | Freq: Every day | ORAL | Status: DC
  Administered 2017-07-04 – 2017-07-13 (×10): 0.4 mg via ORAL
  Filled 2017-07-04 (×10): qty 1

## 2017-07-04 NOTE — Progress Notes (Signed)
Subjective/Complaints:  No issues overnite, inc of bowel , overflow inc with bladder  ROS: limited due to language/communication   Objective: Vital Signs: Blood pressure 125/68, pulse 72, temperature 98.1 F (36.7 C), temperature source Oral, resp. rate 16, height 5' 5"  (1.651 m), weight 88 kg (194 lb 0.1 oz), SpO2 94 %.  Results for orders placed or performed during the hospital encounter of 06/20/17 (from the past 72 hour(s))  Glucose, capillary     Status: None   Collection Time: 07/01/17 11:31 AM  Result Value Ref Range   Glucose-Capillary 96 65 - 99 mg/dL  Glucose, capillary     Status: Abnormal   Collection Time: 07/01/17  4:38 PM  Result Value Ref Range   Glucose-Capillary 40 (LL) 65 - 99 mg/dL  Glucose, capillary     Status: Abnormal   Collection Time: 07/01/17  5:05 PM  Result Value Ref Range   Glucose-Capillary 42 (LL) 65 - 99 mg/dL  Glucose, capillary     Status: Abnormal   Collection Time: 07/01/17  5:37 PM  Result Value Ref Range   Glucose-Capillary 150 (H) 65 - 99 mg/dL  Glucose, capillary     Status: Abnormal   Collection Time: 07/01/17  8:54 PM  Result Value Ref Range   Glucose-Capillary 139 (H) 65 - 99 mg/dL  Glucose, capillary     Status: Abnormal   Collection Time: 07/02/17  7:07 AM  Result Value Ref Range   Glucose-Capillary 109 (H) 65 - 99 mg/dL  Glucose, capillary     Status: Abnormal   Collection Time: 07/02/17 11:38 AM  Result Value Ref Range   Glucose-Capillary 149 (H) 65 - 99 mg/dL  Glucose, capillary     Status: Abnormal   Collection Time: 07/02/17  4:45 PM  Result Value Ref Range   Glucose-Capillary 260 (H) 65 - 99 mg/dL  Glucose, capillary     Status: None   Collection Time: 07/02/17  9:03 PM  Result Value Ref Range   Glucose-Capillary 98 65 - 99 mg/dL  Glucose, capillary     Status: None   Collection Time: 07/03/17  6:55 AM  Result Value Ref Range   Glucose-Capillary 75 65 - 99 mg/dL  Glucose, capillary     Status: Abnormal   Collection Time: 07/03/17 12:05 PM  Result Value Ref Range   Glucose-Capillary 106 (H) 65 - 99 mg/dL  Glucose, capillary     Status: Abnormal   Collection Time: 07/03/17  5:10 PM  Result Value Ref Range   Glucose-Capillary 134 (H) 65 - 99 mg/dL  Glucose, capillary     Status: Abnormal   Collection Time: 07/03/17  8:53 PM  Result Value Ref Range   Glucose-Capillary 196 (H) 65 - 99 mg/dL  CBC     Status: Abnormal   Collection Time: 07/04/17  4:54 AM  Result Value Ref Range   WBC 10.8 (H) 4.0 - 10.5 K/uL   RBC 4.54 3.87 - 5.11 MIL/uL   Hemoglobin 12.8 12.0 - 15.0 g/dL   HCT 39.8 36.0 - 46.0 %   MCV 87.7 78.0 - 100.0 fL   MCH 28.2 26.0 - 34.0 pg   MCHC 32.2 30.0 - 36.0 g/dL   RDW 14.6 11.5 - 15.5 %   Platelets 402 (H) 150 - 400 K/uL    Comment: Performed at [facility], [address] [place], [place] [address]  Basic metabolic panel     Status: Abnormal   Collection Time: 07/04/17  4:54 AM  Result Value Ref Range   Sodium 143 135 - 145 mmol/L   Potassium 3.6 3.5 - 5.1 mmol/L   Chloride 106 101 - 111 mmol/L   CO2 29 22 - 32 mmol/L   Glucose, Bld 124 (H) 65 - 99 mg/dL   BUN 16 6 - 20 mg/dL   Creatinine, Ser 0.72 0.44 - 1.00 mg/dL   Calcium 8.4 (L) 8.9 - 10.3 mg/dL   GFR calc non Af Amer >60 >60 mL/min   GFR calc Af Amer >60 >60 mL/min    Comment: (NOTE) The eGFR has been calculated using the CKD EPI equation. This calculation has not been validated in all clinical situations. eGFR's persistently <60 mL/min signify possible Chronic Kidney Disease.    Anion gap 8 5 - 15    Comment: Performed at [facility] [address], [place], [place] [address]     HEENT: Normocephalic. Atraumatic. Cardio: RRR without murmur. No JVD .Marland Kitchen Resp: CTA Bilaterally without wheezes or rales. Normal effort   GI: BS positive and ND Musculoskeletal:  No tenderness and No Edema Skin:   Warm and dry. Intact. Neuro:  Awake, does not follow commands consistently Gen NAD. Vital  signs reviewed.  Psych: Limited due to a aphasia but appears to be fairly upbeat  Assessment/Plan: 1. Functional deficits secondary to Left MCA infarct with R HP, Aphasia which require 3+ hours per day of interdisciplinary therapy in a comprehensive inpatient rehab setting. Physiatrist is providing close team supervision and 24 hour management of active medical problems listed below. Physiatrist and rehab team continue to assess barriers to discharge/monitor patient progress toward functional and medical goals. FIM: Function - Bathing Position: Bed Body parts bathed by patient: Chest, Abdomen, Left upper leg Body parts bathed by helper: Right arm, Left arm, Front perineal area, Buttocks, Right upper leg, Right lower leg, Left lower leg, Back Assist Level: (max assist)  Function- Upper Body Dressing/Undressing What is the patient wearing?: Pull over shirt/dress Bra - Perfomed by helper: Thread/unthread right bra strap, Thread/unthread left bra strap, Hook/unhook bra (pull down sports bra) Pull over shirt/dress - Perfomed by patient: Thread/unthread left sleeve, Put head through opening Pull over shirt/dress - Perfomed by helper: Pull shirt over trunk, Thread/unthread right sleeve Assist Level: 2 helpers Function - Lower Body Dressing/Undressing What is the patient wearing?: Pants, Non-skid slipper socks Position: Bed Pants- Performed by helper: Thread/unthread right pants leg, Thread/unthread left pants leg, Pull pants up/down Non-skid slipper socks- Performed by helper: Don/doff right sock, Don/doff left sock Socks - Performed by helper: Don/doff right sock, Don/doff left sock Shoes - Performed by helper: Don/doff right shoe, Don/doff left shoe(slip on) Assist for footwear: Dependant Assist for lower body dressing: 2 Helpers  Function - Toileting Toileting activity did not occur: No continent bowel/bladder event Toileting steps completed by helper: Adjust clothing prior to toileting,  Performs perineal hygiene, Adjust clothing after toileting Toileting Assistive Devices: Grab bar or rail Assist level: Touching or steadying assistance (Pt.75%)  Function - Air cabin crew transfer activity did not occur: Safety/medical concerns Toilet transfer assistive device: Mechanical lift Mechanical lift: Stedy Assist level to toilet: 2 helpers Assist level from toilet: 2 helpers  Function - Chair/bed transfer Chair/bed transfer activity did not occur: Safety/medical concerns Chair/bed transfer method: Lateral scoot Chair/bed transfer assist level: 2 helpers Chair/bed transfer assistive device: Armrests, Sliding board Mechanical lift: Maximove Chair/bed transfer details: Manual facilitation for weight shifting  Function - Locomotion: Wheelchair Wheelchair activity did not occur: Safety/medical  concerns Assist Level: Maximal assistance (Pt 25 - 49%) Wheel 50 feet with 2 turns activity did not occur: Safety/medical concerns Assist Level: Maximal assistance (Pt 25 - 49%) Wheel 150 feet activity did not occur: Safety/medical concerns Function - Locomotion: Ambulation Ambulation activity did not occur: Safety/medical concerns Walk 10 feet activity did not occur: Safety/medical concerns Walk 50 feet with 2 turns activity did not occur: Safety/medical concerns Walk 150 feet activity did not occur: Safety/medical concerns Walk 10 feet on uneven surfaces activity did not occur: Safety/medical concerns  Function - Comprehension Comprehension: Auditory Comprehension assist level: Understands basic 25 - 49% of the time/ requires cueing 50 - 75% of the time  Function - Expression Expression: Verbal Expression assistive device: Other (Comment)(Nods.) Expression assist level: Expresses basis less than 25% of the time/requires cueing >75% of the time.  Function - Social Interaction Social Interaction assist level: Interacts appropriately less than 25% of the time. May be  withdrawn or combative.  Function - Problem Solving Problem solving assist level: Solves basic less than 25% of the time - needs direction nearly all the time or does not effectively solve problems and may need a restraint for safety  Function - Memory Memory assist level: Recognizes or recalls less than 25% of the time/requires cueing greater than 75% of the time Patient normally able to recall (first 3 days only): (unable to assess)  Medical Problem List and Plan: 1. Functional deficits secondary to  due to L-MCA stroke with extension   Aphasia with Right spastic hemiplegia   Cont CIR - PT, OT, SLP 2.  DVT Prophylaxis/Anticoagulation: Pharmaceutical: Lovenox 3. Pain Management: tylenol prn, knee pain RIght improving with Voltaren gel 4. Mood: LCSW to follow for evaluation when appropriate.  5. Neuropsych: This patient is not capable of making decisions on her own behalf. 6. Skin/Wound Care: Routine pressure relief measures. Maintain adequate nutritional and hydration status.  7. Fluids/Electrolytes/Nutrition: Monitor I/O.  8. T2DM: Hgb A1C- Monitor BS ac/hs and use SSI for elevated BS. Continue to titrate medications for tighter control.  CBG (last 3)  Recent Labs    07/03/17 1205 07/03/17 1710 07/03/17 2053  GLUCAP 106* 134* 196*    Lantus adjusted to 32 units. 8U novalog with meals   Serum glucose 124 this am 9. HTN: Improved control 2/16 Vitals:   07/03/17 1408 07/04/17 0036  BP: (!) 138/59 125/68  Pulse: 90 72  Resp: 18 16  Temp: 98.2 F (36.8 C) 98.1 F (36.7 C)  SpO2: 90% 94%    Started amlodipine 2/6, Controlled 2/18     10. Fever: resolved   off Unasyn per ID. CXR neg , repeat urine neg, no DVT,recent ECHO no evidence of endocarditis no other symptoms  Hep A,B,C neg, HIV neg   Appreciate ID recs, signed off.    11. Dysphagia: Continue dysphagia 1, nectar liquids with assistance at meals to help maintain adequate hydration.  12. Recent episode of  Depression:  Now onCelexa last month.   13. Obesity             Body mass index is 32.91 kg/m.             Diet and exercise education             Will cont to encourage weight loss to increase endurance and promote overall health 14.  Hypokalemia    Supplemented x3 days   Potassium 3.2 on 2/11   Improved to 3.6 2/13, stable 2/18  Mag ordered 15. Leukocytosis: improving   WBCs 1o.6 on 2/18  Afebrile   Cont to monitor   See below 16. Obesity   Body mass index is 32.28 kg/m.   Diet and exercise education   Encourage weight loss to increase endurance and promote overall health 17. Hypernatremia  Na+ 153 on 2/11   Likely dehydration   IVF started on 2/11   Improved to 143 2/18 18.   E. coli UTI which was resistant to Keflex.  Began Bactrim twice daily for 7 days 07/02/2017 Urinary retention, ICP add, flomax LOS (Days) 14 A FACE TO FACE EVALUATION WAS PERFORMED  Charlett Blake 07/04/2017, 8:12 AM

## 2017-07-04 NOTE — Progress Notes (Signed)
Physical Therapy Session Note  Patient Details  Name: Sandra [name] MRN: [id number] Date of Birth: 1940-09-05  Today's Date: 07/04/2017 PT Individual Time: 1000-1100 PT Individual Time Calculation (min): 60 min   Short Term Goals: Week 2:  PT Short Term Goal 1 (Week 2): Pt will transfer with slide board and +1 assist.   PT Short Term Goal 2 (Week 2): Pt will propel standard w/c 25' with mod assist PT Short Term Goal 3 (Week 2): Pt will transition to standing with +1 assist and LRAD  Skilled Therapeutic Interventions/Progress Updates:    no c/o pain except with passive movement of RLE.  Session focus on initiation, following commands, attention, and functional mobility.    Pt incontinent of bowel on arrival, incontinent of urine during peri-care.  Pt requires min assist to roll R/L and total assist for peri-care and LB dressing from bed level.  Supine>sit with max assist and min/mod assist for sitting balance during UB dressing EOB.  +1 for slide board transfer to w/c on R, pt initiating forward weight shift and lifting bottom.  Pt engaged in [place] at sink with mirror for feedback for midline orientation and attention to RUE.  Pt positioned upright in w/c with QRB in place, call bell in reach and needs met.   Therapy Documentation Precautions:  Precautions Precautions: Fall Precaution Comments: dense R hemiparesis, R inattention Restrictions Weight Bearing Restrictions: No Other Position/Activity Restrictions: Positioning of right upper extremity in sight of patient and not pulling on it as it is flaccid.    See Function Navigator for Current Functional Status.   Therapy/Group: Individual Therapy  Michel Santee 07/04/2017, 12:01 PM

## 2017-07-04 NOTE — Progress Notes (Signed)
Occupational Therapy Session Note  Patient Details  Name: Sandra [name] MRN: [id number] Date of Birth: 1940-05-28  Today's Date: 07/04/2017 OT Individual Time: 1130-1156 OT Individual Time Calculation (min): 26 min    Skilled Therapeutic Interventions/Progress Updates:    1:1. Pt seated in w.c upon arrival. OT propels w/c to/from all tx destinations for time management. Attempted to engage patient in 2 min dynavision, however pt aimessly pressing buttons despite if lit up or not. In ADL apartment, pt sorts silverware into organizer scanning R to place in Comptroller. Pt accurately sorts 65% of silverware on first attempt. Pt completes towel glides to wipe off table in kitchen seated in w/c with mod HOH A to facilitate LUE moving RUE to wipe table for NMR and RUE attention. Exited session with pt seated in w.c, QRB donned and call light in reach  (note signed 5 days late in error)  Therapy Documentation Precautions:  Precautions Precautions: Fall Precaution Comments: dense R hemiparesis, R inattention Restrictions Weight Bearing Restrictions: No Other Position/Activity Restrictions: Positioning of right upper extremity in sight of patient and not pulling on it as it is flaccid.   See Function Navigator for Current Functional Status.   Therapy/Group: Individual Therapy  Tonny Branch 07/04/2017, 12:00 PM

## 2017-07-04 NOTE — Progress Notes (Signed)
Speech Language Pathology Daily Session Note  Patient Details  Name: Sandra [name] MRN: [id number] Date of Birth: [birth year]  Today's Date: 07/04/2017 SLP Individual Time: 1345-1430 SLP Individual Time Calculation (min): 45 min  Short Term Goals: Week 2: SLP Short Term Goal 1 (Week 2): Pt will answer yes/no immediate, personally relevant questions in 50% of opportunities with max assist multimodal cues.   SLP Short Term Goal 2 (Week 2): Pt will follow 1 step commands for 50% accuracy during basic, familiar tasks with max assist multimodal cues.   SLP Short Term Goal 3 (Week 2): Pt will sustain her attention to basic, familiar tasks for 1 minute intervals with max verbal cues for redirection.   SLP Short Term Goal 4 (Week 2): Pt will consume dys 1 textures and thin liquids with mod verbal cues for use of swallowing precautions and minimal overt s/s of aspiration.   SLP Short Term Goal 5 (Week 2): Pt will initiate  basic familiar task (such as self-feeding) in 50% of opportunities with Max A cues.  SLP Short Term Goal 6 (Week 2): Given Max A cues, pt will select requested object from field of 2 in 50% of opportunities.   Skilled Therapeutic Interventions: Skilled treatment session focused on communication goals. SLP facilitated session by providing Max A multimodal cues for intonation in unison using Melodic Intonation Therapy (MIT). Pt was able to intonate her own name x2 and "bye bye" x3 with faded unison intonation. SLP attempted other functional words, however, pt not stimulable. Therefore, SLP also facilitated session by providing multimodal cues for production of individual phonemes. Pt was able to produce the phoneme /a/ with 50% accuracy with Max A multimodal cues. Patient required Mod A verbal cues for 1-step commands with 80% accuracy during basic tasks. Pt left upright in bed with alarm on and all needs within reach. Continue with current plan of care.     Function:  Cognition Comprehension Comprehension assist level: Understands basic 25 - 49% of the time/ requires cueing 50 - 75% of the time  Expression   Expression assist level: Expresses basis less than 25% of the time/requires cueing >75% of the time.  Social Interaction Social Interaction assist level: Interacts appropriately 25 - 49% of time - Needs frequent redirection.  Problem Solving Problem solving assist level: Solves basic less than 25% of the time - needs direction nearly all the time or does not effectively solve problems and may need a restraint for safety  Memory Memory assist level: Recognizes or recalls less than 25% of the time/requires cueing greater than 75% of the time    Pain Pain Assessment Pain Assessment: No/denies pain  Therapy/Group: Individual Therapy  Meredeth Ide  SLP - Student 07/04/2017, 3:17 PM

## 2017-07-04 NOTE — Progress Notes (Signed)
Physical Therapy Session Note  Patient Details  Name: Sandra [name] MRN: [id number] Date of Birth: Dec 17, 1940  Today's Date: 07/04/2017 PT Individual Time: 1430-1530 PT Individual Time Calculation (min): 60 min   Short Term Goals: Week 2:  PT Short Term Goal 1 (Week 2): Pt will transfer with slide board and +1 assist.   PT Short Term Goal 2 (Week 2): Pt will propel standard w/c 25' with mod assist PT Short Term Goal 3 (Week 2): Pt will transition to standing with +1 assist and LRAD  Skilled Therapeutic Interventions/Progress Updates: Pt received supine in bed, no evidence of pain throughout session and agreeable to treatment. Pt dons pants in supine with maxA; able to thread LLE however requires assist for threading RLE and pulling pants over hips with bridging. Supine>sit maxA, tactile cues for cervical flexion to facilitate trunk activation. Sitting balance on edge of bed modA; repetitive L prop on elbow to facilitate neutral alignment and midline orientation. Lateral scoot transfer bed>w/c >mat table to R side with transfer board and maxA +2 d/t strong pushing with L extremities; tactile cues for facilitating L anterolateral weight shift for head/hips relationship. Sitting on mat, R pelvis propped with small red wedge to facilitate neutral pelvic alignment; demo's improved midline postural alignment able to maintain approx 30 sec with S. Performed L lateral reaching with LUE to retrieve horseshoes with tactile/verbal cues for L weight shift; able to reach horseshoe and cross midline with encouragement. Lateral scoot to R into w/c totalA +2 for energy conservation. Sit <>stand at L hall rail with totalA>maxA with repetition and improved LE activation, trunk extension. Utilized Estate manager/land agent for postural alignment and awareness. Returned to bed totalA +2 squat pivot; maxA sit >supine with pt impulsively laying straight back before turning into bed. Remained supine in bed, RUE elevated on  pillow, heels floated, alarm intact and all needs in reach.      Therapy Documentation Precautions:  Precautions Precautions: Fall Precaution Comments: dense R hemiparesis, R inattention Restrictions Weight Bearing Restrictions: No Other Position/Activity Restrictions: Positioning of right upper extremity in sight of patient and not pulling on it as it is flaccid.  Pain: Pain Assessment Pain Assessment: No/denies pain   See Function Navigator for Current Functional Status.   Therapy/Group: Individual Therapy  Luberta Mutter 07/04/2017, 3:35 PM

## 2017-07-05 ENCOUNTER — Inpatient Hospital Stay (HOSPITAL_COMMUNITY): Payer: Medicare Other | Admitting: Physical Therapy

## 2017-07-05 ENCOUNTER — Inpatient Hospital Stay (HOSPITAL_COMMUNITY): Payer: Medicare Other | Admitting: Speech Pathology

## 2017-07-05 ENCOUNTER — Inpatient Hospital Stay (HOSPITAL_COMMUNITY): Payer: Medicare Other | Admitting: *Deleted

## 2017-07-05 ENCOUNTER — Inpatient Hospital Stay (HOSPITAL_COMMUNITY): Payer: Medicare Other | Admitting: Occupational Therapy

## 2017-07-05 LAB — GLUCOSE, CAPILLARY
GLUCOSE-CAPILLARY: 174 mg/dL — AB (ref 65–99)
Glucose-Capillary: 152 mg/dL — ABNORMAL HIGH (ref 65–99)
Glucose-Capillary: 173 mg/dL — ABNORMAL HIGH (ref 65–99)
Glucose-Capillary: 152 mg/dL — ABNORMAL HIGH (ref 65–99)

## 2017-07-05 MED ORDER — BETHANECHOL CHLORIDE 10 MG PO TABS
5.0000 mg | ORAL_TABLET | Freq: Three times a day (TID) | ORAL | Status: DC
  Administered 2017-07-05 – 2017-07-06 (×4): 5 mg via ORAL
  Filled 2017-07-05 (×4): qty 1

## 2017-07-05 NOTE — Progress Notes (Signed)
Physical Therapy Session Note  Patient Details  Name: Sandra [name] MRN: [id number] Date of Birth: [birth year]  Today's Date: 07/05/2017 PT Individual Time: 1000-1100 PT Individual Time Calculation (min): 60 min   Short Term Goals: Week 2:  PT Short Term Goal 1 (Week 2): Pt will transfer with slide board and +1 assist.   PT Short Term Goal 2 (Week 2): Pt will propel standard w/c 25' with mod assist PT Short Term Goal 3 (Week 2): Pt will transition to standing with +1 assist and LRAD  Skilled Therapeutic Interventions/Progress Updates:    no c/o pain.  Session focus on positioning in w/c and standing tolerance.    Pt continually scooting down in chair requiring +2 assist for scooting back.  Clarise Cruz + for sit<>stand x3 attempts with final attempt being the only successful attempt.  On first attempt, pt lifting BLEs into the air, on second attempt pt with feet on the floor but knees flexed.  Final attempt with knee block in place, pt able to stand x5 minutes with max cues for L weight shift and assist to keep Clarise Cruz + from sliding to the R due to strong R lean.  Pt incontinent of bowel in standing, returned to room.  Max assist slide board transfer back to bed on L and sit>supine with max assist.  Pt able to roll R and L with up to min assist and therapist provided total assist for clothing management and hygiene for smear BM.  Pt positioned to comfort in bed with call bell in reach and needs met.   Therapy Documentation Precautions:  Precautions Precautions: Fall Precaution Comments: dense R hemiparesis, R inattention Restrictions Weight Bearing Restrictions: No Other Position/Activity Restrictions: Positioning of right upper extremity in sight of patient and not pulling on it as it is flaccid.    See Function Navigator for Current Functional Status.   Therapy/Group: Individual Therapy  Michel Santee 07/05/2017, 11:45 AM

## 2017-07-05 NOTE — Progress Notes (Signed)
Physical Therapy Session Note  Patient Details  Name: Sandra [name] MRN: [id number] Date of Birth: [birth year]  Today's Date: 07/05/2017 PT Individual Time: 0805-0900 PT Individual Time Calculation (min): 55 min   Short Term Goals: Week 2:  PT Short Term Goal 1 (Week 2): Pt will transfer with slide board and +1 assist.   PT Short Term Goal 2 (Week 2): Pt will propel standard w/c 25' with mod assist PT Short Term Goal 3 (Week 2): Pt will transition to standing with +1 assist and LRAD  Skilled Therapeutic Interventions/Progress Updates: Pt presented in bed, PTA provided pants. Pt able to initiate LLE for threading and RLE x 1 however unable to repeat action with cues. Pt initiated rolling R to perform to pull up pants. Provided verbal cues and HOH placement for rolling to L to pull up other side. Supine to sit modA with max cues for BLE management. Pt able to use LUE and bed rail to push self to upright position with modA for truncal control. Performed SB transfer to R with pt difficulty following sequencing with max multimodal cues. Pt noted to be reaching for bed rail and other objects on bed. SB transfer completed with maxA x 2. Attempted w/c propulsion with PTA providing HOH assist for LLE management. Pt able to use LUE for propulsion but unable at this time to coordinate use of LLE. Pt transported to day room at trialed use of Cy[employer] Kinetron at 80cm/sec. Pt able to complete cycle with PTA initiating and able to complete 5 cycles without assist. Pt noted to become increasingly distracted reaching for sock and/or leaning over w/c. Pt returned to room and left with half lap tray in place and QRB on with needs met.      Therapy Documentation Precautions:  Precautions Precautions: Fall Precaution Comments: dense R hemiparesis, R inattention Restrictions Weight Bearing Restrictions: No Other Position/Activity Restrictions: Positioning of right upper extremity in sight of patient and not pulling  on it as it is flaccid.  General:   Vital Signs: Therapy Vitals Temp: 97.8 F (36.6 C) Temp Source: Oral Pulse Rate: 70 Resp: 18 BP: 137/66 Patient Position (if appropriate): Sitting Oxygen Therapy SpO2: 97 % O2 Device: Not Delivered See Function Navigator for Current Functional Status.   Therapy/Group: Individual Therapy  Abu Heavin  Zakeria Kulzer, PTA  07/05/2017, 4:01 PM

## 2017-07-05 NOTE — Progress Notes (Signed)
Speech Language Pathology Weekly Progress and Daily Session Note  Patient Details  Name: Sandra [name] MRN: [id number] Date of Birth: 1941/05/02  Beginning of progress report period: June 27, 2017 End of progress report period: July 05, 2017  Today's Date: 07/05/2017 SLP Individual Time: 1345-1430 SLP Individual Time Calculation (min): 45 min  Short Term Goals: Week 2: SLP Short Term Goal 1 (Week 2): Pt will answer yes/no immediate, personally relevant questions in 50% of opportunities with max assist multimodal cues.   SLP Short Term Goal 1 - Progress (Week 2): Met SLP Short Term Goal 2 (Week 2): Pt will follow 1 step commands for 50% accuracy during basic, familiar tasks with max assist multimodal cues.   SLP Short Term Goal 2 - Progress (Week 2): Met SLP Short Term Goal 3 (Week 2): Pt will sustain her attention to basic, familiar tasks for 1 minute intervals with max verbal cues for redirection.   SLP Short Term Goal 3 - Progress (Week 2): Met SLP Short Term Goal 4 (Week 2): Pt will consume dys 1 textures and thin liquids with mod verbal cues for use of swallowing precautions and minimal overt s/s of aspiration.   SLP Short Term Goal 4 - Progress (Week 2): Not met SLP Short Term Goal 5 (Week 2): Pt will initiate  basic familiar task (such as self-feeding) in 50% of opportunities with Max A cues.  SLP Short Term Goal 5 - Progress (Week 2): Met SLP Short Term Goal 6 (Week 2): Given Max A cues, pt will select requested object from field of 2 in 50% of opportunities.  SLP Short Term Goal 6 - Progress (Week 2): Met    New Short Term Goals: Week 3: SLP Short Term Goal 1 (Week 3): Pt will answer yes/no immediate, personally relevant questions in 75% of opportunities with Mod A multimodal cues.   SLP Short Term Goal 2 (Week 3): Pt will follow 1 step commands for 75% accuracy during basic, familiar tasks with Mod A multimodal cues.   SLP Short Term Goal 3 (Week 3): Pt will sustain  her attention to basic, familiar tasks for 5 minute intervals with Mod A verbal cues for redirection.   SLP Short Term Goal 4 (Week 3): Pt will produce phonemes in isolation with 50% accuracy with Max A multimodal cues. SLP Short Term Goal 5 (Week 3): Given Mod A multimodal cues, pt will select requested object from field of 2 in 75% of opportunities.  SLP Short Term Goal 6 (Week 3): Pt will consume dys 1 textures and thin liquids with mod verbal cues for use of swallowing precautions and minimal overt s/s of aspiration.     Weekly Progress Updates: Pt has made functional gains and has met 5 out of 6 STGs this reporting period. Currently, pt requires overall Max A multimodal cues for verbalization and intonation of functional and basic words. Pt is able to spontanesouly produce automatic verbal responses, such as "bye", "okay", and "no" intermittently. Pt requires Mod - Max A verbal cues to answer yes/no questions with 60% accuracy, follow 1-step commands, initiate basic familiar tasks, and select requested object from a field of 2. Pt demonstrates sustained attention to basic tasks for 3 minutes with Min A verbal cues for redirection. Pt consumes thin liquids via Provale cup without overt s/s of aspiration and demonstrates increased problem solving with use of Provale cup. This reporting period has focused on communication goals, however, next reporting period SLP will incorporate dysphagia goals in  regards to solid textures. Patient and family education is ongoing. Patient would benefit from continued skilled SLP intervention to maximize her linguistic function and overall functional independence prior to discharge.    Intensity: Minumum of 1-2 x/day, 30 to 90 minutes Frequency: 3 to 5 out of 7 days Duration/Length of Stay: 07/11/2017 Treatment/Interventions: Cognitive remediation/compensation;Cueing hierarchy;Dysphagia/aspiration precaution training;Functional tasks;Environmental  controls;Internal/external aids;Multimodal communication approach;Patient/family education;Speech/Language facilitation   Daily Session  Skilled Therapeutic Interventions: Skilled treatment session focused on communication and dysphagia goals. SLP facilitated session by providing Mod A verbal cues to select requested object in field of 2 with 60% accuracy. Pt required Mod A verbal cues to identify yes/no questions with head nods with 60% accuracy; however, pt required Max A multimodal cues to match her responses with "yes" and "no" cards with 50% accuracy. Pt able to follow 1-step commands during basic tasks with 50% accuracy and to sustain attention to basic tasks for 3 minutes with Min A verbal cues for redirection. Pt spontaneously verbalized "no" x3 and "that one" x1 throughout session. Pt consumed thin liquids via Provale cup without overt s/s of aspiration and demonstrated increased problem solving with use of Provale cup. Pt handed off to RN. Continue with current plan of care.   Function:   Eating Eating   Modified Consistency Diet: Yes Eating Assist Level: Supervision or verbal cues           Cognition Comprehension Comprehension assist level: Understands basic 25 - 49% of the time/ requires cueing 50 - 75% of the time  Expression   Expression assist level: Expresses basis less than 25% of the time/requires cueing >75% of the time.  Social Interaction Social Interaction assist level: Interacts appropriately less than 25% of the time. May be withdrawn or combative.  Problem Solving Problem solving assist level: Solves basic less than 25% of the time - needs direction nearly all the time or does not effectively solve problems and may need a restraint for safety  Memory Memory assist level: Recognizes or recalls less than 25% of the time/requires cueing greater than 75% of the time   Pain Pain Assessment Pain Assessment: No/denies pain  Therapy/Group: Individual Therapy  Meredeth Ide  SLP - Student 07/05/2017, 3:28 PM

## 2017-07-05 NOTE — Progress Notes (Signed)
Subjective/Complaints:  Req maxA x 1 for transfer  ROS: limited due to language/communication   Objective: Vital Signs: Blood pressure 127/60, pulse 70, temperature 98.9 F (37.2 C), temperature source Oral, resp. rate 16, height 5' 5"  (1.651 m), weight 88 kg (194 lb 0.1 oz), SpO2 92 %.  Results for orders placed or performed during the hospital encounter of 06/20/17 (from the past 72 hour(s))  Glucose, capillary     Status: Abnormal   Collection Time: 07/02/17 11:38 AM  Result Value Ref Range   Glucose-Capillary 149 (H) 65 - 99 mg/dL  Glucose, capillary     Status: Abnormal   Collection Time: 07/02/17  4:45 PM  Result Value Ref Range   Glucose-Capillary 260 (H) 65 - 99 mg/dL  Glucose, capillary     Status: None   Collection Time: 07/02/17  9:03 PM  Result Value Ref Range   Glucose-Capillary 98 65 - 99 mg/dL  Glucose, capillary     Status: None   Collection Time: 07/03/17  6:55 AM  Result Value Ref Range   Glucose-Capillary 75 65 - 99 mg/dL  Glucose, capillary     Status: Abnormal   Collection Time: 07/03/17 12:05 PM  Result Value Ref Range   Glucose-Capillary 106 (H) 65 - 99 mg/dL  Glucose, capillary     Status: Abnormal   Collection Time: 07/03/17  5:10 PM  Result Value Ref Range   Glucose-Capillary 134 (H) 65 - 99 mg/dL  Glucose, capillary     Status: Abnormal   Collection Time: 07/03/17  8:53 PM  Result Value Ref Range   Glucose-Capillary 196 (H) 65 - 99 mg/dL  CBC     Status: Abnormal   Collection Time: 07/04/17  4:54 AM  Result Value Ref Range   WBC 10.8 (H) 4.0 - 10.5 K/uL   RBC 4.54 3.87 - 5.11 MIL/uL   Hemoglobin 12.8 12.0 - 15.0 g/dL   HCT 39.8 36.0 - 46.0 %   MCV 87.7 78.0 - 100.0 fL   MCH 28.2 26.0 - 34.0 pg   MCHC 32.2 30.0 - 36.0 g/dL   RDW 14.6 11.5 - 15.5 %   Platelets 402 (H) 150 - 400 K/uL    Comment: Performed at [facility], [place] [address], [place], [place] [address]  Basic metabolic panel     Status: Abnormal   Collection Time:  07/04/17  4:54 AM  Result Value Ref Range   Sodium 143 135 - 145 mmol/L   Potassium 3.6 3.5 - 5.1 mmol/L   Chloride 106 101 - 111 mmol/L   CO2 29 22 - 32 mmol/L   Glucose, Bld 124 (H) 65 - 99 mg/dL   BUN 16 6 - 20 mg/dL   Creatinine, Ser 0.72 0.44 - 1.00 mg/dL   Calcium 8.4 (L) 8.9 - 10.3 mg/dL   GFR calc non Af Amer >60 >60 mL/min   GFR calc Af Amer >60 >60 mL/min    Comment: (NOTE) The eGFR has been calculated using the CKD EPI equation. This calculation has not been validated in all clinical situations. eGFR's persistently <60 mL/min signify possible Chronic Kidney Disease.    Anion gap 8 5 - 15    Comment: Performed at [facility] [address], [place], [place] [address]  Glucose, capillary     Status: None   Collection Time: 07/04/17  6:34 AM  Result Value Ref Range   Glucose-Capillary 98 65 - 99 mg/dL  Glucose, capillary  Status: Abnormal   Collection Time: 07/04/17 11:59 AM  Result Value Ref Range   Glucose-Capillary 178 (H) 65 - 99 mg/dL  Glucose, capillary     Status: Abnormal   Collection Time: 07/04/17  4:33 PM  Result Value Ref Range   Glucose-Capillary 190 (H) 65 - 99 mg/dL  Glucose, capillary     Status: Abnormal   Collection Time: 07/04/17  9:21 PM  Result Value Ref Range   Glucose-Capillary 168 (H) 65 - 99 mg/dL  Glucose, capillary     Status: Abnormal   Collection Time: 07/05/17  6:23 AM  Result Value Ref Range   Glucose-Capillary 174 (H) 65 - 99 mg/dL     HEENT: Normocephalic. Atraumatic. Cardio: RRR without murmur. No JVD .Marland Kitchen Resp: CTA Bilaterally without wheezes or rales. Normal effort   GI: BS positive and ND Musculoskeletal:  No tenderness and No Edema Skin:   Warm and dry. Intact. Neuro:  Awake, does not follow commands consistently Gen NAD. Vital signs reviewed.  Psych: Limited due to a aphasia but appears to be fairly upbeat  Assessment/Plan: 1. Functional deficits secondary to Left MCA infarct with R HP, Aphasia which require  3+ hours per day of interdisciplinary therapy in a comprehensive inpatient rehab setting. Physiatrist is providing close team supervision and 24 hour management of active medical problems listed below. Physiatrist and rehab team continue to assess barriers to discharge/monitor patient progress toward functional and medical goals. FIM: Function - Bathing Position: Bed Body parts bathed by patient: Chest, Abdomen, Left upper leg Body parts bathed by helper: Right arm, Left arm, Front perineal area, Buttocks, Right upper leg, Right lower leg, Left lower leg, Back Assist Level: (max assist)  Function- Upper Body Dressing/Undressing What is the patient wearing?: Pull over shirt/dress Bra - Perfomed by helper: Thread/unthread right bra strap, Thread/unthread left bra strap, Hook/unhook bra (pull down sports bra) Pull over shirt/dress - Perfomed by patient: Thread/unthread left sleeve, Put head through opening Pull over shirt/dress - Perfomed by helper: Pull shirt over trunk, Thread/unthread right sleeve Assist Level: 2 helpers Function - Lower Body Dressing/Undressing What is the patient wearing?: Pants, Non-skid slipper socks Position: Bed Pants- Performed by helper: Thread/unthread right pants leg, Thread/unthread left pants leg, Pull pants up/down Non-skid slipper socks- Performed by helper: Don/doff right sock, Don/doff left sock Socks - Performed by helper: Don/doff right sock, Don/doff left sock Shoes - Performed by helper: Don/doff right shoe, Don/doff left shoe(slip on) Assist for footwear: Dependant Assist for lower body dressing: 2 Helpers  Function - Toileting Toileting activity did not occur: No continent bowel/bladder event Toileting steps completed by helper: Adjust clothing prior to toileting, Performs perineal hygiene, Adjust clothing after toileting Toileting Assistive Devices: Grab bar or rail Assist level: Touching or steadying assistance (Pt.75%)  Function - Engineer, petroleum transfer activity did not occur: Safety/medical concerns Toilet transfer assistive device: Mechanical lift Mechanical lift: Stedy Assist level to toilet: 2 helpers Assist level from toilet: 2 helpers  Function - Chair/bed transfer Chair/bed transfer activity did not occur: Safety/medical concerns Chair/bed transfer method: Lateral scoot Chair/bed transfer assist level: 2 helpers Chair/bed transfer assistive device: Armrests, Sliding board Mechanical lift: Maximove Chair/bed transfer details: Manual facilitation for weight shifting, Verbal cues for precautions/safety, Tactile cues for posture, Tactile cues for placement, Tactile cues for weight shifting, Tactile cues for sequencing, Tactile cues for weight bearing  Function - Locomotion: Wheelchair Wheelchair activity did not occur: Safety/medical concerns Assist Level: Maximal assistance (Pt 25 - 49%)  Wheel 50 feet with 2 turns activity did not occur: Safety/medical concerns Assist Level: Maximal assistance (Pt 25 - 49%) Wheel 150 feet activity did not occur: Safety/medical concerns Function - Locomotion: Ambulation Ambulation activity did not occur: Safety/medical concerns Walk 10 feet activity did not occur: Safety/medical concerns Walk 50 feet with 2 turns activity did not occur: Safety/medical concerns Walk 150 feet activity did not occur: Safety/medical concerns Walk 10 feet on uneven surfaces activity did not occur: Safety/medical concerns  Function - Comprehension Comprehension: Auditory Comprehension assist level: Understands basic 25 - 49% of the time/ requires cueing 50 - 75% of the time  Function - Expression Expression: Verbal Expression assistive device: Other (Comment)(Nods.) Expression assist level: Expresses basis less than 25% of the time/requires cueing >75% of the time.  Function - Social Interaction Social Interaction assist level: Interacts appropriately less than 25% of the time. May be  withdrawn or combative.  Function - Problem Solving Problem solving assist level: Solves basic less than 25% of the time - needs direction nearly all the time or does not effectively solve problems and may need a restraint for safety  Function - Memory Memory assist level: Recognizes or recalls less than 25% of the time/requires cueing greater than 75% of the time Patient normally able to recall (first 3 days only): (unable to assess)  Medical Problem List and Plan: 1. Functional deficits secondary to  due to L-MCA stroke with extension   Aphasia with Right spastic hemiplegia   Cont CIR - PT, OT, SLP team conf in am 2.  DVT Prophylaxis/Anticoagulation: Pharmaceutical: Lovenox 3. Pain Management: tylenol prn, knee pain RIght improving with Voltaren gel 4. Mood: LCSW to follow for evaluation when appropriate.  5. Neuropsych: This patient is not capable of making decisions on her own behalf. 6. Skin/Wound Care: Routine pressure relief measures. Maintain adequate nutritional and hydration status.  7. Fluids/Electrolytes/Nutrition: Monitor I/O.  8. T2DM: Hgb A1C- Monitor BS ac/hs and use SSI for elevated BS. Continue to titrate medications for tighter control.  CBG (last 3)  Recent Labs    07/04/17 1633 07/04/17 2121 07/05/17 0623  GLUCAP 190* 168* 174*    Lantus adjusted to 35 units on 2/19. 8U novalog with meals    9. HTN: Improved control 2/16 Vitals:   07/04/17 1558 07/05/17 0527  BP: (!) 140/53 127/60  Pulse: 76 70  Resp: 16 16  Temp: 98.3 F (36.8 C) 98.9 F (37.2 C)  SpO2: 95% 92%    Started amlodipine 2/6, Controlled 2/19     10. Fever: resolved   off Unasyn per ID. CXR neg , repeat urine neg, no DVT,recent ECHO no evidence of endocarditis no other symptoms  Hep A,B,C neg, HIV neg   Appreciate ID recs, signed off.    11. Dysphagia: Continue dysphagia 1, nectar liquids with assistance at meals to help maintain adequate hydration.  12. Recent episode of  Depression: Now  onCelexa last month.   13. Obesity             Body mass index is 32.91 kg/m.             Diet and exercise education             Will cont to encourage weight loss to increase endurance and promote overall health 14.  Hypokalemia    Supplemented x3 days   Potassium 3.2 on 2/11   Improved to 3.6 2/13, stable 2/18   Mag ordered 15. Leukocytosis: improving  WBCs 10.6 on 2/18  Afebrile   Cont to monitor   See below 16. Obesity   Body mass index is 32.28 kg/m.   Diet and exercise education   Encourage weight loss to increase endurance and promote overall health 17. Hypernatremia  Na+ 153 on 2/11   Likely dehydration   IVF started on 2/11   Improved to 143 2/18 18.   E. coli UTI which was resistant to Keflex.  Began Bactrim twice daily for 7 days 07/02/2017 Urinary retention, ICP add, flomax, urecholine LOS (Days) 15 A FACE TO FACE EVALUATION WAS PERFORMED  Charlett Blake 07/05/2017, 8:20 AM

## 2017-07-05 NOTE — Progress Notes (Signed)
Occupational Therapy Session Note  Patient Details  Name: Sandra [name] MRN: [id number] Date of Birth: 03-13-1941  Today's Date: 07/05/2017 OT Individual Time: 5784-6962 OT Individual Time Calculation (min): 44 min   Short Term Goals: Week 2:  OT Short Term Goal 1 (Week 2): Pt will transfer to w/c with MAX A of 1 in prep for [facility] transfer OT Short Term Goal 2 (Week 2): Pt will maintain static sitting balance for  5 min EOM/EOB with min A in prep for toileting OT Short Term Goal 3 (Week 2): Pt will consistently follow 2 step commands with mod cues within BADL task  Skilled Therapeutic Interventions/Progress Updates:    Pt greeted in bed with nursing present finishing meds. OT assisted with threading R LE into pants, then was able to thread LLE without verbal cues, then initiated bridging to pull pants up over hips. Pt came to sitting EOB with Mod A to advance LEs and max A to elevate trunk. Stool placed under feet 2/2 height to allow pt to push through legs with SB transfer to the R. Pt able to complete SB transfer to R with Mod/Max A of 1 helper. Worked on sequencing and initiation with hand washing task at the sink. Hand over hand A initially to integrate R UE, then pt attended to R hand to wash hands. With increaed time, pt eventually able to terminate task and turn water off on L and R side without VC. Pt left seated in TIS wc at end of session with needs met and safety belt in place.   Therapy Documentation Precautions:  Precautions Precautions: Fall Precaution Comments: dense R hemiparesis, R inattention Restrictions Weight Bearing Restrictions: No Other Position/Activity Restrictions: Positioning of right upper extremity in sight of patient and not pulling on it as it is flaccid.   See Function Navigator for Current Functional Status.   Therapy/Group: Individual Therapy  Valma Cava 07/05/2017, 1:35 PM

## 2017-07-06 ENCOUNTER — Inpatient Hospital Stay (HOSPITAL_COMMUNITY): Payer: Medicare Other | Admitting: Occupational Therapy

## 2017-07-06 ENCOUNTER — Inpatient Hospital Stay (HOSPITAL_COMMUNITY): Payer: Medicare Other | Admitting: Speech Pathology

## 2017-07-06 ENCOUNTER — Inpatient Hospital Stay (HOSPITAL_COMMUNITY): Payer: Medicare Other | Admitting: Physical Therapy

## 2017-07-06 LAB — GLUCOSE, CAPILLARY
Glucose-Capillary: 102 mg/dL — ABNORMAL HIGH (ref 65–99)
Glucose-Capillary: 171 mg/dL — ABNORMAL HIGH (ref 65–99)
Glucose-Capillary: 65 mg/dL (ref 65–99)
Glucose-Capillary: 126 mg/dL — ABNORMAL HIGH (ref 65–99)
Glucose-Capillary: 106 mg/dL — ABNORMAL HIGH (ref 65–99)

## 2017-07-06 MED ORDER — BETHANECHOL CHLORIDE 10 MG PO TABS
10.0000 mg | ORAL_TABLET | Freq: Three times a day (TID) | ORAL | Status: DC
  Administered 2017-07-06 – 2017-07-07 (×3): 10 mg via ORAL
  Filled 2017-07-06 (×3): qty 1

## 2017-07-06 NOTE — Patient Care Conference (Signed)
Inpatient RehabilitationTeam Conference and Plan of Care Update Date: 07/06/2017   Time: 11:15 AM    Patient Name: Sandra [name]      Medical Record Number: [id number]  Date of Birth: 07-31-1940 Sex: Female         Room/Bed: 4W19C/4W19C-01 Payor Info: Payor: MEDICARE / Plan: MEDICARE PART A AND B / Product Type: *No Product type* /    Admitting Diagnosis: L CVA  Admit Date/Time:  06/20/2017  2:42 PM Admission Comments: No comment available   Primary Diagnosis:  <principal problem not specified> Principal Problem: <principal problem not specified>  Patient Active Problem List   Diagnosis Date Noted  . Hypernatremia   . Hypokalemia   . Follow-up exam   . Aphasia   . Hemiparesthesia   . Hemiparesis of right nondominant side as late effect of cerebral infarction ([place])   . Acute ischemic right MCA stroke ([place]) 06/20/2017  . Acute ischemic stroke ([place])   . Diabetes mellitus type 2 in obese ([place])   . Benign essential HTN   . Noncompliance   . FUO (fever of unknown origin)   . Leukocytosis   . Dysphagia, post-stroke   . TIA (transient ischemic attack) 06/15/2017  . Hyperlipidemia associated with type 2 diabetes mellitus ([place]) 06/14/2017  . Type 2 diabetes mellitus with neurological complications ([place]) 74/04/8785  . Stroke ([place]) 03/31/2017  . History of adenomatous polyp of colon 02/11/2017  . Dysphagia 11/19/2016  . Essential hypertension 08/12/2016  . Hemorrhoids 06/16/2016  . Rectal bleeding 06/16/2016  . Constipation 06/16/2016    Expected Discharge Date: Expected Discharge Date: 07/11/17  Team Members Present: Physician leading conference: Dr. Alysia Penna Social Worker Present: Ovidio Kin, LCSW Nurse Present: Junius Creamer, RN PT Present: Dwyane Dee, PT OT Present: Cherylynn Ridges, OT SLP Present: Weston Anna, SLP PPS Coordinator present : Daiva Nakayama, RN, CRRN     Current Status/Progress Goal Weekly Team Focus  Medical   Aphasic, urinary retention  requiring intermittent catheterizations, blood pressure stabilizing  Maintain medical stability, assure adequate fluid intake off IV fluids  Improve bladder function   Bowel/Bladder   Incontinent of B&B  Keep clean and dry.  Assess for toileting needs q shift and prn   Swallow/Nutrition/ Hydration   Dys. 1 textures with thin liquids with Provale restricted flow cup for 5cc bolus  min assist with least restrictive diet  Tolerance of curent diet, possible trials of Dys. 2 textures    ADL's   Max +2  Min/mod A overall  R NMR, sitting balance, transfers, attention, initiation, pt/family education   Mobility   max>+2 for all mobility  lofty mod/max assist  attention, functional moblity progression, sitting balance   Communication   Mod A for basic comprehension, Max-Total A for multimodal communication in regards to wants/needs  Mod A  basic auditory comprehension, MIT    Safety/Cognition/ Behavioral Observations  Max-Total A  min assist   attention    Pain   No c/o pain  <2.  Assess pain q 4hr and prn   Skin   CDI  Maintain skin integrity.  Assess q shift and prn      *See Care Plan and progress notes for long and short-term goals.     Barriers to Discharge  Current Status/Progress Possible Resolutions Date Resolved   Physician    Decreased caregiver support;Weight  Global aphasia obesity poor mobility urinary retention  Slow progress towards goals  Will need SNF placement      Nursing  PT                    OT                  SLP Inaccessible home environment;Decreased caregiver support;Lack of/limited family support;Medication compliance              SW                Discharge Planning/Teaching Needs:  Pt will be too much care for daughter in-law at this time will need to pursue NHP until family can manage at home      Team Discussion:  More alert and engaging, trying to answer yes/no does nod her head. Requiring I & O caths. Still max to total assist  and will require too much assist for family to provide, so will need to pursue NHP. R-hemi severe. Likes the radio and does well in therapies with it on.  Revisions to Treatment Plan:  NHP    Continued Need for Acute Rehabilitation Level of Care: The patient requires daily medical management by a physician with specialized training in physical medicine and rehabilitation for the following conditions: Daily direction of a multidisciplinary physical rehabilitation program to ensure safe treatment while eliciting the highest outcome that is of practical value to the patient.: Yes Daily medical management of patient stability for increased activity during participation in an intensive rehabilitation regime.: Yes Daily analysis of laboratory values and/or radiology reports with any subsequent need for medication adjustment of medical intervention for : Neurological problems;Other  Mellony Danziger, Gardiner Rhyme 07/07/2017, 2:11 PM

## 2017-07-06 NOTE — Progress Notes (Signed)
Subjective/Complaints:  No issues overnight, remains a phasic, discussed with CNA  ROS: limited due to language/communication   Objective: Vital Signs: Blood pressure 132/90, pulse 75, temperature 99 F (37.2 C), temperature source Oral, resp. rate 16, height _0  (1.651 m), weight 88 kg (194 lb 0.1 oz), SpO2 98 %.  Results for orders placed or performed during the hospital encounter of 06/20/17 (from the past 72 hour(s))  Glucose, capillary     Status: Abnormal   Collection Time: 07/03/17 12:05 PM  Result Value Ref Range   Glucose-Capillary 106 (H) 65 - 99 mg/dL  Glucose, capillary     Status: Abnormal   Collection Time: 07/03/17  5:10 PM  Result Value Ref Range   Glucose-Capillary 134 (H) 65 - 99 mg/dL  Glucose, capillary     Status: Abnormal   Collection Time: 07/03/17  8:53 PM  Result Value Ref Range   Glucose-Capillary 196 (H) 65 - 99 mg/dL  CBC     Status: Abnormal   Collection Time: 07/04/17  4:54 AM  Result Value Ref Range   WBC 10.8 (H) 4.0 - 10.5 K/uL   RBC 4.54 3.87 - 5.11 MIL/uL   Hemoglobin 12.8 12.0 - 15.0 g/dL   HCT 39.8 36.0 - 46.0 %   MCV 87.7 78.0 - 100.0 fL   MCH 28.2 26.0 - 34.0 pg   MCHC 32.2 30.0 - 36.0 g/dL   RDW 14.6 11.5 - 15.5 %   Platelets 402 (H) 150 - 400 K/uL    Comment: Performed at [facility], [place] [address], [place], [place] [address]  Basic metabolic panel     Status: Abnormal   Collection Time: 07/04/17  4:54 AM  Result Value Ref Range   Sodium 143 135 - 145 mmol/L   Potassium 3.6 3.5 - 5.1 mmol/L   Chloride 106 101 - 111 mmol/L   CO2 29 22 - 32 mmol/L   Glucose, Bld 124 (H) 65 - 99 mg/dL   BUN 16 6 - 20 mg/dL   Creatinine, Ser 0.72 0.44 - 1.00 mg/dL   Calcium 8.4 (L) 8.9 - 10.3 mg/dL   GFR calc non Af Amer >60 >60 mL/min   GFR calc Af Amer >60 >60 mL/min    Comment: (NOTE) The eGFR has been calculated using the CKD EPI equation. This calculation has not been validated in all clinical situations. eGFR's persistently  <60 mL/min signify possible Chronic Kidney Disease.    Anion gap 8 5 - 15    Comment: Performed at [facility] [address] [place], [place] [address]  Glucose, capillary     Status: None   Collection Time: 07/04/17  6:34 AM  Result Value Ref Range   Glucose-Capillary 98 65 - 99 mg/dL  Glucose, capillary     Status: Abnormal   Collection Time: 07/04/17 11:59 AM  Result Value Ref Range   Glucose-Capillary 178 (H) 65 - 99 mg/dL  Glucose, capillary     Status: Abnormal   Collection Time: 07/04/17  4:33 PM  Result Value Ref Range   Glucose-Capillary 190 (H) 65 - 99 mg/dL  Glucose, capillary     Status: Abnormal   Collection Time: 07/04/17  9:21 PM  Result Value Ref Range   Glucose-Capillary 168 (H) 65 - 99 mg/dL  Glucose, capillary     Status: Abnormal   Collection Time: 07/05/17  6:23 AM  Result Value Ref Range   Glucose-Capillary 174 (H) 65 - 99 mg/dL  Glucose, capillary     Status: Abnormal   Collection Time: 07/05/17 11:54 AM  Result Value Ref Range   Glucose-Capillary 152 (H) 65 - 99 mg/dL  Glucose, capillary     Status: Abnormal   Collection Time: 07/05/17  4:48 PM  Result Value Ref Range   Glucose-Capillary 173 (H) 65 - 99 mg/dL  Glucose, capillary     Status: Abnormal   Collection Time: 07/05/17  9:25 PM  Result Value Ref Range   Glucose-Capillary 152 (H) 65 - 99 mg/dL  Glucose, capillary     Status: Abnormal   Collection Time: 07/06/17  6:14 AM  Result Value Ref Range   Glucose-Capillary 102 (H) 65 - 99 mg/dL     HEENT: Normocephalic. Atraumatic. Cardio: RRR without murmur. No JVD .Marland Kitchen Resp: CTA Bilaterally without wheezes or rales. Normal effort   GI: BS positive and ND Musculoskeletal:  No tenderness and No Edema Skin:   Warm and dry. Intact. Neuro:  Awake, does not follow commands consistently Gen NAD. Vital signs reviewed.  Psych: Limited due to a aphasia but appears to be fairly upbeat  Assessment/Plan: 1. Functional deficits secondary to Left  MCA infarct with R HP, Aphasia which require 3+ hours per day of interdisciplinary therapy in a comprehensive inpatient rehab setting. Physiatrist is providing close team supervision and 24 hour management of active medical problems listed below. Physiatrist and rehab team continue to assess barriers to discharge/monitor patient progress toward functional and medical goals. FIM: Function - Bathing Position: Bed Body parts bathed by patient: Chest, Abdomen, Left upper leg Body parts bathed by helper: Right arm, Left arm, Front perineal area, Buttocks, Right upper leg, Right lower leg, Left lower leg, Back Assist Level: (max assist)  Function- Upper Body Dressing/Undressing What is the patient wearing?: Pull over shirt/dress Bra - Perfomed by helper: Thread/unthread right bra strap, Thread/unthread left bra strap, Hook/unhook bra (pull down sports bra) Pull over shirt/dress - Perfomed by patient: Thread/unthread left sleeve, Put head through opening Pull over shirt/dress - Perfomed by helper: Pull shirt over trunk, Thread/unthread right sleeve Assist Level: 2 helpers Function - Lower Body Dressing/Undressing What is the patient wearing?: Pants, Non-skid slipper socks Position: Bed Pants- Performed by patient: Thread/unthread left pants leg Pants- Performed by helper: Thread/unthread right pants leg, Pull pants up/down Non-skid slipper socks- Performed by helper: Don/doff right sock, Don/doff left sock Socks - Performed by helper: Don/doff right sock, Don/doff left sock Shoes - Performed by helper: Don/doff right shoe, Don/doff left shoe(slip on) Assist for footwear: Dependant Assist for lower body dressing: (Max A)  Function - Toileting Toileting activity did not occur: No continent bowel/bladder event Toileting steps completed by helper: Adjust clothing prior to toileting, Performs perineal hygiene, Adjust clothing after toileting Toileting Assistive Devices: Grab bar or rail Assist  level: Touching or steadying assistance (Pt.75%)  Function - Air cabin crew transfer activity did not occur: Safety/medical concerns Toilet transfer assistive device: Mechanical lift Mechanical lift: Stedy Assist level to toilet: 2 helpers Assist level from toilet: 2 helpers  Function - Chair/bed transfer Chair/bed transfer activity did not occur: Safety/medical concerns Chair/bed transfer method: Lateral scoot Chair/bed transfer assist level: 2 helpers Chair/bed transfer assistive device: Armrests, Sliding board Mechanical lift: Maximove Chair/bed transfer details: Manual facilitation for weight shifting, Verbal cues for precautions/safety, Tactile cues for posture, Tactile cues for placement, Tactile cues for weight shifting, Tactile cues for sequencing, Tactile cues for weight bearing  Function - Locomotion: Wheelchair Wheelchair activity did not occur:  Safety/medical concerns Assist Level: Maximal assistance (Pt 25 - 49%) Wheel 50 feet with 2 turns activity did not occur: Safety/medical concerns Assist Level: Maximal assistance (Pt 25 - 49%) Wheel 150 feet activity did not occur: Safety/medical concerns Function - Locomotion: Ambulation Ambulation activity did not occur: Safety/medical concerns Walk 10 feet activity did not occur: Safety/medical concerns Walk 50 feet with 2 turns activity did not occur: Safety/medical concerns Walk 150 feet activity did not occur: Safety/medical concerns Walk 10 feet on uneven surfaces activity did not occur: Safety/medical concerns  Function - Comprehension Comprehension: Auditory Comprehension assist level: Understands basic 25 - 49% of the time/ requires cueing 50 - 75% of the time  Function - Expression Expression: Verbal Expression assistive device: Other (Comment)(Nods.) Expression assist level: Expresses basis less than 25% of the time/requires cueing >75% of the time.  Function - Social Interaction Social Interaction  assist level: Interacts appropriately less than 25% of the time. May be withdrawn or combative.  Function - Problem Solving Problem solving assist level: Solves basic less than 25% of the time - needs direction nearly all the time or does not effectively solve problems and may need a restraint for safety  Function - Memory Memory assist level: Recognizes or recalls less than 25% of the time/requires cueing greater than 75% of the time Patient normally able to recall (first 3 days only): (unable to assess)  Medical Problem List and Plan: 1. Functional deficits secondary to  due to L-MCA stroke with extension   Aphasia with Right spastic hemiplegia   Cont CIR - PT, OT, SLP Team conference today please see physician documentation under team conference tab, met with team face-to-face to discuss problems,progress, and goals. Formulized individual treatment plan based on medical history, underlying problem and comorbidities. 2.  DVT Prophylaxis/Anticoagulation: Pharmaceutical: Lovenox 3. Pain Management: tylenol prn, knee pain RIght improving with Voltaren gel 4. Mood: LCSW to follow for evaluation when appropriate.  5. Neuropsych: This patient is not capable of making decisions on her own behalf. 6. Skin/Wound Care: Routine pressure relief measures. Maintain adequate nutritional and hydration status.  7. Fluids/Electrolytes/Nutrition: Monitor I/O.  8. T2DM: Hgb A1C- Monitor BS ac/hs and use SSI for elevated BS. Continue to titrate medications for tighter control.  CBG (last 3)  Recent Labs    07/05/17 1648 07/05/17 2125 07/06/17 0614  GLUCAP 173* 152* 102*    Lantus adjusted to 35 units on 2/19. 8U novalog with meals, CBG improved this morning    9. HTN: Improved control 2/16 Vitals:   07/05/17 1323 07/06/17 0155  BP: 137/66 132/90  Pulse: 70 75  Resp: 18 16  Temp: 97.8 F (36.6 C) 99 F (37.2 C)  SpO2: 97% 98%    Started amlodipine 2/6, Controlled 2/20     10. Fever: resolved    off Unasyn per ID. CXR neg , repeat urine neg, no DVT,recent ECHO no evidence of endocarditis no other symptoms  Hep A,B,C neg, HIV neg   Appreciate ID recs, signed off.    11. Dysphagia: Continue dysphagia 1, nectar liquids with assistance at meals to help maintain adequate hydration.  12. Recent episode of  Depression: Now onCelexa last month.   13. Obesity             Body mass index is 32.91 kg/m.             Diet and exercise education             Will cont to encourage  weight loss to increase endurance and promote overall health 14.  Hypokalemia    Supplemented x3 days   Potassium 3.2 on 2/11   Improved to 3.6 2/13, stable 2/18   Mag ordered 15. Leukocytosis: improving   WBCs 10.6 on 2/18  Afebrile   Cont to monitor   See below 16. Obesity   Body mass index is 32.28 kg/m.   Diet and exercise education   Encourage weight loss to increase endurance and promote overall health 17. Hypernatremia  Na+ 153 on 2/11   Likely dehydration   IVF started on 2/11   Improved to 143 2/18 18.   E. coli UTI which was resistant to Keflex.  Began Bactrim twice daily for 7 days 07/02/2017 Urinary retention, ICP add, flomax, urecholine LOS (Days) [place] E Kirsteins 07/06/2017, 10:20 AM

## 2017-07-06 NOTE — Progress Notes (Signed)
Occupational Therapy Session Note  Patient Details  Name: Shamell Hittle MRN: [id number] Date of Birth: 10-13-1940  Today's Date: 07/06/2017 OT Individual Time: 1003-1100 OT Individual Time Calculation (min): 57 min   Short Term Goals: Week 2:  OT Short Term Goal 1 (Week 2): Pt will transfer to w/c with MAX A of 1 in prep for [facility] transfer OT Short Term Goal 2 (Week 2): Pt will maintain static sitting balance for  5 min EOM/EOB with min A in prep for toileting OT Short Term Goal 3 (Week 2): Pt will consistently follow 2 step commands with mod cues within BADL task  Skilled Therapeutic Interventions/Progress Updates:    OT treatment session focused on LB/UB dressing, transfers, and problem solving. Pt incontinent of bowel upon OT arrival. Worked on bed mobility with rolling supervision to R and min A to L. Pt initiated threading LLE into pants. Max A to come to sitting EOB. Addressed sitting balance within task demand of donning shirt. Provided hand over hand A to L hand to bring attention to threading R sleeve first.  SB transfer to TIS wc on R side w/ max A of 2 helpers today. Pt with difficulty motor planning and needed facilitation for weight shifting to scoot. Pt provided wash cloth and washed face and hands at the sink with hand over hand to bring R UE to sink. Peg board activity using backwards chaining to follow simple alternating pattern. With OT beginning pattern and only providing pt with 2 colors. She was able to place 4/12 in correct places. R UE NMR with seated towel glides. Pt returned to room and left with seat belt on, tilted in wc and full lap tray in place.   Therapy Documentation Precautions:  Precautions Precautions: Fall Precaution Comments: dense R hemiparesis, R inattention Restrictions Weight Bearing Restrictions: No Other Position/Activity Restrictions: Positioning of right upper extremity in sight of patient and not pulling on it as it is flaccid.  Pain: Pain  Assessment Pain Assessment: No/denies pain  See Function Navigator for Current Functional Status.   Therapy/Group: Individual Therapy  Valma Cava 07/06/2017, 11:04 AM

## 2017-07-06 NOTE — Progress Notes (Signed)
Speech Language Pathology Daily Session Note  Patient Details  Name: Sandra [name] MRN: [id number] Date of Birth: 03-08-1941  Today's Date: 07/06/2017  Session 1: SLP Individual Time: 1130-1200 SLP Individual Time Calculation (min): 30 min   Session 2: SLP Individual Time: 1430-1500 SLP Individual Time Calculation (min): 30 min   Short Term Goals: Week 3: SLP Short Term Goal 1 (Week 3): Pt will answer yes/no immediate, personally relevant questions in 75% of opportunities with Mod A multimodal cues.   SLP Short Term Goal 2 (Week 3): Pt will follow 1 step commands for 75% accuracy during basic, familiar tasks with Mod A multimodal cues.   SLP Short Term Goal 3 (Week 3): Pt will sustain her attention to basic, familiar tasks for 5 minute intervals with Mod A verbal cues for redirection.   SLP Short Term Goal 4 (Week 3): Pt will produce phonemes in isolation with 50% accuracy with Max A multimodal cues. SLP Short Term Goal 5 (Week 3): Given Mod A multimodal cues, pt will select requested object from field of 2 in 75% of opportunities.  SLP Short Term Goal 6 (Week 3): Pt will consume dys 1 textures and thin liquids with mod verbal cues for use of swallowing precautions and minimal overt s/s of aspiration.    Skilled Therapeutic Interventions:   Session 1: Skilled treatment session focused on cognitive-linguistic goals. SLP facilitated session by providing Max A multimodal cues for problem solving and attention to RUE during hand washing.  SLP further facilitated session by providing Min A verbal cues to follow 1-step commands with 80% accuracy during a calendar making task. When presented with two numbers, pt required Max A verbal cues to select requested number with 100% accuracy. Pt attempted to verbalize automatic sequences with SLP; however, only number 1-3 was intelligible with 100% accuracy. Pt able to produce number "4" x1 and "5" x1. Pt left upright in wheelchair with belt on and all  needs within reach. Continue with current plan of care.    Session 2: Skilled treatment session focused on cognitive-linguistic goals. SLP facilitated session by providing Max A multimodal cues for intonation in unison using Melodic Intonation Therapy (MIT). Pt able to intonate her own name with 80% accuracy and "I don't know" with 40% accuracy. Pt spontaneously verbalized "I don't know what I'm singing. I don't know" with 60% intelligibility. SLP further facilitated session by providing Max A multimodal cues to verbalize phoneme /a/ with 80% accuracy and perform basic oral motor movements with 10% accuracy. Pt demonstrated problem solving for use of Provale cup with Min A verbal cues. Pt left supine in bed with alarm on and all needs within reach. Continue with current plan of care.       Function:  Cognition Comprehension Comprehension assist level: Understands basic 25 - 49% of the time/ requires cueing 50 - 75% of the time  Expression   Expression assist level: Expresses basis less than 25% of the time/requires cueing >75% of the time.  Social Interaction Social Interaction assist level: Interacts appropriately less than 25% of the time. May be withdrawn or combative.  Problem Solving Problem solving assist level: Solves basic less than 25% of the time - needs direction nearly all the time or does not effectively solve problems and may need a restraint for safety  Memory Memory assist level: Recognizes or recalls less than 25% of the time/requires cueing greater than 75% of the time    Pain Pain Assessment Pain Assessment: No/denies pain  Therapy/Group: Individual Therapy  Meredeth Ide  SLP - Student 07/06/2017, 12:20 PM

## 2017-07-06 NOTE — Progress Notes (Signed)
Physical Therapy Weekly Progress Note  Patient Details  Name: Sandra [name] MRN: [id number] Date of Birth: [birth year]  Beginning of progress report period: June 29, 2017 End of progress report period: July 06, 2017  Today's Date: 07/06/2017 PT Individual Time: 1300-1415 PT Individual Time Calculation (min): 75 min   Patient has met 1 of 3 short term goals.  Pt continues to make very slow progress with therapy.  When motivated by result she is able to perform mobility with significantly decreased assist (for example, when wanting to transfer back to bed, can do so with as little as mod assist, however when transferring out of bed or to therapy mat can require up to total +2 assist).  Patient continues to demonstrate the following deficits muscle weakness and muscle paralysis, impaired timing and sequencing, abnormal tone, unbalanced muscle activation and decreased coordination, decreased visual perceptual skills, decreased midline orientation and decreased attention to right, decreased initiation, decreased attention, decreased awareness, decreased problem solving, decreased safety awareness, decreased memory and delayed processing and decreased sitting balance, decreased standing balance, decreased postural control, hemiplegia and decreased balance strategies and therefore will continue to benefit from skilled PT intervention to increase functional independence with mobility.  Patient not progressing toward long term goals.  See goal revision..  Plan of care revisions: downgraded goals for transfers, discharged goals for locomotion.  Expected to reach min assist level for bed mobility, and +1 assist for transfers.  .  PT Short Term Goals Week 2:  PT Short Term Goal 1 (Week 2): Pt will transfer with slide board and +1 assist.   PT Short Term Goal 1 - Progress (Week 2): Met PT Short Term Goal 2 (Week 2): Pt will propel standard w/c 25' with mod assist PT Short Term Goal 2 - Progress (Week  2): Not met PT Short Term Goal 3 (Week 2): Pt will transition to standing with +1 assist and LRAD PT Short Term Goal 3 - Progress (Week 2): Not met Week 3:  PT Short Term Goal 1 (Week 3): =LTGs due to ELOS  Skilled Therapeutic Interventions/Progress Updates:    no indication of pain.  Session focus on transfers, initiation, attention, and postural control.  Pt requires total +2 to transfer to therapy mat with slide board with max multimodal cues for weight shifting, and head/hips relationship.  In sitting focus on static sitting balance/posture but pt unable to maintain upright posture even with total assist, seemingly resisting therapist facilitation and seemingly unwilling to attempt to correct with only verbal cues.  Eventually transitions to supine with mod assist, pt noted to be straining.  Requires multiple cues to indicate she needed to use the bathroom.  Max +1 for transfer back to w/c and mod +1 for transfer back to bed with slide board.  Pt incontinent of bowel.  Requires total assist for clothing management and hygiene.  Pt able to roll R with supervision and L with mod assist.  Attempted to engage pt in yes/no questions focus on following 1-step commands and accuracy of answers to improve communication, but pt keeping eyes closed.  RN in at end of session for I/O cath.   Therapy Documentation Precautions:  Precautions Precautions: Fall Precaution Comments: dense R hemiparesis, R inattention Restrictions Weight Bearing Restrictions: No Other Position/Activity Restrictions: Positioning of right upper extremity in sight of patient and not pulling on it as it is flaccid.    See Function Navigator for Current Functional Status.  Therapy/Group: Individual Therapy  Michel Santee 07/06/2017,  11:18 AM

## 2017-07-06 NOTE — Plan of Care (Signed)
  RH Attention LTG Patient will demonstrate focused/sustained (PT) Description LTG:  Patient will demonstrate focused/sustained/selective/alternating/divided attention during functional mobility in specific environment with assist for # of minutes  (PT) 07/06/2017 1611 by Michel Santee, PT Flowsheets Taken 07/06/2017 1611  LTG: Pt will demonstrate during functional mobility attention type of  Sustained (downgraded 2/20) LTG: Patient will demonstrate focused/sustained/selective/alternating/divided attention during functional mobility in Controlled environment LTG: Patient will demonstrate attention during functional mobility with assistance of 3-Moderate Assistance Note Downgraded due to slow pt progress   RH Balance LTG Patient will maintain dynamic sitting balance (PT) Description LTG:  Patient will maintain dynamic sitting balance with assistance during mobility activities (PT) 07/06/2017 1611 by Michel Santee, PT Flowsheets Taken 07/06/2017 1611  LTG: Pt will maintain dynamic sitting balance during mobility activities with: 3 - Moderate Assistance (downgraded 2/20) Note Downgraded due to slow pt progress   RH Bed to Chair Transfers LTG Patient will perform bed/chair transfers w/assist (PT) Description LTG: Patient will perform bed/chair transfers with assistance, with/without cues (PT). 07/06/2017 1611 by Michel Santee, PT Flowsheets Taken 07/06/2017 1611  LTG: Pt will perform Bed to Chair Transfers with/without cues and assist:  2 - Maximum Assistance (downgraded 2/20) Note Downgraded due to slow pt progress   RH Car Transfers LTG Patient will perform car transfers with assist (PT) Description LTG: Patient will perform car transfers with assistance (PT). 07/28/9700 [id number] - Not Applicable by Michel Santee, PT Flowsheets Taken 07/06/2017 1611  LTG: Pt will perform car transfers with assist: -- (d/c 2/20) Note D/C due to SNF placement at d/c   [facility]  Ambulation LTG Patient will ambulate in controlled environment (PT) Description LTG: Patient will ambulate in a controlled environment, # of feet with assistance (PT). 5/88/5027 [id number] - Not Applicable by Michel Santee, PT Flowsheets Taken 07/06/2017 1611  LTG: Pt will ambulate in controlled environ  assist needed: -- (d/c 2/20) Note D/c due to not a focus at this time   [facility] Wheelchair Mobility LTG Patient will propel w/c in controlled environment (PT) Description LTG: Patient will propel wheelchair in controlled environment, # of feet with assist (PT) 8/78/6767 [id number] - Not Applicable by Michel Santee, PT Flowsheets Taken 07/06/2017 1611  LTG: Pt will propel w/c in controlled environ  assist needed: -- (d/c 2/20) Note D/c as not a focus at this time LTG Patient will propel w/c in home environment (PT) Description LTG: Patient will propel wheelchair in home environment, # of feet with assistance (PT). 11/22/6281 [id number] - Not Applicable by Michel Santee, PT Flowsheets Taken 07/06/2017 1611  LTG: Pt will propel w/c in home environ  assist needed: -- (d/c 2/20) Note D/c due to not a focus at this time

## 2017-07-07 ENCOUNTER — Inpatient Hospital Stay (HOSPITAL_COMMUNITY): Payer: Medicare Other | Admitting: Occupational Therapy

## 2017-07-07 ENCOUNTER — Inpatient Hospital Stay (HOSPITAL_COMMUNITY): Payer: Medicare Other | Admitting: Speech Pathology

## 2017-07-07 ENCOUNTER — Inpatient Hospital Stay (HOSPITAL_COMMUNITY): Payer: Medicare Other | Admitting: Physical Therapy

## 2017-07-07 LAB — GLUCOSE, CAPILLARY
GLUCOSE-CAPILLARY: 193 mg/dL — AB (ref 65–99)
Glucose-Capillary: 89 mg/dL (ref 65–99)
Glucose-Capillary: 169 mg/dL — ABNORMAL HIGH (ref 65–99)
Glucose-Capillary: 247 mg/dL — ABNORMAL HIGH (ref 65–99)

## 2017-07-07 MED ORDER — BETHANECHOL CHLORIDE 10 MG PO TABS
10.0000 mg | ORAL_TABLET | Freq: Four times a day (QID) | ORAL | Status: DC
  Administered 2017-07-07 – 2017-07-10 (×15): 10 mg via ORAL
  Filled 2017-07-07 (×15): qty 1

## 2017-07-07 NOTE — Progress Notes (Signed)
Subjective/Complaints:  Patient working with speech therapy staff.  She exhibits apraxia during feeding.  Tries to eat off of the plate rather than use utensils  ROS: limited due to language/communication   Objective: Vital Signs: Blood pressure (!) 144/76, pulse 77, temperature 98.1 F (36.7 C), temperature source Oral, resp. rate 17, height 5\' 5"  (1.651 m), weight 88 kg (194 lb 0.1 oz), SpO2 99 %.  Results for orders placed or performed during the hospital encounter of 06/20/17 (from the past 72 hour(s))  Glucose, capillary     Status: Abnormal   Collection Time: 07/04/17 11:59 AM  Result Value Ref Range   Glucose-Capillary 178 (H) 65 - 99 mg/dL  Glucose, capillary     Status: Abnormal   Collection Time: 07/04/17  4:33 PM  Result Value Ref Range   Glucose-Capillary 190 (H) 65 - 99 mg/dL  Glucose, capillary     Status: Abnormal   Collection Time: 07/04/17  9:21 PM  Result Value Ref Range   Glucose-Capillary 168 (H) 65 - 99 mg/dL  Glucose, capillary     Status: Abnormal   Collection Time: 07/05/17  6:23 AM  Result Value Ref Range   Glucose-Capillary 174 (H) 65 - 99 mg/dL  Glucose, capillary     Status: Abnormal   Collection Time: 07/05/17 11:54 AM  Result Value Ref Range   Glucose-Capillary 152 (H) 65 - 99 mg/dL  Glucose, capillary     Status: Abnormal   Collection Time: 07/05/17  4:48 PM  Result Value Ref Range   Glucose-Capillary 173 (H) 65 - 99 mg/dL  Glucose, capillary     Status: Abnormal   Collection Time: 07/05/17  9:25 PM  Result Value Ref Range   Glucose-Capillary 152 (H) 65 - 99 mg/dL  Glucose, capillary     Status: Abnormal   Collection Time: 07/06/17  6:14 AM  Result Value Ref Range   Glucose-Capillary 102 (H) 65 - 99 mg/dL  Glucose, capillary     Status: Abnormal   Collection Time: 07/06/17 11:33 AM  Result Value Ref Range   Glucose-Capillary 171 (H) 65 - 99 mg/dL  Glucose, capillary     Status: None   Collection Time: 07/06/17  4:56 PM  Result Value  Ref Range   Glucose-Capillary 65 65 - 99 mg/dL  Glucose, capillary     Status: Abnormal   Collection Time: 07/06/17  5:53 PM  Result Value Ref Range   Glucose-Capillary 126 (H) 65 - 99 mg/dL  Glucose, capillary     Status: Abnormal   Collection Time: 07/06/17  8:43 PM  Result Value Ref Range   Glucose-Capillary 106 (H) 65 - 99 mg/dL  Glucose, capillary     Status: None   Collection Time: 07/07/17  6:51 AM  Result Value Ref Range   Glucose-Capillary 89 65 - 99 mg/dL     HEENT: Normocephalic. Atraumatic. Cardio: RRR without murmur. No JVD .Marland Kitchen Resp: CTA Bilaterally without wheezes or rales. Normal effort   GI: BS positive and ND Musculoskeletal:  No tenderness and No Edema Skin:   Warm and dry. Intact. Neuro:  Awake, does not follow commands consistently Gen NAD. Vital signs reviewed.  Psych: Limited due to a aphasia but appears to be fairly upbeat  Assessment/Plan: 1. Functional deficits secondary to Left MCA infarct with R HP, Aphasia which require 3+ hours per day of interdisciplinary therapy in a comprehensive inpatient rehab setting. Physiatrist is providing close team supervision and 24 hour management of active medical problems listed  below. Physiatrist and rehab team continue to assess barriers to discharge/monitor patient progress toward functional and medical goals. FIM: Function - Bathing Position: Bed Body parts bathed by patient: Chest, Abdomen, Left upper leg Body parts bathed by helper: Right arm, Left arm, Front perineal area, Buttocks, Right upper leg, Right lower leg, Left lower leg, Back Assist Level: (max assist)  Function- Upper Body Dressing/Undressing What is the patient wearing?: Pull over shirt/dress Bra - Perfomed by helper: Thread/unthread right bra strap, Thread/unthread left bra strap, Hook/unhook bra (pull down sports bra) Pull over shirt/dress - Perfomed by patient: Thread/unthread left sleeve, Put head through opening, Pull shirt over trunk Pull  over shirt/dress - Perfomed by helper: Pull shirt over trunk, Thread/unthread right sleeve Assist Level: Touching or steadying assistance(Pt > 75%) Function - Lower Body Dressing/Undressing What is the patient wearing?: Pants, Non-skid slipper socks Position: Bed Pants- Performed by patient: Thread/unthread left pants leg Pants- Performed by helper: Thread/unthread right pants leg, Pull pants up/down Non-skid slipper socks- Performed by helper: Don/doff right sock, Don/doff left sock Socks - Performed by helper: Don/doff right sock, Don/doff left sock Shoes - Performed by helper: Don/doff right shoe, Don/doff left shoe(slip on) Assist for footwear: Dependant Assist for lower body dressing: (Max A)  Function - Toileting Toileting activity did not occur: No continent bowel/bladder event Toileting steps completed by helper: Adjust clothing prior to toileting, Performs perineal hygiene, Adjust clothing after toileting Toileting Assistive Devices: Grab bar or rail Assist level: Touching or steadying assistance (Pt.75%)  Function - Air cabin crew transfer activity did not occur: Safety/medical concerns Toilet transfer assistive device: Facilities manager lift: Stedy Assist level to toilet: 2 helpers Assist level from toilet: 2 helpers  Function - Chair/bed transfer Chair/bed transfer activity did not occur: Safety/medical concerns Chair/bed transfer method: Lateral scoot Chair/bed transfer assist level: 2 helpers Chair/bed transfer assistive device: Armrests, Sliding board Mechanical lift: Maximove Chair/bed transfer details: Manual facilitation for weight shifting, Verbal cues for precautions/safety, Tactile cues for posture, Tactile cues for placement, Tactile cues for weight shifting, Tactile cues for sequencing, Tactile cues for weight bearing  Function - Locomotion: Wheelchair Wheelchair activity did not occur: Safety/medical concerns Assist Level: Maximal  assistance (Pt 25 - 49%) Wheel 50 feet with 2 turns activity did not occur: Safety/medical concerns Assist Level: Maximal assistance (Pt 25 - 49%) Wheel 150 feet activity did not occur: Safety/medical concerns Function - Locomotion: Ambulation Ambulation activity did not occur: Safety/medical concerns Walk 10 feet activity did not occur: Safety/medical concerns Walk 50 feet with 2 turns activity did not occur: Safety/medical concerns Walk 150 feet activity did not occur: Safety/medical concerns Walk 10 feet on uneven surfaces activity did not occur: Safety/medical concerns  Function - Comprehension Comprehension: Auditory Comprehension assist level: Understands basic 25 - 49% of the time/ requires cueing 50 - 75% of the time  Function - Expression Expression: Verbal Expression assistive device: Other (Comment)(Nods.) Expression assist level: Expresses basis less than 25% of the time/requires cueing >75% of the time.  Function - Social Interaction Social Interaction assist level: Interacts appropriately less than 25% of the time. May be withdrawn or combative.  Function - Problem Solving Problem solving assist level: Solves basic less than 25% of the time - needs direction nearly all the time or does not effectively solve problems and may need a restraint for safety  Function - Memory Memory assist level: Recognizes or recalls less than 25% of the time/requires cueing greater than 75% of the time Patient normally  able to recall (first 3 days only): (unable to assess)  Medical Problem List and Plan: 1. Functional deficits secondary to  due to L-MCA stroke with extension   Aphasia with Right spastic hemiplegia   Cont CIR - PT, OT, SLP Will likely need SNF placement. 2.  DVT Prophylaxis/Anticoagulation: Pharmaceutical: Lovenox 3. Pain Management: tylenol prn, knee pain RIght improving with Voltaren gel 4. Mood: LCSW to follow for evaluation when appropriate.  5. Neuropsych: This  patient is not capable of making decisions on her own behalf. 6. Skin/Wound Care: Routine pressure relief measures. Maintain adequate nutritional and hydration status.  7. Fluids/Electrolytes/Nutrition: Monitor I/O.  8. T2DM: Hgb A1C- Monitor BS ac/hs and use SSI for elevated BS. Continue to titrate medications for tighter control.  CBG (last 3)  Recent Labs    07/06/17 1753 07/06/17 2043 07/07/17 0651  GLUCAP 126* 106* 89    Lantus adjusted to 35 units on 2/19 8U novalog with meals, CBG controlled 07/07/2017    9. HTN: Improved control 2/16 Vitals:   07/07/17 0300 07/07/17 0826  BP: 138/73 (!) 144/76  Pulse: 77   Resp: 17   Temp: 98.1 F (36.7 C)   SpO2: 99%     Started amlodipine 2/6, Controlled 2/21     10. Fever: resolved   off Unasyn per ID. CXR neg , repeat urine neg, no DVT,recent ECHO no evidence of endocarditis no other symptoms  Hep A,B,C neg, HIV neg   Appreciate ID recs, signed off.    11. Dysphagia: Continue dysphagia 1, nectar liquids with assistance at meals to help maintain adequate hydration.  12. Recent episode of  Depression: Now onCelexa last month.   13. Obesity             Body mass index is 32.91 kg/m.             Diet and exercise education             Will cont to encourage weight loss to increase endurance and promote overall health 14.  Hypokalemia    Supplemented x3 days   Potassium 3.2 on 2/11   Improved to 3.6 2/13, stable 2/18   Mag ordered 15. Leukocytosis: improving   WBCs 10.6 on 2/18  Afebrile   Cont to monitor   See below 16. Obesity   Body mass index is 32.28 kg/m.   Diet and exercise education   Encourage weight loss to increase endurance and promote overall health 17. Hypernatremia  Na+ 153 on 2/11   Likely dehydration   IVF started on 2/11   Improved to 143 2/18 18.   E. coli UTI which was resistant to Keflex.  Began Bactrim twice daily for 7 days 07/02/2017 Urinary retention, ICP add, flomax, increase Urecholine to 10 mg 4  times daily LOS (Days) [place] EVALUATION WAS PERFORMED  Charlett Blake 07/07/2017, 8:49 AM

## 2017-07-07 NOTE — Progress Notes (Signed)
Social Work   Emillio Ngo, Eliezer Champagne  Social Worker  Physical Medicine and Rehabilitation  Patient Care Conference  Signed  Date of Service:  07/06/2017 12:33 PM          Signed          [] Hide copied text  [] Hover for details   Inpatient RehabilitationTeam Conference and Plan of Care Update Date: 07/06/2017   Time: 11:15 AM      Patient Name: Sandra [name]      Medical Record Number: [id number]  Date of Birth: Sep 23, 1940 Sex: Female         Room/Bed: 4W19C/4W19C-01 Payor Info: Payor: MEDICARE / Plan: MEDICARE PART A AND B / Product Type: *No Product type* /     Admitting Diagnosis: L CVA  Admit Date/Time:  06/20/2017  2:42 PM Admission Comments: No comment available    Primary Diagnosis:  <principal problem not specified> Principal Problem: <principal problem not specified>       Patient Active Problem List    Diagnosis Date Noted  . Hypernatremia    . Hypokalemia    . Follow-up exam    . Aphasia    . Hemiparesthesia    . Hemiparesis of right nondominant side as late effect of cerebral infarction ([place])    . Acute ischemic right MCA stroke ([place]) 06/20/2017  . Acute ischemic stroke ([place])    . Diabetes mellitus type 2 in obese ([place])    . Benign essential HTN    . Noncompliance    . FUO (fever of unknown origin)    . Leukocytosis    . Dysphagia, post-stroke    . TIA (transient ischemic attack) 06/15/2017  . Hyperlipidemia associated with type 2 diabetes mellitus ([place]) 06/14/2017  . Type 2 diabetes mellitus with neurological complications ([place]) 40/98/1191  . Stroke ([place]) 03/31/2017  . History of adenomatous polyp of colon 02/11/2017  . Dysphagia 11/19/2016  . Essential hypertension 08/12/2016  . Hemorrhoids 06/16/2016  . Rectal bleeding 06/16/2016  . Constipation 06/16/2016      Expected Discharge Date: Expected Discharge Date: 07/11/17   Team Members Present: Physician leading conference: Dr. Alysia Penna Social Worker Present: Ovidio Kin,  LCSW Nurse Present: Junius Creamer, RN PT Present: Dwyane Dee, PT OT Present: Cherylynn Ridges, OT SLP Present: Weston Anna, SLP PPS Coordinator present : Daiva Nakayama, RN, CRRN       Current Status/Progress Goal Weekly Team Focus  Medical     Aphasic, urinary retention requiring intermittent catheterizations, blood pressure stabilizing  Maintain medical stability, assure adequate fluid intake off IV fluids  Improve bladder function   Bowel/Bladder     Incontinent of B&B  Keep clean and dry.  Assess for toileting needs q shift and prn   Swallow/Nutrition/ Hydration     Dys. 1 textures with thin liquids with Provale restricted flow cup for 5cc bolus  min assist with least restrictive diet  Tolerance of curent diet, possible trials of Dys. 2 textures    ADL's     Max +2  Min/mod A overall  R NMR, sitting balance, transfers, attention, initiation, pt/family education   Mobility     max>+2 for all mobility  lofty mod/max assist  attention, functional moblity progression, sitting balance   Communication     Mod A for basic comprehension, Max-Total A for multimodal communication in regards to wants/needs  Mod A  basic auditory comprehension, MIT    Safety/Cognition/ Behavioral Observations   Max-Total A  min assist  attention    Pain     No c/o pain  <2.  Assess pain q 4hr and prn   Skin     CDI  Maintain skin integrity.  Assess q shift and prn     *See Care Plan and progress notes for long and short-term goals.      Barriers to Discharge   Current Status/Progress Possible Resolutions Date Resolved   Physician     Decreased caregiver support;Weight  Global aphasia obesity poor mobility urinary retention  Slow progress towards goals  Will need SNF placement      Nursing                 PT                    OT                 SLP Inaccessible home environment;Decreased caregiver support;Lack of/limited family support;Medication compliance            SW                Discharge Planning/Teaching Needs:  Pt will be too much care for daughter in-law at this time will need to pursue NHP until family can manage at home      Team Discussion:  More alert and engaging, trying to answer yes/no does nod her head. Requiring I & O caths. Still max to total assist and will require too much assist for family to provide, so will need to pursue NHP. R-hemi severe. Likes the radio and does well in therapies with it on.  Revisions to Treatment Plan:  NHP    Continued Need for Acute Rehabilitation Level of Care: The patient requires daily medical management by a physician with specialized training in physical medicine and rehabilitation for the following conditions: Daily direction of a multidisciplinary physical rehabilitation program to ensure safe treatment while eliciting the highest outcome that is of practical value to the patient.: Yes Daily medical management of patient stability for increased activity during participation in an intensive rehabilitation regime.: Yes Daily analysis of laboratory values and/or radiology reports with any subsequent need for medication adjustment of medical intervention for : Neurological problems;Other   Makayleigh Poliquin, Gardiner Rhyme 07/07/2017, 2:11 PM                  Harly Pipkins, Gardiner Rhyme, LCSW  Social Worker  Physical Medicine and Rehabilitation  Patient Care Conference  Signed  Date of Service:  06/29/2017  1:06 PM          Signed          [] Hide copied text  [] Hover for details   Inpatient RehabilitationTeam Conference and Plan of Care Update Date: 06/29/2017   Time: 11:15 AM      Patient Name: Sandra [name]      Medical Record Number: [id number]  Date of Birth: Mar 21, 1941 Sex: Female         Room/Bed: 4W19C/4W19C-01 Payor Info: Payor: MEDICARE / Plan: MEDICARE PART A AND B / Product Type: *No Product type* /     Admitting Diagnosis: L CVA  Admit Date/Time:  06/20/2017  2:42 PM Admission Comments: No comment  available    Primary Diagnosis:  <principal problem not specified> Principal Problem: <principal problem not specified>       Patient Active Problem List    Diagnosis Date Noted  . Hypernatremia    . Hypokalemia    . Follow-up exam    .  Aphasia    . Hemiparesthesia    . Hemiparesis of right nondominant side as late effect of cerebral infarction ([place])    . Acute ischemic right MCA stroke ([place]) 06/20/2017  . Acute ischemic stroke ([place])    . Diabetes mellitus type 2 in obese ([place])    . Benign essential HTN    . Noncompliance    . FUO (fever of unknown origin)    . Leukocytosis    . Dysphagia, post-stroke    . TIA (transient ischemic attack) 06/15/2017  . Hyperlipidemia associated with type 2 diabetes mellitus ([place]) 06/14/2017  . Type 2 diabetes mellitus with neurological complications ([place]) 93/81/0175  . Stroke ([place]) 03/31/2017  . History of adenomatous polyp of colon 02/11/2017  . Dysphagia 11/19/2016  . Essential hypertension 08/12/2016  . Hemorrhoids 06/16/2016  . Rectal bleeding 06/16/2016  . Constipation 06/16/2016      Expected Discharge Date: Expected Discharge Date: 07/11/17   Team Members Present: Physician leading conference: Dr. Alysia Penna Social Worker Present: Ovidio Kin, LCSW Nurse Present: Blair Heys, RN PT Present: Dwyane Dee, PT OT Present: Cherylynn Ridges, OT SLP Present: Weston Anna, SLP PPS Coordinator present : Daiva Nakayama, RN, CRRN       Current Status/Progress Goal Weekly Team Focus  Medical     A phasic, positive UA, blood pressures are fluctuating  Maintain medical stability, fluid electrolyte management  Check urine culture, correct electrolyte abnormalities   Bowel/Bladder     Incontinent of B&B.  Keep clean and dry.  Assess for toileting needs q shift and PRN.   Swallow/Nutrition/ Hydration     dysphagia 1 and thin liquids via Provale restricted flow cup for 5 cc bolus  min assist with least restrictive diet  completion  of Modified Barium Swallow Study -no appreciable progress   ADL's     Max/total +2  Min/mod A overall  R NMR, sitting balance, transfer training, attention, initiation, following commands   Mobility     +2 for all mobility, minimal initiation, decreased attention  lofty mod/max assist  attention, visual scanning, functional mobility progressing    Communication     nonverbal, Max A to Total A to follow 1 step commands, yes/no questions  Mod A  selection of requested object from field of 2, vocalization   Safety/Cognition/ Behavioral Observations   Max A to Total A     sustained attention for ~1 minute, task initiation   Pain     No s/s pain.  <2.  Assess q shift and PRN and treat as needed.   Skin     No complications.  Maintain skin integrity.  Assess q shift and PRN.     *See Care Plan and progress notes for long and short-term goals.      Barriers to Discharge   Current Status/Progress Possible Resolutions Date Resolved   Physician     Inaccessible home environment;Medical stability;Weight  Globally aphasia, obesity, heavy assist level  Continue rehab program  Manage medical complications      Nursing                 PT                    OT                 SLP Inaccessible home environment;Decreased caregiver support;Lack of/limited family support;Medication compliance            SW  Discharge Planning/Teaching Needs:  Family wants to take home as long as daughter in-law can provide the care she requires. She is here daily and encourages her in therapies.      Team Discussion:  Goals mod level, currently plus 2 total assist. MD checking UA. Difficulty following commands-attempting to verbalize. More alert in therapies. MBS-Dys 1 thin with 5 cc cup for small sips. Unsure if family can provide amount of care pt will require upon discharge from rehab. May need to pursue NHP  Revisions to Treatment Plan:  DC 2/25 versus NHP    Continued Need for Acute  Rehabilitation Level of Care: The patient requires daily medical management by a physician with specialized training in physical medicine and rehabilitation for the following conditions: Daily direction of a multidisciplinary physical rehabilitation program to ensure safe treatment while eliciting the highest outcome that is of practical value to the patient.: Yes Daily medical management of patient stability for increased activity during participation in an intensive rehabilitation regime.: Yes Daily analysis of laboratory values and/or radiology reports with any subsequent need for medication adjustment of medical intervention for : Neurological problems;Other   Danira Nylander, Gardiner Rhyme 06/29/2017, 1:06 PM                  Carlethia Mesquita, Gardiner Rhyme, LCSW  Social Worker  Physical Medicine and Rehabilitation  Patient Care Conference  Signed  Date of Service:  06/22/2017  1:06 PM          Signed          [] Hide copied text  [] Hover for details   Inpatient RehabilitationTeam Conference and Plan of Care Update Date: 06/22/2017   Time: 11:00 Am      Patient Name: Sandra [name]      Medical Record Number: [id number]  Date of Birth: 05-21-1940 Sex: Female         Room/Bed: 4W19C/4W19C-01 Payor Info: Payor: MEDICARE / Plan: MEDICARE PART A AND B / Product Type: *No Product type* /     Admitting Diagnosis: L CVA  Admit Date/Time:  06/20/2017  2:42 PM Admission Comments: No comment available    Primary Diagnosis:  <principal problem not specified> Principal Problem: <principal problem not specified>       Patient Active Problem List    Diagnosis Date Noted  . 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 Acute ischemic stroke ([place])    . Diabetes mellitus type 2 in obese ([place])    . Benign essential HTN    . Noncompliance    . FUO (fever of unknown origin)    . Leukocytosis    . Dysphagia, post-stroke    . TIA (transient ischemic attack) 06/15/2017  . Hyperlipidemia  associated with type 2 diabetes mellitus ([place]) 06/14/2017  . Type 2 diabetes mellitus with neurological complications ([place]) 27/25/3664  . Stroke ([place]) 03/31/2017  . History of adenomatous polyp of colon 02/11/2017  . Dysphagia 11/19/2016  . Essential hypertension 08/12/2016  . Hemorrhoids 06/16/2016  . Rectal bleeding 06/16/2016  . Constipation 06/16/2016      Expected Discharge Date:     Team Members Present: Physician leading conference: Dr. Alysia Penna Social Worker Present: Ovidio Kin, LCSW Nurse Present: Brita Romp, RN PT Present: Dwyane Dee, PT OT Present: Cherylynn Ridges, OT SLP Present: Weston Anna, SLP PPS Coordinator present : Daiva Nakayama, RN, CRRN       Current Status/Progress Goal Weekly Team Focus  Medical     Fever of unknown origin, global  aphasic, D1 nectar with poor appetite  reduce aspiration risk, ensure adequate nutrition  work up of FUO   Bowel/Bladder     Incontinent of B&B.  Keep clean and dry.  Assess toileting needs q shift and PRN.   Swallow/Nutrition/ Hydration     dys 1, nectar thick liquids; full staff supervision   min assist   trials of thin liquids, strategies to contain boluses and clear oral residue    ADL's     Total A +2   min/Mod A overall  R NMR, sitting balance/tolerance, transfer training, attention, initiation    Mobility     +2 total for all mobility, mechanical lifts for transfers  lofty mod/max assist  arousal, attention, visual scanning, functional mobility as able   Communication     nonverbal, max to total assist to follow commands, answer yes/no questions  mod assist   auditory comprehension (ie yes/no questions, following commands), vocalization   Safety/Cognition/ Behavioral Observations   max to total assist   min assist   scanning to the right of midline, sustained attention to basic, familiar tasks    Pain     No s/s of pain.  <2.  Assess q shift and PRN for pain.   Skin     No complications.   Maintain skin integrity.  Assess skin q shift and PRN.     *See Care Plan and progress notes for long and short-term goals.      Barriers to Discharge   Current Status/Progress Possible Resolutions Date Resolved   Physician     Inaccessible home environment;Medical stability;Incontinence;Wound Care  +2 total A with severe global aphasia  initiating rehab program  ID consult for FUO      Nursing                 PT  Inaccessible home environment;Decreased caregiver support;Incontinence;Insurance for SNF coverage                 OT Inaccessible home environment;Decreased caregiver support;Home environment access/layout;Weight  (4 stairs to enter house with no railing)             SLP            SW Decreased caregiver support [name] is of small stature and pt is a large woman             Discharge Planning/Teaching Needs:  Family wants to take home but daughter in-law is small and can not provide much physical assist. Son is a PT at [facility]. Await progress here.      Team Discussion:  Goals set for min/mod level of assist. Currently plus 2 total assist. Consulted ID to evaluate regarding higher white count. Checking UA again. Poor iniatiation. Incontinent B & B. Dye 1 nectar trials of thin. Poor po intake. Maybe set discharge target date next week. Will be much care for family.  Revisions to Treatment Plan:  New eval set target discharge date next week    Continued Need for Acute Rehabilitation Level of Care: The patient requires daily medical management by a physician with specialized training in physical medicine and rehabilitation for the following conditions: Daily direction of a multidisciplinary physical rehabilitation program to ensure safe treatment while eliciting the highest outcome that is of practical value to the patient.: Yes Daily medical management of patient stability for increased activity during participation in an intensive rehabilitation regime.: Yes Daily  analysis of laboratory values and/or radiology reports with any  subsequent need for medication adjustment of medical intervention for : Other;Neurological problems   Elease Hashimoto 06/22/2017, 1:06 PM                 Patient ID: Maydelin Deming, female   DOB: [birth year], 78 y.o.   MRN: [id number]

## 2017-07-07 NOTE — Progress Notes (Signed)
Physical Therapy Session Note  Patient Details  Name: Sandra [name] MRN: [id number] Date of Birth: 03-Apr-1941  Today's Date: 07/07/2017 PT Individual Time: 1000-1100 PT Individual Time Calculation (min): 60 min   Short Term Goals: Week 3:  PT Short Term Goal 1 (Week 3): =LTGs due to ELOS  Skilled Therapeutic Interventions/Progress Updates:    no c/o pain based on faces scale.  Session focus on weight bearing and postural control in standing on tilt table and initiation/attention.    Harrel Lemon used to transfer pt from w/c<>tilt table.  Positioned for equal weight bearing and pt able to tolerate ~7 minutes +~15 minutes in standing frame with support around upper abdomen (underneath breasts) for improved comfort.  During first trial focus on midline and R knee extension.  Pt able to activate R quads <10% of the time despite max multimodal cues.  On second trial pt worked on table top puzzle with focus on initiation, problem solving, attention, and visual scanning.  Pt able to complete 3 sides of her puzzle when presented with all of the pieces from 1 side at a time (e.g. All of the R edge pieces).  Pt returned to room at end of session at end of session and positioned in w/c with call bell in reach and needs met.  Refused QRB.   Therapy Documentation Precautions:  Precautions Precautions: Fall Precaution Comments: dense R hemiparesis, R inattention Restrictions Weight Bearing Restrictions: No Other Position/Activity Restrictions: Positioning of right upper extremity in sight of patient and not pulling on it as it is flaccid.    See Function Navigator for Current Functional Status.   Therapy/Group: Individual Therapy  Michel Santee 07/07/2017, 3:43 PM

## 2017-07-07 NOTE — Progress Notes (Signed)
Social Work Patient ID: Sandra [name], female   DOB: 1941-01-10, 77 y.o.   MRN: [id number]  Met with pt, daughter in-law and sister to discuss team conference progress this week and MD feeling it is time to start NH search. Family is hopeful she can go to [facility] since son is a PT in the hospital part. Will begin NH process.

## 2017-07-07 NOTE — Progress Notes (Signed)
Speech Language Pathology Daily Session Note  Patient Details  Name: Sandra [name] MRN: [id number] Date of Birth: 07/05/1940  Today's Date: 07/07/2017  Session 1: SLP Individual Time: 0730-0800 SLP Individual Time Calculation (min): 30 min   Session 2: SLP Individual Time: 1130-1200 SLP Individual Time Calculation (min): 30 min   Short Term Goals: Week 3: SLP Short Term Goal 1 (Week 3): Pt will answer yes/no immediate, personally relevant questions in 75% of opportunities with Mod A multimodal cues.   SLP Short Term Goal 2 (Week 3): Pt will follow 1 step commands for 75% accuracy during basic, familiar tasks with Mod A multimodal cues.   SLP Short Term Goal 3 (Week 3): Pt will sustain her attention to basic, familiar tasks for 5 minute intervals with Mod A verbal cues for redirection.   SLP Short Term Goal 4 (Week 3): Pt will produce phonemes in isolation with 50% accuracy with Max A multimodal cues. SLP Short Term Goal 5 (Week 3): Given Mod A multimodal cues, pt will select requested object from field of 2 in 75% of opportunities.  SLP Short Term Goal 6 (Week 3): Pt will consume dys 1 textures and thin liquids with mod verbal cues for use of swallowing precautions and minimal overt s/s of aspiration.     Skilled Therapeutic Interventions:  Session 1: Skilled treatment session focused on cognitive-linguistic goals. SLP facilitated session by providing Max A verbal cues for problem solving during self-feeding due to apraxia. Pt consumed dys. 1 textures without overt s/s of aspiration; however, pt with wet vocal quality x1 during consumption of 8 oz. thin liquids via Provale cup. Throughout session, pt spontaneously verbalized "okay" x3 and answered yes/no questions in regards to wants/needs for tray set-up with 100% accuracy. Pt left supine in bed with alarm on and all needs within reach. Continue with current plan of care.   Session 2: Skilled treatment session focused on dysphagia and  communication goals. SLP facilitated session by providing Max A multimodal cues for intonation in unison using Melodic Intonation Therapy (MIT). Pt able to intonate her own name with 90% accuracy with faded cues, "good morning" with 40% accuracy in unison, "baby" with 60% accuracy in unison, and "1, 2, 3" with 90% accuracy with faded cues. Throughout session, pt spontaneously verbalized "okay" x1 and "no" x1. SLP further facilitated session by providing skilled observation of pt consuming dys. 2 textures within a mixed consistency that was brought from home by a family member. Pt consumed trials without overt s/s of aspiration; however, pt demonstrated what appeared to be delayed swallow initiation of thin liquids while orally holding the dys. 2 textures that required prolonged mastication. Therefore, recommend pt continue current diet with trials from SLP only. Educated family member in regards to current recommendations, they verbalized understanding. Pt left with family member for transfer back to room. Continue with current plan of care.     Function:  Eating Eating   Modified Consistency Diet: Yes Eating Assist Level: Set up assist for;Helper scoops food on utensil;Help with picking up utensils;Help managing cup/glass;Supervision or verbal cues   Eating Set Up Assist For: Opening containers       Cognition Comprehension Comprehension assist level: Understands basic 25 - 49% of the time/ requires cueing 50 - 75% of the time  Expression   Expression assist level: Expresses basis less than 25% of the time/requires cueing >75% of the time.  Social Interaction Social Interaction assist level: Interacts appropriately less than 25% of the time.  May be withdrawn or combative.  Problem Solving Problem solving assist level: Solves basic less than 25% of the time - needs direction nearly all the time or does not effectively solve problems and may need a restraint for safety  Memory Memory assist level:  Recognizes or recalls less than 25% of the time/requires cueing greater than 75% of the time    Pain Pain Assessment Pain Assessment: No/denies pain  Therapy/Group: Individual Therapy  Meredeth Ide  SLP - Student 07/07/2017, 3:30 PM

## 2017-07-07 NOTE — Progress Notes (Signed)
Occupational Therapy Weekly Progress Note  Patient Details  Name: Sandra [name] MRN: [id number] Date of Birth: 04-27-1941  Beginning of progress report period: June 21, 2017 End of progress report period: July 07, 2017  Today's Date: 07/07/2017 Session 1 OT Individual Time: 0900-1000 OT Individual Time Calculation (min): 60 min   Session 2 OT Individual Time: 5361-4431 OT Individual Time Calculation (min): 27 min    Patient has met 2 of 3 short term goals.  Pt is making some progress towards OT goals this week. Pt has demonstrated improved ability to follow 1 step commands, improved sitting balance, and can transfer with 1 person Max A using slide-board. Pt's transfers however are still not consistent and she will occasionally need +2 assist. Pt does better within familiar ADL task of initiating and sequencing.   Patient continues to demonstrate the following deficits: muscle weakness and muscle joint tightness, impaired timing and sequencing, abnormal tone, unbalanced muscle activation, motor apraxia, decreased coordination and decreased motor planning, decreased midline orientation, decreased attention to right and decreased motor planning, decreased initiation, decreased attention, decreased awareness, decreased problem solving, decreased safety awareness, decreased memory and delayed processing and decreased sitting balance, decreased standing balance, decreased postural control, hemiplegia and decreased balance strategies and therefore will continue to benefit from skilled OT intervention to enhance overall performance with Reduce care partner burden.  Patient progressing toward long term goals..  Continue plan of care.  OT Short Term Goals Week 2:  OT Short Term Goal 1 (Week 2): Pt will transfer to w/c with MAX A of 1 in prep for [facility] transfer OT Short Term Goal 1 - Progress (Week 2): Met OT Short Term Goal 2 (Week 2): Pt will maintain static sitting balance for  5 min  EOM/EOB with min A in prep for toileting OT Short Term Goal 2 - Progress (Week 2): Met OT Short Term Goal 3 (Week 2): Pt will consistently follow 2 step commands with mod cues within BADL task OT Short Term Goal 3 - Progress (Week 2): Met Week 3:  OT Short Term Goal 1 (Week 3): STG=LTG 2/2 ELOS  Skilled Therapeutic Interventions/Progress Updates:    Pt greeted in bed incontinent of small BM. Rolling L and R in bed with min A overall for total A for peri-care and brief change. Pt came to sitting EOB with max A. Worked on sitting balance with reaching activity sitting unsupported at EOB. Slideboard transfer bed>TIS wc with Max A of 1 helper using stool under feet 2/2 pt's height. R attention and R NMR using hand over hand to integrate R UE into UB bathing/dressing task. Pt with improved shoulder activation with some elbow activation noted today. Pt able to utilize brush appropriately with L hand and then tried to transfer brush to R hand so OT provided hand over hand A for R grasp on brush, then pt able to try to push with proximal shoulder strength. Pt left seated in TIS wc at end of session with safety belt on and needs met.   Session 2 Slideboard transfer to TIS wc with max A +2 with improved initiation and weight shift to L side. Pt brought to day room and complete 4 sit<>stands with mod/max A +2. Traded out [employer] for comfort and pt left tilted in wc at nurses station.   Therapy Documentation Precautions:  Precautions Precautions: Fall Precaution Comments: dense R hemiparesis, R inattention Restrictions Weight Bearing Restrictions: No Other Position/Activity Restrictions: Positioning of right upper extremity in sight  of patient and not pulling on it as it is flaccid.   See Function Navigator for Current Functional Status.   Therapy/Group: Individual Therapy  Valma Cava 07/07/2017, 3:49 PM

## 2017-07-08 ENCOUNTER — Inpatient Hospital Stay (HOSPITAL_COMMUNITY): Payer: Medicare Other | Admitting: Speech Pathology

## 2017-07-08 ENCOUNTER — Inpatient Hospital Stay (HOSPITAL_COMMUNITY): Payer: Medicare Other | Admitting: Physical Therapy

## 2017-07-08 ENCOUNTER — Inpatient Hospital Stay (HOSPITAL_COMMUNITY): Payer: Medicare Other | Admitting: Occupational Therapy

## 2017-07-08 LAB — GLUCOSE, CAPILLARY
GLUCOSE-CAPILLARY: 59 mg/dL — AB (ref 65–99)
GLUCOSE-CAPILLARY: 86 mg/dL (ref 65–99)
Glucose-Capillary: 92 mg/dL (ref 65–99)
Glucose-Capillary: 164 mg/dL — ABNORMAL HIGH (ref 65–99)
Glucose-Capillary: 129 mg/dL — ABNORMAL HIGH (ref 65–99)
Glucose-Capillary: 58 mg/dL — ABNORMAL LOW (ref 65–99)

## 2017-07-08 NOTE — Progress Notes (Signed)
Speech Language Pathology Daily Session Note  Patient Details  Name: Sandra [name] MRN: [id number] Date of Birth: Mar 02, 1941  Today's Date: 07/08/2017  Session 1: SLP Individual Time: 0830-0900 SLP Individual Time Calculation (min): 30 min  Session 2: SLP Individual Time: 4098-1191 SLP Individual Time Calculation (min): 24 min  Short Term Goals: Week 3: SLP Short Term Goal 1 (Week 3): Pt will answer yes/no immediate, personally relevant questions in 75% of opportunities with Mod A multimodal cues.   SLP Short Term Goal 2 (Week 3): Pt will follow 1 step commands for 75% accuracy during basic, familiar tasks with Mod A multimodal cues.   SLP Short Term Goal 3 (Week 3): Pt will sustain her attention to basic, familiar tasks for 5 minute intervals with Mod A verbal cues for redirection.   SLP Short Term Goal 4 (Week 3): Pt will produce phonemes in isolation with 50% accuracy with Max A multimodal cues. SLP Short Term Goal 5 (Week 3): Given Mod A multimodal cues, pt will select requested object from field of 2 in 75% of opportunities.  SLP Short Term Goal 6 (Week 3): Pt will consume dys 1 textures and thin liquids with mod verbal cues for use of swallowing precautions and minimal overt s/s of aspiration.    Skilled Therapeutic Interventions:  Session 1: Skilled treatment session focused on communication goals. SLP facilitated session by providing Max multimodal cues to follow 1-step commands involving body parts with 70% accuracy. SLP further facilitated session by providing Max A multimodal cues for intonation in unison using Melodic Intonation Therapy (MIT). Pt able to intonate her own name with faded cues with 95% accuracy and "1, 2, 3, 4" with faded cues with 80% accuracy. Pt left supine in bed with alarm on and all needs within reach. Continue with current plan of care.   Session 2: Skilled treatment session focused on communication goals. SLP facilitated session by providing Mod - Max A  verbal cues to select requested color from field of 2 with 80% accuracy. Pt sustained her attention to the task for 3 minute intervals with Mod A verbal cues for redirection. Pt needed rest break with drink via [employer] cup in order to re-engage in task. Pt appeared lethargic and was not able to participate in task towards the end of session. Pt left upright in wheelchair with belt in place and all needs within reach. Continue with current plan of care.     Function:  Cognition Comprehension Comprehension assist level: Understands basic less than 25% of the time/ requires cueing >75% of the time  Expression   Expression assist level: Expresses basis less than 25% of the time/requires cueing >75% of the time.  Social Interaction Social Interaction assist level: Interacts appropriately less than 25% of the time. May be withdrawn or combative.  Problem Solving Problem solving assist level: Solves basic less than 25% of the time - needs direction nearly all the time or does not effectively solve problems and may need a restraint for safety  Memory Memory assist level: Recognizes or recalls less than 25% of the time/requires cueing greater than 75% of the time    Pain Pain Assessment Pain Assessment: No/denies pain   Therapy/Group: Individual Therapy  Meredeth Ide  SLP - Student 07/08/2017, 12:15 PM

## 2017-07-08 NOTE — Progress Notes (Signed)
Physical Therapy Session Note  Patient Details  Name: Sandra [name] MRN: [id number] Date of Birth: 1940-10-01  Today's Date: 07/08/2017 PT Individual Time: 0900-1000 PT Individual Time Calculation (min): 60 min   Short Term Goals: Week 3:  PT Short Term Goal 1 (Week 3): =LTGs due to ELOS  Skilled Therapeutic Interventions/Progress Updates:    no c/o pain.  Session focus on attention, initiation, communication, and functional mobility.   Pt completed bathing at bed level today focus on initiation, attention and R NMR with assist noted in function navigator.  Rolling R with supervision and L with min assist and mod verbal cues.  HOB maximally elevated and pt able to achieve modified circle sit for washing LEs today.  Supine>sitting EOB with HOB maximally elevated and mod assist to bring trunk forward.  Sit<>stand x2 in stedy from elevated bed with max assist.  Pt requires mod/max multimodal cues for upright posture and midline orientation in stedy.  Transfer to w/c with +1 and stedy.  Positioned tilted back with call bell in reach, refused QRB.  Rn aware.    Therapy Documentation Precautions:  Precautions Precautions: Fall Precaution Comments: dense R hemiparesis, R inattention Restrictions Weight Bearing Restrictions: No Other Position/Activity Restrictions: Positioning of right upper extremity in sight of patient and not pulling on it as it is flaccid.    See Function Navigator for Current Functional Status.   Therapy/Group: Individual Therapy  Michel Santee 07/08/2017, 11:23 AM

## 2017-07-08 NOTE — Progress Notes (Signed)
Occupational Therapy Session Note  Patient Details  Name: Sandra [name] MRN: [id number] Date of Birth: [birth year]  Today's Date: 07/08/2017  Session 1 OT Individual Time: 6716-4089 OT Individual Time Calculation (min): 38 min   Session 2 OT Individual Time: 0975-2955 OT Individual Time Calculation (min): 26 min    Short Term Goals: Week 3:  OT Short Term Goal 1 (Week 3): STG=LTG 2/2 ELOS  Skilled Therapeutic Interventions/Progress Updates:    Session 1 OT treatment session focused on toilet transfers, attention, and initiation. Stedy transfer to toilet with Max A +2. Tried to engage pt in saying "bathroom" to help associate toilet transfer. Pt unable to void in commode. Max+2 sit<>stand from commode with difficulty achieving hip/trunk extension. Pt stood 2x more with Max facilitation at hips/trunk while +2 assisted with pulling up brief. Pt brought to [employer] and worked on [employer] activity.  OT started activity with hand over hand A to provide demonstration, then  Pt able to hit 9 lights in a row in quiet environment and with lights off. Pt would begin perseverating and loose attention after 8-9 lights. Completed 2 more x with rest breaks in between. Pt left seated in wc at end of session with needs met and call bell in reach.    Session 2 OT treatment session focused on R UE NMR. Pt brought into gravity eliminated side-lying position in bed with mod A to roll L. Provided joint input to wrist and elbow to bring pt through full ROM. Worked on scap mobilization, shoulder flex/ext, elbow flex/ext, and forearm pronation/supination. Brought bed into trendelenburg and pt able to assist with pulling up in bed using L hand. Pt left semi-reclined in bed with needs met, bed alarm on, and call bell in reach.   Therapy Documentation Precautions:  Precautions Precautions: Fall Precaution Comments: dense R hemiparesis, R inattention Restrictions Weight Bearing Restrictions: No Other  Position/Activity Restrictions: Positioning of right upper extremity in sight of patient and not pulling on it as it is flaccid.   See Function Navigator for Current Functional Status.   Therapy/Group: Individual Therapy  Valma Cava 07/08/2017, 3:29 PM

## 2017-07-08 NOTE — Plan of Care (Signed)
  RH BLADDER ELIMINATION RH STG MANAGE BLADDER WITH ASSISTANCE Description STG Manage Bladder With max Assistance  07/08/2017 1348 - Progressing by Dietrich Pates, RN   RH SKIN INTEGRITY RH STG SKIN FREE OF INFECTION/BREAKDOWN Description  No new breakdown with mod assist   07/08/2017 1348 - Progressing by Dietrich Pates, RN   RH SAFETY RH STG ADHERE TO SAFETY PRECAUTIONS W/ASSISTANCE/DEVICE Description STG Adhere to Safety Precautions With Max Assistance/Device.  07/08/2017 1348 - Progressing by Dietrich Pates, RN

## 2017-07-08 NOTE — Progress Notes (Signed)
Subjective/Complaints:  There is more alert tries to say good morning  ROS: limited due to language/communication   Objective: Vital Signs: Blood pressure 134/69, pulse 76, temperature 98.1 F (36.7 C), temperature source Oral, resp. rate 18, height 5\' 5"  (1.651 m), weight 88 kg (194 lb 0.1 oz), SpO2 98 %.  Results for orders placed or performed during the hospital encounter of 06/20/17 (from the past 72 hour(s))  Glucose, capillary     Status: Abnormal   Collection Time: 07/05/17 11:54 AM  Result Value Ref Range   Glucose-Capillary 152 (H) 65 - 99 mg/dL  Glucose, capillary     Status: Abnormal   Collection Time: 07/05/17  4:48 PM  Result Value Ref Range   Glucose-Capillary 173 (H) 65 - 99 mg/dL  Glucose, capillary     Status: Abnormal   Collection Time: 07/05/17  9:25 PM  Result Value Ref Range   Glucose-Capillary 152 (H) 65 - 99 mg/dL  Glucose, capillary     Status: Abnormal   Collection Time: 07/06/17  6:14 AM  Result Value Ref Range   Glucose-Capillary 102 (H) 65 - 99 mg/dL  Glucose, capillary     Status: Abnormal   Collection Time: 07/06/17 11:33 AM  Result Value Ref Range   Glucose-Capillary 171 (H) 65 - 99 mg/dL  Glucose, capillary     Status: None   Collection Time: 07/06/17  4:56 PM  Result Value Ref Range   Glucose-Capillary 65 65 - 99 mg/dL  Glucose, capillary     Status: Abnormal   Collection Time: 07/06/17  5:53 PM  Result Value Ref Range   Glucose-Capillary 126 (H) 65 - 99 mg/dL  Glucose, capillary     Status: Abnormal   Collection Time: 07/06/17  8:43 PM  Result Value Ref Range   Glucose-Capillary 106 (H) 65 - 99 mg/dL  Glucose, capillary     Status: None   Collection Time: 07/07/17  6:51 AM  Result Value Ref Range   Glucose-Capillary 89 65 - 99 mg/dL  Glucose, capillary     Status: Abnormal   Collection Time: 07/07/17 11:29 AM  Result Value Ref Range   Glucose-Capillary 169 (H) 65 - 99 mg/dL  Glucose, capillary     Status: Abnormal   Collection  Time: 07/07/17  4:48 PM  Result Value Ref Range   Glucose-Capillary 247 (H) 65 - 99 mg/dL  Glucose, capillary     Status: Abnormal   Collection Time: 07/07/17  9:34 PM  Result Value Ref Range   Glucose-Capillary 193 (H) 65 - 99 mg/dL  Glucose, capillary     Status: None   Collection Time: 07/08/17  6:44 AM  Result Value Ref Range   Glucose-Capillary 92 65 - 99 mg/dL     HEENT: Normocephalic. Atraumatic. Cardio: RRR without murmur. No JVD .Marland Kitchen Resp: CTA Bilaterally without wheezes or rales. Normal effort   GI: BS positive and ND Musculoskeletal:  No tenderness and No Edema Skin:   Warm and dry. Intact. Neuro: Right upper extremity to minus biceps triceps trace grip, 2- hip knee extensor synergy 0 at the foot and ankle Awake, does not follow commands consistently Gen NAD. Vital signs reviewed.  Psych: Limited due to a aphasia but appears to be fairly upbeat  Assessment/Plan: 1. Functional deficits secondary to Left MCA infarct with R HP, Aphasia which require 3+ hours per day of interdisciplinary therapy in a comprehensive inpatient rehab setting. Physiatrist is providing close team supervision and 24 hour management of active medical  problems listed below. Physiatrist and rehab team continue to assess barriers to discharge/monitor patient progress toward functional and medical goals. FIM: Function - Bathing Position: Bed Body parts bathed by patient: Chest, Abdomen, Left upper leg Body parts bathed by helper: Right arm, Left arm, Front perineal area, Buttocks, Right upper leg, Right lower leg, Left lower leg, Back Assist Level: (max assist)  Function- Upper Body Dressing/Undressing What is the patient wearing?: Pull over shirt/dress Bra - Perfomed by helper: Thread/unthread right bra strap, Thread/unthread left bra strap, Hook/unhook bra (pull down sports bra) Pull over shirt/dress - Perfomed by patient: Thread/unthread left sleeve, Put head through opening, Pull shirt over  trunk Pull over shirt/dress - Perfomed by helper: Pull shirt over trunk, Thread/unthread right sleeve Assist Level: Touching or steadying assistance(Pt > 75%) Function - Lower Body Dressing/Undressing What is the patient wearing?: Pants, Non-skid slipper socks Position: Bed Pants- Performed by patient: Thread/unthread left pants leg Pants- Performed by helper: Thread/unthread right pants leg, Pull pants up/down Non-skid slipper socks- Performed by helper: Don/doff right sock, Don/doff left sock Socks - Performed by helper: Don/doff right sock, Don/doff left sock Shoes - Performed by helper: Don/doff right shoe, Don/doff left shoe(slip on) Assist for footwear: Dependant Assist for lower body dressing: (Max A)  Function - Toileting Toileting activity did not occur: No continent bowel/bladder event Toileting steps completed by helper: Adjust clothing prior to toileting, Performs perineal hygiene, Adjust clothing after toileting Toileting Assistive Devices: Grab bar or rail Assist level: Touching or steadying assistance (Pt.75%)  Function - Air cabin crew transfer activity did not occur: Safety/medical concerns Toilet transfer assistive device: Facilities manager lift: Stedy Assist level to toilet: 2 helpers Assist level from toilet: 2 helpers  Function - Chair/bed transfer Chair/bed transfer activity did not occur: Safety/medical concerns Chair/bed transfer method: Lateral scoot Chair/bed transfer assist level: 2 helpers Chair/bed transfer assistive device: Armrests, Sliding board Mechanical lift: Maximove Chair/bed transfer details: Manual facilitation for weight shifting, Verbal cues for precautions/safety, Tactile cues for posture, Tactile cues for placement, Tactile cues for weight shifting, Tactile cues for sequencing, Tactile cues for weight bearing  Function - Locomotion: Wheelchair Wheelchair activity did not occur: Safety/medical concerns Assist Level:  Maximal assistance (Pt 25 - 49%) Wheel 50 feet with 2 turns activity did not occur: Safety/medical concerns Assist Level: Maximal assistance (Pt 25 - 49%) Wheel 150 feet activity did not occur: Safety/medical concerns Function - Locomotion: Ambulation Ambulation activity did not occur: Safety/medical concerns Walk 10 feet activity did not occur: Safety/medical concerns Walk 50 feet with 2 turns activity did not occur: Safety/medical concerns Walk 150 feet activity did not occur: Safety/medical concerns Walk 10 feet on uneven surfaces activity did not occur: Safety/medical concerns  Function - Comprehension Comprehension: Auditory Comprehension assist level: Understands basic 25 - 49% of the time/ requires cueing 50 - 75% of the time  Function - Expression Expression: Verbal Expression assistive device: Other (Comment)(Nods.) Expression assist level: Expresses basis less than 25% of the time/requires cueing >75% of the time.  Function - Social Interaction Social Interaction assist level: Interacts appropriately less than 25% of the time. May be withdrawn or combative.  Function - Problem Solving Problem solving assist level: Solves basic less than 25% of the time - needs direction nearly all the time or does not effectively solve problems and may need a restraint for safety  Function - Memory Memory assist level: Recognizes or recalls less than 25% of the time/requires cueing greater than 75% of the time  Patient normally able to recall (first 3 days only): (unable to assess)  Medical Problem List and Plan: 1. Functional deficits secondary to  due to L-MCA stroke with extension   Aphasia with Right spastic hemiplegia   Cont CIR - PT, OT, SLP, some improvement in motor strength Will likely need SNF placement. 2.  DVT Prophylaxis/Anticoagulation: Pharmaceutical: Lovenox 3. Pain Management: tylenol prn, knee pain RIght improving with Voltaren gel 4. Mood: LCSW to follow for evaluation  when appropriate.  5. Neuropsych: This patient is not capable of making decisions on her own behalf. 6. Skin/Wound Care: Routine pressure relief measures. Maintain adequate nutritional and hydration status.  7. Fluids/Electrolytes/Nutrition: Monitor I/O.  8. T2DM: Hgb A1C- Monitor BS ac/hs and use SSI for elevated BS. Continue to titrate medications for tighter control.  CBG (last 3)  Recent Labs    07/07/17 1648 07/07/17 2134 07/08/17 0644  GLUCAP 247* 193* 92    Lantus adjusted to 35 units on 2/19 8U novalog with meals, CBG controlled 07/08/2017    9. HTN: Improved control 2/16 Vitals:   07/07/17 1455 07/08/17 0240  BP: 135/64 134/69  Pulse: 75 76  Resp: 16 18  Temp: 99.5 F (37.5 C) 98.1 F (36.7 C)  SpO2: 90% 98%    Started amlodipine 2/6, Controlled 2/22     10. Fever: resolved   off Unasyn per ID. CXR neg , repeat urine neg, no DVT,recent ECHO no evidence of endocarditis no other symptoms  Hep A,B,C neg, HIV neg   Appreciate ID recs, signed off.    11. Dysphagia: Continue dysphagia 1, nectar liquids with assistance at meals to help maintain adequate hydration.  12. Recent episode of  Depression: Now onCelexa last month.   13. Obesity             Body mass index is 32.91 kg/m.             Diet and exercise education             Will cont to encourage weight loss to increase endurance and promote overall health 14.  Hypokalemia    Supplemented x3 days   Potassium 3.2 on 2/11   Improved to 3.6 2/13, stable 2/18   Mag ordered 15. Leukocytosis: improving   WBCs 10.6 on 2/18  Afebrile   Cont to monitor   See below 16. Obesity   Body mass index is 32.28 kg/m.   Diet and exercise education   Encourage weight loss to increase endurance and promote overall health 17. Hypernatremia  Na+ 153 on 2/11   Likely dehydration   IVF started on 2/11   Improved to 143 2/18 18.   E. coli UTI which was resistant to Keflex.  Began Bactrim twice daily for 7 days  07/02/2017 Urinary retention, ICP add, flomax, increase Urecholine to 10 mg 4 times daily, looks like pt starting to void as per flow sheet LOS (Days) 18 A FACE TO FACE EVALUATION WAS PERFORMED  Charlett Blake 07/08/2017, 8:34 AM

## 2017-07-08 NOTE — Plan of Care (Signed)
  RH BOWEL ELIMINATION RH STG MANAGE BOWEL WITH ASSISTANCE Description STG Manage Bowel with max Assistance.  07/08/2017 0433 - Progressing by Etheleen Nicks, RN   RH BLADDER ELIMINATION RH STG MANAGE BLADDER WITH ASSISTANCE Description STG Manage Bladder With max Assistance  07/08/2017 0433 - Progressing by Etheleen Nicks, RN   RH SKIN INTEGRITY RH STG SKIN FREE OF INFECTION/BREAKDOWN Description  No new breakdown with mod assist   07/08/2017 0433 - Progressing by Etheleen Nicks, RN   RH SAFETY RH STG ADHERE TO SAFETY PRECAUTIONS W/ASSISTANCE/DEVICE Description STG Adhere to Safety Precautions With Max Assistance/Device.  07/08/2017 0433 - Progressing by Etheleen Nicks, RN

## 2017-07-08 NOTE — [place] FL2 (Signed)
[place] MEDICAID FL2 LEVEL OF CARE SCREENING TOOL     IDENTIFICATION  Patient Name: Sandra [name] Birthdate: Mar 06, 1941 Sex: female Admission Date (Current Location): 06/20/2017  [facility] and [place] Number:  [employer] and Address:  [facility] [place] [facility], [place] [address] [place], [place] [address]      Provider Number: [id number]  Attending Physician Name and Address:  Charlett Blake, MD  Relative Name and Phone Number:  Dan Humphreys [phone]    Current Level of Care: Other (Comment)(rehab) Recommended Level of Care: [place] Prior Approval Number:    Date Approved/Denied:   PASRR Number: [id number]  Discharge Plan: SNF    Current Diagnoses: Patient Active Problem List   Diagnosis Date Noted  . Hypernatremia   . Hypokalemia   . Follow-up exam   . Aphasia   . Hemiparesthesia   . 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 Acute ischemic right MCA stroke ([place]) 06/20/2017  . Acute ischemic stroke ([place])   . Diabetes mellitus type 2 in obese ([place])   . Benign essential HTN   . Noncompliance   . FUO (fever of unknown origin)   . Leukocytosis   . Dysphagia, post-stroke   . TIA (transient ischemic attack) 06/15/2017  . Hyperlipidemia associated with type 2 diabetes mellitus ([place]) 06/14/2017  . Type 2 diabetes mellitus with neurological complications ([place]) 69/48/5462  . Stroke ([place]) 03/31/2017  . History of adenomatous polyp of colon 02/11/2017  . Dysphagia 11/19/2016  . Essential hypertension 08/12/2016  . Hemorrhoids 06/16/2016  . Rectal bleeding 06/16/2016  . Constipation 06/16/2016    Orientation RESPIRATION BLADDER Height & Weight     Self, Place  Normal Incontinent, External catheter(cathing every 4hr starting to void) Weight: 194 lb 0.1 oz (88 kg) Height:  5\' 5"  (165.1 cm)  BEHAVIORAL SYMPTOMS/MOOD NEUROLOGICAL BOWEL NUTRITION STATUS      Incontinent  Diet(Dys 1 thin liquids)  AMBULATORY STATUS COMMUNICATION OF NEEDS Skin   Total Care Does not communicate Normal                       Personal Care Assistance Level of Assistance  Bathing, Feeding, Dressing Bathing Assistance: Maximum assistance Feeding assistance: Limited assistance Dressing Assistance: Maximum assistance     Functional Limitations Info  Sight, Speech Sight Info: Adequate Hearing Info: Adequate Speech Info: Impaired    SPECIAL CARE FACTORS FREQUENCY  PT (By licensed PT), OT (By licensed OT), Bowel and bladder program, Speech therapy     PT Frequency: 5x week OT Frequency: 5x week Bowel and Bladder Program Frequency: Bladder meds starting to void   Speech Therapy Frequency: 5x week      Contractures Contractures Info: Not present    Additional Factors Info  Code Status, Allergies, Psychotropic Code Status Info: Full Code Allergies Info: Hctz, Morphine and related, Valium Psychotropic Info: Celexa          Current Medications (07/08/2017):  This is the current hospital active medication list Current Facility-Administered Medications  Medication Dose Route Frequency Provider Last Rate Last Dose  . acetaminophen (TYLENOL) tablet 325-650 mg  325-650 mg Oral Q4H PRN Bary Leriche, PA-C   650 mg at 07/03/17 2043  . alum & mag hydroxide-simeth (MAALOX/MYLANTA) 200-200-20 MG/5ML suspension 30 mL  30 mL Oral Q4H PRN Love, Pamela S, PA-C      . amLODipine (NORVASC) tablet 10 mg  10 mg Oral Daily  Kirsteins, Luanna Salk, MD   10 mg at 07/07/17 0827  . aspirin EC tablet 325 mg  325 mg Oral Daily Bary Leriche, PA-C   325 mg at 07/07/17 9678  . atorvastatin (LIPITOR) tablet 40 mg  40 mg Oral q1800 Bary Leriche, PA-C   40 mg at 07/07/17 1824  . bethanechol (URECHOLINE) tablet 10 mg  10 mg Oral QID Charlett Blake, MD   10 mg at 07/07/17 2222  . bisacodyl (DULCOLAX) suppository 10 mg  10 mg Rectal Daily PRN Bary Leriche, PA-C   10 mg at 06/23/17 1716  .  citalopram (CELEXA) tablet 10 mg  10 mg Oral Daily Bary Leriche, PA-C   10 mg at 07/07/17 0827  . cloNIDine (CATAPRES) tablet 0.1 mg  0.1 mg Oral Q6H PRN Love, Pamela S, PA-C      . clopidogrel (PLAVIX) tablet 75 mg  75 mg Oral Q breakfast Bary Leriche, PA-C   75 mg at 07/07/17 9381  . diphenhydrAMINE (BENADRYL) 12.5 MG/5ML elixir 12.5-25 mg  12.5-25 mg Oral Q6H PRN Love, Pamela S, PA-C      . enoxaparin (LOVENOX) injection 40 mg  40 mg Subcutaneous Q24H Love, Pamela S, PA-C   40 mg at 07/07/17 1824  . guaiFENesin-dextromethorphan (ROBITUSSIN DM) 100-10 MG/5ML syrup 5-10 mL  5-10 mL Oral Q6H PRN Love, Pamela S, PA-C      . insulin aspart (novoLOG) injection 0-15 Units  0-15 Units Subcutaneous TID WC Bary Leriche, PA-C   5 Units at 07/07/17 1824  . insulin aspart (novoLOG) injection 0-5 Units  0-5 Units Subcutaneous QHS Bary Leriche, PA-C   2 Units at 06/25/17 2154  . insulin aspart (novoLOG) injection 8 Units  8 Units Subcutaneous TID WC Bary Leriche, PA-C   8 Units at 07/06/17 1209  . insulin glargine (LANTUS) injection 35 Units  35 Units Subcutaneous Daily Charlett Blake, MD   35 Units at 07/07/17 0825  . MEDLINE mouth rinse  15 mL Mouth Rinse BID Bary Leriche, PA-C   15 mL at 07/07/17 2000  . prochlorperazine (COMPAZINE) tablet 5-10 mg  5-10 mg Oral Q6H PRN Love, Pamela S, PA-C       Or  . prochlorperazine (COMPAZINE) injection 5-10 mg  5-10 mg Intramuscular Q6H PRN Love, Pamela S, PA-C       Or  . prochlorperazine (COMPAZINE) suppository 12.5 mg  12.5 mg Rectal Q6H PRN Love, Pamela S, PA-C      . protein supplement (PREMIER PROTEIN) liquid  2 oz Oral QID Love, Pamela S, PA-C   2 oz at 07/07/17 2223  . RESOURCE THICKENUP CLEAR   Oral PRN Love, Pamela S, PA-C      . senna-docusate (Senokot-S) tablet 2 tablet  2 tablet Oral QHS Bary Leriche, PA-C   2 tablet at 07/07/17 2222  . sodium phosphate (FLEET) 7-19 GM/118ML enema 1 enema  1 enema Rectal Once PRN Love, Ivan Anchors, PA-C       . sulfamethoxazole-trimethoprim (BACTRIM,SEPTRA) 400-80 MG per tablet 1 tablet  1 tablet Oral Q12H Meredith Staggers, MD   1 tablet at 07/08/17 [phone]  . tamsulosin (FLOMAX) capsule 0.4 mg  0.4 mg Oral QPC supper Charlett Blake, MD   0.4 mg at 07/07/17 1824  . traZODone (DESYREL) tablet 25-50 mg  25-50 mg Oral QHS PRN Bary Leriche, PA-C   50 mg at 07/07/17 2221  . vitamin B-12 (CYANOCOBALAMIN) tablet  1,000 mcg  1,000 mcg Oral Daily Bary Leriche, PA-C   1,000 mcg at 07/07/17 5329     Discharge Medications: Please see discharge summary for a list of discharge medications.  Relevant Imaging Results:  Relevant Lab Results:   Additional Information SSN: [id number] [place], [place]

## 2017-07-09 ENCOUNTER — Inpatient Hospital Stay (HOSPITAL_COMMUNITY): Payer: Medicare Other | Admitting: Physical Therapy

## 2017-07-09 LAB — GLUCOSE, CAPILLARY
GLUCOSE-CAPILLARY: 85 mg/dL (ref 65–99)
Glucose-Capillary: 149 mg/dL — ABNORMAL HIGH (ref 65–99)
Glucose-Capillary: 98 mg/dL (ref 65–99)
Glucose-Capillary: 131 mg/dL — ABNORMAL HIGH (ref 65–99)

## 2017-07-09 NOTE — Progress Notes (Signed)
Hypoglycemic Event  CBG:59  Treatment: 15 GM carbohydrate snack  Symptoms: None  Follow-up CBG: Time:2220 CBG Result:58  Possible Reasons for Event: Inadequate meal intake  Comments/MD notified:notify Dr Alvester Chou

## 2017-07-09 NOTE — Progress Notes (Signed)
Physical Therapy Session Note  Patient Details  Name: Sandra [name] MRN: [id number] Date of Birth: 1940/07/16  Today's Date: 07/09/2017 PT Individual Time: 1535-1630 PT Individual Time Calculation (min): 55 min   Short Term Goals: Week 3:  PT Short Term Goal 1 (Week 3): =LTGs due to ELOS  Skilled Therapeutic Interventions/Progress Updates:    no c/o pain at rest but does groan throughout session with weight bearing.  Session focus on standing.    Pt rolled L/R with min assist for hygiene and donning clean brief.  Supine>sit with max assist +1.  SIt<>Stand in stedy from elevated bed with total assist +1.  Pt brought in front of mirror for visual feedback, worked on upright posture in stedy, with up to max assist and max multimodal cues.  Transitioned to tilt in space w/c.  Sit<>stand with 3 muskateers and pt able to stand upright x15 seconds before sitting.  Pt's son present and stood in front of pt for 3 stands, +2 assist to stand, pt initiating final stand.  Returned to room at end of session and positioned in tilt in space with call bell in reach and needs met.   Therapy Documentation Precautions:  Precautions Precautions: Fall Precaution Comments: dense R hemiparesis, R inattention Restrictions Weight Bearing Restrictions: No Other Position/Activity Restrictions: Positioning of right upper extremity in sight of patient and not pulling on it as it is flaccid.    See Function Navigator for Current Functional Status.   Therapy/Group: Individual Therapy  Michel Santee 07/09/2017, 5:32 PM

## 2017-07-09 NOTE — Progress Notes (Signed)
Patient ID: Sandra [name], female   DOB: 12-30-1940, 77 y.o.   MRN: [id number]   Sandra [name] is a 77 y.o. female who was admitted for CIR with functional deficits following a left MCA stroke with  aphasia and spastic right hemiparesis  Past Medical History:  Diagnosis Date  . Colon polyp   . Constipation   . Diabetes mellitus without complication ([place])   . GI bleed   . Hemorrhoids   . High cholesterol   . Hypertension   . PAD (peripheral artery disease) ([place])   . Peripheral neuropathy   . Rectal bleeding    "for 7 years"  . Rectal bleeding   . Stroke [facility])      Subjective: No new complaints. Aphasic   Objective: Vital signs in last 24 hours: Temp:  [97.8 F (36.6 C)-98.3 F (36.8 C)] 98.3 F (36.8 C) (02/23 0532) Pulse Rate:  [84-95] 84 (02/23 0532) Resp:  [18] 18 (02/22 1419) BP: (129-133)/(56-67) 129/67 (02/23 0532) SpO2:  [94 %-95 %] 94 % (02/23 0532) Weight change:  Last BM Date: 07/09/17  Intake/Output from previous day: 02/22 0701 - 02/23 0700 In: 770 [P.O.:770] Out: 700 [Urine:700] Last cbgs: CBG (last 3)  Recent Labs    07/08/17 2301 07/09/17 0644 07/09/17 1216  GLUCAP 86 85 149*   Patient Vitals for the past 24 hrs:  BP Temp Temp src Pulse Resp SpO2  07/09/17 0532 129/67 98.3 F (36.8 C) Oral 84 - 94 %  07/08/17 1419 (!) 133/56 97.8 F (36.6 C) Oral 95 18 95 %    Physical Exam General: No apparent distress   HEENT: not dry Lungs: Normal effort. Lungs clear to auscultation, no crackles or wheezes. Cardiovascular: Regular rate and rhythm, no edema Abdomen: S/NT/ND; BS(+) Musculoskeletal:  unchanged Neurological: No new neurological deficits; aphasic with dense right hemiparesis Wounds: N/A    Extremities.  No edema Mental state: Alert, oriented, cooperative    Lab Results: BMET    Component Value Date/Time   NA 143 07/04/2017 0454   K 3.6 07/04/2017 0454   CL 106 07/04/2017 0454   CO2 29 07/04/2017 0454   GLUCOSE 124 (H)  07/04/2017 0454   BUN 16 07/04/2017 0454   CREATININE 0.72 07/04/2017 0454   CREATININE 0.80 03/17/2017 1051   CALCIUM 8.4 (L) 07/04/2017 0454   GFRNONAA >60 07/04/2017 0454   GFRAA >60 07/04/2017 0454   CBC    Component Value Date/Time   WBC 10.8 (H) 07/04/2017 0454   RBC 4.54 07/04/2017 0454   HGB 12.8 07/04/2017 0454   HCT 39.8 07/04/2017 0454   PLT 402 (H) 07/04/2017 0454   MCV 87.7 07/04/2017 0454   MCH 28.2 07/04/2017 0454   MCHC 32.2 07/04/2017 0454   RDW 14.6 07/04/2017 0454   LYMPHSABS 2.2 06/22/2017 0958   MONOABS 1.1 (H) 06/22/2017 0958   EOSABS 0.0 06/22/2017 0958   BASOSABS 0.0 06/22/2017 0958    Studies/Results: No results found.  Medications: I have reviewed the patient's current medications.  Assessment/Plan:  Functional deficits following left MCA stroke with dense right hemiparesis and a aphasia.  Continue OT PT and SLP DVD prophylaxis.  Continue Lovenox Diabetes mellitus type 2 continue SSI coverage.  Continue to monitor Hypertension.  Well controlled.  Continue amlodipine    Length of stay, days: [place] , MD 07/09/2017, 12:53 PM

## 2017-07-10 ENCOUNTER — Inpatient Hospital Stay (HOSPITAL_COMMUNITY): Payer: Medicare Other | Admitting: Occupational Therapy

## 2017-07-10 LAB — GLUCOSE, CAPILLARY
GLUCOSE-CAPILLARY: 143 mg/dL — AB (ref 65–99)
Glucose-Capillary: 99 mg/dL (ref 65–99)
Glucose-Capillary: 181 mg/dL — ABNORMAL HIGH (ref 65–99)
Glucose-Capillary: 214 mg/dL — ABNORMAL HIGH (ref 65–99)

## 2017-07-10 NOTE — Progress Notes (Signed)
Occupational Therapy Session Note  Patient Details  Name: Sandra [name] MRN: [id number] Date of Birth: [birth year]  Today's Date: 07/10/2017 OT Individual Time: 0800-0900 OT Individual Time Calculation (min): 60 min    Short Term Goals: Week 3:  OT Short Term Goal 1 (Week 3): STG=LTG 2/2 ELOS  Skilled Therapeutic Interventions/Progress Updates:    Treatment session focused on ADLs/self care training, transfer training, balance training, pt education, NMR, and safety awareness training. Upon entering pt supine in bed and agreeable to AM ADLs. Pt completed bed d/b for LB with max A from therapist. Pt able to complete r/L rolling withmod A and v/c for hand foot/placement using bed rails. Noted R sided initiation during tasks and required [facility] for A with assimilation. Pt completed supine to EOB sit with modA using bed rails. Completed SBT from EOB to w/c with max A x 2. Positioned at sink for UB d/b. Required HOH A for R UE assimilation and use and mod A to complete tasks. Decreased follow through with tasks, noted perseveration. Pt completed feeding task with therapist, attempted to "drink" food from dishes and required continual prompting to use spoon with L hand. Pt repositioned in TIS w/c and left resting with all needs met.   Therapy Documentation Precautions:  Precautions Precautions: Fall Precaution Comments: dense R hemiparesis, R inattention Restrictions Weight Bearing Restrictions: No Other Position/Activity Restrictions: Positioning of right upper extremity in sight of patient and not pulling on it as it is flaccid.    Vital Signs: Therapy Vitals Temp: 97.8 F (36.6 C) Temp Source: Oral Pulse Rate: 72 Resp: 16 BP: (!) 142/68 Patient Position (if appropriate): Lying Oxygen Therapy SpO2: 96 % O2 Device: Not Delivered Pain: Pain Assessment Pain Assessment: No/denies pain  See Function Navigator for Current Functional Status.   Therapy/Group: Individual Therapy  Delon [place] 07/10/2017, 9:33 AM

## 2017-07-10 NOTE — Progress Notes (Signed)
Pt having very low PO intake even with encouragement and pushing fluids from staff. Pt unable to void requiring I&O caths q8hrs. Output ranging between 150cc-300cc Q8 hrs. Will continue to encourage intake and monitor I&Os.

## 2017-07-10 NOTE — Progress Notes (Signed)
Pt voided unknown amount in brief with no awareness of void. PVR 121cc.

## 2017-07-10 NOTE — Progress Notes (Signed)
Patient ID: Sandra [name], female   DOB: 1940/06/10, 77 y.o.   MRN: [id number]   Sandra [name] is a 77 y.o. female who was admitted for CIR with functional deficits secondary to a left MCA stroke with dense a aphasia and spastic right hemiparesis  Past Medical History:  Diagnosis Date  . Colon polyp   . Constipation   . Diabetes mellitus without complication ([place])   . GI bleed   . Hemorrhoids   . High cholesterol   . Hypertension   . PAD (peripheral artery disease) ([place])   . Peripheral neuropathy   . Rectal bleeding    "for 7 years"  . Rectal bleeding   . Stroke [facility])      Subjective: No new complaints. No new problems.  Appears comfortable with dense a aphasia  Objective: Vital signs in last 24 hours: Temp:  [97.8 F (36.6 C)] 97.8 F (36.6 C) (02/24 0543) Pulse Rate:  [72-75] 72 (02/24 0543) Resp:  [16-18] 16 (02/24 0543) BP: (138-142)/(58-68) 142/68 (02/24 0543) SpO2:  [91 %-96 %] 96 % (02/24 0543) Weight change:  Last BM Date: 07/09/17  Intake/Output from previous day: 02/23 0701 - 02/24 0700 In: 420 [P.O.:420] Out: 750 [Urine:750] Last cbgs: CBG (last 3)  Recent Labs    07/09/17 1721 07/09/17 2218 07/10/17 0639  GLUCAP 98 131* 99   Patient Vitals for the past 24 hrs:  BP Temp Temp src Pulse Resp SpO2  07/10/17 0543 (!) 142/68 97.8 F (36.6 C) Oral 72 16 96 %  07/09/17 1850 (!) 138/58 - - 75 18 91 %    Physical Exam General: No apparent distress; responds appropriately HEENT: Well-hydrated Lungs: Normal effort. Lungs clear to auscultation, no crackles or wheezes. Cardiovascular: Regular rate and rhythm, no edema Abdomen: S/NT/ND; BS(+) Musculoskeletal:  unchanged Neurological: No new neurological deficits with a aphasia and dense right hemiparesis Extremities.  No edema Skin: clear   Mental state: Alert, oriented, cooperative    Lab Results: BMET    Component Value Date/Time   NA 143 07/04/2017 0454   K 3.6 07/04/2017 0454   CL 106  07/04/2017 0454   CO2 29 07/04/2017 0454   GLUCOSE 124 (H) 07/04/2017 0454   BUN 16 07/04/2017 0454   CREATININE 0.72 07/04/2017 0454   CREATININE 0.80 03/17/2017 1051   CALCIUM 8.4 (L) 07/04/2017 0454   GFRNONAA >60 07/04/2017 0454   GFRAA >60 07/04/2017 0454   CBC    Component Value Date/Time   WBC 10.8 (H) 07/04/2017 0454   RBC 4.54 07/04/2017 0454   HGB 12.8 07/04/2017 0454   HCT 39.8 07/04/2017 0454   PLT 402 (H) 07/04/2017 0454   MCV 87.7 07/04/2017 0454   MCH 28.2 07/04/2017 0454   MCHC 32.2 07/04/2017 0454   RDW 14.6 07/04/2017 0454   LYMPHSABS 2.2 06/22/2017 0958   MONOABS 1.1 (H) 06/22/2017 0958   EOSABS 0.0 06/22/2017 0958   BASOSABS 0.0 06/22/2017 0958    Medications: I have reviewed the patient's current medications.  Assessment/Plan:  Functional deficits status post left MCA stroke with right hemiparesis and a aphasia.  Continue CIR with OT PT and SLP DVT prophylaxis.  Continue Lovenox Diabetes mellitus.  Continue to monitor.  Continue SSI coverage.  Remains well controlled Essential hypertension.  Stable.  No change in regimen    Length of stay, days: [place] , MD 07/10/2017, 9:44 AM

## 2017-07-11 ENCOUNTER — Inpatient Hospital Stay (HOSPITAL_COMMUNITY): Payer: Medicare Other

## 2017-07-11 ENCOUNTER — Inpatient Hospital Stay (HOSPITAL_COMMUNITY): Payer: Medicare Other | Admitting: Speech Pathology

## 2017-07-11 ENCOUNTER — Inpatient Hospital Stay (HOSPITAL_COMMUNITY): Payer: Medicare Other | Admitting: Occupational Therapy

## 2017-07-11 LAB — GLUCOSE, CAPILLARY
GLUCOSE-CAPILLARY: 94 mg/dL (ref 65–99)
Glucose-Capillary: 200 mg/dL — ABNORMAL HIGH (ref 65–99)
Glucose-Capillary: 71 mg/dL (ref 65–99)
Glucose-Capillary: 156 mg/dL — ABNORMAL HIGH (ref 65–99)

## 2017-07-11 LAB — CBC
HCT: 37.8 % (ref 36.0–46.0)
Hemoglobin: 12.1 g/dL (ref 12.0–15.0)
MCH: 28.1 pg (ref 26.0–34.0)
MCHC: 32 g/dL (ref 30.0–36.0)
MCV: 87.7 fL (ref 78.0–100.0)
PLATELETS: 305 10*3/uL (ref 150–400)
RBC: 4.31 MIL/uL (ref 3.87–5.11)
RDW: 14.7 % (ref 11.5–15.5)
WBC: 5.8 10*3/uL (ref 4.0–10.5)

## 2017-07-11 LAB — BASIC METABOLIC PANEL
Anion gap: 9 (ref 5–15)
BUN: 14 mg/dL (ref 6–20)
CALCIUM: 8.5 mg/dL — AB (ref 8.9–10.3)
CO2: 28 mmol/L (ref 22–32)
Chloride: 101 mmol/L (ref 101–111)
Creatinine, Ser: 0.62 mg/dL (ref 0.44–1.00)
GFR calc Af Amer: >60 mL/min (ref >60)
GLUCOSE: 100 mg/dL — AB (ref 65–99)
Potassium: 4.1 mmol/L (ref 3.5–5.1)
Sodium: 138 mmol/L (ref 135–145)

## 2017-07-11 MED ORDER — BETHANECHOL CHLORIDE 25 MG PO TABS
25.0000 mg | ORAL_TABLET | Freq: Three times a day (TID) | ORAL | Status: DC
  Administered 2017-07-11 – 2017-07-14 (×10): 25 mg via ORAL
  Filled 2017-07-11 (×10): qty 1

## 2017-07-11 NOTE — Progress Notes (Signed)
Physical Therapy Note  Patient Details  Name: Sandra [name] MRN: [id number] Date of Birth: March 20, 1941 Today's Date: 07/11/2017  1300-1415, 75 min individual tx Pain: pt grimaced and moaned at times during session.  Difficult to assess pain vs emotion  Pt seated in tilt in space w/c.  Use of SaraStedy for sit>< stand, focusing on neuro re-ed via multimodal cues and mirror for midline orientation.  Pt used R hand spontaneously to attempt to grasp post- it notes on mirror, marked with the letters of her first name.  Pt intermittently seemed to understand task of using separate letters to spell her first name.  Sit>< stand with use of L hand on bar of Stedy, x 15 during post- it note and playing cards activity.  Pt initiated standing q request with VCS and tactile cues. In supine, bil lower trunk rotation, bil bridging with assistance.  W/c> mat slide board transfer with mod/max assist, to L.  Pt sat x 10 minutes during R/L reaching activities, with feet supported on 6" high step, belt around bil thighs to prevent excessive hip external rotation bil, with stand by to min assist for balance.  Sit> supine with mod assist.    Pt left resting in w/c with quick release belt applied and all needs within reach.  See function navigator for current status.  Sahvanna Mcmanigal 07/11/2017, 10:41 AM

## 2017-07-11 NOTE — Progress Notes (Signed)
Physical Therapy Note  Patient Details  Name: Makinze Jani MRN: [id number] Date of Birth: April 06, 1941 Today's Date: 07/11/2017  1530-1600, 30 min individual tx Pain: none per pt  SaraStedy for w/c> bed transfer. Pt incontinent of stool. Bed mobility for rolling to assist with hygiene change.  Pt understood simple directional visual cues for rolling.  With tactile cues, she used L hand to pull up pants on L side, in bed.   Neuromuscular re-education via multimodal cues for RLE hip and knee extension, bil ankle pumps, bil bridging, hip adduction after PT placed RLE off of bed, R heel slides.  Pt left resting in bed with alarm set and all needs at hand.  See function navigator for current status.   Tameka Hoiland 07/11/2017, 3:55 PM

## 2017-07-11 NOTE — Progress Notes (Signed)
Speech Language Pathology Daily Session Note  Patient Details  Name: Sandra [name] MRN: [id number] Date of Birth: 1940-08-31  Today's Date: 07/11/2017 SLP Individual Time: 0915-1000 SLP Individual Time Calculation (min): 45 min  Short Term Goals: Week 3: SLP Short Term Goal 1 (Week 3): Pt will answer yes/no immediate, personally relevant questions in 75% of opportunities with Mod A multimodal cues.   SLP Short Term Goal 2 (Week 3): Pt will follow 1 step commands for 75% accuracy during basic, familiar tasks with Mod A multimodal cues.   SLP Short Term Goal 3 (Week 3): Pt will sustain her attention to basic, familiar tasks for 5 minute intervals with Mod A verbal cues for redirection.   SLP Short Term Goal 4 (Week 3): Pt will produce phonemes in isolation with 50% accuracy with Max A multimodal cues. SLP Short Term Goal 5 (Week 3): Given Mod A multimodal cues, pt will select requested object from field of 2 in 75% of opportunities.  SLP Short Term Goal 6 (Week 3): Pt will consume dys 1 textures and thin liquids with mod verbal cues for use of swallowing precautions and minimal overt s/s of aspiration.    Skilled Therapeutic Interventions: Skilled treatment session focused on communication goals. SLP facilitated session by providing Max A multimodal cues to name common objects. Pt able to name "ball", "bed", "butt", "baby", "pink", "pen", and "pants" with 70% accuracy. Pt also spontaneously verbalized "okay" x1. Pt attempted to verbalize two-word utterances, such as "big ball", "bounce ball", and "pink pants"; however, only one word was intelligible. Pt demonstrated sustained attention to tasks for 5 minute intervals with Mod A verbal cues for redirection. Pt left supine in bed with alarm on and all needs within reach. Continue with current plan of care.      Function:  Cognition Comprehension Comprehension assist level: Understands basic less than 25% of the time/ requires cueing >75% of the  time  Expression   Expression assist level: Expresses basis less than 25% of the time/requires cueing >75% of the time.  Social Interaction Social Interaction assist level: Interacts appropriately less than 25% of the time. May be withdrawn or combative.  Problem Solving Problem solving assist level: Solves basic less than 25% of the time - needs direction nearly all the time or does not effectively solve problems and may need a restraint for safety  Memory Memory assist level: Recognizes or recalls less than 25% of the time/requires cueing greater than 75% of the time    Pain Pain Assessment Pain Assessment: No/denies pain  Therapy/Group: Individual Therapy  Meredeth Ide  SLP - Student 07/11/2017, 10:18 AM

## 2017-07-11 NOTE — Plan of Care (Signed)
  RH BOWEL ELIMINATION RH STG MANAGE BOWEL WITH ASSISTANCE Description STG Manage Bowel with max Assistance.  07/11/2017 1216 - Progressing by Dietrich Pates, RN   RH BLADDER ELIMINATION RH STG MANAGE BLADDER WITH ASSISTANCE Description STG Manage Bladder With max Assistance  07/11/2017 1216 - Progressing by Dietrich Pates, RN   RH SKIN INTEGRITY RH STG SKIN FREE OF INFECTION/BREAKDOWN Description  No new breakdown with mod assist   07/11/2017 1216 - Progressing by Dietrich Pates, RN   RH SAFETY RH STG ADHERE TO SAFETY PRECAUTIONS W/ASSISTANCE/DEVICE Description STG Adhere to Safety Precautions With Max Assistance/Device.  07/11/2017 1216 - Progressing by Dietrich Pates, RN

## 2017-07-11 NOTE — Progress Notes (Signed)
Occupational Therapy Session Note  Patient Details  Name: Sandra [name] MRN: [id number] Date of Birth: 10-Jul-1940  Today's Date: 07/11/2017 OT Individual Time: 1105-1200 OT Individual Time Calculation (min): 55 min   Short Term Goals: Week 3:  OT Short Term Goal 1 (Week 3): STG=LTG 2/2 ELOS  Skilled Therapeutic Interventions/Progress Updates:    Pt greeted semi-reclined in bed, incontinent of small BM, min A bed mobility for total A peri-care and brief change. Pt then came to sitting EOB and worked on sitting balance while threading pant legs. Pt needed max A to maintain sitting balance while trying to reach forward to thread L pant leg. Sit<>stand in [place] with pt not initiating stand on first 3 attempts. Pt eventually stood with Max A of 2 while +2 pulled up pants. Stedy used to transfer pt to wc with max+2 to power up into standing. Worked on UB dressing sitting in wc with hand over hand A to help bring attention to R hand. Pt needed assist to thread R UE, then was able to thread L and pull shirt overhead. Pt brought to therapy gym and worked on sit<>stand using parallel bars and Pharmacist, hospital. Pt needed max facilitation x2 to achieve stand with 2 helpers and utilized NDT techniques to try to bring pt into upright position. Pt was not initiating after 1st stand although attempted to stand 3 more x. Pt returned to room and left tilted in wc with safety belt on and needs met.   Therapy Documentation Precautions:  Precautions Precautions: Fall Precaution Comments: dense R hemiparesis, R inattention Restrictions Weight Bearing Restrictions: No Other Position/Activity Restrictions: Positioning of right upper extremity in sight of patient and not pulling on it as it is flaccid.   See Function Navigator for Current Functional Status.   Therapy/Group: Individual Therapy  Valma Cava 07/11/2017, 11:58 AM

## 2017-07-11 NOTE — Progress Notes (Signed)
Subjective/Complaints:  Remain aphasic  ROS: limited due to language/communication   Objective: Vital Signs: Blood pressure (!) 130/46, pulse 76, temperature 97.6 F (36.4 C), temperature source Oral, resp. rate 16, height 5' 5"  (1.651 m), weight 88 kg (194 lb 0.1 oz), SpO2 95 %.  Results for orders placed or performed during the hospital encounter of 06/20/17 (from the past 72 hour(s))  Glucose, capillary     Status: Abnormal   Collection Time: 07/08/17 11:19 AM  Result Value Ref Range   Glucose-Capillary 164 (H) 65 - 99 mg/dL  Glucose, capillary     Status: Abnormal   Collection Time: 07/08/17  4:08 PM  Result Value Ref Range   Glucose-Capillary 129 (H) 65 - 99 mg/dL  Glucose, capillary     Status: Abnormal   Collection Time: 07/08/17  9:54 PM  Result Value Ref Range   Glucose-Capillary 59 (L) 65 - 99 mg/dL   Comment 1 Notify RN   Glucose, capillary     Status: Abnormal   Collection Time: 07/08/17 10:23 PM  Result Value Ref Range   Glucose-Capillary 58 (L) 65 - 99 mg/dL  Glucose, capillary     Status: None   Collection Time: 07/08/17 11:01 PM  Result Value Ref Range   Glucose-Capillary 86 65 - 99 mg/dL  Glucose, capillary     Status: None   Collection Time: 07/09/17  6:44 AM  Result Value Ref Range   Glucose-Capillary 85 65 - 99 mg/dL  Glucose, capillary     Status: Abnormal   Collection Time: 07/09/17 12:16 PM  Result Value Ref Range   Glucose-Capillary 149 (H) 65 - 99 mg/dL  Glucose, capillary     Status: None   Collection Time: 07/09/17  5:21 PM  Result Value Ref Range   Glucose-Capillary 98 65 - 99 mg/dL  Glucose, capillary     Status: Abnormal   Collection Time: 07/09/17 10:18 PM  Result Value Ref Range   Glucose-Capillary 131 (H) 65 - 99 mg/dL   Comment 1 Notify RN   Glucose, capillary     Status: None   Collection Time: 07/10/17  6:39 AM  Result Value Ref Range   Glucose-Capillary 99 65 - 99 mg/dL  Glucose, capillary     Status: Abnormal   Collection  Time: 07/10/17 11:48 AM  Result Value Ref Range   Glucose-Capillary 181 (H) 65 - 99 mg/dL  Glucose, capillary     Status: Abnormal   Collection Time: 07/10/17  4:42 PM  Result Value Ref Range   Glucose-Capillary 214 (H) 65 - 99 mg/dL  Glucose, capillary     Status: Abnormal   Collection Time: 07/10/17  8:52 PM  Result Value Ref Range   Glucose-Capillary 143 (H) 65 - 99 mg/dL   Comment 1 Notify RN   CBC     Status: None   Collection Time: 07/11/17  6:05 AM  Result Value Ref Range   WBC 5.8 4.0 - 10.5 K/uL   RBC 4.31 3.87 - 5.11 MIL/uL   Hemoglobin 12.1 12.0 - 15.0 g/dL   HCT 37.8 36.0 - 46.0 %   MCV 87.7 78.0 - 100.0 fL   MCH 28.1 26.0 - 34.0 pg   MCHC 32.0 30.0 - 36.0 g/dL   RDW 14.7 11.5 - 15.5 %   Platelets 305 150 - 400 K/uL    Comment: Performed at [facility], [address] [place], [place] [address]  Basic metabolic panel     Status: Abnormal  Collection Time: 07/11/17  6:05 AM  Result Value Ref Range   Sodium 138 135 - 145 mmol/L   Potassium 4.1 3.5 - 5.1 mmol/L   Chloride 101 101 - 111 mmol/L   CO2 28 22 - 32 mmol/L   Glucose, Bld 100 (H) 65 - 99 mg/dL   BUN 14 6 - 20 mg/dL   Creatinine, Ser 0.62 0.44 - 1.00 mg/dL   Calcium 8.5 (L) 8.9 - 10.3 mg/dL   GFR calc non Af Amer >60 >60 mL/min   GFR calc Af Amer >60 >60 mL/min    Comment: (NOTE) The eGFR has been calculated using the CKD EPI equation. This calculation has not been validated in all clinical situations. eGFR's persistently <60 mL/min signify possible Chronic Kidney Disease.    Anion gap 9 5 - 15    Comment: Performed at [facility] [address], [place], [place] [address]  Glucose, capillary     Status: None   Collection Time: 07/11/17  6:41 AM  Result Value Ref Range   Glucose-Capillary 94 65 - 99 mg/dL     HEENT: Normocephalic. Atraumatic. Cardio: RRR without murmur. No JVD .Marland Kitchen Resp: CTA Bilaterally without wheezes or rales. Normal effort   GI: BS positive and  ND Musculoskeletal:  No tenderness and No Edema Skin:   Warm and dry. Intact. Neuro: Right upper extremity to minus biceps triceps trace grip, 2- hip knee extensor synergy 0 at the foot and ankle Awake, does not follow commands consistently Gen NAD. Vital signs reviewed.  Psych: Limited due to a aphasia but appears to be fairly upbeat  Assessment/Plan: 1. Functional deficits secondary to Left MCA infarct with R HP, Aphasia which require 3+ hours per day of interdisciplinary therapy in a comprehensive inpatient rehab setting. Physiatrist is providing close team supervision and 24 hour management of active medical problems listed below. Physiatrist and rehab team continue to assess barriers to discharge/monitor patient progress toward functional and medical goals. FIM: Function - Bathing Position: Bed(UB at sink) Body parts bathed by patient: Chest, Abdomen, Left upper leg, Left lower leg, Right upper leg, Right arm Body parts bathed by helper: Back, Left arm, Front perineal area, Buttocks, Right lower leg Assist Level: Touching or steadying assistance(Pt > 75%)  Function- Upper Body Dressing/Undressing What is the patient wearing?: Pull over shirt/dress Bra - Perfomed by helper: Thread/unthread right bra strap, Thread/unthread left bra strap, Hook/unhook bra (pull down sports bra) Pull over shirt/dress - Perfomed by patient: Thread/unthread left sleeve, Put head through opening, Pull shirt over trunk Pull over shirt/dress - Perfomed by helper: Thread/unthread right sleeve Assist Level: Touching or steadying assistance(Pt > 75%) Function - Lower Body Dressing/Undressing What is the patient wearing?: Pants, Non-skid slipper socks Position: Bed Pants- Performed by patient: Thread/unthread left pants leg Pants- Performed by helper: Thread/unthread right pants leg, Pull pants up/down, Thread/unthread left pants leg Non-skid slipper socks- Performed by helper: Don/doff right sock, Don/doff  left sock Socks - Performed by helper: Don/doff right sock, Don/doff left sock Shoes - Performed by helper: Don/doff right shoe, Don/doff left shoe(slip on) Assist for footwear: Dependant Assist for lower body dressing: (Max A)  Function - Toileting Toileting activity did not occur: No continent bowel/bladder event Toileting steps completed by helper: Adjust clothing prior to toileting, Performs perineal hygiene, Adjust clothing after toileting Toileting Assistive Devices: Grab bar or rail Assist level: Touching or steadying assistance (Pt.75%)  Function - Air cabin crew transfer activity did not occur: Safety/medical concerns  Toilet transfer assistive device: Mechanical lift Mechanical lift: Stedy Assist level to toilet: 2 helpers Assist level from toilet: 2 helpers  Function - Chair/bed transfer Chair/bed transfer activity did not occur: Safety/medical concerns Chair/bed transfer method: Stand pivot Chair/bed transfer assist level: Maximal assist (Pt 25 - 49%/lift and lower) Chair/bed transfer assistive device: Sliding board Mechanical lift: Stedy Chair/bed transfer details: Manual facilitation for weight shifting, Verbal cues for precautions/safety, Tactile cues for posture, Tactile cues for placement, Tactile cues for weight shifting, Tactile cues for sequencing, Tactile cues for weight bearing  Function - Locomotion: Wheelchair Wheelchair activity did not occur: Safety/medical concerns Assist Level: Maximal assistance (Pt 25 - 49%) Wheel 50 feet with 2 turns activity did not occur: Safety/medical concerns Assist Level: Maximal assistance (Pt 25 - 49%) Wheel 150 feet activity did not occur: Safety/medical concerns Function - Locomotion: Ambulation Ambulation activity did not occur: Safety/medical concerns Walk 10 feet activity did not occur: Safety/medical concerns Walk 50 feet with 2 turns activity did not occur: Safety/medical concerns Walk 150 feet activity did  not occur: Safety/medical concerns Walk 10 feet on uneven surfaces activity did not occur: Safety/medical concerns  Function - Comprehension Comprehension: Auditory Comprehension assist level: Understands basic less than 25% of the time/ requires cueing >75% of the time  Function - Expression Expression: Verbal Expression assistive device: Other (Comment)([place]. ) Expression assist level: Expresses basis less than 25% of the time/requires cueing >75% of the time.  Function - Social Interaction Social Interaction assist level: Interacts appropriately less than 25% of the time. May be withdrawn or combative.  Function - Problem Solving Problem solving assist level: Solves basic less than 25% of the time - needs direction nearly all the time or does not effectively solve problems and may need a restraint for safety  Function - Memory Memory assist level: Recognizes or recalls less than 25% of the time/requires cueing greater than 75% of the time Patient normally able to recall (first 3 days only): None of the above  Medical Problem List and Plan: 1. Functional deficits secondary to  due to L-MCA stroke with extension   Aphasia with Right spastic hemiplegia   Cont CIR - PT, OT, SLP, some improvement in motor strength  2.  DVT Prophylaxis/Anticoagulation: Pharmaceutical: Lovenox 3. Pain Management: tylenol prn, knee pain RIght improving with Voltaren gel 4. Mood: LCSW to follow for evaluation when appropriate.  5. Neuropsych: This patient is not capable of making decisions on her own behalf. 6. Skin/Wound Care: Routine pressure relief measures. Maintain adequate nutritional and hydration status.  7. Fluids/Electrolytes/Nutrition: Monitor I/O.  8. T2DM: Hgb A1C- Monitor BS ac/hs and use SSI for elevated BS. Continue to titrate medications for tighter control.  CBG (last 3)  Recent Labs    07/10/17 1642 07/10/17 2052 07/11/17 0641  GLUCAP 214* 143* 94    Lantus adjusted to 35  units on 2/19 8U novalog with meals, CBG controlled 07/11/2017    9. HTN: Improved control 2/16 Vitals:   07/10/17 1500 07/11/17 0223  BP: (!) 135/52 (!) 130/46  Pulse: 77 76  Resp: 16 16  Temp: 97.6 F (36.4 C) 97.6 F (36.4 C)  SpO2: 95% 95%    Started amlodipine 2/6, Controlled 2/25     10. Fever: resolved   off Unasyn per ID. CXR neg , repeat urine neg, no DVT,recent ECHO no evidence of endocarditis no other symptoms  Hep A,B,C neg, HIV neg   Appreciate ID recs, signed off.    11.  Dysphagia: Continue dysphagia 1, nectar liquids with assistance at meals to help maintain adequate hydration.  12. Recent episode of  Depression: Now onCelexa last month.   13. Obesity             Body mass index is 32.91 kg/m.             Diet and exercise education             Will cont to encourage weight loss to increase endurance and promote overall health 14.  Hypokalemia    Supplemented x3 days   Potassium 3.2 on 2/11   Improved to 3.6 2/13, stable 2/18   15. Leukocytosis:Resolved   16. Obesity   Body mass index is 32.28 kg/m.   Diet and exercise education   Encourage weight loss to increase endurance and promote overall health 17. Hypernatremia  Resolved 18.   E. coli UTI which was resistant to Keflex.  Began Bactrim twice daily for 7 days 07/02/2017 Urinary retention, ICP add, flomax, increase Urecholine to 25 mg 3 times daily,low vol caths  LOS (Days) 21 A FACE TO FACE EVALUATION WAS PERFORMED  Charlett Blake 07/11/2017, 8:53 AM

## 2017-07-12 ENCOUNTER — Inpatient Hospital Stay (HOSPITAL_COMMUNITY): Payer: Medicare Other | Admitting: Speech Pathology

## 2017-07-12 ENCOUNTER — Inpatient Hospital Stay (HOSPITAL_COMMUNITY): Payer: Medicare Other | Admitting: Occupational Therapy

## 2017-07-12 ENCOUNTER — Inpatient Hospital Stay (HOSPITAL_COMMUNITY): Payer: Medicare Other | Admitting: Physical Therapy

## 2017-07-12 LAB — GLUCOSE, CAPILLARY
GLUCOSE-CAPILLARY: 160 mg/dL — AB (ref 65–99)
Glucose-Capillary: 143 mg/dL — ABNORMAL HIGH (ref 65–99)
Glucose-Capillary: 120 mg/dL — ABNORMAL HIGH (ref 65–99)

## 2017-07-12 NOTE — Progress Notes (Signed)
Physical Therapy Session Note  Patient Details  Name: Sandra [name] MRN: [id number] Date of Birth: 06-22-1940  Today's Date: 07/12/2017 PT Individual Time: 0900-1015 PT Individual Time Calculation (min): 75 min   Short Term Goals: Week 3:  PT Short Term Goal 1 (Week 3): =LTGs due to ELOS  Skilled Therapeutic Interventions/Progress Updates:    no c/o pain.  Session focus on following commands, initiation, and postural control with functional mobility and ADL tasks.    Pt requires supervision to roll to R and min assist to roll L for LB dressing, pt able to thread LLE with assist and pull pants over L hip with assist.  R side total assist for dressing.  Supine>long sitting with mod assist to elevate trunk and pt able to turn to EOB with mod assist for R side.  Sit<>stand in stedy with bed slightly elevated and mod assist, with pt initiating forward weight shift and power up without cues.  Stedy to transfer to w/c with pt demonstrating improved postural control/midline orientation throughout transfer.  Grooming at sink with total assist for brushing hair due to tangles.  PT washed pt's hair with shower cap and pt able to assist with hand over hand assist to initiate.  Pt positioned in w/c at end of session with call bell in reach and needs met.    Therapy Documentation Precautions:  Precautions Precautions: Fall Precaution Comments: dense R hemiparesis, R inattention Restrictions Weight Bearing Restrictions: No Other Position/Activity Restrictions: Positioning of right upper extremity in sight of patient and not pulling on it as it is flaccid.    See Function Navigator for Current Functional Status.   Therapy/Group: Individual Therapy  Michel Santee 07/12/2017, 10:49 AM

## 2017-07-12 NOTE — Progress Notes (Signed)
Social Work Patient ID: Sandra [name], female   DOB: 1940/10/04, 77 y.o.   MRN: [id number]  Spoke with [facility] Admission Coordinator who reports they will not be able to take pt. Have contacted son to inform and encouraged him to call [facility] Director of rehab if he has questions. There is another facility in [place] that can be looked at for rehab. Will await son's return call regarding talking with director and the result. He will let me know the plan form here.

## 2017-07-12 NOTE — Progress Notes (Signed)
Speech Language Pathology Weekly Progress and Session Note  Patient Details  Name: Sandra [name] MRN: [id number] Date of Birth: [birth year]  Beginning of progress report period: July 05, 2017 End of progress report period: July 12, 2017  Today's Date: 07/12/2017 SLP Individual Time: 0730-0830 SLP Individual Time Calculation (min): 60 min  Short Term Goals: Week 3: SLP Short Term Goal 1 (Week 3): Pt will answer yes/no immediate, personally relevant questions in 75% of opportunities with Mod A multimodal cues.   SLP Short Term Goal 1 - Progress (Week 3): Met SLP Short Term Goal 2 (Week 3): Pt will follow 1 step commands for 75% accuracy during basic, familiar tasks with Mod A multimodal cues.   SLP Short Term Goal 2 - Progress (Week 3): Met SLP Short Term Goal 3 (Week 3): Pt will sustain her attention to basic, familiar tasks for 5 minute intervals with Mod A verbal cues for redirection.   SLP Short Term Goal 3 - Progress (Week 3): Met SLP Short Term Goal 4 (Week 3): Pt will produce phonemes in isolation with 50% accuracy with Max A multimodal cues. SLP Short Term Goal 4 - Progress (Week 3): Met SLP Short Term Goal 5 (Week 3): Given Mod A multimodal cues, pt will select requested object from field of 2 in 75% of opportunities.  SLP Short Term Goal 5 - Progress (Week 3): Not met SLP Short Term Goal 6 (Week 3): Pt will consume dys 1 textures and thin liquids with mod verbal cues for use of swallowing precautions and minimal overt s/s of aspiration.   SLP Short Term Goal 6 - Progress (Week 3): Met    New Short Term Goals: Week 4: SLP Short Term Goal 1 (Week 4): Pt will produce functional words using intoned speech and with acceptable intelligibility in response to a question in 7 out of 10 trials.  SLP Short Term Goal 2 (Week 4): Pt will imitate the names of functional objects with 70% accuracy with Max A multimodal cues.  SLP Short Term Goal 3 (Week 4): Pt will sustain her  attention to basic, familiar tasks for 10 minute intervals with Mod A verbal cues for redirection.   SLP Short Term Goal 4 (Week 4): Given Mod A multimodal cues, pt will select requested object from field of 2 in 75% of opportunities.  SLP Short Term Goal 5 (Week 4): Pt will consume trials of dys. 2 textures with efficient mastication and complete oral clearance with Min A verbal cues over two sessions prior to upgrade.  SLP Short Term Goal 6 (Week 4): Pt will consume dys 1 textures and thin liquids with Min A verbal cues for use of swallowing precautions and minimal overt s/s of aspiration.     Weekly Progress Updates: Patient continues to make functional gains and has met 5 out of 6 STG's this reporting period. Currently, pt requires overall Mod A multimodal cues to follow 1-step commands and answer yes/no personally relevant questions. Pt demonstrates sustained attention to tasks for 5-minute intervals with Mod A verbal cues for redirection. Given Max A multimodal cues, pt is able to produce /a/ in isolation and name minimal common objects with 50% accuracy. Given two objects in field, pt tends to select the one on the left side; therefore, requires Max A verbal cues to self-monitor and correct the errors. Pt consumes dys. 1 textures and thin liquids without overt s/s of aspiration. However, pt continues to require Mod A verbal cues for use of swallowing  compensatory strategies and problem solving during self-feeding. Will attempt trials of dys. 2 textures this week.  Patient and family education is ongoing. Patient would benefit from continued skilled SLP intervention to maximize her cognitive and swallowing function as well as functional communication in order to maximize her overall functional independence prior to discharge.     Intensity: Minumum of 1-2 x/day, 30 to 90 minutes Frequency: 3 to 5 out of 7 days Duration/Length of Stay: TBD due to SNF placement Treatment/Interventions: Cognitive  remediation/compensation;Cueing hierarchy;Dysphagia/aspiration precaution training;Functional tasks;Environmental controls;Internal/external aids;Multimodal communication approach;Patient/family education;Speech/Language facilitation   Daily Session  Skilled Therapeutic Interventions: Skilled treatment session focused on dysphagia, cognitive, and communication goals. Upon arrival, pt was supine in bed and incontinent of bowel. Pt required Min A verbal cues to follow commands during bed mobility while changing brief. SLP further facilitated session by providing skilled observation of pt consuming breakfast. Pt consumed dys. 1 textures with thin liquids without overt s/s of aspiration and required Mod A verbal cues for use of swallowing compensatory strategies and problem solving for self-feeding. Given Min A verbal cues, pt also intonated her own name using Melodic Intonation Therapy (MIT) independently with 80% accuracy and "my baby" with 50% accuracy. Pt spontaneously verbalized "okay" x1, "no" x1, and "bye" x1. Pt left supine in bed with alarm on and all needs within reach. Continue with current plan of care.      Function:   Eating Eating   Modified Consistency Diet: Yes Eating Assist Level: Hand over hand assist;Help managing cup/glass;Set up assist for;Supervision or verbal cues;Helper scoops food on utensil   Eating Set Up Assist For: Opening containers       Cognition Comprehension Comprehension assist level: Understands basic less than 25% of the time/ requires cueing >75% of the time  Expression   Expression assist level: Expresses basis less than 25% of the time/requires cueing >75% of the time.  Social Interaction Social Interaction assist level: Interacts appropriately less than 25% of the time. May be withdrawn or combative.  Problem Solving Problem solving assist level: Solves basic less than 25% of the time - needs direction nearly all the time or does not effectively solve  problems and may need a restraint for safety  Memory Memory assist level: Recognizes or recalls less than 25% of the time/requires cueing greater than 75% of the time    Pain Pain assessment Pain assessment: No/denies pain   Therapy/Group: Individual Therapy  Meredeth Ide  SLP - Student 07/12/2017, 3:43 PM

## 2017-07-12 NOTE — Progress Notes (Signed)
Occupational Therapy Session Note  Patient Details  Name: Ronasia Isola MRN: [id number] Date of Birth: Jan 30, 1941  Today's Date: 07/12/2017 OT Individual Time: 1435-1535 OT Individual Time Calculation (min): 60 min   Short Term Goals: Week 3:  OT Short Term Goal 1 (Week 3): STG=LTG 2/2 ELOS  Skilled Therapeutic Interventions/Progress Updates:    Pt greeted semi-reclined in bed and incontinent of stool. Pt rolled L and R with close supervision/min A for total A peri-care and brief change. Pt then came to sitting EOB with min A. Sit<>stand in [place] with Mod A, then transferred to wc with Stedy. Slide board transfer wc to therapy mat with Max A to R. Worked on sitting balance and trunk extension with improved righting reactions. Pt transferred back to wc via slide board with mod A to L. Pt left tilted in wc with safety belt on and needs met.   Therapy Documentation Precautions:  Precautions Precautions: Fall Precaution Comments: dense R hemiparesis, R inattention Restrictions Weight Bearing Restrictions: No Other Position/Activity Restrictions: Positioning of right upper extremity in sight of patient and not pulling on it as it is flaccid.  Pain: Pain Assessment Pain Assessment: No/denies pain  See Function Navigator for Current Functional Status.  Therapy/Group: Individual Therapy  Valma Cava 07/12/2017, 3:41 PM

## 2017-07-12 NOTE — Discharge Summary (Addendum)
Physician Discharge Summary  Patient ID: Sandra [name] MRN: [id number] DOB/AGE: 79-07-1940 79 y.o.  Admit date: 06/20/2017 Discharge date: 07/14/2017  Discharge Diagnoses:  Principal Problem:   Acute ischemic right MCA stroke [facility]) Active Problems:   Dysphagia   Type 2 diabetes mellitus with neurological complications (HCC)   Hemiparesthesia   Hemiparesis of right nondominant side as late effect of cerebral infarction [facility])   Urinary retention   Global aphasia   Discharged Condition: stable   Significant Diagnostic Studies: Dg Chest 2 View  Result Date: 06/20/2017 CLINICAL DATA:  Stroke.  Concerns of aspiration or pneumonia. EXAM: CHEST  2 VIEW COMPARISON:  June 19, 2017 FINDINGS: Stable cardiomegaly. The hila and mediastinum are unchanged. No pulmonary nodules or masses. No focal infiltrates. Suggested pulmonary venous congestion. IMPRESSION: No focal infiltrate.  Pulmonary venous congestion. Electronically Signed   By: Dorise Bullion III M.D   On: 06/20/2017 19:23 Dg Chest Port 1 View  Result Date: 06/19/2017 CLINICAL DATA:  Fever EXAM: PORTABLE CHEST 1 VIEW COMPARISON:  None. FINDINGS: There is mild bilateral interstitial thickening. There is no focal consolidation. There is no pleural effusion or pneumothorax. The heart and mediastinal contours are unremarkable. The osseous structures are unremarkable. IMPRESSION: Bilateral interstitial thickening which may reflect interstitial infection versus mild interstitial edema. Electronically Signed   By: Kathreen Devoid   On: 06/19/2017 10:37   Dg Swallowing Func-speech Pathology  Result Date: 06/27/2017 Objective Swallowing Evaluation: Type of Study: MBS-Modified Barium Swallow Study  Patient Details Name: Sandra [name] MRN: [id number] Date of Birth: 1941/05/04 Today's Date: 06/27/2017 Time: SLP Start Time (ACUTE ONLY): 1115 -SLP Stop Time (ACUTE ONLY): 1145 SLP Time Calculation (min) (ACUTE ONLY): 30 min Past Medical History: Past Medical  History: Diagnosis Date . Colon polyp  . Constipation  . Diabetes mellitus without complication ([place])  . GI bleed  . Hemorrhoids  . High cholesterol  . Hypertension  . PAD (peripheral artery disease) ([place])  . Peripheral neuropathy  . Rectal bleeding   "for 7 years" . Rectal bleeding  . Stroke [facility])  Past Surgical History: Past Surgical History: Procedure Laterality Date . ABDOMINAL HYSTERECTOMY   . COLONOSCOPY    X 7 . COLONOSCOPY N/A 03/21/2017  Procedure: COLONOSCOPY;  Surgeon: Danie Binder, MD;  Location: [facility];  Service: Endoscopy;  Laterality: N/A;  1215  HPI: 77 y.o.female,w hypertension, hyperlipidemia, Dm2, CVA, apparently not taking bp medication for the past 2 weeks presents with dysarthria and aphasia. Pt is not taking aspirin at home. Per teleneurology last known well 1200 on 06/14/2017. Pt has difficulty with comprehension as well it appears.The patient was noted to have an acute CVA by MRI study. Dr. Merlene Laughter notes: The MRI shows a faintly increased signal on DWI involving the left frontal and temporal region. The faintness suggests more of a subacute onset and not in acute event. Pt passed RN swallow screen and SLE ordered as part of stroke protocol. Pt's son is a physical therapist here at [facility].  Subjective: Pt nonverbal today Assessment / Plan / Recommendation CHL IP CLINICAL IMPRESSIONS 06/27/2017 Clinical Impression Pt presents with moderate to severe oral phase and mild phayrngeal phase dysphagia. Pt's oral phase appears related to cognitive deficits of inattention to oral boluses, ineffective oral containment leading to premature spillage of all boluses consumed. With puree boluses, pt with no lingual manipulation of puree, cognitive inability to perform any compensatory strategies (including presentation of dry spoon) to increase posterior progress of bolus. Pt with mild  sensory based pharyngeal impairments c/b delayed swallow initiation to vallucula with nectar thick and  puree. Pt with further delayed as bolus falls from vallucula to pyriform sinuses. During transition from vallecula to pyriform sinuses, pt with penetration before and during swallow which eventually resulted in silent aspiration. Pt with no penentration or aspiration when consuming small controlled sips of thin liquids - however pt unable to control bolus size herself and required hand over hand during study to control cupand bolus size. Max A chin was effective in eliminating penetration if pt could perform at some point. In the meantime, recommend upgrade to thin liquids via Provale cup to restrict bolus size to 5 cc, continue puree diet. Of note, previous MBSS reviewed and pt's swallow appears more impaired on today's study with no functional improvements as initial MBSS recommended water protocol as pt was safe with water inbetween meals. Prognosis for further improvement is poor given pt severity of pt's global aphasia and cognitive deficits.  SLP Visit Diagnosis Dysphagia, oropharyngeal phase (R13.12) Attention and concentration deficit following -- Frontal lobe and executive function deficit following Cerebral infarction Impact on safety and function Moderate aspiration risk   CHL IP TREATMENT RECOMMENDATION 06/27/2017 Treatment Recommendations Therapy as outlined in treatment plan below   Prognosis 06/20/2017 Prognosis for Safe Diet Advancement Fair Barriers to Reach Goals Language deficits;Severity of deficits Barriers/Prognosis Comment -- CHL IP DIET RECOMMENDATION 06/27/2017 SLP Diet Recommendations Dysphagia 1 (Puree) solids;Thin liquid Liquid Administration via Other (Comment) Medication Administration Crushed with puree Compensations Minimize environmental distractions;Slow rate;Small sips/bites;Lingual sweep for clearance of pocketing Postural Changes Seated upright at 90 degrees   CHL IP OTHER RECOMMENDATIONS 06/27/2017 Recommended Consults -- Oral Care Recommendations Oral care before and after  PO;Staff/trained caregiver to provide oral care Other Recommendations --   CHL IP FOLLOW UP RECOMMENDATIONS 06/20/2017 Follow up Recommendations Inpatient Rehab   CHL IP FREQUENCY AND DURATION 06/20/2017 Speech Therapy Frequency (ACUTE ONLY) min 2x/week Treatment Duration 1 week      CHL IP ORAL PHASE 06/27/2017 Oral Phase Impaired Oral - Pudding Teaspoon -- Oral - Pudding Cup -- Oral - Honey Teaspoon -- Oral - Honey Cup -- Oral - Nectar Teaspoon WFL Oral - Nectar Cup Weak lingual manipulation;Right anterior bolus loss;Impaired mastication;Lingual pumping;Reduced posterior propulsion;Delayed oral transit;Premature spillage;Decreased bolus cohesion Oral - Nectar Straw NT Oral - Thin Teaspoon Right anterior bolus loss;Decreased bolus cohesion;Premature spillage Oral - Thin Cup Right anterior bolus loss;Weak lingual manipulation;Decreased bolus cohesion;Delayed oral transit;Premature spillage Oral - Thin Straw NT Oral - Puree Lingual pumping;Weak lingual manipulation;Impaired mastication;Reduced posterior propulsion;Holding of bolus;Delayed oral transit;Decreased bolus cohesion;Premature spillage Oral - Mech Soft NT Oral - Regular NT Oral - Multi-Consistency -- Oral - Pill NT Oral Phase - Comment --  CHL IP PHARYNGEAL PHASE 06/27/2017 Pharyngeal Phase Impaired Pharyngeal- Pudding Teaspoon -- Pharyngeal -- Pharyngeal- Pudding Cup -- Pharyngeal -- Pharyngeal- Honey Teaspoon -- Pharyngeal -- Pharyngeal- Honey Cup -- Pharyngeal -- Pharyngeal- Nectar Teaspoon Delayed swallow initiation-vallecula Pharyngeal -- Pharyngeal- Nectar Cup Delayed swallow initiation-vallecula;Penetration/Aspiration before swallow;Penetration/Aspiration during swallow Pharyngeal Material enters airway, remains ABOVE vocal cords then ejected out;Other (Comment) Pharyngeal- Nectar Straw NT Pharyngeal -- Pharyngeal- Thin Teaspoon Delayed swallow initiation-vallecula;Delayed swallow initiation-pyriform sinuses;Pharyngeal residue - pyriform Pharyngeal --  Pharyngeal- Thin Cup Delayed swallow initiation-vallecula;Delayed swallow initiation-pyriform sinuses;Penetration/Aspiration before swallow;Penetration/Aspiration during swallow;Moderate aspiration Pharyngeal Material enters airway, passes BELOW cords without attempt by patient to eject out (silent aspiration) Pharyngeal- Thin Straw NT Pharyngeal -- Pharyngeal- Puree Delayed swallow initiation-vallecula Pharyngeal -- Pharyngeal- Mechanical Soft NT Pharyngeal --  Pharyngeal- Regular -- Pharyngeal -- Pharyngeal- Multi-consistency -- Pharyngeal -- Pharyngeal- Pill NT Pharyngeal -- Pharyngeal Comment --  CHL IP CERVICAL ESOPHAGEAL PHASE 06/27/2017 Cervical Esophageal Phase WFL Pudding Teaspoon -- Pudding Cup -- Honey Teaspoon -- Honey Cup -- Nectar Teaspoon -- Nectar Cup -- Nectar Straw -- Thin Teaspoon -- Thin Cup -- Thin Straw -- Puree -- Mechanical Soft -- Regular -- Multi-consistency -- Pill -- Cervical Esophageal Comment -- No flowsheet data found. Sandra [name] 06/27/2017, 11:17 AM               Labs:  Basic Metabolic Panel: BMP Latest Ref Rng & Units 07/11/2017 07/04/2017 06/29/2017  Glucose 65 - 99 mg/dL 100(H) 124(H) 157(H)  BUN 6 - 20 mg/dL 14 16 29(H)  Creatinine 0.44 - 1.00 mg/dL 0.62 0.72 0.66  BUN/Creat Ratio 6 - 22 (calc) - - -  Sodium 135 - 145 mmol/L 138 143 150(H)  Potassium 3.5 - 5.1 mmol/L 4.1 3.6 3.6  Chloride 101 - 111 mmol/L 101 106 112(H)  CO2 22 - 32 mmol/L 28 29 28   Calcium 8.9 - 10.3 mg/dL 8.5(L) 8.4(L) 8.8(L)    CBC: CBC Latest Ref Rng & Units 07/14/2017 07/11/2017 07/04/2017  WBC 4.0 - 10.5 K/uL 6.4 5.8 10.8(H)  Hemoglobin 12.0 - 15.0 g/dL 13.0 12.1 12.8  Hematocrit 36.0 - 46.0 % 40.3 37.8 39.8  Platelets 150 - 400 K/uL 264 305 402(H)    CBG: Recent Labs  Lab 07/13/17 0629 07/13/17 1120 07/13/17 1651 07/14/17 0631 07/14/17 0701  GLUCAP 87 177* 138* 61* 98    Brief HPI:   BeatriceWatkinsis a76 y.o.female with history of HTN, T2DM, diverticulosis, recent issues  with depression and had stopped taking all medication except insulin for few weeks. She was admitted to [facility] on 06/15/17 with elevated BP, confusion and 2 week history of dysarthria. 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  MRI/MRA brain done revealing moderate size L-MCA infarct affecting posterior lateral frontal lobe and multiple missing L-MCA branch vessels and moderately advanced chronic SVD.  Swallow evaluation done and patient placed on dysphagia 1, nectar liquids with water protocol.  She had decline in mental status with fever 2/2 and was started on Unasyn due to concerns of aspiration PNA. Follow up CT head without change. Patient with resultant dense right hemiparesis, expressive/receptive aphasia, dysphagia and  MD and Rehab CM reporting that patient much improved this am and she was cleared medically to start [facility] Course: Sandra [name] was admitted to rehab 06/20/2017 for inpatient therapies to consist of PT, ST and OT at least three hours five days a week. Past admission physiatrist, therapy team and rehab RN have worked together to provide customized collaborative inpatient rehab.  She continued to have fevers with leucocytosis despite Unasyn on board. Follow up CXR showed no signs of infection and UCS was negative. BLE dopplers were negative for DVT ID was consulted for input on 2/6 and recommended discontinuation of antibiotics. BUE dopplers were ordered for work up and were negative. No further work up needed and She defervesced off antibiotics and ID signed off. She was maintained on ASA and Plavix during her stay. She is tolerating this without SE. Serial CBC showed platelets are stable and reactive leucocytosis has resolved.    Blood pressures were noted to be labile and medications were titrated  for tighter control.  Po intake was poor at admission due to mentation as well as dysphagia diet. She developed AKI with hypernatremia due to poor intake and was started pm IVF till liquids liberalized to thins. She continues to require nutritional supplement due to variable intake. Lytes have been monitored with serial checks and showed that hypernatremia as well as AKI has resolved.  Diabetes has been monitored with ac/hs CBG checks.   Lantus has been titrated upwards with meal coverage for tighter control.  She has had issues with urinary retention therefore flomax and urecholine were added to help with voiding function. Bladder scan were ordered to monitor voiding/PVRs and show volumes down to 40-130 cc. Recommend continued attempts at toileting every 4 hours as she is incontinent of bowel and bladder.  Her progress has been slow and she continues to be limited by dense right hemiparesis with right inattention and global aphasia. Family is unable to provide extensive care that's needed and has elected on SNF for follow up therapy. She was discharged to [facility] on 07/14/17.   Rehab course: During patient's stay in rehab weekly team conferences were held to monitor patient's progress, set goals and discuss barriers to discharge. At admission, patient required total assist with basic self care tasks and mobility. She required dysphagia 1, nectar liquids and needed assistance with meals for adequate intake. She displayed significant right facial weakness, dysphagia and severe global aphasia requiring max to total assist to answer simple Y/N questions and to follow one step commands.  She  has had improvement in activity tolerance, balance, postural control as well as ability to compensate for deficits. She has had improvement in functional use and awareness of RUE  and RLE.  She requires min cues with supervision for awareness of left lean. She is able to complete bathing with supervision and  occasionally requires hand over hand assist. She requires mod assist with HOB elevated for bed mobility and is able to maintain dynamic sitting balance with max cues. She requires mod assist + 2 with steady for transfers. She is able to consume dysphagia 1, thin liquids with mod cues for safe swallowing strategies. She requires max multimodal cues to name common objects with 70% accuracy and is intermittently respond spontaneously to questions/statements. She is able to sustain attention to tasks for 5 minutes with mod verbal cues.      Disposition:  Skilled Nursing facility.   Diet: Dysphagia 1, thin liquids with Provale cup (or by teaspoon).   Special Instructions: 1. Needs supervision at meals for safety. Assist/feed patient for adequate intake. Medications curshed in puree.  2. Continue ASA/Plavix for 3 months total followed by Plavix alone.  Discharge Instructions    Ambulatory referral to Physical Medicine Rehab   Complete by:  As directed    4 weeks followup appointment     Allergies as of 07/14/2017      Reactions   Hctz [hydrochlorothiazide] Other (See Comments)   Caused pancreatitis flare up   Morphine And Related Other (See Comments)   dizziness   Valium [diazepam] Other (See Comments)   Confusion, blurred vision      Medication List    STOP taking these medications   amoxicillin-clavulanate 875-125 MG tablet Commonly known as:  AUGMENTIN   ergocalciferol 8000 UNIT/ML drops Commonly known as:  DRISDOL   insulin aspart 100 UNIT/ML injection Commonly known as:  novoLOG   losartan 50 MG tablet Commonly known as:  COZAAR   metFORMIN 500 MG 24 hr tablet Commonly  known as:  GLUCOPHAGE XR   metoprolol succinate 50 MG 24 hr tablet Commonly known as:  TOPROL-XL   polyethylene glycol powder powder Commonly known as:  GLYCOLAX/MIRALAX     TAKE these medications   acetaminophen 325 MG tablet Commonly known as:  TYLENOL Take 1-2 tablets (325-650 mg total) by mouth  every 4 (four) hours as needed for mild pain. What changed:    how much to take  reasons to take this   amLODipine 10 MG tablet Commonly known as:  NORVASC Take 1 tablet (10 mg total) by mouth daily. What changed:    medication strength  how much to take   aspirin 325 MG EC tablet Take 1 tablet (325 mg total) by mouth daily. What changed:    medication strength  how much to take   atorvastatin 40 MG tablet Commonly known as:  LIPITOR Take 1 tablet (40 mg total) by mouth daily at 6 PM.   bethanechol 25 MG tablet Commonly known as:  URECHOLINE Take 1 tablet (25 mg total) by mouth 3 (three) times daily.   citalopram 10 MG tablet Commonly known as:  CELEXA Take 10 mg by mouth daily.   clopidogrel 75 MG tablet Commonly known as:  PLAVIX Take 1 tablet (75 mg total) by mouth daily with breakfast.   insulin glargine 100 UNIT/ML injection Commonly known as:  LANTUS Inject 0.35 mLs (35 Units total) into the skin daily. What changed:  how much to take   ONE-A-DAY 50 PLUS PO Take 1 tablet by mouth daily.   protein supplement shake Liqd Commonly known as:  PREMIER PROTEIN Take 59.1 mLs (2 oz total) by mouth 4 (four) times daily.   senna-docusate 8.6-50 MG tablet Commonly known as:  Senokot-S Take 2 tablets by mouth at bedtime.   tamsulosin 0.4 MG Caps capsule Commonly known as:  FLOMAX Take 1 capsule (0.4 mg total) by mouth daily after supper.   vitamin B-12 1000 MCG tablet Commonly known as:  CYANOCOBALAMIN Take 1,000 mcg by mouth daily.       Contact information for follow-up providers    Kirsteins, Luanna Salk, MD Follow up.   Specialty:  Physical Medicine and Rehabilitation Why:  office will call you with follow up appointment Contact information: [place] [address] [phone]        Phillips Odor, MD. Call in 1 day(s).   Specialty:  Neurology Why:  for follow up appointment in 4-6 weeks.  Contact information: [address] [place] [address] [phone]            Contact information for after-discharge care    Destination    [facility] .   Service:  Skilled Nursing Contact information: [address] [place] [phone]                  Signed: Bary Leriche 07/14/2017, 10:18 AM

## 2017-07-13 ENCOUNTER — Ambulatory Visit: Payer: Medicare Other | Admitting: Family Medicine

## 2017-07-13 ENCOUNTER — Inpatient Hospital Stay (HOSPITAL_COMMUNITY): Payer: Medicare Other | Admitting: Speech Pathology

## 2017-07-13 ENCOUNTER — Encounter (HOSPITAL_COMMUNITY): Payer: Medicare Other | Admitting: Physical Therapy

## 2017-07-13 ENCOUNTER — Inpatient Hospital Stay (HOSPITAL_COMMUNITY): Payer: Medicare Other

## 2017-07-13 ENCOUNTER — Inpatient Hospital Stay (HOSPITAL_COMMUNITY): Payer: Medicare Other | Admitting: Physical Therapy

## 2017-07-13 DIAGNOSIS — R339 Retention of urine, unspecified: Secondary | ICD-10-CM

## 2017-07-13 DIAGNOSIS — R4701 Aphasia: Secondary | ICD-10-CM

## 2017-07-13 DIAGNOSIS — R7309 Other abnormal glucose: Secondary | ICD-10-CM

## 2017-07-13 LAB — GLUCOSE, CAPILLARY
GLUCOSE-CAPILLARY: 138 mg/dL — AB (ref 65–99)
Glucose-Capillary: 87 mg/dL (ref 65–99)
Glucose-Capillary: 177 mg/dL — ABNORMAL HIGH (ref 65–99)

## 2017-07-13 NOTE — Plan of Care (Signed)
  RH Bed Mobility LTG Patient will perform bed mobility with assist (PT) Description LTG: Patient will perform bed mobility with assistance, with/without cues (PT). 07/13/2017 2007 - Progressing by Michel Santee, PT  Pt requires up to min assist for rolling L, supervision for rolling R, and mod assist with bed features to come supine<>sit

## 2017-07-13 NOTE — Progress Notes (Signed)
Physical Therapy Session Note  Patient Details  Name: Sandra [name] MRN: [id number] Date of Birth: 1940/11/27  Today's Date: 07/13/2017 PT Co-Treatment Time: 0945-1030 PT Co-Treatment Time Calculation (min): 45 min  Short Term Goals: Week 3:  PT Short Term Goal 1 (Week 3): =LTGs due to ELOS  Skilled Therapeutic Interventions/Progress Updates:    Skilled co-treat with OT, with PT focus on transfers, dynamic sitting balance, and standing tolerance/posture during ADLs and functional mobility retraining.    Pt requires mod assist to come to sitting EOB with HOB elevated.  Sit<>stand in stedy with mod assist for transfer bed>w/c>tub bench.  Pt requires supervision>min assist for dynamic sitting balance for bathing at shower level.  Intermitted pt initiation of RUE use in functional tasks.  Grooming and dressing at sink with min assist for dynamic sitting balance and forced weightbearing through RUE for NMR during lower body dressing task.  Sit<>stand at the sink with mod assist, total assist to pull pants over hips.  Repeated sit<>stand facing rail at dayroom, x4 trials with mod<>max assist focus on hip extension/upright posture and standing tolerance. Pt returned to room at end of session and positioned in tilt in space w/c with seatbelt in place, call bell in reach and needs met.   Therapy Documentation Precautions:  Precautions Precautions: Fall Precaution Comments: dense R hemiparesis, R inattention Restrictions Weight Bearing Restrictions: No Other Position/Activity Restrictions: Positioning of right upper extremity in sight of patient and not pulling on it as it is flaccid.    See Function Navigator for Current Functional Status.   Therapy/Group: Individual Therapy  Michel Santee 07/13/2017, 10:51 AM

## 2017-07-13 NOTE — Progress Notes (Signed)
Subjective/Complaints: Pt seen lying in bed this AM working with SLP.  Aphasic.  ROS: Limited due to language/communication   Objective: Vital Signs: Blood pressure 125/65, pulse 80, temperature 98 F (36.7 C), temperature source Oral, resp. rate 18, height _0  (1.651 m), weight 83.3 kg (183 lb 10.3 oz), SpO2 98 %.  Results for orders placed or performed during the hospital encounter of 06/20/17 (from the past 72 hour(s))  Glucose, capillary     Status: Abnormal   Collection Time: 07/10/17  4:42 PM  Result Value Ref Range   Glucose-Capillary 214 (H) 65 - 99 mg/dL  Glucose, capillary     Status: Abnormal   Collection Time: 07/10/17  8:52 PM  Result Value Ref Range   Glucose-Capillary 143 (H) 65 - 99 mg/dL   Comment 1 Notify RN   CBC     Status: None   Collection Time: 07/11/17  6:05 AM  Result Value Ref Range   WBC 5.8 4.0 - 10.5 K/uL   RBC 4.31 3.87 - 5.11 MIL/uL   Hemoglobin 12.1 12.0 - 15.0 g/dL   HCT 37.8 36.0 - 46.0 %   MCV 87.7 78.0 - 100.0 fL   MCH 28.1 26.0 - 34.0 pg   MCHC 32.0 30.0 - 36.0 g/dL   RDW 14.7 11.5 - 15.5 %   Platelets 305 150 - 400 K/uL    Comment: Performed at [facility], [place] [address], [place], [place] [address]  Basic metabolic panel     Status: Abnormal   Collection Time: 07/11/17  6:05 AM  Result Value Ref Range   Sodium 138 135 - 145 mmol/L   Potassium 4.1 3.5 - 5.1 mmol/L   Chloride 101 101 - 111 mmol/L   CO2 28 22 - 32 mmol/L   Glucose, Bld 100 (H) 65 - 99 mg/dL   BUN 14 6 - 20 mg/dL   Creatinine, Ser 0.62 0.44 - 1.00 mg/dL   Calcium 8.5 (L) 8.9 - 10.3 mg/dL   GFR calc non Af Amer >60 >60 mL/min   GFR calc Af Amer >60 >60 mL/min    Comment: (NOTE) The eGFR has been calculated using the CKD EPI equation. This calculation has not been validated in all clinical situations. eGFR's persistently <60 mL/min signify possible Chronic Kidney Disease.    Anion gap 9 5 - 15    Comment: Performed at [facility] [address] [place], [place] [address]  Glucose, capillary     Status: None   Collection Time: 07/11/17  6:41 AM  Result Value Ref Range   Glucose-Capillary 94 65 - 99 mg/dL  Glucose, capillary     Status: Abnormal   Collection Time: 07/11/17 12:00 PM  Result Value Ref Range   Glucose-Capillary 200 (H) 65 - 99 mg/dL  Glucose, capillary     Status: None   Collection Time: 07/11/17  5:11 PM  Result Value Ref Range   Glucose-Capillary 71 65 - 99 mg/dL  Glucose, capillary     Status: Abnormal   Collection Time: 07/11/17  9:23 PM  Result Value Ref Range   Glucose-Capillary 156 (H) 65 - 99 mg/dL  Glucose, capillary     Status: Abnormal   Collection Time: 07/12/17 11:30 AM  Result Value Ref Range   Glucose-Capillary 143 (H) 65 - 99 mg/dL  Glucose, capillary     Status: Abnormal   Collection Time: 07/12/17  5:24 PM  Result Value Ref Range   Glucose-Capillary 120 (  H) 65 - 99 mg/dL  Glucose, capillary     Status: Abnormal   Collection Time: 07/12/17  9:32 PM  Result Value Ref Range   Glucose-Capillary 160 (H) 65 - 99 mg/dL  Glucose, capillary     Status: None   Collection Time: 07/13/17  6:29 AM  Result Value Ref Range   Glucose-Capillary 87 65 - 99 mg/dL  Glucose, capillary     Status: Abnormal   Collection Time: 07/13/17 11:20 AM  Result Value Ref Range   Glucose-Capillary 177 (H) 65 - 99 mg/dL     HEENT: Normocephalic. Atraumatic. Cardio: RRR. No JVD .Marland Kitchen Resp: CTA Bilaterally. Normal effort   GI: BS positive and ND Musculoskeletal:  No tenderness and No Edema Skin:   Warm and dry. Intact. Neuro: Right upper extremity 2-/5 biceps triceps trace grip, 2-/5 hip knee extensor synergy 0/5 foot and ankle Awake Difficultly following commands, but improving Expressive>Receptive Aphasia Gen NAD. Vital signs reviewed.  Psych: Limited due to a aphasia   Assessment/Plan: 1. Functional deficits secondary to Left MCA infarct with R HP, Aphasia which require 3+ hours per day of interdisciplinary  therapy in a comprehensive inpatient rehab setting. Physiatrist is providing close team supervision and 24 hour management of active medical problems listed below. Physiatrist and rehab team continue to assess barriers to discharge/monitor patient progress toward functional and medical goals. FIM: Function - Bathing Position: Shower Body parts bathed by patient: Right arm, Chest, Abdomen, Right upper leg, Left upper leg, Left lower leg Body parts bathed by helper: Right lower leg, Back, Buttocks, Front perineal area, Left arm Assist Level: Touching or steadying assistance(Pt > 75%)  Function- Upper Body Dressing/Undressing What is the patient wearing?: Pull over shirt/dress Bra - Perfomed by helper: Thread/unthread right bra strap, Thread/unthread left bra strap, Hook/unhook bra (pull down sports bra) Pull over shirt/dress - Perfomed by patient: Thread/unthread left sleeve, Put head through opening Pull over shirt/dress - Perfomed by helper: Pull shirt over trunk, Thread/unthread right sleeve Assist Level: Touching or steadying assistance(Pt > 75%) Function - Lower Body Dressing/Undressing What is the patient wearing?: Pants Position: Wheelchair/chair at sink Pants- Performed by patient: Thread/unthread left pants leg, Pull pants up/down Pants- Performed by helper: Pull pants up/down, Thread/unthread right pants leg Non-skid slipper socks- Performed by helper: Don/doff right sock, Don/doff left sock Socks - Performed by helper: Don/doff right sock, Don/doff left sock Shoes - Performed by helper: Don/doff right shoe, Don/doff left shoe(slip on) Assist for footwear: Dependant Assist for lower body dressing: (Max A)  Function - Toileting Toileting activity did not occur: No continent bowel/bladder event Toileting steps completed by helper: Adjust clothing prior to toileting, Performs perineal hygiene, Adjust clothing after toileting Toileting Assistive Devices: Grab bar or rail Assist  level: Touching or steadying assistance (Pt.75%)  Function - Air cabin crew transfer activity did not occur: Safety/medical concerns Toilet transfer assistive device: Mechanical lift Mechanical lift: Stedy Assist level to toilet: 2 helpers Assist level from toilet: 2 helpers  Function - Chair/bed transfer Chair/bed transfer activity did not occur: Safety/medical concerns Chair/bed transfer method: Other Chair/bed transfer assist level: dependent (Pt equals 0%) Chair/bed transfer assistive device: Mechanical lift Mechanical lift: Stedy Chair/bed transfer details: Manual facilitation for weight shifting, Verbal cues for precautions/safety, Tactile cues for posture, Tactile cues for placement, Tactile cues for weight shifting, Tactile cues for sequencing, Tactile cues for weight bearing, Manual facilitation for placement  Function - Locomotion: Wheelchair Wheelchair activity did not occur: Safety/medical concerns Assist Level: Maximal  assistance (Pt 25 - 49%) Wheel 50 feet with 2 turns activity did not occur: Safety/medical concerns Assist Level: Maximal assistance (Pt 25 - 49%) Wheel 150 feet activity did not occur: Safety/medical concerns Function - Locomotion: Ambulation Ambulation activity did not occur: Safety/medical concerns Walk 10 feet activity did not occur: Safety/medical concerns Walk 50 feet with 2 turns activity did not occur: Safety/medical concerns Walk 150 feet activity did not occur: Safety/medical concerns Walk 10 feet on uneven surfaces activity did not occur: Safety/medical concerns  Function - Comprehension Comprehension: Auditory Comprehension assist level: Understands basic less than 25% of the time/ requires cueing >75% of the time  Function - Expression Expression: Verbal Expression assistive device: Other (Comment)([place]. ) Expression assist level: Expresses basis less than 25% of the time/requires cueing >75% of the time.  Function - Social  Interaction Social Interaction assist level: Interacts appropriately less than 25% of the time. May be withdrawn or combative.  Function - Problem Solving Problem solving assist level: Solves basic less than 25% of the time - needs direction nearly all the time or does not effectively solve problems and may need a restraint for safety  Function - Memory Memory assist level: Recognizes or recalls less than 25% of the time/requires cueing greater than 75% of the time Patient normally able to recall (first 3 days only): None of the above  Medical Problem List and Plan: 1. Functional deficits secondary to  due to L-MCA stroke with extension   Aphasia with Right spastic hemiplegia   Cont CIR   Cognition slightly improved 2.  DVT Prophylaxis/Anticoagulation: Pharmaceutical: Lovenox 3. Pain Management: tylenol prn, knee pain RIght improving with Voltaren gel 4. Mood: LCSW to follow for evaluation when appropriate.  5. Neuropsych: This patient is not capable of making decisions on her own behalf. 6. Skin/Wound Care: Routine pressure relief measures. Maintain adequate nutritional and hydration status.  7. Fluids/Electrolytes/Nutrition: Monitor I/O.  8. T2DM: Hgb A1C- Monitor BS ac/hs and use SSI for elevated BS. Continue to titrate medications for tighter control.  CBG (last 3)  Recent Labs    07/12/17 2132 07/13/17 0629 07/13/17 1120  GLUCAP 160* 87 177*    Lantus adjusted to 35 units on 2/19 8U novalog with meals,    Labile on 2/27    9. HTN: Improved control 2/16 Vitals:   07/12/17 1500 07/13/17 0252  BP: 130/72 125/65  Pulse: 78 80  Resp: 19 18  Temp: 98 F (36.7 C) 98 F (36.7 C)  SpO2: 99% 98%    Started amlodipine 2/6,    Controlled 2/27   10. Fever: resolved   off Unasyn per ID. CXR neg , repeat urine neg, no DVT,recent ECHO no evidence of endocarditis no other symptoms  Hep A,B,C neg, HIV neg   Appreciate ID recs, signed off.    11. Dysphagia: Continue dysphagia 1,  thin liquids  12. Recent episode of  Depression: Now onCelexa  13. Obesity             Body mass index is 32.91 kg/m.             Diet and exercise education             Encourage weight loss to increase endurance and promote overall health 14.  Hypokalemia    Supplemented x3 days   Potassium 4.1 on 2/15 15. Leukocytosis:Resolved 16. Hypernatremia  Resolved 17.   E. coli UTI which was resistant to Keflex.     Completed Bactrim  18. Urinary retention, ICP add, flomax, increased Urecholine to 25 mg 3 times daily  LOS (Days) 23 A FACE TO FACE EVALUATION WAS PERFORMED  Bowyn Mercier Lorie Phenix 07/13/2017, 11:51 AM

## 2017-07-13 NOTE — Progress Notes (Signed)
Physical Therapy Discharge Summary  Patient Details  Name: Sandra [name] MRN: [id number] Date of Birth: 1941-05-06  Today's Date: 07/13/2017 PT Individual Time: 9326-7124 PT Individual Time Calculation (min): 40 min    Patient has met 3 of 4 long term goals due to improved activity tolerance, improved balance, improved postural control and improved attention.  Patient to discharge at a wheelchair level Max Assist.   Patient's care partner unavailable to provide the necessary physical assistance at discharge. Plan to d/c to SNF for further rehab prior to returning home.   Reasons goals not met: Pt continues to require up to min assist for rolling and mod assist for sit<>supine.    Recommendation:  Patient will benefit from ongoing skilled PT services in skilled nursing facility setting to continue to advance safe functional mobility, address ongoing impairments in balance, strength, functional use of RUE/RLE, coordination, postural control, motor planning, and cognition, and minimize fall risk.  Equipment: No equipment provided  Reasons for discharge: discharge from hospital  Patient/family agrees with progress made and goals achieved: Yes   Skilled PT Intervention: No indication of pain though grimaces with R knee flexion.  Supine>sit with mod assist and HOB elevated.  Slide board to R with max assist +1.  Pt engaged in wii dance game focus on attention, and dynamic sitting balance with max cues to attend to task.  Pt returned to room at end of session and positioned upright in w/c with call bell in reach and needs met.   PT Discharge Precautions/Restrictions Precautions Precautions: Fall Precaution Comments: dense R hemiparesis, R inattention Restrictions Weight Bearing Restrictions: No Pain Pain Assessment Pain Assessment: No/denies pain Vision/Perception  Vision - Assessment Additional Comments: question R field cut to some extent but difficult to assess 2/2 cognitive and  language deficits Perception Perception: Impaired Inattention/Neglect: (improving) Spatial Orientation: improving Praxis Praxis: Impaired Praxis Impairment Details: Initiation;Motor planning;Ideomotor;Perseveration  Cognition Overall Cognitive Status: Impaired/Different from baseline Arousal/Alertness: Awake/alert Orientation Level: (difficult to assess 2/2 language deficits) Attention: Sustained Sustained Attention: Impaired Sustained Attention Impairment: Verbal basic;Functional basic Memory: Impaired Awareness: Impaired Awareness Impairment: Intellectual impairment Safety/Judgment: Impaired Comments: right inattention Sensation Sensation Light Touch: (unable to formally assess 2/2 cognitive deficits) Motor  Motor Motor: Motor impersistence;Motor apraxia;Hemiplegia Motor - Discharge Observations: some motor return noted in RUE/RLE  Mobility Bed Mobility Bed Mobility: Rolling Right;Rolling Left;Scooting to [facility];Supine to Sit Rolling Right: 5: Supervision;With rail Rolling Left: 4: Min assist Rolling Left Details: Manual facilitation for weight shifting Supine to Sit: 3: Mod assist;HOB elevated Scooting to HOB: 4: Min assist;With rail(HOB in [place]) Transfers Transfers: Yes Transfer via Lift Equipment: Medical sales representative Ambulation: No Gait Gait: No Stairs / Additional Locomotion Stairs: No Wheelchair Mobility Wheelchair Mobility: No  Trunk/Postural Assessment  Cervical Assessment Cervical Assessment: (forward head) Thoracic Assessment Thoracic Assessment: (rounded shoulders) Lumbar Assessment Lumbar Assessment: (preference for posterior pelvic tilt) Postural Control Postural Control: Deficits on evaluation Righting Reactions: delayed, but improving from eval Protective Responses: insufficient  Balance Balance Balance Assessed: Yes Static Sitting Balance Static Sitting - Balance Support: Left upper extremity supported;Feet  supported Static Sitting - Level of Assistance: 5: Stand by assistance;4: Min assist Static Sitting - Comment/# of Minutes: able to maintain static balance on tub bench with close supervision/intermittent min guard with verbal cues to shift weight to the L Dynamic Sitting Balance Dynamic Sitting - Balance Support: Feet supported;During functional activity Dynamic Sitting - Level of Assistance: 4: Min assist Sitting balance - Comments: for bathing at shower level  and dressing from w/c at sink Static Standing Balance Static Standing - Balance Support: Bilateral upper extremity supported Static Standing - Level of Assistance: 2: Max assist Dynamic Standing Balance Dynamic Standing - Balance Support: Right upper extremity supported;During functional activity Dynamic Standing - Level of Assistance: 2: Max assist Extremity Assessment      RLE PROM (degrees) RLE Overall PROM Comments: WFL, but painful with hip/knee flexion at end range RLE Strength RLE Overall Strength Comments: able to initiate movement against gravity, but due to weakness/motor impersistance unable to move through full range LLE PROM (degrees) LLE Overall PROM Comments: WFL LLE Strength LLE Overall Strength Comments: able to move against gravity, transition sit<>stand with LLE activation noted   See Function Navigator for Current Functional Status.  Michel Santee 07/13/2017, 11:38 AM

## 2017-07-13 NOTE — Progress Notes (Addendum)
Speech Language Pathology Discharge Summary  Patient Details  Name: Sandra [name] MRN: [id number] Date of Birth: [birth year]   Patient has met 5 of 6 long term goals.  Patient to discharge at overall Mod - Max level.   Reasons goals not met: N/A   Clinical Impression/Discharge Summary: Pt has made functional gains and has met 5 of 6 LTG's this admission due to improved communication and swallowing functioning. Currently, pt is able to consume dys. 1 textures and thin liquids via Provale cup without overt s/s of aspiration and requires overall Mod A verbal cues for use of swallowing compensatory strategies during self-feeding. Pt is able to intonate her own name with faded cues using Melodic Intonation Therapy. Given Max A multimodal cues, pt is able to name common objects, such as "ball", "baby", and "pen", with 70% accuracy. Intermittently, pt spontaneously verbalizes in response to questions/statements. Pt demonstrates sustained attention to tasks for 5-minute intervals with Mod A verbal cues for redirection. Pt's family is unable to provide the necessary assistance needed at this time, therefore, pt will discharge to a SNF. Pt would benefit from f/u SLP services to maximize cognitive and swallowing function as well as functional communication in order to improve her overall functional independence and reduce caregiver burden.      Care Partner:  Caregiver Able to Provide Assistance: Yes  Type of Caregiver Assistance: Cognitive, Physical  Recommendation:  Skilled Nursing facility;24 hour supervision/assistance  Rationale for SLP Follow Up: Maximize functional communication;Maximize cognitive function and independence;Maximize swallowing safety;Reduce caregiver burden   Equipment: Provale cup  Reasons for discharge: Discharged from hospital   Patient/Family Agrees with Progress Made and Goals Achieved: Yes     Meredeth Ide  SLP - Student 07/13/2017, 3:51 PM

## 2017-07-13 NOTE — Patient Care Conference (Signed)
Inpatient RehabilitationTeam Conference and Plan of Care Update Date: 07/13/2017   Time: 10:30 AM    Patient Name: Sandra [name]      Medical Record Number: [id number]  Date of Birth: [birth year] Sex: Female         Room/Bed: 4W19C/4W19C-01 Payor Info: Payor: MEDICARE / Plan: MEDICARE PART A AND B / Product Type: *No Product type* /    Admitting Diagnosis: L CVA  Admit Date/Time:  06/20/2017  2:42 PM Admission Comments: No comment available   Primary Diagnosis:  <principal problem not specified> Principal Problem: <principal problem not specified>  Patient Active Problem List   Diagnosis Date Noted  . Hypernatremia   . Hypokalemia   . Follow-up exam   . Aphasia   . Hemiparesthesia   . Hemiparesis of right nondominant side as late effect of cerebral infarction ([place])   . Acute ischemic right MCA stroke ([place]) 06/20/2017  . Acute ischemic stroke ([place])   . Diabetes mellitus type 2 in obese ([place])   . Benign essential HTN   . Noncompliance   . FUO (fever of unknown origin)   . Leukocytosis   . Dysphagia, post-stroke   . TIA (transient ischemic attack) 06/15/2017  . Hyperlipidemia associated with type 2 diabetes mellitus ([place]) 06/14/2017  . Type 2 diabetes mellitus with neurological complications ([place]) 38/75/6433  . Stroke ([place]) 03/31/2017  . History of adenomatous polyp of colon 02/11/2017  . Dysphagia 11/19/2016  . Essential hypertension 08/12/2016  . Hemorrhoids 06/16/2016  . Rectal bleeding 06/16/2016  . Constipation 06/16/2016    Expected Discharge Date: Expected Discharge Date: 07/14/17  Team Members Present: Physician leading conference: Dr. Delice Lesch Social Worker Present: Ovidio Kin, LCSW Nurse Present: Junius Creamer, RN PT Present: Dwyane Dee, PT OT Present: Cherylynn Ridges, OT SLP Present: Weston Anna, SLP PPS Coordinator present : Daiva Nakayama, RN, CRRN     Current Status/Progress Goal Weekly Team Focus  Medical   Functional deficits secondary  to  due to [facility] stroke with extension  Improve mobility, safety, urinary retention  See above   Bowel/Bladder   incont of b&b; lbm 2/26  cont episode with q3hr toileting  toilet q 3hr while awake; 2 assist stedy; diaper   Swallow/Nutrition/ Hydration   Dys. 1 textures with thin liqiuds with Provale restricted flow cup for 5cc bolus  min assist with least restrictive diet  Tolerance of current diet    ADL's   Max A overall  Mod/Max A   R NMR, sitting balance, attention, initiation, sit<>stand pt/family education   Mobility   mod/max for sit<>stand into stedy, stedy for transfers, max>+2 for slide boards  downgraded to max assist with focus on transfers  attention, functional mobility, sitting balance, d/c planning    Communication   Mod A for comprehension, Max-Total A for multimodal communication in regards to wants/needs   Mod A  basic auditory comprehension, MIT   Safety/Cognition/ Behavioral Observations  Max A  Mod A   attention    Pain   faces=0  <2  assess pain q shift and prn   Skin   bruising to abd  free of infection/breakdown  assess q shift and prn      *See Care Plan and progress notes for long and short-term goals.     Barriers to Discharge  Current Status/Progress Possible Resolutions Date Resolved   Physician    Decreased caregiver support;Weight;Lack of/limited family support     See above  SNF tomorrow, optimize bladder meds  Nursing                  PT                    OT                  SLP Inaccessible home environment;Decreased caregiver support;Lack of/limited family support;Medication compliance              SW                Discharge Planning/Teaching Needs:  Currently looking for a NH bed for pt since too much care for family at this time      Team Discussion:  Pt making slow and steady progress, sitting up better and took a shower today with PT & OT. MD adjusting bladder meds for retention. Following commands better and naming more.  Timed toleiting for continence. Still on Dys 1 thin liquid diet. Trials of Dys 2. Medically ready for transfer tomorrow to NH  Revisions to Treatment Plan:  NH 2/28    Continued Need for Acute Rehabilitation Level of Care: The patient requires daily medical management by a physician with specialized training in physical medicine and rehabilitation for the following conditions: Daily direction of a multidisciplinary physical rehabilitation program to ensure safe treatment while eliciting the highest outcome that is of practical value to the patient.: Yes Daily medical management of patient stability for increased activity during participation in an intensive rehabilitation regime.: Yes Daily analysis of laboratory values and/or radiology reports with any subsequent need for medication adjustment of medical intervention for : Neurological problems;Other  Marcello Tuzzolino, Gardiner Rhyme 07/13/2017, 11:29 AM

## 2017-07-13 NOTE — Progress Notes (Signed)
21t Details  Name: Sandra [name] MRN: [id number] Date of Birth: 1940-07-09 Today's Date: 07/13/2017  [id number], 30 min individual tx Pain: none per pt  Pt already up in tilt in space w/c.  Level transfer to R with max assist, to L with mod assist, with pt pulling with L hand on armrest of w/c.  neuromuscular re-education via positioning, forced use in sitting on firm mat for trunk righting during L and R hand activities.  With multimodal cues,  reaching out of BOS to L with L hand, and within BOS with R hand with assistance to grasp playing cards.  Pt left resting in w/c with quick release belt applied and all needs within reach.  See function navigator for current status.   Rhet Rorke 07/13/2017, 7:57 AM 7 [id number]

## 2017-07-13 NOTE — Progress Notes (Signed)
Social Work   Sheron Robin, Eliezer Champagne  Social Worker  Physical Medicine and Rehabilitation  Patient Care Conference  Signed  Date of Service:  07/13/2017 11:29 AM          Signed          [] Hide copied text  [] Hover for details   Inpatient RehabilitationTeam Conference and Plan of Care Update Date: 07/13/2017   Time: 10:30 AM      Patient Name: Kerria Sapien      Medical Record Number: [id number]  Date of Birth: 04-12-1941 Sex: Female         Room/Bed: 4W19C/4W19C-01 Payor Info: Payor: MEDICARE / Plan: MEDICARE PART A AND B / Product Type: *No Product type* /     Admitting Diagnosis: L CVA  Admit Date/Time:  06/20/2017  2:42 PM Admission Comments: No comment available    Primary Diagnosis:  <principal problem not specified> Principal Problem: <principal problem not specified>       Patient Active Problem List    Diagnosis Date Noted  . Hypernatremia    . Hypokalemia    . Follow-up exam    . Aphasia    . Hemiparesthesia    . Hemiparesis of right nondominant side as late effect of cerebral infarction ([place])    . Acute ischemic right MCA stroke ([place]) 06/20/2017  . Acute ischemic stroke ([place])    . Diabetes mellitus type 2 in obese ([place])    . Benign essential HTN    . Noncompliance    . FUO (fever of unknown origin)    . Leukocytosis    . Dysphagia, post-stroke    . TIA (transient ischemic attack) 06/15/2017  . Hyperlipidemia associated with type 2 diabetes mellitus ([place]) 06/14/2017  . Type 2 diabetes mellitus with neurological complications ([place]) 21/19/4174  . Stroke ([place]) 03/31/2017  . History of adenomatous polyp of colon 02/11/2017  . Dysphagia 11/19/2016  . Essential hypertension 08/12/2016  . Hemorrhoids 06/16/2016  . Rectal bleeding 06/16/2016  . Constipation 06/16/2016      Expected Discharge Date: Expected Discharge Date: 07/14/17   Team Members Present: Physician leading conference: Dr. Delice Lesch Social Worker Present: Ovidio Kin,  LCSW Nurse Present: Junius Creamer, RN PT Present: Dwyane Dee, PT OT Present: Cherylynn Ridges, OT SLP Present: Weston Anna, SLP PPS Coordinator present : Daiva Nakayama, RN, CRRN       Current Status/Progress Goal Weekly Team Focus  Medical     Functional deficits secondary to  due to [facility] stroke with extension  Improve mobility, safety, urinary retention  See above   Bowel/Bladder     incont of b&b; lbm 2/26  cont episode with q3hr toileting  toilet q 3hr while awake; 2 assist stedy; diaper   Swallow/Nutrition/ Hydration     Dys. 1 textures with thin liqiuds with Provale restricted flow cup for 5cc bolus  min assist with least restrictive diet  Tolerance of current diet    ADL's     Max A overall  Mod/Max A   R NMR, sitting balance, attention, initiation, sit<>stand pt/family education   Mobility     mod/max for sit<>stand into stedy, stedy for transfers, max>+2 for slide boards  downgraded to max assist with focus on transfers  attention, functional mobility, sitting balance, d/c planning    Communication     Mod A for comprehension, Max-Total A for multimodal communication in regards to wants/needs   Mod A  basic auditory comprehension, MIT   Safety/Cognition/ Behavioral Observations  Max A  Mod A   attention    Pain     faces=0  <2  assess pain q shift and prn   Skin     bruising to abd  free of infection/breakdown  assess q shift and prn     *See Care Plan and progress notes for long and short-term goals.      Barriers to Discharge   Current Status/Progress Possible Resolutions Date Resolved   Physician     Decreased caregiver support;Weight;Lack of/limited family support     See above  SNF tomorrow, optimize bladder meds      Nursing                 PT                    OT                 SLP Inaccessible home environment;Decreased caregiver support;Lack of/limited family support;Medication compliance            SW              Discharge  Planning/Teaching Needs:  Currently looking for a NH bed for pt since too much care for family at this time      Team Discussion:  Pt making slow and steady progress, sitting up better and took a shower today with PT & OT. MD adjusting bladder meds for retention. Following commands better and naming more. Timed toleiting for continence. Still on Dys 1 thin liquid diet. Trials of Dys 2. Medically ready for transfer tomorrow to NH  Revisions to Treatment Plan:  NH 2/28    Continued Need for Acute Rehabilitation Level of Care: The patient requires daily medical management by a physician with specialized training in physical medicine and rehabilitation for the following conditions: Daily direction of a multidisciplinary physical rehabilitation program to ensure safe treatment while eliciting the highest outcome that is of practical value to the patient.: Yes Daily medical management of patient stability for increased activity during participation in an intensive rehabilitation regime.: Yes Daily analysis of laboratory values and/or radiology reports with any subsequent need for medication adjustment of medical intervention for : Neurological problems;Other   DupreeGardiner Rhyme 07/13/2017, 11:29 AM                  Abeer Iversen, Gardiner Rhyme, LCSW  Social Worker  Physical Medicine and Rehabilitation  Patient Care Conference  Signed  Date of Service:  07/06/2017 12:33 PM          Signed          [] Hide copied text  [] Hover for details   Inpatient RehabilitationTeam Conference and Plan of Care Update Date: 07/06/2017   Time: 11:15 AM      Patient Name: Tamaira Ciriello      Medical Record Number: [id number]  Date of Birth: 29-Jun-1940 Sex: Female         Room/Bed: 4W19C/4W19C-01 Payor Info: Payor: MEDICARE / Plan: MEDICARE PART A AND B / Product Type: *No Product type* /     Admitting Diagnosis: L CVA  Admit Date/Time:  06/20/2017  2:42 PM Admission Comments: No comment available     Primary Diagnosis:  <principal problem not specified> Principal Problem: <principal problem not specified>       Patient Active Problem List    Diagnosis Date Noted  . Hypernatremia    . Hypokalemia    . Follow-up exam    .  Aphasia    . Hemiparesthesia    . Hemiparesis of right nondominant side as late effect of cerebral infarction ([place])    . 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 Acute ischemic stroke ([place])    . Diabetes mellitus type 2 in obese ([place])    . Benign essential HTN    . Noncompliance    . FUO (fever of unknown origin)    . Leukocytosis    . Dysphagia, post-stroke    . TIA (transient ischemic attack) 06/15/2017  . Hyperlipidemia associated with type 2 diabetes mellitus ([place]) 06/14/2017  . Type 2 diabetes mellitus with neurological complications ([place]) 35/57/3220  . Stroke ([place]) 03/31/2017  . History of adenomatous polyp of colon 02/11/2017  . Dysphagia 11/19/2016  . Essential hypertension 08/12/2016  . Hemorrhoids 06/16/2016  . Rectal bleeding 06/16/2016  . Constipation 06/16/2016      Expected Discharge Date: Expected Discharge Date: 07/11/17   Team Members Present: Physician leading conference: Dr. Alysia Penna Social Worker Present: Ovidio Kin, LCSW Nurse Present: Junius Creamer, RN PT Present: Dwyane Dee, PT OT Present: Cherylynn Ridges, OT SLP Present: Weston Anna, SLP PPS Coordinator present : Daiva Nakayama, RN, CRRN       Current Status/Progress Goal Weekly Team Focus  Medical     Aphasic, urinary retention requiring intermittent catheterizations, blood pressure stabilizing  Maintain medical stability, assure adequate fluid intake off IV fluids  Improve bladder function   Bowel/Bladder     Incontinent of B&B  Keep clean and dry.  Assess for toileting needs q shift and prn   Swallow/Nutrition/ Hydration     Dys. 1 textures with thin liquids with Provale restricted flow cup for 5cc bolus  min assist with least restrictive  diet  Tolerance of curent diet, possible trials of Dys. 2 textures    ADL's     Max +2  Min/mod A overall  R NMR, sitting balance, transfers, attention, initiation, pt/family education   Mobility     max>+2 for all mobility  lofty mod/max assist  attention, functional moblity progression, sitting balance   Communication     Mod A for basic comprehension, Max-Total A for multimodal communication in regards to wants/needs  Mod A  basic auditory comprehension, MIT    Safety/Cognition/ Behavioral Observations   Max-Total A  min assist   attention    Pain     No c/o pain  <2.  Assess pain q 4hr and prn   Skin     CDI  Maintain skin integrity.  Assess q shift and prn     *See Care Plan and progress notes for long and short-term goals.      Barriers to Discharge   Current Status/Progress Possible Resolutions Date Resolved   Physician     Decreased caregiver support;Weight  Global aphasia obesity poor mobility urinary retention  Slow progress towards goals  Will need SNF placement      Nursing                 PT                    OT                 SLP Inaccessible home environment;Decreased caregiver support;Lack of/limited family support;Medication compliance            SW              Discharge Planning/Teaching Needs:  Pt  will be too much care for daughter in-law at this time will need to pursue NHP until family can manage at home      Team Discussion:  More alert and engaging, trying to answer yes/no does nod her head. Requiring I & O caths. Still max to total assist and will require too much assist for family to provide, so will need to pursue NHP. R-hemi severe. Likes the radio and does well in therapies with it on.  Revisions to Treatment Plan:  NHP    Continued Need for Acute Rehabilitation Level of Care: The patient requires daily medical management by a physician with specialized training in physical medicine and rehabilitation for the following conditions: Daily  direction of a multidisciplinary physical rehabilitation program to ensure safe treatment while eliciting the highest outcome that is of practical value to the patient.: Yes Daily medical management of patient stability for increased activity during participation in an intensive rehabilitation regime.: Yes Daily analysis of laboratory values and/or radiology reports with any subsequent need for medication adjustment of medical intervention for : Neurological problems;Other   Javani Spratt, Gardiner Rhyme 07/07/2017, 2:11 PM                  Jalisa Sacco, Gardiner Rhyme, LCSW  Social Worker  Physical Medicine and Rehabilitation  Patient Care Conference  Signed  Date of Service:  06/29/2017  1:06 PM          Signed          [] Hide copied text  [] Hover for details   Inpatient RehabilitationTeam Conference and Plan of Care Update Date: 06/29/2017   Time: 11:15 AM      Patient Name: Onyinyechi Huante      Medical Record Number: [id number]  Date of Birth: [birth year] Sex: Female         Room/Bed: 4W19C/4W19C-01 Payor Info: Payor: MEDICARE / Plan: MEDICARE PART A AND B / Product Type: *No Product type* /     Admitting Diagnosis: L CVA  Admit Date/Time:  06/20/2017  2:42 PM Admission Comments: No comment available    Primary Diagnosis:  <principal problem not specified> Principal Problem: <principal problem not specified>       Patient Active Problem List    Diagnosis Date Noted  . Hypernatremia    . Hypokalemia    . Follow-up exam    . Aphasia    . Hemiparesthesia    . 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 Acute ischemic stroke ([place])    . Diabetes mellitus type 2 in obese ([place])    . Benign essential HTN    . Noncompliance    . FUO (fever of unknown origin)    . Leukocytosis    . Dysphagia, post-stroke    . TIA (transient ischemic attack) 06/15/2017  . Hyperlipidemia associated with  type 2 diabetes mellitus ([place]) 06/14/2017  . Type 2 diabetes mellitus with neurological complications ([place]) 99/83/3825  . Stroke ([place]) 03/31/2017  . History of adenomatous polyp of colon 02/11/2017  . Dysphagia 11/19/2016  . Essential hypertension 08/12/2016  . Hemorrhoids 06/16/2016  . Rectal bleeding 06/16/2016  . Constipation 06/16/2016      Expected Discharge Date: Expected Discharge Date: 07/11/17   Team Members Present: Physician leading conference: Dr. Alysia Penna Social Worker Present: Ovidio Kin, LCSW Nurse Present: Blair Heys, RN PT Present: Dwyane Dee, PT OT Present: Cherylynn Ridges, OT SLP  Present: Weston Anna, SLP PPS Coordinator present : Daiva Nakayama, RN, CRRN       Current Status/Progress Goal Weekly Team Focus  Medical     A phasic, positive UA, blood pressures are fluctuating  Maintain medical stability, fluid electrolyte management  Check urine culture, correct electrolyte abnormalities   Bowel/Bladder     Incontinent of B&B.  Keep clean and dry.  Assess for toileting needs q shift and PRN.   Swallow/Nutrition/ Hydration     dysphagia 1 and thin liquids via Provale restricted flow cup for 5 cc bolus  min assist with least restrictive diet  completion of Modified Barium Swallow Study -no appreciable progress   ADL's     Max/total +2  Min/mod A overall  R NMR, sitting balance, transfer training, attention, initiation, following commands   Mobility     +2 for all mobility, minimal initiation, decreased attention  lofty mod/max assist  attention, visual scanning, functional mobility progressing    Communication     nonverbal, Max A to Total A to follow 1 step commands, yes/no questions  Mod A  selection of requested object from field of 2, vocalization   Safety/Cognition/ Behavioral Observations   Max A to Total A     sustained attention for ~1 minute, task initiation   Pain     No s/s pain.  <2.  Assess q shift and PRN and treat as needed.    Skin     No complications.  Maintain skin integrity.  Assess q shift and PRN.     *See Care Plan and progress notes for long and short-term goals.      Barriers to Discharge   Current Status/Progress Possible Resolutions Date Resolved   Physician     Inaccessible home environment;Medical stability;Weight  Globally aphasia, obesity, heavy assist level  Continue rehab program  Manage medical complications      Nursing                 PT                    OT                 SLP Inaccessible home environment;Decreased caregiver support;Lack of/limited family support;Medication compliance            SW              Discharge Planning/Teaching Needs:  Family wants to take home as long as daughter in-law can provide the care she requires. She is here daily and encourages her in therapies.      Team Discussion:  Goals mod level, currently plus 2 total assist. MD checking UA. Difficulty following commands-attempting to verbalize. More alert in therapies. MBS-Dys 1 thin with 5 cc cup for small sips. Unsure if family can provide amount of care pt will require upon discharge from rehab. May need to pursue NHP  Revisions to Treatment Plan:  DC 2/25 versus NHP    Continued Need for Acute Rehabilitation Level of Care: The patient requires daily medical management by a physician with specialized training in physical medicine and rehabilitation for the following conditions: Daily direction of a multidisciplinary physical rehabilitation program to ensure safe treatment while eliciting the highest outcome that is of practical value to the patient.: Yes Daily medical management of patient stability for increased activity during participation in an intensive rehabilitation regime.: Yes Daily analysis of laboratory values and/or radiology reports with any subsequent need for  medication adjustment of medical intervention for : Neurological problems;Other   Nygeria Lager, Gardiner Rhyme 06/29/2017, 1:06 PM                    Avraham Benish, Gardiner Rhyme, LCSW  Social Worker  Physical Medicine and Rehabilitation  Patient Care Conference  Signed  Date of Service:  06/22/2017  1:06 PM          Signed          [] Hide copied text  [] Hover for details   Inpatient RehabilitationTeam Conference and Plan of Care Update Date: 06/22/2017   Time: 11:00 Am      Patient Name: Lizabeth Fellner      Medical Record Number: [id number]  Date of Birth: 1941-01-01 Sex: Female         Room/Bed: 4W19C/4W19C-01 Payor Info: Payor: MEDICARE / Plan: MEDICARE PART A AND B / Product Type: *No Product type* /     Admitting Diagnosis: L CVA  Admit Date/Time:  06/20/2017  2:42 PM Admission Comments: No comment available    Primary Diagnosis:  <principal problem not specified> Principal Problem: <principal problem not specified>       Patient Active Problem List    Diagnosis Date Noted  . 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 Acute ischemic stroke ([place])    . Diabetes mellitus type 2 in obese ([place])    . Benign essential HTN    . Noncompliance    . FUO (fever of unknown origin)    . Leukocytosis    . Dysphagia, post-stroke    . TIA (transient ischemic attack) 06/15/2017  . Hyperlipidemia associated with type 2 diabetes mellitus ([place]) 06/14/2017  . Type 2 diabetes mellitus with neurological complications ([place]) 11/07/7626  . Stroke ([place]) 03/31/2017  . History of adenomatous polyp of colon 02/11/2017  . Dysphagia 11/19/2016  . Essential hypertension 08/12/2016  . Hemorrhoids 06/16/2016  . Rectal bleeding 06/16/2016  . Constipation 06/16/2016      Expected Discharge Date:     Team Members Present: Physician leading conference: Dr. Alysia Penna Social Worker Present: Ovidio Kin, LCSW Nurse Present: Brita Romp, RN PT Present: Dwyane Dee, PT OT Present: Cherylynn Ridges, OT SLP Present: Weston Anna, SLP PPS Coordinator present : Daiva Nakayama, RN, CRRN       Current  Status/Progress Goal Weekly Team Focus  Medical     Fever of unknown origin, global aphasic, D1 nectar with poor appetite  reduce aspiration risk, ensure adequate nutrition  work up of FUO   Bowel/Bladder     Incontinent of B&B.  Keep clean and dry.  Assess toileting needs q shift and PRN.   Swallow/Nutrition/ Hydration     dys 1, nectar thick liquids; full staff supervision   min assist   trials of thin liquids, strategies to contain boluses and clear oral residue    ADL's     Total A +2   min/Mod A overall  R NMR, sitting balance/tolerance, transfer training, attention, initiation    Mobility     +2 total for all mobility, mechanical lifts for transfers  lofty mod/max assist  arousal, attention, visual scanning, functional mobility as able   Communication     nonverbal, max to total assist to follow commands, answer yes/no questions  mod assist   auditory comprehension (ie yes/no questions, following commands), vocalization   Safety/Cognition/ Behavioral Observations   max to total assist   min assist   scanning to the right of midline,  sustained attention to basic, familiar tasks    Pain     No s/s of pain.  <2.  Assess q shift and PRN for pain.   Skin     No complications.  Maintain skin integrity.  Assess skin q shift and PRN.     *See Care Plan and progress notes for long and short-term goals.      Barriers to Discharge   Current Status/Progress Possible Resolutions Date Resolved   Physician     Inaccessible home environment;Medical stability;Incontinence;Wound Care  +2 total A with severe global aphasia  initiating rehab program  ID consult for FUO      Nursing                 PT  Inaccessible home environment;Decreased caregiver support;Incontinence;Insurance for SNF coverage                 OT Inaccessible home environment;Decreased caregiver support;Home environment access/layout;Weight  (4 stairs to enter house with no railing)             SLP            SW  Decreased caregiver support [name] is of small stature and pt is a large woman             Discharge Planning/Teaching Needs:  Family wants to take home but daughter in-law is small and can not provide much physical assist. Son is a PT at [employer]. Await progress here.      Team Discussion:  Goals set for min/mod level of assist. Currently plus 2 total assist. Consulted ID to evaluate regarding higher white count. Checking UA again. Poor iniatiation. Incontinent B & B. Dye 1 nectar trials of thin. Poor po intake. Maybe set discharge target date next week. Will be much care for family.  Revisions to Treatment Plan:  New eval set target discharge date next week    Continued Need for Acute Rehabilitation Level of Care: The patient requires daily medical management by a physician with specialized training in physical medicine and rehabilitation for the following conditions: Daily direction of a multidisciplinary physical rehabilitation program to ensure safe treatment while eliciting the highest outcome that is of practical value to the patient.: Yes Daily medical management of patient stability for increased activity during participation in an intensive rehabilitation regime.: Yes Daily analysis of laboratory values and/or radiology reports with any subsequent need for medication adjustment of medical intervention for : Other;Neurological problems   Elease Hashimoto 06/22/2017, 1:06 PM                 Patient ID: [name], female   DOB: 05-06-1941, 77 y.o.   MRN: [id number]

## 2017-07-13 NOTE — Progress Notes (Signed)
Occupational Therapy Session Note  Patient Details  Name: Sandra [name] MRN: [id number] Date of Birth: Aug 29, 1940  Today's Date: 07/13/2017 OT Co-Treatment Time: 5284-1324 OT Co-Treatment Time Calculation (min): 45 min  Short Term Goals: Week 3:  OT Short Term Goal 1 (Week 3): STG=LTG 2/2 ELOS  Skilled Therapeutic Interventions/Progress Updates:    Skilled Co-treat with PT this session. Pt transferred to sitting EOB and Stedy used to transfer pt into shower with mod A +2. Incorporated weight bearing through R UE within bathing tasks with hand over hand A for neuro re-ed. Pt initiated bathing multiple body parts on R and L side without cues today. Pt with improved sitting balance and needed 50% min A to supervision with verbal cues for body awareness to lean to the L. Stedy transfer out of shower to wc. Utilized mirror feedback bring pt to midline sitting in wc. Pt with increased initiation of R UE today, with increased shoulder activation, still limited by distal weakness and motor imporsistence.  Pt able to thread LLE into pants today as well as don L sock. Sit<>stand without stedy with mod/max A+2. Practiced multiple sit<>stands at the wall in dayroom with focus on full hip/trunk extension. Pt returned to room and left tilted in wc with safety belt on and needs met.    Therapy Documentation Precautions:  Precautions Precautions: Fall Precaution Comments: dense R hemiparesis, R inattention Restrictions Weight Bearing Restrictions: No Other Position/Activity Restrictions: Positioning of right upper extremity in sight of patient and not pulling on it as it is flaccid.  Pain: Pain Assessment Pain Assessment: No/denies pain  See Function Navigator for Current Functional Status.   Therapy/Group: Co-Treatment  Daneen Schick Sandra [name] 07/13/2017, 10:34 AM

## 2017-07-13 NOTE — Progress Notes (Signed)
Speech Language Pathology Daily Session Note  Patient Details  Name: Sandra [name] MRN: [id number] Date of Birth: 1940-07-12  Today's Date: 07/13/2017 SLP Individual Time: 4503-8882 SLP Individual Time Calculation (min): 45 min  Short Term Goals: Week 4: SLP Short Term Goal 1 (Week 4): Pt will produce functional words using intoned speech and with acceptable intelligibility in response to a question in 7 out of 10 trials.  SLP Short Term Goal 2 (Week 4): Pt will imitate the names of functional objects with 70% accuracy with Max A multimodal cues.  SLP Short Term Goal 3 (Week 4): Pt will sustain her attention to basic, familiar tasks for 10 minute intervals with Mod A verbal cues for redirection.   SLP Short Term Goal 4 (Week 4): Given Mod A multimodal cues, pt will select requested object from field of 2 in 75% of opportunities.  SLP Short Term Goal 5 (Week 4): Pt will consume trials of dys. 2 textures with efficient mastication and complete oral clearance with Min A verbal cues over two sessions prior to upgrade.  SLP Short Term Goal 6 (Week 4): Pt will consume dys 1 textures and thin liquids with Min A verbal cues for use of swallowing precautions and minimal overt s/s of aspiration.    Skilled Therapeutic Interventions: Skilled treatment session focused on dysphagia and cognitive goals. SLP facilitated session by providing Min A verbal cues to follow 1-step commands during bed mobility while changing brief. SLP further facilitated session by providing skilled observation of pt consuming breakfast. Pt consumed dys. 1 textures with thin liquids without overt s/s of aspiration and required Mod A verbal cues for use of swallowing compensatory strategies and problem solving for self-feeding. Pt left upright in bed with NT present. Continue with current plan of care.      Function:  Eating Eating   Modified Consistency Diet: Yes Eating Assist Level: Hand over hand assist;Help managing  cup/glass;Set up assist for;Supervision or verbal cues;Helper scoops food on utensil   Eating Set Up Assist For: Opening containers Helper Elliott on Utensil: Occasionally     Cognition Comprehension Comprehension assist level: Understands basic less than 25% of the time/ requires cueing >75% of the time  Expression   Expression assist level: Expresses basis less than 25% of the time/requires cueing >75% of the time.  Social Interaction Social Interaction assist level: Interacts appropriately less than 25% of the time. May be withdrawn or combative.  Problem Solving Problem solving assist level: Solves basic less than 25% of the time - needs direction nearly all the time or does not effectively solve problems and may need a restraint for safety  Memory Memory assist level: Recognizes or recalls less than 25% of the time/requires cueing greater than 75% of the time    Pain Pain Assessment Pain Assessment: No/denies pain  Therapy/Group: Individual Therapy  Meredeth Ide  SLP - Student 07/13/2017, 11:08 AM

## 2017-07-13 NOTE — Progress Notes (Signed)
Social Work Patient ID: Sandra [name], female   DOB: 1940/07/30, 77 y.o.   MRN: [id number]  Spoke with son to discuss bed offer via [facility] and he had spoken with [facility] and they can not take his Mom. Will proceed with [facility], gave him admission coordinator to contact to set up time for paperwork. Will plan to transfer tomorrow. Pt having a good day in therapies today, needs more time to recover and therapy for her stroke. Gather paper work for tomorrow.

## 2017-07-14 LAB — GLUCOSE, CAPILLARY
GLUCOSE-CAPILLARY: 128 mg/dL — AB (ref 65–99)
Glucose-Capillary: 183 mg/dL — ABNORMAL HIGH (ref 65–99)
Glucose-Capillary: 61 mg/dL — ABNORMAL LOW (ref 65–99)
Glucose-Capillary: 98 mg/dL (ref 65–99)

## 2017-07-14 LAB — CBC
HCT: 40.3 % (ref 36.0–46.0)
HEMOGLOBIN: 13 g/dL (ref 12.0–15.0)
MCH: 28.2 pg (ref 26.0–34.0)
MCHC: 32.3 g/dL (ref 30.0–36.0)
MCV: 87.4 fL (ref 78.0–100.0)
PLATELETS: 264 10*3/uL (ref 150–400)
RBC: 4.61 MIL/uL (ref 3.87–5.11)
RDW: 14.8 % (ref 11.5–15.5)
WBC: 6.4 10*3/uL (ref 4.0–10.5)

## 2017-07-14 MED ORDER — INSULIN GLARGINE 100 UNIT/ML ~~LOC~~ SOLN: 35.0000 [IU] | Freq: Every day | SUBCUTANEOUS | 11 refills | Status: DC

## 2017-07-14 MED ORDER — AMLODIPINE BESYLATE 10 MG PO TABS: 10.0000 mg | ORAL_TABLET | Freq: Every day | ORAL | Status: DC

## 2017-07-14 MED ORDER — BETHANECHOL CHLORIDE 25 MG PO TABS: 25.0000 mg | ORAL_TABLET | Freq: Three times a day (TID) | ORAL | Status: DC

## 2017-07-14 MED ORDER — TAMSULOSIN HCL 0.4 MG PO CAPS: 0.4000 mg | ORAL_CAPSULE | Freq: Every day | ORAL | Status: AC

## 2017-07-14 MED ORDER — PREMIER PROTEIN SHAKE: 2.0000 [oz_av] | Freq: Four times a day (QID) | ORAL | 0 refills | Status: DC

## 2017-07-14 MED ORDER — ACETAMINOPHEN 325 MG PO TABS: 325.0000 mg | ORAL_TABLET | ORAL | Status: AC | PRN

## 2017-07-14 MED ORDER — ASPIRIN 325 MG PO TBEC: 325.0000 mg | DELAYED_RELEASE_TABLET | Freq: Every day | ORAL | 0 refills | Status: AC

## 2017-07-14 MED ORDER — SENNOSIDES-DOCUSATE SODIUM 8.6-50 MG PO TABS: 2.0000 | ORAL_TABLET | Freq: Every day | ORAL | Status: AC

## 2017-07-14 NOTE — Progress Notes (Signed)
F/S 61 at 0630. One cup of yougurt given and F/S came up to 98. PA on unit notified. NNO's noted.

## 2017-07-14 NOTE — Progress Notes (Signed)
Occupational Therapy Discharge Summary  Patient Details  Name: Sandra [name] MRN: [id number] Date of Birth: Sep 02, 1940  Patient has met 8 of 8 long term goals due to improved activity tolerance, improved balance, postural control, ability to compensate for deficits, functional use of  RIGHT upper and RIGHT lower extremity, improved attention, improved awareness and improved coordination.  Patient to discharge at overall MAX/ Mod Assist level.  Patient's care partner unavailable to provide the necessary physical and cognitive assistance at discharge, therefore, recommend further occupational therapy at next venue of care.   Reasons goals not met: n/a  Recommendation:  Patient will benefit from ongoing skilled OT services in skilled nursing facility setting to continue to advance functional skills in the area of Reduce care partner burden.  Equipment: No equipment provided; equipment to be provided at next venue of care  Reasons for discharge: treatment goals met and discharge from hospital  Patient/family agrees with progress made and goals achieved: Yes  OT Discharge Precautions/Restrictions  Precautions Precautions: Fall Restrictions Weight Bearing Restrictions: No ADL ADL ADL Comments: Please see functional navigator Perception  Perception: Impaired Inattention/Neglect: (Improved since eval) Spatial Orientation: improving Praxis Praxis: Impaired(Improved since eval) Cognition Overall Cognitive Status: Impaired/Different from baseline Arousal/Alertness: Awake/alert Orientation Level: (exp/recpeptive aphasia ) Sensation Sensation Proprioception: Impaired by gross assessment(improved since eval) Coordination Gross Motor Movements are Fluid and Coordinated: No Fine Motor Movements are Fluid and Coordinated: No Motor  Motor Motor: Motor impersistence;Motor apraxia;Hemiplegia Motor - Discharge Observations: some motor return noted in RUE/RLE Mobility  Bed Mobility Bed  Mobility: Rolling Right;Rolling Left;Scooting to [place];Supine to Sit Rolling Right: 5: Supervision;With rail Rolling Left: 4: Min assist  Trunk/Postural Assessment  Postural Control Postural Control: Deficits on evaluation Righting Reactions: delayed, but improving from eval Protective Responses: insufficient  Balance Balance Balance Assessed: Yes Static Sitting Balance Static Sitting - Balance Support: Left upper extremity supported;Feet supported Static Sitting - Level of Assistance: 5: Stand by assistance;4: Min assist Dynamic Sitting Balance Dynamic Sitting - Balance Support: Feet supported;During functional activity Dynamic Sitting - Level of Assistance: 4: Min assist Sitting balance - Comments: for bathing at shower level and dressing from w/c at sink Static Standing Balance Static Standing - Balance Support: Bilateral upper extremity supported Static Standing - Level of Assistance: 2: Max assist Dynamic Standing Balance Dynamic Standing - Balance Support: Right upper extremity supported;During functional activity Dynamic Standing - Level of Assistance: 2: Max assist Extremity/Trunk Assessment RUE Assessment RUE Assessment: Exceptions to WFL(shoulder,scap, elbow activation- brunstrom level III) LUE Assessment LUE Assessment: Within Functional Limits   See Function Navigator for Current Functional Status.  Daneen Schick Duvan Mousel 07/14/2017, 12:35 PM

## 2017-07-14 NOTE — Progress Notes (Signed)
Patient discharged to [facility] via ambulance. Report given to [facility] LPN.

## 2017-07-14 NOTE — Progress Notes (Signed)
Social Work  Discharge Note  The overall goal for the admission was met for:   Discharge location: No-[facility] [place]-SNF  Length of Stay: Yes-24 DAYS  Discharge activity level: Yes-MOD/MAX LEVEL OF ASSIST  Home/community participation: Yes  Services provided included: MD, RD, PT, OT, SLP, RN, CM, Pharmacy and SW  Financial Services: Medicare and Private Insurance: TR[employer]  Follow-up services arranged: Other: NHP  Comments (or additional information):PT REQUIRES TOO MUCH PHYSICAL CARE AT THIS TIME FOR FAMILY TO PROVIDE, NEEDS MORE REHAB AND LONGER TIME TO RECOVER.  Patient/Family verbalized understanding of follow-up arrangements: Yes  Individual responsible for coordination of the follow-up plan: JAMES-SON AND [name]-DAUGHTER IN[facility]  Confirmed correct DME delivered: Elease Hashimoto 07/14/2017    Elease Hashimoto

## 2017-07-14 NOTE — Progress Notes (Signed)
Subjective/Complaints: Remains aphasic  ROS: Limited due to language/communication   Objective: Vital Signs: Blood pressure 140/70, pulse 75, temperature 98 F (36.7 C), temperature source Oral, resp. rate 18, height 5\' 5"  (1.651 m), weight 83.3 kg (183 lb 10.3 oz), SpO2 99 %.  Results for orders placed or performed during the hospital encounter of 06/20/17 (from the past 72 hour(s))  Glucose, capillary     Status: Abnormal   Collection Time: 07/11/17 12:00 PM  Result Value Ref Range   Glucose-Capillary 200 (H) 65 - 99 mg/dL  Glucose, capillary     Status: None   Collection Time: 07/11/17  5:11 PM  Result Value Ref Range   Glucose-Capillary 71 65 - 99 mg/dL  Glucose, capillary     Status: Abnormal   Collection Time: 07/11/17  9:23 PM  Result Value Ref Range   Glucose-Capillary 156 (H) 65 - 99 mg/dL  Glucose, capillary     Status: Abnormal   Collection Time: 07/12/17 11:30 AM  Result Value Ref Range   Glucose-Capillary 143 (H) 65 - 99 mg/dL  Glucose, capillary     Status: Abnormal   Collection Time: 07/12/17  5:24 PM  Result Value Ref Range   Glucose-Capillary 120 (H) 65 - 99 mg/dL  Glucose, capillary     Status: Abnormal   Collection Time: 07/12/17  9:32 PM  Result Value Ref Range   Glucose-Capillary 160 (H) 65 - 99 mg/dL  Glucose, capillary     Status: None   Collection Time: 07/13/17  6:29 AM  Result Value Ref Range   Glucose-Capillary 87 65 - 99 mg/dL  Glucose, capillary     Status: Abnormal   Collection Time: 07/13/17 11:20 AM  Result Value Ref Range   Glucose-Capillary 177 (H) 65 - 99 mg/dL  Glucose, capillary     Status: Abnormal   Collection Time: 07/13/17  4:51 PM  Result Value Ref Range   Glucose-Capillary 138 (H) 65 - 99 mg/dL  Glucose, capillary     Status: Abnormal   Collection Time: 07/14/17  6:31 AM  Result Value Ref Range   Glucose-Capillary 61 (L) 65 - 99 mg/dL  Glucose, capillary     Status: None   Collection Time: 07/14/17  7:01 AM  Result  Value Ref Range   Glucose-Capillary 98 65 - 99 mg/dL  CBC     Status: None   Collection Time: 07/14/17  7:07 AM  Result Value Ref Range   WBC 6.4 4.0 - 10.5 K/uL   RBC 4.61 3.87 - 5.11 MIL/uL   Hemoglobin 13.0 12.0 - 15.0 g/dL   HCT 40.3 36.0 - 46.0 %   MCV 87.4 78.0 - 100.0 fL   MCH 28.2 26.0 - 34.0 pg   MCHC 32.3 30.0 - 36.0 g/dL   RDW 14.8 11.5 - 15.5 %   Platelets 264 150 - 400 K/uL    Comment: Performed at [facility], [address] [place], [place] [address]     HEENT: Normocephalic. Atraumatic. Cardio: RRR. No JVD .Marland Kitchen Resp: CTA Bilaterally. Normal effort   GI: BS positive and ND Musculoskeletal:  No tenderness and No Edema Skin:   Warm and dry. Intact. Neuro: Right upper extremity 2-/5 biceps triceps trace grip, 2-/5 hip knee extensor synergy 0/5 foot and ankle Awake Difficultly following commands, but improving Expressive>Receptive Aphasia Gen NAD. Vital signs reviewed.  Psych: Limited due to a aphasia   Assessment/Plan: 1. Functional deficits secondary to Left MCA infarct with R HP, Aphasia  Stable for D/C today skilled nursing F/u PCP at SNF F/u PM&R 2 weeks See D/C summary See D/C instructions FIM: Function - Bathing Position: Shower Body parts bathed by patient: Right arm, Chest, Abdomen, Right upper leg, Left upper leg, Left lower leg Body parts bathed by helper: Right lower leg, Back, Buttocks, Front perineal area, Left arm Assist Level: Touching or steadying assistance(Pt > 75%)  Function- Upper Body Dressing/Undressing What is the patient wearing?: Pull over shirt/dress Bra - Perfomed by helper: Thread/unthread right bra strap, Thread/unthread left bra strap, Hook/unhook bra (pull down sports bra) Pull over shirt/dress - Perfomed by patient: Thread/unthread left sleeve, Put head through opening Pull over shirt/dress - Perfomed by helper: Pull shirt over trunk, Thread/unthread right sleeve Assist Level: Touching or steadying assistance(Pt >  75%) Function - Lower Body Dressing/Undressing What is the patient wearing?: Pants Position: Wheelchair/chair at sink Pants- Performed by patient: Thread/unthread left pants leg Pants- Performed by helper: Thread/unthread right pants leg, Pull pants up/down Non-skid slipper socks- Performed by patient: Don/doff left sock Non-skid slipper socks- Performed by helper: Don/doff right sock Socks - Performed by helper: Don/doff right sock, Don/doff left sock Shoes - Performed by helper: Don/doff right shoe, Don/doff left shoe(slip on) Assist for footwear: Partial/moderate assist Assist for lower body dressing: Touching or steadying assistance (Pt > 75%)  Function - Toileting Toileting activity did not occur: No continent bowel/bladder event Toileting steps completed by helper: Adjust clothing prior to toileting, Performs perineal hygiene, Adjust clothing after toileting Toileting Assistive Devices: Grab bar or rail Assist level: Touching or steadying assistance (Pt.75%)  Function - Air cabin crew transfer activity did not occur: Safety/medical concerns Toilet transfer assistive device: Facilities manager lift: Stedy Assist level to toilet: 2 helpers Assist level from toilet: 2 helpers  Function - Chair/bed transfer Chair/bed transfer activity did not occur: Safety/medical concerns Chair/bed transfer method: Lateral scoot Chair/bed transfer assist level: Maximal assist (Pt 25 - 49%/lift and lower) Chair/bed transfer assistive device: Sliding board Mechanical lift: Stedy Chair/bed transfer details: Manual facilitation for placement, Manual facilitation for weight shifting, Visual cues for safe use of DME/AE, Visual cues/gestures for sequencing  Function - Locomotion: Wheelchair Will patient use wheelchair at discharge?: Yes Type: Manual Wheelchair activity did not occur: Safety/medical concerns Assist Level: Dependent (Pt equals 0%) Wheel 50 feet with 2 turns activity  did not occur: Safety/medical concerns Assist Level: Dependent (Pt equals 0%) Wheel 150 feet activity did not occur: Safety/medical concerns Assist Level: Dependent (Pt equals 0%) Turns around,maneuvers to table,bed, and toilet,negotiates 3% grade,maneuvers on rugs and over doorsills: No Function - Locomotion: Ambulation Ambulation activity did not occur: Safety/medical concerns Walk 10 feet activity did not occur: Safety/medical concerns Walk 50 feet with 2 turns activity did not occur: Safety/medical concerns Walk 150 feet activity did not occur: Safety/medical concerns Walk 10 feet on uneven surfaces activity did not occur: Safety/medical concerns  Function - Comprehension Comprehension: Auditory Comprehension assist level: Understands basic less than 25% of the time/ requires cueing >75% of the time  Function - Expression Expression: Verbal Expression assistive device: Other (Comment)([place]. ) Expression assist level: Expresses basis less than 25% of the time/requires cueing >75% of the time.  Function - Social Interaction Social Interaction assist level: Interacts appropriately less than 25% of the time. May be withdrawn or combative.  Function - Problem Solving Problem solving assist level: Solves basic less than 25% of the time - needs direction nearly all the time or does not effectively solve problems  and may need a restraint for safety  Function - Memory Memory assist level: Recognizes or recalls less than 25% of the time/requires cueing greater than 75% of the time Patient normally able to recall (first 3 days only): None of the above  Medical Problem List and Plan: 1. Functional deficits secondary to  due to L-MCA stroke with extension   Aphasia with Right spastic hemiplegia   Discharge to SNF 2.  DVT Prophylaxis/Anticoagulation: Pharmaceutical: Lovenox 3. Pain Management: tylenol prn, knee pain RIght improving with Voltaren gel 4. Mood: LCSW to follow for  evaluation when appropriate.  5. Neuropsych: This patient is not capable of making decisions on her own behalf. 6. Skin/Wound Care: Routine pressure relief measures. Maintain adequate nutritional and hydration status.  7. Fluids/Electrolytes/Nutrition: Monitor I/O.  8. T2DM: Hgb A1C- Monitor BS ac/hs and use SSI for elevated BS. Continue to titrate medications for tighter control.  CBG (last 3)  Recent Labs    07/13/17 1651 07/14/17 0631 07/14/17 0701  GLUCAP 138* 61* 98    Lantus adjusted to 35 units on 2/19 8U novalog with meals,    Overall improved occasional borderline low CBG    9. HTN: Improved control 2/16 Vitals:   07/13/17 1443 07/14/17 0352  BP: (!) 153/76 140/70  Pulse: 71 75  Resp: 18 18  Temp: 97.7 F (36.5 C) 98 F (36.7 C)  SpO2: 97% 99%    Started amlodipine 2/6,    Controlled 2/28   10. Fever: resolved   off Unasyn per ID. CXR neg , repeat urine neg, no DVT,recent ECHO no evidence of endocarditis no other symptoms  Hep A,B,C neg, HIV neg   Appreciate ID recs, signed off.    11. Dysphagia: Continue dysphagia 1, thin liquids  12. Recent episode of  Depression: Now onCelexa  13. Obesity             Body mass index is 32.91 kg/m.             Diet and exercise education             Encourage weight loss to increase endurance and promote overall health 14.  Hypokalemia    Supplemented x3 days   Potassium 4.1 on 2/15 15. Leukocytosis:Resolved 16. Hypernatremia  Resolved 17.   E. coli UTI which was resistant to Keflex.     Completed Bactrim 18. Urinary retention, resolved, continue, flomax and Urecholine to 25 mg 3 times daily  LOS (Days) 24 A FACE TO FACE EVALUATION WAS PERFORMED  Charlett Blake 07/14/2017, 10:26 AM

## 2017-07-19 ENCOUNTER — Ambulatory Visit: Payer: Medicare Other | Admitting: Nurse Practitioner

## 2017-07-25 ENCOUNTER — Ambulatory Visit: Payer: Medicare Other | Admitting: Nurse Practitioner

## 2017-07-25 ENCOUNTER — Telehealth: Payer: Self-pay | Admitting: Nurse Practitioner

## 2017-07-25 ENCOUNTER — Encounter: Payer: Self-pay | Admitting: Nurse Practitioner

## 2017-07-25 NOTE — Progress Notes (Deleted)
Referring Provider: Caren Macadam, MD Primary Care Physician:  Caren Macadam, MD Primary GI:  Dr. Oneida Alar  No chief complaint on file.   HPI:   Sandra [name] is a 77 y.o. female who presents for post procedure follow-up.  The patient was last seen in our office 02/11/2017 for constipation, rectal bleeding, hemorrhoids, history of adenomatous colon polyp.  History of hemorrhoids generally well managed on Anusol.  Minimal rectal bleeding in the emergency department earlier in the year which she stated had been ongoing for 7 years.  Noted external hemorrhoids on exam.  CBC normal.  Previous colonoscopy and endoscopy were completed at an outside facility with reports requested and reviewed including findings of poor prep, polyps, recommended 2-year repeat exam.  EGD with questionable mild stricture of the GE junction without H. pylori.  At her last visit she was doing well overall with continued toilet tissue spotting of red blood.  Working on dental care and having dentures made.  Denies dysphagia.  Rare flares of constipation, otherwise no overt GI symptoms.  Recommended colonoscopy with 2 days of clear liquids prior to procedure and extended prep, continue Anusol, follow-up based on post procedure recommendations.  Colonoscopy completed 03/21/2017 which found a single 4 mm polyp in the ascending colon, redundant colon, diverticulosis in the rectosigmoid colon, sigmoid colon, and descending colon.  Internal hemorrhoids status post banding.  Surgical pathology found the polyp to be tubular adenoma.  Recommended MiraLAX or Colace once to twice daily, next colonoscopy 10-15 years if benefits outweigh the risks.  Today she states     Past Medical History:  Diagnosis Date  . Colon polyp   . Constipation   . Diabetes mellitus without complication ([place])   . GI bleed   . Hemorrhoids   . High cholesterol   . Hypertension   . PAD (peripheral artery disease) ([place])   . Peripheral neuropathy   .  Rectal bleeding    "for 7 years"  . Rectal bleeding   . Stroke [facility])     Past Surgical History:  Procedure Laterality Date  . ABDOMINAL HYSTERECTOMY    . COLONOSCOPY     X 7  . COLONOSCOPY N/A 03/21/2017   Procedure: COLONOSCOPY;  Surgeon: Danie Binder, MD;  [facility];  Service: Endoscopy;  Laterality: N/A;  1215     Current Outpatient Medications  Medication Sig Dispense Refill  . acetaminophen (TYLENOL) 325 MG tablet Take 1-2 tablets (325-650 mg total) by mouth every 4 (four) hours as needed for mild pain.    Marland Kitchen amLODipine (NORVASC) 10 MG tablet Take 1 tablet (10 mg total) by mouth daily.    Marland Kitchen aspirin 325 MG EC tablet Take 1 tablet (325 mg total) by mouth daily. 30 tablet 0  . atorvastatin (LIPITOR) 40 MG tablet Take 1 tablet (40 mg total) by mouth daily at 6 PM.    . bethanechol (URECHOLINE) 25 MG tablet Take 1 tablet (25 mg total) by mouth 3 (three) times daily.    . citalopram (CELEXA) 10 MG tablet Take 10 mg by mouth daily.    . clopidogrel (PLAVIX) 75 MG tablet Take 1 tablet (75 mg total) by mouth daily with breakfast.    . insulin glargine (LANTUS) 100 UNIT/ML injection Inject 0.35 mLs (35 Units total) into the skin daily. 10 mL 11  . Multiple Vitamins-Minerals (ONE-A-DAY 50 PLUS PO) Take 1 tablet by mouth daily.    . protein supplement shake (PREMIER PROTEIN) LIQD Take 59.1  mLs (2 oz total) by mouth 4 (four) times daily.  0  . senna-docusate (SENOKOT-S) 8.6-50 MG tablet Take 2 tablets by mouth at bedtime.    . tamsulosin (FLOMAX) 0.4 MG CAPS capsule Take 1 capsule (0.4 mg total) by mouth daily after supper. 30 capsule   . vitamin B-12 (CYANOCOBALAMIN) 1000 MCG tablet Take 1,000 mcg by mouth daily.     No current facility-administered medications for this visit.     Allergies as of 07/25/2017 - Review Complete 06/21/2017  Allergen Reaction Noted  . Hctz [hydrochlorothiazide] Other (See Comments) 11/29/2016  . Morphine and related Other (See Comments)  06/16/2016  . Valium [diazepam] Other (See Comments) 05/29/2016    Family History  Problem Relation Age of Onset  . Diabetes Mother   . Cancer Father   . Diabetes Sister   . Diabetes Brother   . Diabetes Daughter   . Diabetes Maternal Grandmother   . Diabetes Daughter   . Colon cancer Neg Hx     Social History   Socioeconomic History  . Marital status: Widowed    Spouse name: Not on file  . Number of children: Not on file  . Years of education: Not on file  . Highest education level: Not on file  Social Needs  . Financial resource strain: Not on file  . Food insecurity - worry: Not on file  . Food insecurity - inability: Not on file  . Transportation needs - medical: Not on file  . Transportation needs - non-medical: Not on file  Occupational History  . Not on file  Tobacco Use  . Smoking status: Never Smoker  . Smokeless tobacco: Never Used  Substance and Sexual Activity  . Alcohol use: No  . Drug use: No  . Sexual activity: No  Other Topics Concern  . Not on file  Social History Narrative  . Not on file    Review of Systems: General: Negative for anorexia, weight loss, fever, chills, fatigue, weakness. Eyes: Negative for vision changes.  ENT: Negative for hoarseness, difficulty swallowing , nasal congestion. CV: Negative for chest pain, angina, palpitations, dyspnea on exertion, peripheral edema.  Respiratory: Negative for dyspnea at rest, dyspnea on exertion, cough, sputum, wheezing.  GI: See history of present illness. GU:  Negative for dysuria, hematuria, urinary incontinence, urinary frequency, nocturnal urination.  MS: Negative for joint pain, low back pain.  Derm: Negative for rash or itching.  Neuro: Negative for weakness, abnormal sensation, seizure, frequent headaches, memory loss, confusion.  Psych: Negative for anxiety, depression, suicidal ideation, hallucinations.  Endo: Negative for unusual weight change.  Heme: Negative for bruising or  bleeding. Allergy: Negative for rash or hives.   Physical Exam: There were no vitals taken for this visit. General:   Alert and oriented. Pleasant and cooperative. Well-nourished and well-developed.  Head:  Normocephalic and atraumatic. Eyes:  Without icterus, sclera clear and conjunctiva pink.  Ears:  Normal auditory acuity. Mouth:  No deformity or lesions, oral mucosa pink.  Throat/Neck:  Supple, without mass or thyromegaly. Cardiovascular:  S1, S2 present without murmurs appreciated. Normal pulses noted. Extremities without clubbing or edema. Respiratory:  Clear to auscultation bilaterally. No wheezes, rales, or rhonchi. No distress.  Gastrointestinal:  +BS, soft, non-tender and non-distended. No HSM noted. No guarding or rebound. No masses appreciated.  Rectal:  Deferred  Musculoskalatal:  Symmetrical without gross deformities. Normal posture. Skin:  Intact without significant lesions or rashes. Neurologic:  Alert and oriented x4;  grossly normal neurologically. Psych:  Alert and cooperative. Normal mood and affect. Heme/Lymph/Immune: No significant cervical adenopathy. No excessive bruising noted.    07/25/2017 8:41 AM   Disclaimer: This note was dictated with voice recognition software. Similar sounding words can inadvertently be transcribed and may not be corrected upon review.

## 2017-07-25 NOTE — Telephone Encounter (Signed)
Patient was a no show and letter sent  °

## 2017-07-26 NOTE — Telephone Encounter (Signed)
Noted  

## 2017-08-02 ENCOUNTER — Encounter: Payer: Self-pay | Admitting: Family Medicine

## 2017-08-15 ENCOUNTER — Encounter: Payer: Medicare Other | Attending: Physical Medicine & Rehabilitation

## 2017-08-15 ENCOUNTER — Inpatient Hospital Stay: Payer: Medicare Other | Admitting: Physical Medicine & Rehabilitation

## 2017-10-04 ENCOUNTER — Other Ambulatory Visit (HOSPITAL_COMMUNITY): Payer: Self-pay | Admitting: Internal Medicine

## 2017-10-04 DIAGNOSIS — R4182 Altered mental status, unspecified: Secondary | ICD-10-CM

## 2017-10-12 ENCOUNTER — Encounter (HOSPITAL_COMMUNITY): Payer: Self-pay | Admitting: Emergency Medicine

## 2017-10-12 ENCOUNTER — Other Ambulatory Visit: Payer: Self-pay

## 2017-10-12 ENCOUNTER — Inpatient Hospital Stay (HOSPITAL_COMMUNITY)
Admission: EM | Admit: 2017-10-12 | Discharge: 2017-10-29 | DRG: 391 | Disposition: A | Discharge status: Skilled Nursing Facility | Payer: Medicare Other | Attending: Internal Medicine | Admitting: Internal Medicine

## 2017-10-12 DIAGNOSIS — Z515 Encounter for palliative care: Secondary | ICD-10-CM

## 2017-10-12 DIAGNOSIS — R4701 Aphasia: Secondary | ICD-10-CM | POA: Diagnosis present

## 2017-10-12 DIAGNOSIS — I639 Cerebral infarction, unspecified: Secondary | ICD-10-CM

## 2017-10-12 DIAGNOSIS — R202 Paresthesia of skin: Secondary | ICD-10-CM | POA: Diagnosis present

## 2017-10-12 DIAGNOSIS — E11649 Type 2 diabetes mellitus with hypoglycemia without coma: Secondary | ICD-10-CM | POA: Diagnosis not present

## 2017-10-12 DIAGNOSIS — R1310 Dysphagia, unspecified: Secondary | ICD-10-CM | POA: Diagnosis present

## 2017-10-12 DIAGNOSIS — I472 Ventricular tachycardia: Secondary | ICD-10-CM | POA: Diagnosis present

## 2017-10-12 DIAGNOSIS — E1151 Type 2 diabetes mellitus with diabetic peripheral angiopathy without gangrene: Secondary | ICD-10-CM | POA: Diagnosis present

## 2017-10-12 DIAGNOSIS — G4089 Other seizures: Secondary | ICD-10-CM | POA: Diagnosis present

## 2017-10-12 DIAGNOSIS — Z7902 Long term (current) use of antithrombotics/antiplatelets: Secondary | ICD-10-CM

## 2017-10-12 DIAGNOSIS — E669 Obesity, unspecified: Secondary | ICD-10-CM | POA: Diagnosis present

## 2017-10-12 DIAGNOSIS — Z90710 Acquired absence of both cervix and uterus: Secondary | ICD-10-CM

## 2017-10-12 DIAGNOSIS — I69351 Hemiplegia and hemiparesis following cerebral infarction affecting right dominant side: Secondary | ICD-10-CM

## 2017-10-12 DIAGNOSIS — Z9114 Patient's other noncompliance with medication regimen: Secondary | ICD-10-CM

## 2017-10-12 DIAGNOSIS — F329 Major depressive disorder, single episode, unspecified: Secondary | ICD-10-CM | POA: Diagnosis present

## 2017-10-12 DIAGNOSIS — E876 Hypokalemia: Secondary | ICD-10-CM | POA: Diagnosis present

## 2017-10-12 DIAGNOSIS — N390 Urinary tract infection, site not specified: Secondary | ICD-10-CM | POA: Diagnosis present

## 2017-10-12 DIAGNOSIS — Z6833 Body mass index (BMI) 33.0-33.9, adult: Secondary | ICD-10-CM

## 2017-10-12 DIAGNOSIS — N179 Acute kidney failure, unspecified: Secondary | ICD-10-CM | POA: Diagnosis present

## 2017-10-12 DIAGNOSIS — I69320 Aphasia following cerebral infarction: Secondary | ICD-10-CM

## 2017-10-12 DIAGNOSIS — E1165 Type 2 diabetes mellitus with hyperglycemia: Secondary | ICD-10-CM | POA: Diagnosis present

## 2017-10-12 DIAGNOSIS — Z888 Allergy status to other drugs, medicaments and biological substances status: Secondary | ICD-10-CM

## 2017-10-12 DIAGNOSIS — K529 Noninfective gastroenteritis and colitis, unspecified: Secondary | ICD-10-CM | POA: Diagnosis not present

## 2017-10-12 DIAGNOSIS — E785 Hyperlipidemia, unspecified: Secondary | ICD-10-CM | POA: Diagnosis present

## 2017-10-12 DIAGNOSIS — Z86010 Personal history of colonic polyps: Secondary | ICD-10-CM

## 2017-10-12 DIAGNOSIS — E1169 Type 2 diabetes mellitus with other specified complication: Secondary | ICD-10-CM | POA: Diagnosis present

## 2017-10-12 DIAGNOSIS — R1110 Vomiting, unspecified: Secondary | ICD-10-CM | POA: Diagnosis not present

## 2017-10-12 DIAGNOSIS — L89320 Pressure ulcer of left buttock, unstageable: Secondary | ICD-10-CM | POA: Diagnosis present

## 2017-10-12 DIAGNOSIS — Z7982 Long term (current) use of aspirin: Secondary | ICD-10-CM

## 2017-10-12 DIAGNOSIS — E860 Dehydration: Secondary | ICD-10-CM | POA: Diagnosis present

## 2017-10-12 DIAGNOSIS — E43 Unspecified severe protein-calorie malnutrition: Secondary | ICD-10-CM

## 2017-10-12 DIAGNOSIS — R7989 Other specified abnormal findings of blood chemistry: Secondary | ICD-10-CM | POA: Diagnosis present

## 2017-10-12 DIAGNOSIS — Z79899 Other long term (current) drug therapy: Secondary | ICD-10-CM

## 2017-10-12 DIAGNOSIS — I10 Essential (primary) hypertension: Secondary | ICD-10-CM | POA: Diagnosis present

## 2017-10-12 DIAGNOSIS — R259 Unspecified abnormal involuntary movements: Secondary | ICD-10-CM

## 2017-10-12 DIAGNOSIS — K5900 Constipation, unspecified: Secondary | ICD-10-CM | POA: Diagnosis present

## 2017-10-12 DIAGNOSIS — E870 Hyperosmolality and hypernatremia: Secondary | ICD-10-CM | POA: Diagnosis present

## 2017-10-12 DIAGNOSIS — Z833 Family history of diabetes mellitus: Secondary | ICD-10-CM

## 2017-10-12 DIAGNOSIS — I69391 Dysphagia following cerebral infarction: Secondary | ICD-10-CM

## 2017-10-12 DIAGNOSIS — Z66 Do not resuscitate: Secondary | ICD-10-CM | POA: Diagnosis not present

## 2017-10-12 DIAGNOSIS — G252 Other specified forms of tremor: Secondary | ICD-10-CM | POA: Diagnosis present

## 2017-10-12 DIAGNOSIS — I69353 Hemiplegia and hemiparesis following cerebral infarction affecting right non-dominant side: Secondary | ICD-10-CM

## 2017-10-12 DIAGNOSIS — L89512 Pressure ulcer of right ankle, stage 2: Secondary | ICD-10-CM | POA: Diagnosis present

## 2017-10-12 DIAGNOSIS — E1149 Type 2 diabetes mellitus with other diabetic neurological complication: Secondary | ICD-10-CM | POA: Diagnosis present

## 2017-10-12 DIAGNOSIS — L8990 Pressure ulcer of unspecified site, unspecified stage: Secondary | ICD-10-CM

## 2017-10-12 DIAGNOSIS — Z794 Long term (current) use of insulin: Secondary | ICD-10-CM

## 2017-10-12 DIAGNOSIS — E1142 Type 2 diabetes mellitus with diabetic polyneuropathy: Secondary | ICD-10-CM | POA: Diagnosis present

## 2017-10-12 DIAGNOSIS — Z7189 Other specified counseling: Secondary | ICD-10-CM

## 2017-10-12 DIAGNOSIS — Z885 Allergy status to narcotic agent status: Secondary | ICD-10-CM

## 2017-10-12 DIAGNOSIS — G9341 Metabolic encephalopathy: Secondary | ICD-10-CM | POA: Diagnosis present

## 2017-10-12 DIAGNOSIS — K5641 Fecal impaction: Secondary | ICD-10-CM

## 2017-10-12 DIAGNOSIS — I959 Hypotension, unspecified: Secondary | ICD-10-CM | POA: Diagnosis not present

## 2017-10-12 NOTE — ED Triage Notes (Signed)
Per facility pt has vomited x6.

## 2017-10-13 ENCOUNTER — Emergency Department (HOSPITAL_COMMUNITY): Payer: Medicare Other

## 2017-10-13 ENCOUNTER — Inpatient Hospital Stay (HOSPITAL_COMMUNITY): Payer: Medicare Other

## 2017-10-13 ENCOUNTER — Inpatient Hospital Stay (HOSPITAL_COMMUNITY)
Admit: 2017-10-13 | Discharge: 2017-10-13 | Disposition: A | Discharge status: Home / Self Care | Payer: Medicare Other | Attending: Family Medicine | Admitting: Family Medicine

## 2017-10-13 DIAGNOSIS — Z66 Do not resuscitate: Secondary | ICD-10-CM | POA: Diagnosis not present

## 2017-10-13 DIAGNOSIS — G252 Other specified forms of tremor: Secondary | ICD-10-CM | POA: Diagnosis present

## 2017-10-13 DIAGNOSIS — E785 Hyperlipidemia, unspecified: Secondary | ICD-10-CM | POA: Diagnosis present

## 2017-10-13 DIAGNOSIS — E43 Unspecified severe protein-calorie malnutrition: Secondary | ICD-10-CM | POA: Diagnosis present

## 2017-10-13 DIAGNOSIS — L89320 Pressure ulcer of left buttock, unstageable: Secondary | ICD-10-CM | POA: Diagnosis present

## 2017-10-13 DIAGNOSIS — I639 Cerebral infarction, unspecified: Secondary | ICD-10-CM | POA: Diagnosis present

## 2017-10-13 DIAGNOSIS — G9341 Metabolic encephalopathy: Secondary | ICD-10-CM | POA: Diagnosis present

## 2017-10-13 DIAGNOSIS — I69353 Hemiplegia and hemiparesis following cerebral infarction affecting right non-dominant side: Secondary | ICD-10-CM | POA: Diagnosis not present

## 2017-10-13 DIAGNOSIS — Z794 Long term (current) use of insulin: Secondary | ICD-10-CM

## 2017-10-13 DIAGNOSIS — E1142 Type 2 diabetes mellitus with diabetic polyneuropathy: Secondary | ICD-10-CM | POA: Diagnosis present

## 2017-10-13 DIAGNOSIS — E1151 Type 2 diabetes mellitus with diabetic peripheral angiopathy without gangrene: Secondary | ICD-10-CM | POA: Diagnosis present

## 2017-10-13 DIAGNOSIS — I69391 Dysphagia following cerebral infarction: Secondary | ICD-10-CM | POA: Diagnosis not present

## 2017-10-13 DIAGNOSIS — K529 Noninfective gastroenteritis and colitis, unspecified: Secondary | ICD-10-CM | POA: Diagnosis present

## 2017-10-13 DIAGNOSIS — E1165 Type 2 diabetes mellitus with hyperglycemia: Secondary | ICD-10-CM | POA: Diagnosis present

## 2017-10-13 DIAGNOSIS — I361 Nonrheumatic tricuspid (valve) insufficiency: Secondary | ICD-10-CM | POA: Diagnosis not present

## 2017-10-13 DIAGNOSIS — E119 Type 2 diabetes mellitus without complications: Secondary | ICD-10-CM | POA: Diagnosis not present

## 2017-10-13 DIAGNOSIS — N179 Acute kidney failure, unspecified: Secondary | ICD-10-CM | POA: Diagnosis present

## 2017-10-13 DIAGNOSIS — E1169 Type 2 diabetes mellitus with other specified complication: Secondary | ICD-10-CM | POA: Diagnosis present

## 2017-10-13 DIAGNOSIS — R4701 Aphasia: Secondary | ICD-10-CM | POA: Diagnosis not present

## 2017-10-13 DIAGNOSIS — I959 Hypotension, unspecified: Secondary | ICD-10-CM | POA: Diagnosis not present

## 2017-10-13 DIAGNOSIS — I69351 Hemiplegia and hemiparesis following cerebral infarction affecting right dominant side: Secondary | ICD-10-CM | POA: Diagnosis not present

## 2017-10-13 DIAGNOSIS — R1110 Vomiting, unspecified: Secondary | ICD-10-CM | POA: Diagnosis present

## 2017-10-13 DIAGNOSIS — E870 Hyperosmolality and hypernatremia: Secondary | ICD-10-CM | POA: Diagnosis present

## 2017-10-13 DIAGNOSIS — E860 Dehydration: Secondary | ICD-10-CM | POA: Diagnosis present

## 2017-10-13 DIAGNOSIS — R1310 Dysphagia, unspecified: Secondary | ICD-10-CM | POA: Diagnosis present

## 2017-10-13 DIAGNOSIS — Z515 Encounter for palliative care: Secondary | ICD-10-CM | POA: Diagnosis not present

## 2017-10-13 DIAGNOSIS — N390 Urinary tract infection, site not specified: Secondary | ICD-10-CM | POA: Diagnosis present

## 2017-10-13 DIAGNOSIS — E11649 Type 2 diabetes mellitus with hypoglycemia without coma: Secondary | ICD-10-CM | POA: Diagnosis not present

## 2017-10-13 DIAGNOSIS — R259 Unspecified abnormal involuntary movements: Secondary | ICD-10-CM

## 2017-10-13 DIAGNOSIS — G4089 Other seizures: Secondary | ICD-10-CM | POA: Diagnosis present

## 2017-10-13 DIAGNOSIS — I69320 Aphasia following cerebral infarction: Secondary | ICD-10-CM | POA: Diagnosis not present

## 2017-10-13 DIAGNOSIS — I472 Ventricular tachycardia: Secondary | ICD-10-CM | POA: Diagnosis present

## 2017-10-13 DIAGNOSIS — K5900 Constipation, unspecified: Secondary | ICD-10-CM | POA: Diagnosis present

## 2017-10-13 DIAGNOSIS — E1149 Type 2 diabetes mellitus with other diabetic neurological complication: Secondary | ICD-10-CM | POA: Diagnosis present

## 2017-10-13 DIAGNOSIS — L89512 Pressure ulcer of right ankle, stage 2: Secondary | ICD-10-CM | POA: Diagnosis present

## 2017-10-13 DIAGNOSIS — I10 Essential (primary) hypertension: Secondary | ICD-10-CM | POA: Diagnosis not present

## 2017-10-13 DIAGNOSIS — Z7189 Other specified counseling: Secondary | ICD-10-CM | POA: Diagnosis not present

## 2017-10-13 LAB — CBC
HEMATOCRIT: 34.4 % — AB (ref 36.0–46.0)
Hemoglobin: 11.2 g/dL — ABNORMAL LOW (ref 12.0–15.0)
MCH: 27.1 pg (ref 26.0–34.0)
MCHC: 32.6 g/dL (ref 30.0–36.0)
MCV: 83.1 fL (ref 78.0–100.0)
Platelets: 359 10*3/uL (ref 150–400)
RBC: 4.14 MIL/uL (ref 3.87–5.11)
RDW: 16.8 % — ABNORMAL HIGH (ref 11.5–15.5)
WBC: 14.6 10*3/uL — ABNORMAL HIGH (ref 4.0–10.5)

## 2017-10-13 LAB — GLUCOSE, CAPILLARY
Glucose-Capillary: 183 mg/dL — ABNORMAL HIGH (ref 65–99)
Glucose-Capillary: 171 mg/dL — ABNORMAL HIGH (ref 65–99)
Glucose-Capillary: 252 mg/dL — ABNORMAL HIGH (ref 65–99)
Glucose-Capillary: 223 mg/dL — ABNORMAL HIGH (ref 65–99)

## 2017-10-13 LAB — COMPREHENSIVE METABOLIC PANEL
ALK PHOS: 103 U/L (ref 38–126)
ALK PHOS: 89 U/L (ref 38–126)
ALT: 18 U/L (ref 14–54)
ALT: 16 U/L (ref 14–54)
ANION GAP: 15 (ref 5–15)
ANION GAP: 12 (ref 5–15)
AST: 41 U/L (ref 15–41)
AST: 35 U/L (ref 15–41)
Albumin: 2.4 g/dL — ABNORMAL LOW (ref 3.5–5.0)
Albumin: 2 g/dL — ABNORMAL LOW (ref 3.5–5.0)
BILIRUBIN TOTAL: 0.7 mg/dL (ref 0.3–1.2)
BUN: 29 mg/dL — ABNORMAL HIGH (ref 6–20)
BUN: 32 mg/dL — ABNORMAL HIGH (ref 6–20)
CALCIUM: 7.6 mg/dL — AB (ref 8.9–10.3)
CHLORIDE: 103 mmol/L (ref 101–111)
CO2: 28 mmol/L (ref 22–32)
CO2: 30 mmol/L (ref 22–32)
CREATININE: 1.76 mg/dL — AB (ref 0.44–1.00)
Calcium: 8.3 mg/dL — ABNORMAL LOW (ref 8.9–10.3)
Chloride: 104 mmol/L (ref 101–111)
Creatinine, Ser: 1.7 mg/dL — ABNORMAL HIGH (ref 0.44–1.00)
GFR calc non Af Amer: 28 mL/min — ABNORMAL LOW (ref >60)
GFR, EST AFRICAN AMERICAN: 32 mL/min — AB (ref >60)
GFR, EST AFRICAN AMERICAN: 31 mL/min — AB (ref >60)
GFR, EST NON AFRICAN AMERICAN: 27 mL/min — AB (ref >60)
Glucose, Bld: 260 mg/dL — ABNORMAL HIGH (ref 65–99)
Glucose, Bld: 202 mg/dL — ABNORMAL HIGH (ref 65–99)
Potassium: 4.2 mmol/L (ref 3.5–5.1)
Potassium: 3.9 mmol/L (ref 3.5–5.1)
SODIUM: 146 mmol/L — AB (ref 135–145)
Sodium: 146 mmol/L — ABNORMAL HIGH (ref 135–145)
TOTAL PROTEIN: 5.8 g/dL — AB (ref 6.5–8.1)
Total Bilirubin: 0.9 mg/dL (ref 0.3–1.2)
Total Protein: 6.5 g/dL (ref 6.5–8.1)

## 2017-10-13 LAB — MRSA PCR SCREENING: MRSA BY PCR: NEGATIVE

## 2017-10-13 LAB — CBC WITH DIFFERENTIAL/PLATELET
Basophils Absolute: 0 10*3/uL (ref 0.0–0.1)
Basophils Relative: 0 %
Eosinophils Absolute: 0 10*3/uL (ref 0.0–0.7)
Eosinophils Relative: 0 %
HEMATOCRIT: 37.8 % (ref 36.0–46.0)
HEMOGLOBIN: 12.2 g/dL (ref 12.0–15.0)
LYMPHS ABS: 1 10*3/uL (ref 0.7–4.0)
LYMPHS PCT: 6 %
MCH: 26.8 pg (ref 26.0–34.0)
MCHC: 32.3 g/dL (ref 30.0–36.0)
MCV: 82.9 fL (ref 78.0–100.0)
MONOS PCT: 6 %
Monocytes Absolute: 1.1 10*3/uL — ABNORMAL HIGH (ref 0.1–1.0)
NEUTROS ABS: 15.5 10*3/uL — AB (ref 1.7–7.7)
NEUTROS PCT: 88 %
Platelets: 398 10*3/uL (ref 150–400)
RBC: 4.56 MIL/uL (ref 3.87–5.11)
RDW: 16.7 % — ABNORMAL HIGH (ref 11.5–15.5)
WBC: 17.5 10*3/uL — AB (ref 4.0–10.5)

## 2017-10-13 LAB — TROPONIN I
TROPONIN I: 0.03 ng/mL — AB (ref <0.03)
TROPONIN I: 0.04 ng/mL — AB (ref <0.03)
TROPONIN I: 0.05 ng/mL — AB (ref <0.03)
Troponin I: 0.04 ng/mL (ref <0.03)

## 2017-10-13 LAB — HEMOGLOBIN A1C
Hgb A1c MFr Bld: 5.4 % (ref 4.8–5.6)
Mean Plasma Glucose: 108.28 mg/dL

## 2017-10-13 LAB — LIPASE, BLOOD: Lipase: 37 U/L (ref 11–51)

## 2017-10-13 MED ORDER — AMLODIPINE BESYLATE 5 MG PO TABS: 10.0000 mg | ORAL_TABLET | Freq: Every day | ORAL | Status: DC

## 2017-10-13 MED ORDER — TAMSULOSIN HCL 0.4 MG PO CAPS
0.4000 mg | ORAL_CAPSULE | Freq: Every day | ORAL | Status: DC
  Administered 2017-10-13 – 2017-10-29 (×14): 0.4 mg via ORAL
  Filled 2017-10-13 (×14): qty 1

## 2017-10-13 MED ORDER — ONDANSETRON HCL 4 MG PO TABS
4.0000 mg | ORAL_TABLET | Freq: Four times a day (QID) | ORAL | Status: DC | PRN
  Administered 2017-10-18: 4 mg via ORAL
  Filled 2017-10-13: qty 1

## 2017-10-13 MED ORDER — ONDANSETRON HCL 4 MG/2ML IJ SOLN
4.0000 mg | Freq: Once | INTRAMUSCULAR | Status: AC
  Administered 2017-10-13: 4 mg via INTRAVENOUS
  Filled 2017-10-13: qty 2

## 2017-10-13 MED ORDER — ASPIRIN EC 325 MG PO TBEC: 325.0000 mg | DELAYED_RELEASE_TABLET | Freq: Every day | ORAL | Status: DC

## 2017-10-13 MED ORDER — METRONIDAZOLE IN NACL 5-0.79 MG/ML-% IV SOLN
500.0000 mg | Freq: Three times a day (TID) | INTRAVENOUS | Status: DC
  Administered 2017-10-13 – 2017-10-20 (×20): 500 mg via INTRAVENOUS
  Filled 2017-10-13 (×20): qty 100

## 2017-10-13 MED ORDER — SENNOSIDES-DOCUSATE SODIUM 8.6-50 MG PO TABS
2.0000 | ORAL_TABLET | Freq: Every day | ORAL | Status: DC
  Administered 2017-10-13 – 2017-10-24 (×9): 2 via ORAL
  Filled 2017-10-13 (×9): qty 2

## 2017-10-13 MED ORDER — SODIUM CHLORIDE 0.45 % IV SOLN
INTRAVENOUS | Status: DC
  Administered 2017-10-13 – 2017-10-15 (×6): via INTRAVENOUS

## 2017-10-13 MED ORDER — SORBITOL 70 % SOLN
960.0000 mL | TOPICAL_OIL | Freq: Once | ORAL | Status: AC
  Administered 2017-10-13: 960 mL via RECTAL
  Filled 2017-10-13: qty 473

## 2017-10-13 MED ORDER — ATORVASTATIN CALCIUM 40 MG PO TABS
40.0000 mg | ORAL_TABLET | Freq: Every day | ORAL | Status: DC
  Administered 2017-10-13 – 2017-10-29 (×14): 40 mg via ORAL
  Filled 2017-10-13 (×14): qty 1

## 2017-10-13 MED ORDER — ACETAMINOPHEN 325 MG PO TABS
325.0000 mg | ORAL_TABLET | ORAL | Status: DC | PRN
  Administered 2017-10-15 – 2017-10-28 (×5): 650 mg via ORAL
  Filled 2017-10-13 (×5): qty 2
  Filled 2017-10-13: qty 1

## 2017-10-13 MED ORDER — BETHANECHOL CHLORIDE 25 MG PO TABS
25.0000 mg | ORAL_TABLET | Freq: Three times a day (TID) | ORAL | Status: DC
  Administered 2017-10-13 – 2017-10-29 (×40): 25 mg via ORAL
  Filled 2017-10-13 (×58): qty 1

## 2017-10-13 MED ORDER — ENSURE ENLIVE PO LIQD
237.0000 mL | Freq: Two times a day (BID) | ORAL | Status: DC
  Administered 2017-10-13 – 2017-10-17 (×4): 237 mL via ORAL

## 2017-10-13 MED ORDER — INSULIN ASPART 100 UNIT/ML ~~LOC~~ SOLN
0.0000 [IU] | Freq: Three times a day (TID) | SUBCUTANEOUS | Status: DC
Start: 2017-10-13 — End: 2017-10-22
  Administered 2017-10-13 – 2017-10-14 (×2): 5 [IU] via SUBCUTANEOUS
  Administered 2017-10-14: 1 [IU] via SUBCUTANEOUS
  Administered 2017-10-14: 2 [IU] via SUBCUTANEOUS
  Administered 2017-10-15: 1 [IU] via SUBCUTANEOUS
  Administered 2017-10-16: 3 [IU] via SUBCUTANEOUS
  Administered 2017-10-16: 2 [IU] via SUBCUTANEOUS
  Administered 2017-10-16: 3 [IU] via SUBCUTANEOUS
  Administered 2017-10-17: 1 [IU] via SUBCUTANEOUS
  Administered 2017-10-17: 2 [IU] via SUBCUTANEOUS
  Administered 2017-10-18: 1 [IU] via SUBCUTANEOUS
  Administered 2017-10-19: 2 [IU] via SUBCUTANEOUS
  Administered 2017-10-19: 5 [IU] via SUBCUTANEOUS
  Administered 2017-10-19: 1 [IU] via SUBCUTANEOUS
  Administered 2017-10-20: 3 [IU] via SUBCUTANEOUS
  Administered 2017-10-20 – 2017-10-21 (×4): 2 [IU] via SUBCUTANEOUS
  Administered 2017-10-21: 3 [IU] via SUBCUTANEOUS
  Administered 2017-10-22: 2 [IU] via SUBCUTANEOUS

## 2017-10-13 MED ORDER — ONDANSETRON HCL 4 MG/2ML IJ SOLN
4.0000 mg | Freq: Four times a day (QID) | INTRAMUSCULAR | Status: DC | PRN
  Administered 2017-10-22 – 2017-10-23 (×2): 4 mg via INTRAVENOUS
  Filled 2017-10-13 (×2): qty 2

## 2017-10-13 MED ORDER — PREMIER PROTEIN SHAKE
2.0000 [oz_av] | Freq: Four times a day (QID) | ORAL | Status: DC
  Filled 2017-10-13 (×6): qty 325.31

## 2017-10-13 MED ORDER — CLOPIDOGREL BISULFATE 75 MG PO TABS
75.0000 mg | ORAL_TABLET | Freq: Every day | ORAL | Status: DC
  Administered 2017-10-15 – 2017-10-29 (×14): 75 mg via ORAL
  Filled 2017-10-13 (×16): qty 1

## 2017-10-13 MED ORDER — FENTANYL CITRATE (PF) 100 MCG/2ML IJ SOLN
50.0000 ug | Freq: Once | INTRAMUSCULAR | Status: AC
  Administered 2017-10-13: 50 ug via INTRAVENOUS
  Filled 2017-10-13: qty 2

## 2017-10-13 MED ORDER — ENOXAPARIN SODIUM 40 MG/0.4ML ~~LOC~~ SOLN: 40.0000 mg | SUBCUTANEOUS | Status: DC

## 2017-10-13 MED ORDER — CIPROFLOXACIN IN D5W 400 MG/200ML IV SOLN
400.0000 mg | Freq: Two times a day (BID) | INTRAVENOUS | Status: DC
  Administered 2017-10-13 – 2017-10-16 (×6): 400 mg via INTRAVENOUS
  Filled 2017-10-13 (×5): qty 200

## 2017-10-13 MED ORDER — SODIUM CHLORIDE 0.9 % IV BOLUS
1000.0000 mL | Freq: Once | INTRAVENOUS | Status: AC
  Administered 2017-10-13: 1000 mL via INTRAVENOUS

## 2017-10-13 MED ORDER — SODIUM CHLORIDE 0.9 % IV BOLUS
500.0000 mL | Freq: Once | INTRAVENOUS | Status: AC
  Administered 2017-10-13: 500 mL via INTRAVENOUS

## 2017-10-13 MED ORDER — IOPAMIDOL (ISOVUE-300) INJECTION 61%
100.0000 mL | Freq: Once | INTRAVENOUS | Status: AC | PRN
  Administered 2017-10-13: 75 mL via INTRAVENOUS

## 2017-10-13 MED ORDER — LEVETIRACETAM IN NACL 500 MG/100ML IV SOLN
500.0000 mg | Freq: Two times a day (BID) | INTRAVENOUS | Status: DC
  Administered 2017-10-13 – 2017-10-18 (×10): 500 mg via INTRAVENOUS
  Filled 2017-10-13 (×10): qty 100

## 2017-10-13 MED ORDER — ENOXAPARIN SODIUM 30 MG/0.3ML ~~LOC~~ SOLN
30.0000 mg | SUBCUTANEOUS | Status: DC
  Administered 2017-10-13 – 2017-10-17 (×5): 30 mg via SUBCUTANEOUS
  Filled 2017-10-13 (×5): qty 0.3

## 2017-10-13 MED ORDER — SODIUM CHLORIDE 0.9 % IV SOLN: INTRAVENOUS | Status: DC

## 2017-10-13 MED ORDER — CITALOPRAM HYDROBROMIDE 20 MG PO TABS
10.0000 mg | ORAL_TABLET | Freq: Every day | ORAL | Status: DC
  Administered 2017-10-15 – 2017-10-19 (×5): 10 mg via ORAL
  Filled 2017-10-13 (×6): qty 1

## 2017-10-13 NOTE — Progress Notes (Signed)
50 Dr.Doonquah's office notified regarding neurology consult for patient.

## 2017-10-13 NOTE — Consult Note (Signed)
Vanleer A. Merlene Laughter, MD     [email or web address]          Sandra [name] is an 77 y.o. female.   ASSESSMENT/PLAN: 1.  Altered mental status: This is most likely multifactorial including baseline severe global aphasia and acute medical illness.  2.  Previous large left MCA infarct: Risk factors diabetes, hypertension and age.  Continue with aspirin 325.  3.  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  Keppra 500 mg twice a day will be initiated.     The patient presents with abdominal complaints.  Family reports that over 2 weeks she has had a overall downturn in cognitive function.  She has a baseline large left MCA infarct with global severe aphasia.  However, it appears that her cognition has gotten worse over the last 2 weeks.  Additionally, she was noted to have jerking movement of the right upper extremity.  The time span on that seemed to be a symptom that she has had a downturn in cognition.  The review of systems is limited due to the severe aphasia.  The nursing staff reports that she has not been eating well.     GENERAL: She is resting well in bed and in no acute distress.  HEENT: Neck is supple and without evidence of trauma.  ABDOMEN: soft  EXTREMITIES: No edema   BACK: Normal  SKIN: Normal by inspection.    MENTAL STATUS: She is awake and alert.  She has a severe global aphasia.  She is essentially mute.  She does not follow commands.  CRANIAL NERVES: Pupils are equal, round and reactive to light and accomodation; extra ocular movements are full, there is no significant nystagmus; visual fields -reveals a dense right homonymous hemianopia; upper and lower facial muscles are normal in strength and symmetric, there is no flattening of the nasolabial folds; tongue is midline; uvula is midline; shoulder elevation is normal.  MOTOR: She moves the right side much less than the  left side.  She has at least antigravity strength on the left side.  Bulk and tone seems normal throughout.  COORDINATION: She has continuous flexion contraction of the right upper extremity including the elbow and shoulder.  REFLEXES: Deep tendon reflexes are symmetrical and normal. Plantar reflexes are flexor bilaterally.   SENSATION: She responds to pain bilaterally but less on the right side.        NEURO NOTE 06-16-2017 1. Subacute left hemispheric stroke: Risk factors hypertension, diabetes, age, peripheral vascular disease and dyslipidemia. I suspect that the etiology is from intracranial thromboembolic phenomena but the MRA is significantly degraded by motion artifact. Consequent, repeat imaging with a head CTA will be obtained.I recommend dual antiplatelet agents aspirin/Plavix for 3 months. Subsequently, single agent can be used. I think we should increase the patient's statin given her LDL 150.       Patient is an 77 year old white female who presents with that two week history of apparent dysarthria.  She apparently has not been taking her baseline medications for the past three weeks.  She also appears to have had some language impairment possibly based upon the evaluation by the admitting physician and the emergency room physician.  The language impairment seems mostly difficulty understanding over the last couple of days. The review of systems is not obtainable given the patient'pairment.        Blood pressure (!) 98/57, pulse 69, temperature 99.9 F (37.7 C), temperature source Rectal,  resp. rate 16, weight 169 lb 8.5 oz (76.9 kg), SpO2 100 %.  Past Medical History:  Diagnosis Date  . Colon polyp   . Constipation   . Diabetes mellitus without complication ([place])   . GI bleed   . Hemorrhoids   . High cholesterol   . Hypertension   . PAD (peripheral artery disease) ([place])   . Peripheral neuropathy   . Rectal bleeding    "for 7 years"  . Rectal bleeding   .  Stroke [facility])     Past Surgical History:  Procedure Laterality Date  . ABDOMINAL HYSTERECTOMY    . COLONOSCOPY     X 7  . COLONOSCOPY N/A 03/21/2017   Procedure: COLONOSCOPY;  Surgeon: Danie Binder, MD;  [facility];  Service: Endoscopy;  Laterality: N/A;  1215     Family History  Problem Relation Age of Onset  . Diabetes Mother   . Cancer Father   . Diabetes Sister   . Diabetes Brother   . Diabetes Daughter   . Diabetes Maternal Grandmother   . Diabetes Daughter   . Colon cancer Neg Hx     Social History:  reports that she has never smoked. She has never used smokeless tobacco. She reports that she does not drink alcohol or use drugs.  Allergies:  Allergies  Allergen Reactions  . Hctz [Hydrochlorothiazide] Other (See Comments)    Caused pancreatitis flare up  . Morphine And Related Other (See Comments)    dizziness  . Valium [Diazepam] Other (See Comments)    Confusion, blurred vision    Medications: Prior to Admission medications   Medication Sig Start Date End Date Taking? Authorizing Provider  amLODipine (NORVASC) 10 MG tablet Take 1 tablet (10 mg total) by mouth daily. 07/14/17  Yes Love, Ivan Anchors, PA-C  aspirin 325 MG EC tablet Take 1 tablet (325 mg total) by mouth daily. 07/14/17  Yes Love, Ivan Anchors, PA-C  atorvastatin (LIPITOR) 40 MG tablet Take 1 tablet (40 mg total) by mouth daily at 6 PM. 06/20/17  Yes Johnson, Clanford L, MD  bethanechol (URECHOLINE) 25 MG tablet Take 1 tablet (25 mg total) by mouth 3 (three) times daily. 07/14/17  Yes Love, Ivan Anchors, PA-C  bisacodyl (DULCOLAX) 10 MG suppository Place 10 mg rectally once as needed for moderate constipation.   Yes [provider]  citalopram (CELEXA) 10 MG tablet Take 10 mg by mouth daily.   Yes [provider]  clopidogrel (PLAVIX) 75 MG tablet Take 1 tablet (75 mg total) by mouth daily with breakfast. 06/21/17  Yes Johnson, Clanford L, MD  collagenase (SANTYL) ointment Apply 1  application topically daily.   Yes [provider]  gabapentin (NEURONTIN) 100 MG capsule Take 100 mg by mouth 3 (three) times daily.   Yes [provider]  insulin glargine (LANTUS) 100 UNIT/ML injection Inject 0.35 mLs (35 Units total) into the skin daily. Patient taking differently: Inject 10 Units into the skin daily.  07/14/17  Yes Love, Ivan Anchors, PA-C  metFORMIN (GLUCOPHAGE) 500 MG tablet Take by mouth 3 (three) times daily.   Yes [provider]  Multiple Vitamins-Minerals (ONE-A-DAY 50 PLUS PO) Take 1 tablet by mouth daily.   Yes [provider]  senna-docusate (SENOKOT-S) 8.6-50 MG tablet Take 2 tablets by mouth at bedtime. Patient taking differently: Take 2 tablets by mouth daily.  07/14/17  Yes Love, Ivan Anchors, PA-C  tamsulosin (FLOMAX) 0.4 MG CAPS capsule Take 1 capsule (0.4  mg total) by mouth daily after supper. 07/14/17  Yes Love, Ivan Anchors, PA-C  traMADol (ULTRAM) 50 MG tablet Take 50 mg by mouth every 6 (six) hours as needed.   Yes [provider]  vitamin B-12 (CYANOCOBALAMIN) 1000 MCG tablet Take 1,000 mcg by mouth daily.   Yes [provider]  acetaminophen (TYLENOL) 325 MG tablet Take 1-2 tablets (325-650 mg total) by mouth every 4 (four) hours as needed for mild pain. 07/14/17   Bary Leriche, PA-C    Scheduled Meds: . atorvastatin  40 mg Oral q1800  . bethanechol  25 mg Oral TID  . citalopram  10 mg Oral Daily  . clopidogrel  75 mg Oral Q breakfast  . enoxaparin (LOVENOX) injection  30 mg Subcutaneous Q24H  . feeding supplement (ENSURE ENLIVE)  237 mL Oral BID BM  . insulin aspart  0-9 Units Subcutaneous TID WC  . senna-docusate  2 tablet Oral QHS  . sorbitol, milk of mag, mineral oil, glycerin (SMOG) enema  960 mL Rectal Once  . tamsulosin  0.4 mg Oral QPC supper   Continuous Infusions: . sodium chloride 140 mL/hr at 10/13/17 0912  . ciprofloxacin    . metronidazole     PRN Meds:.acetaminophen, ondansetron **OR**  ondansetron (ZOFRAN) IV     Results for orders placed or performed during the hospital encounter of 10/12/17 (from the past 48 hour(s))  Comprehensive metabolic panel     Status: Abnormal   Collection Time: 10/13/17  1:04 AM  Result Value Ref Range   Sodium 146 (H) 135 - 145 mmol/L   Potassium 4.2 3.5 - 5.1 mmol/L   Chloride 103 101 - 111 mmol/L   CO2 28 22 - 32 mmol/L   Glucose, Bld 260 (H) 65 - 99 mg/dL   BUN 29 (H) 6 - 20 mg/dL   Creatinine, Ser 1.70 (H) 0.44 - 1.00 mg/dL   Calcium 8.3 (L) 8.9 - 10.3 mg/dL   Total Protein 6.5 6.5 - 8.1 g/dL   Albumin 2.4 (L) 3.5 - 5.0 g/dL   AST 41 15 - 41 U/L   ALT 18 14 - 54 U/L   Alkaline Phosphatase 103 38 - 126 U/L   Total Bilirubin 0.9 0.3 - 1.2 mg/dL   GFR calc non Af Amer 28 (L) >60 mL/min   GFR calc Af Amer 32 (L) >60 mL/min    Comment: (NOTE) The eGFR has been calculated using the CKD EPI equation. This calculation has not been validated in all clinical situations. eGFR's persistently <60 mL/min signify possible Chronic Kidney Disease.    Anion gap 15 5 - 15    Comment: Performed at [facility], [address] [place], [place] [address]  Lipase, blood     Status: None   Collection Time: 10/13/17  1:04 AM  Result Value Ref Range   Lipase 37 11 - 51 U/L    Comment: Performed at [facility], [address], [place], [place] [address]  CBC with Differential     Status: Abnormal   Collection Time: 10/13/17  1:04 AM  Result Value Ref Range   WBC 17.5 (H) 4.0 - 10.5 K/uL   RBC 4.56 3.87 - 5.11 MIL/uL   Hemoglobin 12.2 12.0 - 15.0 g/dL   HCT 37.8 36.0 - 46.0 %   MCV 82.9 78.0 - 100.0 fL   MCH 26.8 26.0 - 34.0 pg   MCHC 32.3 30.0 - 36.0 g/dL   RDW 16.7 (H) 11.5 - 15.5 %  Platelets 398 150 - 400 K/uL   Neutrophils Relative % 88 %   Neutro Abs 15.5 (H) 1.7 - 7.7 K/uL   Lymphocytes Relative 6 %   Lymphs Abs 1.0 0.7 - 4.0 K/uL   Monocytes Relative 6 %   Monocytes Absolute 1.1 (H) 0.1 - 1.0 K/uL   Eosinophils Relative 0 %    Eosinophils Absolute 0.0 0.0 - 0.7 K/uL   Basophils Relative 0 %   Basophils Absolute 0.0 0.0 - 0.1 K/uL    Comment: Performed at [facility], [address] [place], [place] [address]  Troponin I     Status: Abnormal   Collection Time: 10/13/17  1:04 AM  Result Value Ref Range   Troponin I 0.03 (HH) <0.03 ng/mL    Comment: CRITICAL RESULT CALLED TO, READ BACK BY AND VERIFIED WITH: BELTON,B @ 0152 ON 5.30.19 BY BOWMAN,L Performed at [facility], [address] [place], [place] [address]   Glucose, capillary     Status: Abnormal   Collection Time: 10/13/17  5:22 AM  Result Value Ref Range   Glucose-Capillary 183 (H) 65 - 99 mg/dL   Comment 1 Notify RN    Comment 2 Document in Chart   Hemoglobin A1c     Status: None   Collection Time: 10/13/17  6:54 AM  Result Value Ref Range   Hgb A1c MFr Bld 5.4 4.8 - 5.6 %    Comment: (NOTE) Pre diabetes:          5.7%-6.4% Diabetes:              >6.4% Glycemic control for   <7.0% adults with diabetes    Mean Plasma Glucose 108.28 mg/dL    Comment: Performed at [facility] [address] [place], [place] [address]  CBC     Status: Abnormal   Collection Time: 10/13/17  6:54 AM  Result Value Ref Range   WBC 14.6 (H) 4.0 - 10.5 K/uL   RBC 4.14 3.87 - 5.11 MIL/uL   Hemoglobin 11.2 (L) 12.0 - 15.0 g/dL   HCT 34.4 (L) 36.0 - 46.0 %   MCV 83.1 78.0 - 100.0 fL   MCH 27.1 26.0 - 34.0 pg   MCHC 32.6 30.0 - 36.0 g/dL   RDW 16.8 (H) 11.5 - 15.5 %   Platelets 359 150 - 400 K/uL    Comment: Performed at [facility], [address], [place], [place] [address]  Comprehensive metabolic panel     Status: Abnormal   Collection Time: 10/13/17  6:54 AM  Result Value Ref Range   Sodium 146 (H) 135 - 145 mmol/L   Potassium 3.9 3.5 - 5.1 mmol/L   Chloride 104 101 - 111 mmol/L   CO2 30 22 - 32 mmol/L   Glucose, Bld 202 (H) 65 - 99 mg/dL   BUN 32 (H) 6 - 20 mg/dL   Creatinine, Ser 1.76 (H) 0.44 - 1.00 mg/dL   Calcium 7.6 (L) 8.9 - 10.3 mg/dL    Total Protein 5.8 (L) 6.5 - 8.1 g/dL   Albumin 2.0 (L) 3.5 - 5.0 g/dL   AST 35 15 - 41 U/L   ALT 16 14 - 54 U/L   Alkaline Phosphatase 89 38 - 126 U/L   Total Bilirubin 0.7 0.3 - 1.2 mg/dL   GFR calc non Af Amer 27 (L) >60 mL/min   GFR calc Af Amer 31 (L) >60 mL/min    Comment: (NOTE) The eGFR has been calculated using  the CKD EPI equation. This calculation has not been validated in all clinical situations. eGFR's persistently <60 mL/min signify possible Chronic Kidney Disease.    Anion gap 12 5 - 15    Comment: Performed at [facility], [address], [place], [place] [address]  Troponin I (q 6hr x 3)     Status: Abnormal   Collection Time: 10/13/17  6:54 AM  Result Value Ref Range   Troponin I 0.04 (HH) <0.03 ng/mL    Comment: CRITICAL RESULT CALLED TO, READ BACK BY AND VERIFIED WITH: MARTIN,D AT 7:40AM ON 10/13/17 BY Derrill Memo Performed at [facility], [address], [place], [place] [address]   Glucose, capillary     Status: Abnormal   Collection Time: 10/13/17 11:34 AM  Result Value Ref Range   Glucose-Capillary 171 (H) 65 - 99 mg/dL  Troponin I (q 6hr x 3)     Status: Abnormal   Collection Time: 10/13/17  1:39 PM  Result Value Ref Range   Troponin I 0.04 (HH) <0.03 ng/mL    Comment: CRITICAL VALUE NOTED.  VALUE IS CONSISTENT WITH PREVIOUSLY REPORTED AND CALLED VALUE. Performed at [facility], [address], [place], [place] [address]     Studies/Results:  BRAIN MRI MRA CLINICAL DATA:  Altered mental status over the last 2 days. Left MCA stroke.  EXAM: MRI HEAD WITHOUT CONTRAST  MRA HEAD WITHOUT CONTRAST  TECHNIQUE: Multiplanar, multiecho pulse sequences of the brain and surrounding structures were obtained without intravenous contrast. Angiographic images of the head were obtained using MRA technique without contrast.  COMPARISON:  CT 06/17/2017.  MRI 06/16/2017.  FINDINGS: MRI HEAD FINDINGS  Brain: Diffusion imaging shows evidence of the  old left MCA territory infarction. Superior and medial to the region of old infarction, there is an area of questionable restricted diffusion that could represent extension along the posterior margin. This signal does not show strong hypointensity on the ADC maps in therefore is not absolutely certain to represent a recent change in could be related to the previous infarction.  There is cerebellar atrophy. No focal brainstem finding. Cerebral hemispheres elsewhere show chronic small-vessel ischemic changes of the white matter. No other large vessel territory infarction. There is some residual hemosiderin deposition in the region of old infarction. No suspicion of acute hemorrhage. No hydrocephalus or extra-axial collection.  Vascular: Major vessels at the base of the brain show flow.  Skull and upper cervical spine: Negative  Sinuses/Orbits: Clear/normal  Other: None  MRA HEAD FINDINGS  Severely motion degraded exam. Both internal carotid arteries are patent through the skull base and siphon regions. There is flow in both anterior cerebral arteries and in the right middle cerebral artery. Distal left MCA branch vessels do not show flow.  Both vertebral arteries show flow to the basilar. The basilar is patent without likely stenosis. Posterior circulation branch vessels show flow, but detail is quite limited.  IMPRESSION: Severely motion degraded MR angiography. Diminished flow in the distal left MCA branches.  Old left MCA territory infarction with atrophy, encephalomalacia and gliosis. Question acute extension along the posteromedial margin of the infarction at the high left parietal region. This is not definite, as there does not appear to be marked signal reduction on the ADC map. It is possible this signal pattern could relate to the old infarction/T2 shine through.     The brain MRI and MRA reviewed and person. No clearly ischemic areas are observed.  There is increased signal involving the posterior frontal region  and parietal area on the left side. Similar findings are seen L frontal MCA - Large encephalomalacia. So this area involving the large encephalomalacia is associated with slightly reduced signal on the ADC scan suggestive of possibly a acute ischemia but the overall sense is that this is not the case. The increased signal involving the uninfarct area of the left frontal parietal area is limited to the cortical grim. SWI shows significant reduced signal involving the infarcted MCA region on the left side indicating remote microhemorrhage. MRA is limited due to motion artifact and shows cough of the bilateral PCA and significantly reduced flow involving the MCA bilaterally although again this is limited by motion.     Rajah Lamba A. Merlene Laughter, M.D.  Diplomate, Tax adviser of Psychiatry and Neurology ( Neurology). 10/13/2017, 4:23 PM

## 2017-10-13 NOTE — ED Provider Notes (Signed)
[facility] EMERGENCY DEPARTMENT Provider Note   CSN: [id number] Arrival date & time: 10/12/17  2334     History   Chief Complaint Chief Complaint  Patient presents with  . Abdominal Pain    HPI Shalandria Elsbernd is a 77 y.o. female.  This patient is a 77 year old female with history of prior CVA with global aphasia, diabetes, hypertension, peripheral artery disease.  She was sent from her extended care facility today for evaluation of vomiting.  I am told she has vomited 6 or 7 times this afternoon.  The care providers felt as though she was having abdominal pain and were concerned about a bowel obstruction.  Patient adds very little history secondary to prior CVA/aphasia.  The history is provided by the patient and the nursing home. The history is limited by the condition of the patient.  Abdominal Pain   This is a new problem. The current episode started yesterday. The problem occurs constantly. The problem has been rapidly worsening. The pain is associated with an unknown factor. The pain is located in the generalized abdominal region (Unable to specify). Pertinent negatives include fever. Nothing aggravates the symptoms. Nothing relieves the symptoms.    Past Medical History:  Diagnosis Date  . Colon polyp   . Constipation   . Diabetes mellitus without complication ([place])   . GI bleed   . Hemorrhoids   . High cholesterol   . Hypertension   . PAD (peripheral artery disease) ([place])   . Peripheral neuropathy   . Rectal bleeding    "for 7 years"  . Rectal bleeding   . Stroke [facility])     Patient Active Problem List   Diagnosis Date Noted  . Labile blood glucose   . Urinary retention   . Global aphasia   . Hemiparesthesia   . 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 Acute ischemic right MCA stroke ([place]) 06/20/2017  . Acute ischemic stroke ([place])   . Diabetes mellitus type 2 in obese ([place])   . Benign essential HTN   .  Noncompliance   . FUO (fever of unknown origin)   . Leukocytosis   . Dysphagia, post-stroke   . TIA (transient ischemic attack) 06/15/2017  . Hyperlipidemia associated with type 2 diabetes mellitus ([place]) 06/14/2017  . Type 2 diabetes mellitus with neurological complications ([place]) 29/51/8841  . Stroke ([place]) 03/31/2017  . History of adenomatous polyp of colon 02/11/2017  . Essential hypertension 08/12/2016  . Hemorrhoids 06/16/2016  . Constipation 06/16/2016    Past Surgical History:  Procedure Laterality Date  . ABDOMINAL HYSTERECTOMY    . COLONOSCOPY     X 7  . COLONOSCOPY N/A 03/21/2017   Procedure: COLONOSCOPY;  Surgeon: Danie Binder, MD;  Location: [facility] ENDO SUITE;  Service: Endoscopy;  Laterality: N/A;  55      OB History    Gravida      Para      Term      Preterm      AB      Living  6     SAB      TAB      Ectopic      Multiple      Live Births               Home Medications    Prior to Admission medications   Medication Sig Start Date End Date Taking? Authorizing Provider  acetaminophen (TYLENOL) 325  MG tablet Take 1-2 tablets (325-650 mg total) by mouth every 4 (four) hours as needed for mild pain. 07/14/17   Love, Ivan Anchors, PA-C  amLODipine (NORVASC) 10 MG tablet Take 1 tablet (10 mg total) by mouth daily. 07/14/17   Love, Ivan Anchors, PA-C  aspirin 325 MG EC tablet Take 1 tablet (325 mg total) by mouth daily. 07/14/17   Love, Ivan Anchors, PA-C  atorvastatin (LIPITOR) 40 MG tablet Take 1 tablet (40 mg total) by mouth daily at 6 PM. 06/20/17   Johnson, Clanford L, MD  bethanechol (URECHOLINE) 25 MG tablet Take 1 tablet (25 mg total) by mouth 3 (three) times daily. 07/14/17   Love, Ivan Anchors, PA-C  citalopram (CELEXA) 10 MG tablet Take 10 mg by mouth daily.    [provider]  clopidogrel (PLAVIX) 75 MG tablet Take 1 tablet (75 mg total) by mouth daily with breakfast. 06/21/17   Johnson, Clanford L, MD  insulin glargine (LANTUS) 100 UNIT/ML  injection Inject 0.35 mLs (35 Units total) into the skin daily. 07/14/17   Love, Ivan Anchors, PA-C  Multiple Vitamins-Minerals (ONE-A-DAY 50 PLUS PO) Take 1 tablet by mouth daily.    [provider]  protein supplement shake (PREMIER PROTEIN) LIQD Take 59.1 mLs (2 oz total) by mouth 4 (four) times daily. 07/14/17   Love, Ivan Anchors, PA-C  senna-docusate (SENOKOT-S) 8.6-50 MG tablet Take 2 tablets by mouth at bedtime. 07/14/17   Love, Ivan Anchors, PA-C  tamsulosin (FLOMAX) 0.4 MG CAPS capsule Take 1 capsule (0.4 mg total) by mouth daily after supper. 07/14/17   Love, Ivan Anchors, PA-C  vitamin B-12 (CYANOCOBALAMIN) 1000 MCG tablet Take 1,000 mcg by mouth daily.    [provider]    Family History Family History  Problem Relation Age of Onset  . Diabetes Mother   . Cancer Father   . Diabetes Sister   . Diabetes Brother   . Diabetes Daughter   . Diabetes Maternal Grandmother   . Diabetes Daughter   . Colon cancer Neg Hx     Social History Social History   Tobacco Use  . Smoking status: Never Smoker  . Smokeless tobacco: Never Used  Substance Use Topics  . Alcohol use: No  . Drug use: No     Allergies   Hctz [hydrochlorothiazide]; Morphine and related; and Valium [diazepam]   Review of Systems Review of Systems  Constitutional: Negative for fever.  Gastrointestinal: Positive for abdominal pain.  All other systems reviewed and are negative.    Physical Exam Updated Vital Signs BP 108/73   Pulse 96   Temp 99.9 F (37.7 C) (Rectal)   Resp (!) 26   SpO2 94%   Physical Exam  Constitutional: She is oriented to person, place, and time. She appears well-developed and well-nourished. No distress.  HENT:  Head: Normocephalic and atraumatic.  Neck: Normal range of motion. Neck supple.  Cardiovascular: Normal rate and regular rhythm. Exam reveals no gallop and no friction rub.  No murmur heard. Pulmonary/Chest: Effort normal and breath sounds normal. No respiratory  distress. She has no wheezes.  Abdominal: Soft. Bowel sounds are normal. She exhibits no distension. There is generalized tenderness. There is no rigidity, no rebound and no guarding.  There is generalized abdominal tenderness.  Musculoskeletal: Normal range of motion.  Neurological: She is alert and oriented to person, place, and time.  Skin: Skin is warm and dry. She is not diaphoretic.  Nursing note and vitals reviewed.  ED Treatments / Results  Labs (all labs ordered are listed, but only abnormal results are displayed) Labs Reviewed  COMPREHENSIVE METABOLIC PANEL  LIPASE, BLOOD  CBC WITH DIFFERENTIAL/PLATELET  TROPONIN I    EKG None  Radiology No results found.  Procedures Procedures (including critical care time)  Medications Ordered in ED Medications  sodium chloride 0.9 % bolus 1,000 mL (has no administration in time range)  ondansetron (ZOFRAN) injection 4 mg (has no administration in time range)  fentaNYL (SUBLIMAZE) injection 50 mcg (has no administration in time range)     Initial Impression / Assessment and Plan / ED Course  I have reviewed the triage vital signs and the nursing notes.  Pertinent labs & imaging results that were available during my care of the patient were reviewed by me and considered in my medical decision making (see chart for details).  Patient sent from the extended care facility for evaluation of abdominal pain and vomiting.  Her CT scan shows colitis.  She has a leukocytosis on her laboratory studies.  She appears somewhat pale and blood pressure initially was somewhat soft.  She was given IV fluid with some improvement.  I do not feel as though the patient is appropriate to return to her current living environment and believe admission is in her best interest.  I have spoken with Dr. Darrick Meigs who agrees to admit.  Final Clinical Impressions(s) / ED Diagnoses   Final diagnoses:  None    ED Discharge Orders    None       Veryl Speak, MD 10/13/17 [phone]

## 2017-10-13 NOTE — Progress Notes (Addendum)
10/12/2017 11:37 PM  10/13/2017 10:54 AM  Sandra [name] was seen and examined.  The H&P by the admitting provider, orders, imaging was reviewed.  Please see new orders.  Will continue to follow.  I had a long discussion with the patient's son [name] and he reports that the patient has had neurological changes in the last 2 weeks and he has been trying to have imaging done to check for another stroke.  The patient apparently had an acute decline in mentation and participation 2 weeks ago in the facility.  She also has subsequently developed a resting tremor mostly involving the right side that is new within these last 2 weeks.  I explained to him that the patient has an impaction and is severely constipated and needs to have disimpaction and an enema has been ordered.  The patient will remain n.p.o. for now.  Hypotonic IV fluids ordered to treat hypernatremia and acute renal failure.  The patient's son would like to pursue care at another skilled nursing facility.  He is requesting the [facility] center.  I updated the social worker during morning rounds who will check into the [facility].  In addition, the patient has been having some soft blood pressures and fluctuating mental status changes in addition to the resting tremor.  The patient is being transferred to the stepdown unit for closer monitoring.  Will bolus IV fluids for low blood pressures if needed.  Monitor blood glucose closely.  I have consulted speech therapy to assist with recommendations for diet.  In addition we will order an MRI and ask for neurology consultation regarding the tremors.  I am concerned that she may be having partial seizures.  I will also order an EEG.  Also, the patient was only supposed to be on aspirin and Plavix for 90 days and then was supposed to continue Plavix daily after that.  That 90 days has passed so we will discontinue the aspirin and continue Plavix.  Murvin Natal, MD [facility]

## 2017-10-13 NOTE — Progress Notes (Signed)
EEG completed, results pending. 

## 2017-10-13 NOTE — Progress Notes (Addendum)
Initial Nutrition Assessment  DOCUMENTATION CODES:  Not applicable  INTERVENTION:  Monitor PO intake.  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  GOAL:   Patient will meet greater than or equal to 90% of their needs  MONITOR:   PO intake, Supplement acceptance, Weight trends  REASON FOR ASSESSMENT:   Low Braden   ASSESSMENT:  77 y/o female with Hx of constipation, DM2, GI bleed, high cholesterol, HTN, rectal bleeding, and stroke with global aphasia.  Pt is nonverbal.  Admitted for abdominal pain and vomiting.  Presented with impaction.  Low Braden of 12.   Unable to obtain Hx from pt as she is nonverbal.  RN said appetite before coming to the hospital was poor to average. Son says she ate well, but didn't drink enough fluids  Unable to obtain UBW from pt, but medical Hx indicates around 206-214 lbs before stroke.  CBW of 169 lbs indicates a 21% decrease in wt over 7.5 months which is clinically significant for severe malnutrition.   Despite wt loss, however, the nutrition focused physical exam showed only mild fat and muscle depletion, mainly in temples, clavicle, and some in legs.   Given questionable hx of intake, will supplement diet with Ensure BID, monitor PO intake and follow up as warranted.   Labs reviewed:  Glucose 202, A1C was 5.4, BUN 32, creatinine 1.76, total protein 5.8, albumin 2.0  Recent Labs  Lab 10/13/17 0104 10/13/17 0654  NA 146* 146*  K 4.2 3.9  CL 103 104  CO2 28 30  BUN 29* 32*  CREATININE 1.70* 1.76*  CALCIUM 8.3* 7.6*  GLUCOSE 260* 202*   Medications:  Lipitor, urecholine, plavix, insulin, flomax, 0.45 saline, cipro  Past Medical History:  Diagnosis Date  . Colon polyp   . Constipation   . Diabetes mellitus without  complication ([place])   . GI bleed   . Hemorrhoids   . High cholesterol   . Hypertension   . PAD (peripheral artery disease) ([place])   . Peripheral neuropathy   . Rectal bleeding    "for 7 years"  . Rectal bleeding   . Stroke ([place])    NUTRITION - FOCUSED PHYSICAL EXAM:    Most Recent Value  Orbital Region  No depletion  Upper Arm Region  No depletion  Temple Region  Mild depletion  Clavicle Bone Region  Mild depletion  Clavicle and Acromion Bone Region  No depletion  Scapular Bone Region  No depletion  Dorsal Hand  No depletion  Patellar Region  No depletion  Anterior Thigh Region  Mild depletion  Posterior Calf Region  Mild depletion  Edema (RD Assessment)  None  Hair  Reviewed  Skin  Reviewed  Nails  Reviewed     Diet Order:   Diet Order           DIET DYS 2 Room service appropriate? Yes; Fluid consistency: Thin  Diet effective now         EDUCATION NEEDS:   No education needs have been identified at this time  Skin:  Skin Assessment: Reviewed RN Assessment  Last BM:  5/30  Height:   Ht Readings from Last 1 Encounters:  06/20/17 5\' 5"  (1.651 m)   Weight:   Wt Readings from Last 1 Encounters:  10/13/17  169 lb 8.5 oz (76.9 kg)   Ideal Body Weight: 56.82 kg   BMI:  Body mass index is 28.21 kg/m.  Estimated Nutritional Needs:   Kcal:  1540-1920 kcal (20-25 kcal/kg BW)  Protein:  77-92 g (1-1.2 g/kg BW)  Fluid:  >/=1920 mL (>/= 25 mL/kg BW)

## 2017-10-13 NOTE — Clinical Social Work Note (Addendum)
Pt is a 77 year old female admitted from [facility]. Pt had a stroke several months ago and was at [facility] for rehab and then went to [facility]. Pt's family asking if referral can be made to [facility] for rehab at dc from the hospital. Spoke with Marianna Fuss at [facility] to update. Will send referral through the hub. Will continue to follow and assist with dc planning.   Spoke with [employer] by phone to follow up on family's concerns about delay in getting patient an immobilizer for her leg to keep her from being able to keep it tucked up under her under pressure and also on whether they were working on pt getting an MRI for new upper extremity symptoms. Was informed that they had to order the immobilizer from their central supply and they weren't sure why it was delayed but stated that they followed up on it regularly. Was informed that delay may have been due to the device being on back order. 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   As far as the MRI, was informed that the MD and APNP were monitoring her upper extremity symptoms and had been considering an MRI but were trying some medication changes first to see if that improved pt's condition. Will update medical team and family on above.

## 2017-10-13 NOTE — Progress Notes (Signed)
Pharmacy Renal Dosing Adjustment  Drug: Lovenox Original dose: Lovenox 40mg   daily SCr: 1.76 mg/dL CrClest~ 75mL/min New dose: Lovenox 30mg  daily  Thank You, Despina Pole, [facility]

## 2017-10-13 NOTE — Progress Notes (Signed)
CRITICAL VALUE ALERT  Critical Value:  Troponin 0.04  Date & Time Notied:  10/13/17 0749  Provider Notified: Wynetta Emery   Orders Received/Actions taken:

## 2017-10-13 NOTE — H&P (Signed)
TRH H&P    Patient Demographics:    Marianela Mandrell, is a 77 y.o. female  MRN: [id number]  DOB - 12-22-1940  Admit Date - 10/12/2017  Referring MD/NP/PA: Veryl Speak  Outpatient Primary MD for the patient is Caren Macadam, MD  Patient coming from: Skilled facility  Chief complaint-abdominal pain   HPI:    Sunjai Levandoski  is a 77 y.o. female,with a history of prior CVA with global aphasia, diabetes mellitus, hypertension, peripheral artery disease was sent from skilled care facility for evaluation of vomiting.  There was concern for small bowel obstruction.  In the ED CT scan of the abdomen and pelvis showed sigmoid colitis. Patient started on ciprofloxacin and Flagyl. Patient  has global aphasia, nonverbal, unable to provide any history.    Review of systems:     Not obtainable, as patient is nonverbal due to history of prior CVA with global aphasia.   With Past History of the following :    Past Medical History:  Diagnosis Date  . Colon polyp   . Constipation   . Diabetes mellitus without complication ([place])   . GI bleed   . Hemorrhoids   . High cholesterol   . Hypertension   . PAD (peripheral artery disease) ([place])   . Peripheral neuropathy   . Rectal bleeding    "for 7 years"  . Rectal bleeding   . Stroke [facility])       Past Surgical History:  Procedure Laterality Date  . ABDOMINAL HYSTERECTOMY    . COLONOSCOPY     X 7  . COLONOSCOPY N/A 03/21/2017   Procedure: COLONOSCOPY;  Surgeon: Danie Binder, MD;  [facility];  Service: Endoscopy;  Laterality: N/A;  1215       Social History:      Social History   Tobacco Use  . Smoking status: Never Smoker  . Smokeless tobacco: Never Used  Substance Use Topics  . Alcohol use: No       Family History :     Family History  Problem Relation Age of Onset  . Diabetes Mother   . Cancer Father   . Diabetes  Sister   . Diabetes Brother   . Diabetes Daughter   . Diabetes Maternal Grandmother   . Diabetes Daughter   . Colon cancer Neg Hx      Home Medications:   Prior to Admission medications   Medication Sig Start Date End Date Taking? Authorizing Provider  acetaminophen (TYLENOL) 325 MG tablet Take 1-2 tablets (325-650 mg total) by mouth every 4 (four) hours as needed for mild pain. 07/14/17   Love, Ivan Anchors, PA-C  amLODipine (NORVASC) 10 MG tablet Take 1 tablet (10 mg total) by mouth daily. 07/14/17   Love, Ivan Anchors, PA-C  aspirin 325 MG EC tablet Take 1 tablet (325 mg total) by mouth daily. 07/14/17   Love, Ivan Anchors, PA-C  atorvastatin (LIPITOR) 40 MG tablet Take 1 tablet (40 mg total) by mouth daily at 6 PM. 06/20/17   Johnson, Clanford L,  MD  bethanechol (URECHOLINE) 25 MG tablet Take 1 tablet (25 mg total) by mouth 3 (three) times daily. 07/14/17   Love, Ivan Anchors, PA-C  citalopram (CELEXA) 10 MG tablet Take 10 mg by mouth daily.    [provider]  clopidogrel (PLAVIX) 75 MG tablet Take 1 tablet (75 mg total) by mouth daily with breakfast. 06/21/17   Johnson, Clanford L, MD  insulin glargine (LANTUS) 100 UNIT/ML injection Inject 0.35 mLs (35 Units total) into the skin daily. 07/14/17   Love, Ivan Anchors, PA-C  Multiple Vitamins-Minerals (ONE-A-DAY 50 PLUS PO) Take 1 tablet by mouth daily.    [provider]  protein supplement shake (PREMIER PROTEIN) LIQD Take 59.1 mLs (2 oz total) by mouth 4 (four) times daily. 07/14/17   Love, Ivan Anchors, PA-C  senna-docusate (SENOKOT-S) 8.6-50 MG tablet Take 2 tablets by mouth at bedtime. 07/14/17   Love, Ivan Anchors, PA-C  tamsulosin (FLOMAX) 0.4 MG CAPS capsule Take 1 capsule (0.4 mg total) by mouth daily after supper. 07/14/17   Love, Ivan Anchors, PA-C  vitamin B-12 (CYANOCOBALAMIN) 1000 MCG tablet Take 1,000 mcg by mouth daily.    [provider]     Allergies:     Allergies  Allergen Reactions  . Hctz [Hydrochlorothiazide] Other (See  Comments)    Caused pancreatitis flare up  . Morphine And Related Other (See Comments)    dizziness  . Valium [Diazepam] Other (See Comments)    Confusion, blurred vision     Physical Exam:   Vitals  Blood pressure (!) 85/62, pulse 67, temperature 99.9 F (37.7 C), temperature source Rectal, resp. rate 16, SpO2 100 %.  1.  General: Lethargic but arousable  2. Psychiatric: Not tested  3. Neurologic: Lethargic, not following commands  4. Eyes :  anicteric sclerae, moist conjunctivae with no lid lag. PERRLA.  5. ENMT:  Oropharynx clear with moist mucous membranes and good dentition  6. Neck:  supple, no cervical lymphadenopathy appriciated, No thyromegaly  7. Respiratory : Normal respiratory effort, good air movement bilaterally,clear to  auscultation bilaterally  8. Cardiovascular : RRR, no gallops, rubs or murmurs, no leg edema  9. Gastrointestinal:  Positive bowel sounds, abdomen soft, non-tender to palpation,no hepatosplenomegaly, no rigidity or guarding       10. Skin:  No cyanosis, normal texture and turgor, no rash, lesions or ulcers  11.Musculoskeletal:  Good muscle tone,  joints appear normal , no effusions,  normal range of motion    Data Review:    CBC Recent Labs  Lab 10/13/17 0104  WBC 17.5*  HGB 12.2  HCT 37.8  PLT 398  MCV 82.9  MCH 26.8  MCHC 32.3  RDW 16.7*  LYMPHSABS 1.0  MONOABS 1.1*  EOSABS 0.0  BASOSABS 0.0   ------------------------------------------------------------------------------------------------------------------  Chemistries  Recent Labs  Lab 10/13/17 0104  NA 146*  K 4.2  CL 103  CO2 28  GLUCOSE 260*  BUN 29*  CREATININE 1.70*  CALCIUM 8.3*  AST 41  ALT 18  ALKPHOS 103  BILITOT 0.9    ------------------------------------------------------------------------------------------------------------------  ------------------------------------------------------------------------------------------------------------------ GFR: CrCl cannot be calculated (Unknown ideal weight.). Liver Function Tests: Recent Labs  Lab 10/13/17 0104  AST 41  ALT 18  ALKPHOS 103  BILITOT 0.9  PROT 6.5  ALBUMIN 2.4*   Recent Labs  Lab 10/13/17 0104  LIPASE 37   No results for input(s): AMMONIA in the last 168 hours. Coagulation Profile: No results for input(s): INR, PROTIME in the last 168 hours.  Cardiac Enzymes: Recent Labs  Lab 10/13/17 0104  TROPONINI 0.03*   BNP (last 3 results) No results for input(s): PROBNP in the last 8760 hours. HbA1C: No results for input(s): HGBA1C in the last 72 hours. CBG: Recent Labs  Lab 10/13/17 0522  GLUCAP 183*   Lipid Profile: No results for input(s): CHOL, HDL, LDLCALC, TRIG, CHOLHDL, LDLDIRECT in the last 72 hours. Thyroid Function Tests: No results for input(s): TSH, T4TOTAL, FREET4, T3FREE, THYROIDAB in the last 72 hours. Anemia Panel: No results for input(s): VITAMINB12, FOLATE, FERRITIN, TIBC, IRON, RETICCTPCT in the last 72 hours.  --------------------------------------------------------------------------------------------------------------- Urine analysis:    Component Value Date/Time   COLORURINE YELLOW 06/27/2017 1045   APPEARANCEUR HAZY (A) 06/27/2017 1045   LABSPEC 1.018 06/27/2017 1045   PHURINE 5.0 06/27/2017 1045   GLUCOSEU NEGATIVE 06/27/2017 1045   HGBUR NEGATIVE 06/27/2017 [place] 06/27/2017 [place] 06/27/2017 1045   PROTEINUR NEGATIVE 06/27/2017 1045   NITRITE NEGATIVE 06/27/2017 1045   LEUKOCYTESUR SMALL (A) 06/27/2017 1045      Imaging Results:    Ct Abdomen Pelvis W Contrast  Result Date: 10/13/2017 CLINICAL DATA:  Vomiting EXAM: CT ABDOMEN AND PELVIS WITH  CONTRAST TECHNIQUE: Multidetector CT imaging of the abdomen and pelvis was performed using the standard protocol following bolus administration of intravenous contrast. CONTRAST:  12mL ISOVUE-300 IOPAMIDOL (ISOVUE-300) INJECTION 61% COMPARISON:  05/29/2016 FINDINGS: Lower chest: Lung bases are essentially clear. Hepatobiliary: Liver is notable for suspected focal steatosis superior to the gallbladder fossa (coronal image 38). Layering gallbladder sludge (series 2/image 19). No inflammatory changes. No intrahepatic or extrahepatic ductal dilatation. Pancreas: Within normal limits. Spleen: Within normal limits. Adrenals/Urinary Tract: Adrenal glands are within normal limits. Right lower pole renal cortical scarring. 4 mm nonobstructing right upper pole renal calculus (series 2/image 30). Parenchymal calcification in the right lower kidney. Left renal cysts, measuring up to 5.6 cm in the posterior left upper kidney. No hydronephrosis. Bladder is within normal limits. Stomach/Bowel: Stomach is within normal limits. No evidence of bowel obstruction. Appendix is not discretely visualized. Moderate rectal stool burden. Mild wall thickening involving the sigmoid colon with mild pericolonic inflammatory changes (series 2/image 68), suggesting colitis. No pneumatosis or free air. Vascular/Lymphatic: No evidence of abdominal aortic aneurysm. Atherosclerotic calcifications of the abdominal aorta and branch vessels. No suspicious abdominopelvic lymphadenopathy. Reproductive: Status post hysterectomy. No adnexal masses. Other: No abdominopelvic ascites. Musculoskeletal: Degenerative changes of the visualized thoracolumbar spine. IMPRESSION: Wall thickening/inflammatory changes involving the sigmoid colon, suggesting colitis. No pneumatosis or free air. Moderate rectal stool burden, suggesting fecal impaction. No evidence of bowel obstruction. Additional ancillary findings as above. Electronically Signed   By: Julian Hy  M.D.   On: 10/13/2017 02:36    My personal review of EKG: Rhythm NSR   Assessment & Plan:    Active Problems:   Colitis   1. Colitis-start ciprofloxacin and Flagyl.  Continue IV normal saline at 125 mL/h.  We will keep her n.p.o. 2. Diabetes mellitus-start sliding scale insulin with NovoLog, check CBG every 6 hours 3. History of CVA with global aphasia-at baseline.  Continue Plavix, aspirin, Lipitor   DVT Prophylaxis-   Lovenox   AM Labs Ordered, also please review Full Orders  Family Communication: No family at bedside  Code Status: Full code  Admission status: Observation  Time spent in minutes : 60 minutes   Oswald Hillock M.D on 10/13/2017 at 5:42 AM  Between 7am to 7pm - Pager -  [phone]. After 7pm go to [email or web address] - password [facility]  [facility] - Office  [phone]

## 2017-10-13 NOTE — Evaluation (Signed)
Clinical/Bedside Swallow Evaluation Patient Details  Name: Sandra [name] MRN: [id number] Date of Birth: [birth year]  Today's Date: 10/13/2017 Time: SLP Start Time (ACUTE ONLY): 1032 SLP Stop Time (ACUTE ONLY): 1100 SLP Time Calculation (min) (ACUTE ONLY): 28 min  Past Medical History:  Past Medical History:  Diagnosis Date  . Colon polyp   . Constipation   . Diabetes mellitus without complication ([place])   . GI bleed   . Hemorrhoids   . High cholesterol   . Hypertension   . PAD (peripheral artery disease) ([place])   . Peripheral neuropathy   . Rectal bleeding    "for 7 years"  . Rectal bleeding   . Stroke [facility])    Past Surgical History:  Past Surgical History:  Procedure Laterality Date  . ABDOMINAL HYSTERECTOMY    . COLONOSCOPY     X 7  . COLONOSCOPY N/A 03/21/2017   Procedure: COLONOSCOPY;  Surgeon: Danie Binder, MD;  [facility];  Service: Endoscopy;  Laterality: N/A;  1215    HPI:  This patient is a 77 year old female with history of prior CVA with global aphasia, diabetes, hypertension, peripheral artery disease.  She was sent from her extended care facility today for evaluation of vomiting.  I am told she has vomited 6 or 7 times this afternoon.  The care providers felt as though she was having abdominal pain and were concerned about a bowel obstruction.  Patient adds very little history secondary to prior CVA/aphasia.   Assessment / Plan / Recommendation Clinical Impression  Clinical swallow evaluation completed at bedside. Pt known to this SLP from previous admission in February following acute stroke. Since that time, she has been at [facility] for SNF and consuming a mechanical soft diet with thin liquids. Pt made little attempt to follow directions related to oral motor examination, but did respond spontaneously when ice chips on spoon presented to oral cavity. Pt consumed ice chips, thin water via cup/straw, ice cream, and graham crackers without overt signs  of aspiration, however overall intake was limited. Pt vomited several times yesterday and is reportedly impacted. Recommend D2 with thin liquids when cleared by MD for po intake. MRI brain is pending. SLP will follow during acute stay for diet tolerance and upgrades as appropriate. Above to RN.    SLP Visit Diagnosis: Dysphagia, unspecified (R13.10)    Aspiration Risk  Mild aspiration risk    Diet Recommendation Dysphagia 2 (Fine chop);Thin liquid   Liquid Administration via: Cup;Straw Medication Administration: Whole meds with liquid Supervision: Patient able to self feed;Staff to assist with self feeding;Intermittent supervision to cue for compensatory strategies Compensations: Minimize environmental distractions;Slow rate;Small sips/bites;Lingual sweep for clearance of pocketing Postural Changes: Seated upright at 90 degrees;Remain upright for at least 30 minutes after po intake    Other  Recommendations Oral Care Recommendations: Oral care before and after PO;Staff/trained caregiver to provide oral care Other Recommendations: Clarify dietary restrictions   Follow up Recommendations Skilled Nursing facility      Frequency and Duration min 2x/week  1 week       Prognosis Prognosis for Safe Diet Advancement: Fair Barriers to Reach Goals: Language deficits;Cognitive deficits      Swallow Study   General Date of Onset: 10/12/17 HPI: This patient is a 77 year old female with history of prior CVA with global aphasia, diabetes, hypertension, peripheral artery disease.  She was sent from her extended care facility today for evaluation of vomiting.  I am told she has vomited 6 or  7 times this afternoon.  The care providers felt as though she was having abdominal pain and were concerned about a bowel obstruction.  Patient adds very little history secondary to prior CVA/aphasia. Type of Study: Bedside Swallow Evaluation Previous Swallow Assessment: February MBSS D1/thin, was on soft/thin  at [facility] Diet Prior to this Study: NPO Temperature Spikes Noted: No Respiratory Status: Nasal cannula(changed from [facility] to nasal cannula) History of Recent Intubation: No Behavior/Cognition: Alert;Requires cueing;Doesn't follow directions Oral Cavity Assessment: (Difficult to asssess due to reduced cognition) Oral Care Completed by SLP: Recent completion by staff Oral Cavity - Dentition: (missing some dentition) Vision: Functional for self-feeding Self-Feeding Abilities: Needs assist Patient Positioning: Upright in bed Baseline Vocal Quality: Not observed(some occasional muttering heard) Volitional Cough: Cognitively unable to elicit Volitional Swallow: Unable to elicit    Oral/Motor/Sensory Function Overall Oral Motor/Sensory Function: Moderate impairment Facial ROM: Reduced right Facial Symmetry: Abnormal symmetry right Facial Strength: Reduced right   Ice Chips Ice chips: Within functional limits Presentation: Spoon   Thin Liquid Thin Liquid: Within functional limits Presentation: Cup;Straw Other Comments: (mild labial spillage with cup sip)    Nectar Thick Nectar Thick Liquid: Not tested   Honey Thick Honey Thick Liquid: Not tested   Puree Puree: Within functional limits   Solid   Thank you,  Genene Churn, CCC-SLP [phone]  Solid: Within functional limits Presentation: Spoon Other Comments: SLP broke pieces of graham cracker        Sandra [name] 10/13/2017,11:42 AM

## 2017-10-13 NOTE — ED Notes (Signed)
CRITICAL VALUE ALERT  Critical Value: Troponin 0.03  Date & Time Notied:  10/13/17 0151  Provider Notified: EDP [place]   Orders Received/Actions taken: No orders at this time

## 2017-10-13 NOTE — Procedures (Signed)
[place] A. Merlene Laughter, MD     [email or web address]           HISTORY: The patient is a 77 year old presents with recurrent shaking of the right upper extremity and altered mental status. This study is being done to evaluate for epileptic seizures as the etiology.  MEDICATIONS: Scheduled Meds: . atorvastatin  40 mg Oral q1800  . bethanechol  25 mg Oral TID  . citalopram  10 mg Oral Daily  . clopidogrel  75 mg Oral Q breakfast  . enoxaparin (LOVENOX) injection  30 mg Subcutaneous Q24H  . feeding supplement (ENSURE ENLIVE)  237 mL Oral BID BM  . insulin aspart  0-9 Units Subcutaneous TID WC  . senna-docusate  2 tablet Oral QHS  . sorbitol, milk of mag, mineral oil, glycerin (SMOG) enema  960 mL Rectal Once  . tamsulosin  0.4 mg Oral QPC supper   Continuous Infusions: . sodium chloride 140 mL/hr at 10/13/17 0912  . ciprofloxacin    . metronidazole     PRN Meds:.acetaminophen, ondansetron **OR** ondansetron (ZOFRAN) IV  Prior to Admission medications   Medication Sig Start Date End Date Taking? Authorizing Provider  amLODipine (NORVASC) 10 MG tablet Take 1 tablet (10 mg total) by mouth daily. 07/14/17  Yes Love, Ivan Anchors, PA-C  aspirin 325 MG EC tablet Take 1 tablet (325 mg total) by mouth daily. 07/14/17  Yes Love, Ivan Anchors, PA-C  atorvastatin (LIPITOR) 40 MG tablet Take 1 tablet (40 mg total) by mouth daily at 6 PM. 06/20/17  Yes Johnson, Clanford L, MD  bethanechol (URECHOLINE) 25 MG tablet Take 1 tablet (25 mg total) by mouth 3 (three) times daily. 07/14/17  Yes Love, Ivan Anchors, PA-C  bisacodyl (DULCOLAX) 10 MG suppository Place 10 mg rectally once as needed for moderate constipation.   Yes [provider]  citalopram (CELEXA) 10 MG tablet Take 10 mg by mouth daily.   Yes [provider]  clopidogrel (PLAVIX) 75 MG tablet Take 1 tablet (75 mg total) by mouth daily with breakfast. 06/21/17  Yes Johnson, Clanford L, MD  collagenase (SANTYL) ointment  Apply 1 application topically daily.   Yes [provider]  gabapentin (NEURONTIN) 100 MG capsule Take 100 mg by mouth 3 (three) times daily.   Yes [provider]  insulin glargine (LANTUS) 100 UNIT/ML injection Inject 0.35 mLs (35 Units total) into the skin daily. Patient taking differently: Inject 10 Units into the skin daily.  07/14/17  Yes Love, Ivan Anchors, PA-C  metFORMIN (GLUCOPHAGE) 500 MG tablet Take by mouth 3 (three) times daily.   Yes [provider]  Multiple Vitamins-Minerals (ONE-A-DAY 50 PLUS PO) Take 1 tablet by mouth daily.   Yes [provider]  senna-docusate (SENOKOT-S) 8.6-50 MG tablet Take 2 tablets by mouth at bedtime. Patient taking differently: Take 2 tablets by mouth daily.  07/14/17  Yes Love, Ivan Anchors, PA-C  tamsulosin (FLOMAX) 0.4 MG CAPS capsule Take 1 capsule (0.4 mg total) by mouth daily after supper. 07/14/17  Yes Love, Ivan Anchors, PA-C  traMADol (ULTRAM) 50 MG tablet Take 50 mg by mouth every 6 (six) hours as needed.   Yes [provider]  vitamin B-12 (CYANOCOBALAMIN) 1000 MCG tablet Take 1,000 mcg by mouth daily.   Yes [provider]  acetaminophen (TYLENOL) 325 MG tablet Take 1-2 tablets (325-650 mg total) by mouth every 4 (four) hours as needed for mild pain. 07/14/17   Bary Leriche, PA-C  ANALYSIS: A 16 channel recording using standard 10 20 measurements is conducted for 29 minutes. The prominent background activity is that of a 9 Hz alpha activity but there is also high frequency low amplitude beta activity of approximately 20 Hz. The study is significantly degraded by excessive myogenic artifact. There is episodes of frontal intermittent rhythmic delta activity along with triphasic waves. There is no focal or lateralized slowing. There is no clear epileptiform discharges observed.   IMPRESSION: 1. Frontal intermittent rhythmic delta activities are observed. This is typically seen in toxic metabolic  encephalopathies. 2. Triphasic waves are also observed typically seen in metabolic encephalopathies particularly due to hepatic failure or renal failure.      Jp [place] A. Merlene Laughter, M.D.  Diplomate, Tax adviser of Psychiatry and Neurology ( Neurology).

## 2017-10-14 LAB — GLUCOSE, CAPILLARY
GLUCOSE-CAPILLARY: 187 mg/dL — AB (ref 65–99)
Glucose-Capillary: 288 mg/dL — ABNORMAL HIGH (ref 65–99)
Glucose-Capillary: 173 mg/dL — ABNORMAL HIGH (ref 65–99)
Glucose-Capillary: 115 mg/dL — ABNORMAL HIGH (ref 65–99)

## 2017-10-14 LAB — URINALYSIS, ROUTINE W REFLEX MICROSCOPIC
GLUCOSE, UA: NEGATIVE mg/dL
Hgb urine dipstick: NEGATIVE
Ketones, ur: NEGATIVE mg/dL
NITRITE: NEGATIVE
PROTEIN: NEGATIVE mg/dL
SPECIFIC GRAVITY, URINE: 1.041 — AB (ref 1.005–1.030)
WBC, UA: >50 WBC/hpf — ABNORMAL HIGH (ref 0–5)
pH: 5 (ref 5.0–8.0)

## 2017-10-14 LAB — COMPREHENSIVE METABOLIC PANEL
ALBUMIN: 1.8 g/dL — AB (ref 3.5–5.0)
ALK PHOS: 84 U/L (ref 38–126)
ALT: 14 U/L (ref 14–54)
AST: 43 U/L — AB (ref 15–41)
Anion gap: 12 (ref 5–15)
BILIRUBIN TOTAL: 1 mg/dL (ref 0.3–1.2)
BUN: 42 mg/dL — AB (ref 6–20)
CALCIUM: 7.3 mg/dL — AB (ref 8.9–10.3)
CO2: 23 mmol/L (ref 22–32)
CREATININE: 2.37 mg/dL — AB (ref 0.44–1.00)
Chloride: 110 mmol/L (ref 101–111)
GFR calc Af Amer: 21 mL/min — ABNORMAL LOW (ref >60)
GFR, EST NON AFRICAN AMERICAN: 19 mL/min — AB (ref >60)
GLUCOSE: 233 mg/dL — AB (ref 65–99)
Potassium: 4.6 mmol/L (ref 3.5–5.1)
Sodium: 145 mmol/L (ref 135–145)
TOTAL PROTEIN: 5.4 g/dL — AB (ref 6.5–8.1)

## 2017-10-14 LAB — CBC
HEMATOCRIT: 33.1 % — AB (ref 36.0–46.0)
HEMOGLOBIN: 10.4 g/dL — AB (ref 12.0–15.0)
MCH: 26.7 pg (ref 26.0–34.0)
MCHC: 31.4 g/dL (ref 30.0–36.0)
MCV: 85.1 fL (ref 78.0–100.0)
Platelets: 312 10*3/uL (ref 150–400)
RBC: 3.89 MIL/uL (ref 3.87–5.11)
RDW: 17.4 % — ABNORMAL HIGH (ref 11.5–15.5)
WBC: 13.3 10*3/uL — AB (ref 4.0–10.5)

## 2017-10-14 LAB — MAGNESIUM: Magnesium: 2.4 mg/dL (ref 1.7–2.4)

## 2017-10-14 MED ORDER — INSULIN GLARGINE 100 UNIT/ML ~~LOC~~ SOLN
10.0000 [IU] | Freq: Every day | SUBCUTANEOUS | Status: DC
  Administered 2017-10-14 – 2017-10-18 (×5): 10 [IU] via SUBCUTANEOUS
  Filled 2017-10-14 (×7): qty 0.1

## 2017-10-14 MED ORDER — INSULIN GLARGINE 100 UNIT/ML ~~LOC~~ SOLN
10.0000 [IU] | SUBCUTANEOUS | Status: DC
  Filled 2017-10-14: qty 0.1

## 2017-10-14 MED ORDER — ORAL CARE MOUTH RINSE
15.0000 mL | Freq: Two times a day (BID) | OROMUCOSAL | Status: DC
  Administered 2017-10-15 – 2017-10-29 (×28): 15 mL via OROMUCOSAL

## 2017-10-14 NOTE — Progress Notes (Signed)
[place] A. Merlene Laughter, MD     [email or web address]          Sandra [name] is an 78 y.o. female.   ASSESSMENT/PLAN:  1.  Altered mental status: This is most likely multifactorial including baseline severe global aphasia and acute medical illness.  Particularly today, she has elevated creatinine and initiation of Keppra may also have contributed to the encephalopathy.    2.  Previous large left MCA infarct: Risk factors diabetes, hypertension and age.  Continue with aspirin 325.  3.  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   Patient is a lot more drowsy today.  Nursing staff reports that the daughter-in-law was able to arouse the patient and feed her breakfast earlier today.       GENERAL: She is resting well in bed and in no acute distress.  HEENT: Neck is supple and without evidence of trauma.  ABDOMEN: soft  EXTREMITIES: No edema   BACK: Normal  SKIN: Normal by inspection.    MENTAL STATUS: She lies in bed with eyes closed.  She opens her eyes to sternal rub.  Again, she is mute.  CRANIAL NERVES: Pupils are equal, round and reactive to light and accomodation; extra ocular movements are full, there is no significant nystagmus; visual fields -reveals a dense right homonymous hemianopia; upper and lower facial muscles are normal in strength and symmetric, there is no flattening of the nasolabial folds; tongue is midline; uvula is midline; shoulder elevation is normal.  MOTOR: She moves the right side much less than the left side.  She has at least antigravity strength on the left side.  Bulk and tone seems normal throughout.  COORDINATION: She has continuous flexion contraction of the right upper extremity including the elbow and shoulder.  REFLEXES: Deep tendon reflexes are symmetrical and normal. Plantar reflexes are flexor bilaterally.   SENSATION: She  responds to pain bilaterally but less on the right side.        NEURO NOTE 06-16-2017 1. Subacute left hemispheric stroke: Risk factors hypertension, diabetes, age, peripheral vascular disease and dyslipidemia. I suspect that the etiology is from intracranial thromboembolic phenomena but the MRA is significantly degraded by motion artifact. Consequent, repeat imaging with a head CTA will be obtained.I recommend dual antiplatelet agents aspirin/Plavix for 3 months. Subsequently, single agent can be used. I think we should increase the patient's statin given her LDL 150.     Patient is an 77 year old white female who presents with that two week history of apparent dysarthria. She apparently has not been taking her baseline medications for the past three weeks. She also appears to have had some language impairment possibly based upon the evaluation by the admitting physician and the emergency room physician. The language impairment seems mostly difficulty understanding over the last couple of days. The review of systems is not obtainable given the patient'pairment.         Blood pressure (!) 94/57, pulse 81, temperature (!) 97.5 F (36.4 C), temperature source Axillary, resp. rate 13, weight 169 lb 8.5 oz (76.9 kg), SpO2 100 %.  Past Medical History:  Diagnosis Date  . Colon polyp   . Constipation   . Diabetes mellitus without complication ([place])   . GI bleed   . Hemorrhoids   . High cholesterol   . Hypertension   . PAD (peripheral artery disease) ([place])   . Peripheral neuropathy   . Rectal bleeding    "  for 7 years"  . Rectal bleeding   . Stroke [facility])     Past Surgical History:  Procedure Laterality Date  . ABDOMINAL HYSTERECTOMY    . COLONOSCOPY     X 7  . COLONOSCOPY N/A 03/21/2017   Procedure: COLONOSCOPY;  Surgeon: Danie Binder, MD;  Location: [facility];  Service: Endoscopy;  Laterality: N/A;  1215     Family History  Problem Relation Age  of Onset  . Diabetes Mother   . Cancer Father   . Diabetes Sister   . Diabetes Brother   . Diabetes Daughter   . Diabetes Maternal Grandmother   . Diabetes Daughter   . Colon cancer Neg Hx     Social History:  reports that she has never smoked. She has never used smokeless tobacco. She reports that she does not drink alcohol or use drugs.  Allergies:  Allergies  Allergen Reactions  . Hctz [Hydrochlorothiazide] Other (See Comments)    Caused pancreatitis flare up  . Morphine And Related Other (See Comments)    dizziness  . Valium [Diazepam] Other (See Comments)    Confusion, blurred vision    Medications: Prior to Admission medications   Medication Sig Start Date End Date Taking? Authorizing Provider  amLODipine (NORVASC) 10 MG tablet Take 1 tablet (10 mg total) by mouth daily. 07/14/17  Yes Love, Ivan Anchors, PA-C  aspirin 325 MG EC tablet Take 1 tablet (325 mg total) by mouth daily. 07/14/17  Yes Love, Ivan Anchors, PA-C  atorvastatin (LIPITOR) 40 MG tablet Take 1 tablet (40 mg total) by mouth daily at 6 PM. 06/20/17  Yes Johnson, Clanford L, MD  bethanechol (URECHOLINE) 25 MG tablet Take 1 tablet (25 mg total) by mouth 3 (three) times daily. 07/14/17  Yes Love, Ivan Anchors, PA-C  bisacodyl (DULCOLAX) 10 MG suppository Place 10 mg rectally once as needed for moderate constipation.   Yes [provider]  citalopram (CELEXA) 10 MG tablet Take 10 mg by mouth daily.   Yes [provider]  clopidogrel (PLAVIX) 75 MG tablet Take 1 tablet (75 mg total) by mouth daily with breakfast. 06/21/17  Yes Johnson, Clanford L, MD  collagenase (SANTYL) ointment Apply 1 application topically daily.   Yes [provider]  gabapentin (NEURONTIN) 100 MG capsule Take 100 mg by mouth 3 (three) times daily.   Yes [provider]  insulin glargine (LANTUS) 100 UNIT/ML injection Inject 0.35 mLs (35 Units total) into the skin daily. Patient taking differently: Inject 10 Units into the  skin daily.  07/14/17  Yes Love, Ivan Anchors, PA-C  metFORMIN (GLUCOPHAGE) 500 MG tablet Take by mouth 3 (three) times daily.   Yes [provider]  Multiple Vitamins-Minerals (ONE-A-DAY 50 PLUS PO) Take 1 tablet by mouth daily.   Yes [provider]  senna-docusate (SENOKOT-S) 8.6-50 MG tablet Take 2 tablets by mouth at bedtime. Patient taking differently: Take 2 tablets by mouth daily.  07/14/17  Yes Love, Ivan Anchors, PA-C  tamsulosin (FLOMAX) 0.4 MG CAPS capsule Take 1 capsule (0.4 mg total) by mouth daily after supper. 07/14/17  Yes Love, Ivan Anchors, PA-C  traMADol (ULTRAM) 50 MG tablet Take 50 mg by mouth every 6 (six) hours as needed.   Yes [provider]  vitamin B-12 (CYANOCOBALAMIN) 1000 MCG tablet Take 1,000 mcg by mouth daily.   Yes [provider]  acetaminophen (TYLENOL) 325 MG tablet Take 1-2 tablets (325-650 mg total) by mouth every 4 (four) hours as  needed for mild pain. 07/14/17   Bary Leriche, PA-C    Scheduled Meds: . atorvastatin  40 mg Oral q1800  . bethanechol  25 mg Oral TID  . citalopram  10 mg Oral Daily  . clopidogrel  75 mg Oral Q breakfast  . enoxaparin (LOVENOX) injection  30 mg Subcutaneous Q24H  . feeding supplement (ENSURE ENLIVE)  237 mL Oral BID BM  . insulin aspart  0-9 Units Subcutaneous TID WC  . insulin glargine  10 Units Subcutaneous Daily  . senna-docusate  2 tablet Oral QHS  . tamsulosin  0.4 mg Oral QPC supper   Continuous Infusions: . sodium chloride 140 mL/hr at 10/14/17 1522  . ciprofloxacin 400 mg (10/14/17 1734)  . levETIRAcetam Stopped (10/14/17 0748)  . metronidazole Stopped (10/14/17 1357)   PRN Meds:.acetaminophen, ondansetron **OR** ondansetron (ZOFRAN) IV     Results for orders placed or performed during the hospital encounter of 10/12/17 (from the past 48 hour(s))  Comprehensive metabolic panel     Status: Abnormal   Collection Time: 10/13/17  1:04 AM  Result Value Ref Range   Sodium 146 (H) 135 -  145 mmol/L   Potassium 4.2 3.5 - 5.1 mmol/L   Chloride 103 101 - 111 mmol/L   CO2 28 22 - 32 mmol/L   Glucose, Bld 260 (H) 65 - 99 mg/dL   BUN 29 (H) 6 - 20 mg/dL   Creatinine, Ser 1.70 (H) 0.44 - 1.00 mg/dL   Calcium 8.3 (L) 8.9 - 10.3 mg/dL   Total Protein 6.5 6.5 - 8.1 g/dL   Albumin 2.4 (L) 3.5 - 5.0 g/dL   AST 41 15 - 41 U/L   ALT 18 14 - 54 U/L   Alkaline Phosphatase 103 38 - 126 U/L   Total Bilirubin 0.9 0.3 - 1.2 mg/dL   GFR calc non Af Amer 28 (L) >60 mL/min   GFR calc Af Amer 32 (L) >60 mL/min    Comment: (NOTE) The eGFR has been calculated using the CKD EPI equation. This calculation has not been validated in all clinical situations. eGFR's persistently <60 mL/min signify possible Chronic Kidney Disease.    Anion gap 15 5 - 15    Comment: Performed at [facility], [address] [place], [place] [address]  Lipase, blood     Status: None   Collection Time: 10/13/17  1:04 AM  Result Value Ref Range   Lipase 37 11 - 51 U/L    Comment: Performed at [facility], [address], [place], [place] [address]  CBC with Differential     Status: Abnormal   Collection Time: 10/13/17  1:04 AM  Result Value Ref Range   WBC 17.5 (H) 4.0 - 10.5 K/uL   RBC 4.56 3.87 - 5.11 MIL/uL   Hemoglobin 12.2 12.0 - 15.0 g/dL   HCT 37.8 36.0 - 46.0 %   MCV 82.9 78.0 - 100.0 fL   MCH 26.8 26.0 - 34.0 pg   MCHC 32.3 30.0 - 36.0 g/dL   RDW 16.7 (H) 11.5 - 15.5 %   Platelets 398 150 - 400 K/uL   Neutrophils Relative % 88 %   Neutro Abs 15.5 (H) 1.7 - 7.7 K/uL   Lymphocytes Relative 6 %   Lymphs Abs 1.0 0.7 - 4.0 K/uL   Monocytes Relative 6 %   Monocytes Absolute 1.1 (H) 0.1 - 1.0 K/uL   Eosinophils Relative 0 %   Eosinophils Absolute 0.0 0.0 - 0.7 K/uL  Basophils Relative 0 %   Basophils Absolute 0.0 0.0 - 0.1 K/uL    Comment: Performed at [facility], [address] [place], [place] [address]  Troponin I     Status: Abnormal   Collection Time: 10/13/17  1:04 AM  Result Value Ref  Range   Troponin I 0.03 (HH) <0.03 ng/mL    Comment: CRITICAL RESULT CALLED TO, READ BACK BY AND VERIFIED WITH: BELTON,B @ 0152 ON 5.30.19 BY BOWMAN,L Performed at [facility], [address] [place], [place] [address]   Glucose, capillary     Status: Abnormal   Collection Time: 10/13/17  5:22 AM  Result Value Ref Range   Glucose-Capillary 183 (H) 65 - 99 mg/dL   Comment 1 Notify RN    Comment 2 Document in Chart   MRSA PCR Screening     Status: None   Collection Time: 10/13/17  6:06 AM  Result Value Ref Range   MRSA by PCR NEGATIVE NEGATIVE    Comment:        The GeneXpert MRSA Assay (FDA approved for NASAL specimens only), is one component of a comprehensive MRSA colonization surveillance program. It is not intended to diagnose MRSA infection nor to guide or monitor treatment for MRSA infections. Performed at [facility], [address], [place], [place] [address]   Hemoglobin A1c     Status: None   Collection Time: 10/13/17  6:54 AM  Result Value Ref Range   Hgb A1c MFr Bld 5.4 4.8 - 5.6 %    Comment: (NOTE) Pre diabetes:          5.7%-6.4% Diabetes:              >6.4% Glycemic control for   <7.0% adults with diabetes    Mean Plasma Glucose 108.28 mg/dL    Comment: Performed at [facility] [address] [place], [place] [address]  CBC     Status: Abnormal   Collection Time: 10/13/17  6:54 AM  Result Value Ref Range   WBC 14.6 (H) 4.0 - 10.5 K/uL   RBC 4.14 3.87 - 5.11 MIL/uL   Hemoglobin 11.2 (L) 12.0 - 15.0 g/dL   HCT 34.4 (L) 36.0 - 46.0 %   MCV 83.1 78.0 - 100.0 fL   MCH 27.1 26.0 - 34.0 pg   MCHC 32.6 30.0 - 36.0 g/dL   RDW 16.8 (H) 11.5 - 15.5 %   Platelets 359 150 - 400 K/uL    Comment: Performed at [facility], [address], [place], [place] [address]  Comprehensive metabolic panel     Status: Abnormal   Collection Time: 10/13/17  6:54 AM  Result Value Ref Range   Sodium 146 (H) 135 - 145 mmol/L   Potassium 3.9 3.5 - 5.1 mmol/L    Chloride 104 101 - 111 mmol/L   CO2 30 22 - 32 mmol/L   Glucose, Bld 202 (H) 65 - 99 mg/dL   BUN 32 (H) 6 - 20 mg/dL   Creatinine, Ser 1.76 (H) 0.44 - 1.00 mg/dL   Calcium 7.6 (L) 8.9 - 10.3 mg/dL   Total Protein 5.8 (L) 6.5 - 8.1 g/dL   Albumin 2.0 (L) 3.5 - 5.0 g/dL   AST 35 15 - 41 U/L   ALT 16 14 - 54 U/L   Alkaline Phosphatase 89 38 - 126 U/L   Total Bilirubin 0.7 0.3 - 1.2 mg/dL   GFR calc non Af Amer 27 (L) >60 mL/min  GFR calc Af Amer 31 (L) >60 mL/min    Comment: (NOTE) The eGFR has been calculated using the CKD EPI equation. This calculation has not been validated in all clinical situations. eGFR's persistently <60 mL/min signify possible Chronic Kidney Disease.    Anion gap 12 5 - 15    Comment: Performed at [facility], [address], [place], [place] [address]  Troponin I (q 6hr x 3)     Status: Abnormal   Collection Time: 10/13/17  6:54 AM  Result Value Ref Range   Troponin I 0.04 (HH) <0.03 ng/mL    Comment: CRITICAL RESULT CALLED TO, READ BACK BY AND VERIFIED WITH: MARTIN,D AT 7:40AM ON 10/13/17 BY Derrill Memo Performed at [facility], [address], [place], [place] [address]   Glucose, capillary     Status: Abnormal   Collection Time: 10/13/17 11:34 AM  Result Value Ref Range   Glucose-Capillary 171 (H) 65 - 99 mg/dL  Troponin I (q 6hr x 3)     Status: Abnormal   Collection Time: 10/13/17  1:39 PM  Result Value Ref Range   Troponin I 0.04 (HH) <0.03 ng/mL    Comment: CRITICAL VALUE NOTED.  VALUE IS CONSISTENT WITH PREVIOUSLY REPORTED AND CALLED VALUE. Performed at [facility], [address], [place], [place] [address]   Glucose, capillary     Status: Abnormal   Collection Time: 10/13/17  4:21 PM  Result Value Ref Range   Glucose-Capillary 187 (H) 65 - 99 mg/dL   Comment 1 Notify RN   Glucose, capillary     Status: Abnormal   Collection Time: 10/13/17  5:46 PM  Result Value Ref Range   Glucose-Capillary 252 (H) 65 - 99 mg/dL   Comment 1 Notify RN    Troponin I (q 6hr x 3)     Status: Abnormal   Collection Time: 10/13/17  6:56 PM  Result Value Ref Range   Troponin I 0.05 (HH) <0.03 ng/mL    Comment: CRITICAL VALUE NOTED.  VALUE IS CONSISTENT WITH PREVIOUSLY REPORTED AND CALLED VALUE. Performed at [facility], [address] [place], [place] [address]   Glucose, capillary     Status: Abnormal   Collection Time: 10/13/17  9:17 PM  Result Value Ref Range   Glucose-Capillary 223 (H) 65 - 99 mg/dL   Comment 1 Notify RN   CBC     Status: Abnormal   Collection Time: 10/14/17  3:42 AM  Result Value Ref Range   WBC 13.3 (H) 4.0 - 10.5 K/uL   RBC 3.89 3.87 - 5.11 MIL/uL   Hemoglobin 10.4 (L) 12.0 - 15.0 g/dL   HCT 33.1 (L) 36.0 - 46.0 %   MCV 85.1 78.0 - 100.0 fL   MCH 26.7 26.0 - 34.0 pg   MCHC 31.4 30.0 - 36.0 g/dL   RDW 17.4 (H) 11.5 - 15.5 %   Platelets 312 150 - 400 K/uL    Comment: Performed at [facility], [address] [place], [place] [address]  Comprehensive metabolic panel     Status: Abnormal   Collection Time: 10/14/17  3:42 AM  Result Value Ref Range   Sodium 145 135 - 145 mmol/L   Potassium 4.6 3.5 - 5.1 mmol/L   Chloride 110 101 - 111 mmol/L   CO2 23 22 - 32 mmol/L   Glucose, Bld 233 (H) 65 - 99 mg/dL   BUN 42 (H) 6 - 20 mg/dL   Creatinine, Ser 2.37 (H) 0.44 - 1.00 mg/dL  Calcium 7.3 (L) 8.9 - 10.3 mg/dL   Total Protein 5.4 (L) 6.5 - 8.1 g/dL   Albumin 1.8 (L) 3.5 - 5.0 g/dL   AST 43 (H) 15 - 41 U/L   ALT 14 14 - 54 U/L   Alkaline Phosphatase 84 38 - 126 U/L   Total Bilirubin 1.0 0.3 - 1.2 mg/dL   GFR calc non Af Amer 19 (L) >60 mL/min   GFR calc Af Amer 21 (L) >60 mL/min    Comment: (NOTE) The eGFR has been calculated using the CKD EPI equation. This calculation has not been validated in all clinical situations. eGFR's persistently <60 mL/min signify possible Chronic Kidney Disease.    Anion gap 12 5 - 15    Comment: Performed at [facility], [address] [place], [place] [address]  Magnesium      Status: None   Collection Time: 10/14/17  3:42 AM  Result Value Ref Range   Magnesium 2.4 1.7 - 2.4 mg/dL    Comment: Performed at [facility], [address], [place], [place] [address]  Glucose, capillary     Status: Abnormal   Collection Time: 10/14/17  7:46 AM  Result Value Ref Range   Glucose-Capillary 288 (H) 65 - 99 mg/dL   Comment 1 Notify RN   Glucose, capillary     Status: Abnormal   Collection Time: 10/14/17 11:19 AM  Result Value Ref Range   Glucose-Capillary 173 (H) 65 - 99 mg/dL   Comment 1 Notify RN     Studies/Results:     Faustino Luecke A. Merlene Laughter, M.D.  Diplomate, Tax adviser of Psychiatry and Neurology ( Neurology). 10/14/2017, 5:54 PM

## 2017-10-14 NOTE — Progress Notes (Signed)
PROGRESS NOTE    Sandra [name]  SWF:[id number] DOB: 08/05/1940 DOA: 10/12/2017 PCP: Caren Macadam, MD    Brief Narrative:  77 year old female admitted to the hospital with abdominal pain. She was found to have colitis with possible fecal impaction on admission. 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 Neurology following. She also has worsening renal function. Nephrology consulted.    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 Colitis.  Currently on ciprofloxacin and Flagyl.  Still has some abdominal tenderness.  Continue current treatments. 2. Acute renal failure.  Creatinine continues to trend up.  She did receive some IV contrast on admission.  Continue on IV fluids.  Nephrology consult.  No hydronephrosis on CT scan done on admission. 3. Partial seizures.  Neurology following.  Started on Keppra. 4. Diabetes.  On sliding scale insulin.  Also on Lantus.  Blood sugar stable.  Continue to monitor. 5. Previous stroke.  She has residual right-sided hemiparesis.  Continue on Plavix.  MRI performed during this hospitalization was poor quality. 6. Possible fecal impaction.  She received enema.  We will follow-up with nursing staff to see if she had any results.   DVT prophylaxis: Lovenox Code Status: Full code Family Communication: No family present Disposition Plan: Return to nursing facility at discharge   Consultants:   Neurology  Procedures:  EEG:1. Frontal intermittent rhythmic delta activities are observed. This is  typically seen in toxic metabolic encephalopathies.  2. Triphasic waves are also observed typically seen in metabolic encephalopathies particularly due to hepatic failure or renal failure.  Antimicrobials:   Ciprofloxacin 5/30 >  Flagyl 5/30>   Subjective: Somnolent, wakes up to voice and answer some questions, denies any complaints  Objective: Vitals:   10/14/17 0829 10/14/17 0900 10/14/17 0910 10/14/17 1000  BP:   124/62 120/60  Pulse:  78  (!) 54  Resp:  17  18  Temp: 99.1 F (37.3 C)     TempSrc: Oral     SpO2:  91%  98%  Weight:        Intake/Output Summary (Last 24 hours) at 10/14/2017 1106 Last data filed at 10/14/2017 0900 Gross per 24 hour  Intake 3616.67 ml  Output -  Net 3616.67 ml   Filed Weights   10/13/17 0618 10/14/17 0500  Weight: 76.9 kg (169 lb 8.5 oz) 76.9 kg (169 lb 8.5 oz)    Examination:  General exam: Appears calm and comfortable  Respiratory system: Clear to auscultation. Respiratory effort normal. Cardiovascular system: S1 & S2 heard, RRR. No JVD, murmurs, rubs, gallops or clicks. No pedal edema. Gastrointestinal system: Abdomen is nondistended, soft and tender in lower abdomen. No organomegaly or masses felt. Normal bowel sounds heard. Central nervous system: Appears to be weaker on the left Extremities: No cyanosis or clubbing Skin: No rashes, lesions or ulcers Psychiatry: Patient is somnolent, minimal conversation    Data Reviewed: I have personally reviewed following labs and imaging studies  CBC: Recent Labs  Lab 10/13/17 0104 10/13/17  0654 10/14/17 0342  WBC 17.5* 14.6* 13.3*  NEUTROABS 15.5*  --   --   HGB 12.2 11.2* 10.4*  HCT 37.8 34.4* 33.1*  MCV 82.9 83.1 85.1  PLT 398 359 643   Basic Metabolic Panel: Recent Labs  Lab 10/13/17 0104 10/13/17 0654 10/14/17 0342  NA 146* 146* 145  K 4.2 3.9 4.6  CL 103 104 110  CO2 28 30 23   GLUCOSE 260* 202* 233*  BUN 29* 32* 42*  CREATININE 1.70* 1.76* 2.37*  CALCIUM 8.3*  7.6* 7.3*  MG  --   --  2.4   GFR: Estimated Creatinine Clearance: 20.1 mL/min (A) (by C-G formula based on SCr of 2.37 mg/dL (H)). Liver Function Tests: Recent Labs  Lab 10/13/17 0104 10/13/17 0654 10/14/17 0342  AST 41 35 43*  ALT 18 16 14   ALKPHOS 103 89 84  BILITOT 0.9 0.7 1.0  PROT 6.5 5.8* 5.4*  ALBUMIN 2.4* 2.0* 1.8*   Recent Labs  Lab 10/13/17 0104  LIPASE 37   No results for input(s): AMMONIA in the last 168 hours. Coagulation Profile: No results for input(s): INR, PROTIME in the last 168 hours. Cardiac Enzymes: Recent Labs  Lab 10/13/17 0104 10/13/17 0654 10/13/17 1339 10/13/17 1856  TROPONINI 0.03* 0.04* 0.04* 0.05*   BNP (last 3 results) No results for input(s): PROBNP in the last 8760 hours. HbA1C: Recent Labs    10/13/17 0654  HGBA1C 5.4   CBG: Recent Labs  Lab 10/13/17 1134 10/13/17 1621 10/13/17 1746 10/13/17 2117 10/14/17 0746  GLUCAP 171* 187* 252* 223* 288*   Lipid Profile: No results for input(s): CHOL, HDL, LDLCALC, TRIG, CHOLHDL, LDLDIRECT in the last 72 hours. Thyroid Function Tests: No results for input(s): TSH, T4TOTAL, FREET4, T3FREE, THYROIDAB in the last 72 hours. Anemia Panel: No results for input(s): VITAMINB12, FOLATE, FERRITIN, TIBC, IRON, RETICCTPCT in the last 72 hours. Sepsis Labs: No results for input(s): PROCALCITON, LATICACIDVEN in the last 168 hours.  Recent Results (from the past 240 hour(s))  MRSA PCR Screening     Status: None   Collection Time: 10/13/17  6:06 AM  Result Value Ref Range Status   MRSA by PCR NEGATIVE NEGATIVE Final    Comment:        The GeneXpert MRSA Assay (FDA approved for NASAL specimens only), is one component of a comprehensive MRSA colonization surveillance program. It is not intended to diagnose MRSA infection nor to guide or monitor treatment for MRSA infections. Performed at [facility], [address], [place], [place] [address]          Radiology Studies: Mr Virgel Paling OA Contrast  Result Date: 10/13/2017 CLINICAL DATA:  Altered mental status over the last 2 days. Left MCA stroke. EXAM: MRI HEAD WITHOUT CONTRAST MRA HEAD WITHOUT CONTRAST TECHNIQUE: Multiplanar, multiecho pulse sequences of the brain and surrounding structures were obtained without intravenous contrast. Angiographic images of the head were obtained using MRA technique without contrast. COMPARISON:  CT 06/17/2017.  MRI 06/16/2017. FINDINGS: MRI HEAD FINDINGS Brain: Diffusion imaging shows evidence of the old left MCA territory infarction. Superior and medial to the region of old infarction, there is an area of questionable restricted diffusion that could represent extension along the posterior margin. This signal does not show strong hypointensity on the ADC maps in therefore is not absolutely certain to represent a recent change in could be related to the previous infarction. There is cerebellar atrophy. No focal brainstem finding. Cerebral hemispheres elsewhere show chronic small-vessel  ischemic changes of the white matter. No other large vessel territory infarction. There is some residual hemosiderin deposition in the region of old infarction. No suspicion of acute hemorrhage. No hydrocephalus or extra-axial collection. Vascular: Major vessels at the base of the brain show flow. Skull and upper cervical spine: Negative Sinuses/Orbits: Clear/normal Other: None MRA HEAD FINDINGS Severely motion degraded exam. Both internal carotid arteries are patent through the skull base and siphon regions. There is flow in both anterior cerebral arteries and in the right middle cerebral artery. Distal left MCA branch vessels do not show flow. Both vertebral arteries show flow to the basilar. The basilar is patent without likely stenosis. Posterior circulation branch vessels show flow, but detail is quite limited. IMPRESSION: Severely motion degraded MR angiography. Diminished flow in the distal left MCA branches. Old left  MCA territory infarction with atrophy, encephalomalacia and gliosis. Question acute extension along the posteromedial margin of the infarction at the high left parietal region. This is not definite, as there does not appear to be marked signal reduction on the ADC map. It is possible this signal pattern could relate to the old infarction/T2 shine through. Electronically Signed   By: Nelson Chimes M.D.   On: 10/13/2017 13:56   Mr Brain Wo Contrast  Result Date: 10/13/2017 CLINICAL DATA:  Altered mental status over the last 2 days. Left MCA stroke. EXAM: MRI HEAD WITHOUT CONTRAST MRA HEAD WITHOUT CONTRAST TECHNIQUE: Multiplanar, multiecho pulse sequences of the brain and surrounding structures were obtained without intravenous contrast. Angiographic images of the head were obtained using MRA technique without contrast. COMPARISON:  CT 06/17/2017.  MRI 06/16/2017. FINDINGS: MRI HEAD FINDINGS Brain: Diffusion imaging shows evidence of the old left MCA territory infarction. Superior and medial to the region of old infarction, there is an area of questionable restricted diffusion that could represent extension along the posterior margin. This signal does not show strong hypointensity on the ADC maps in therefore is not absolutely certain to represent a recent change in could be related to the previous infarction. There is cerebellar atrophy. No focal brainstem finding. Cerebral hemispheres elsewhere show chronic small-vessel ischemic changes of the white matter. No other large vessel territory infarction. There is some residual hemosiderin deposition in the region of old infarction. No suspicion of acute hemorrhage. No hydrocephalus or extra-axial collection. Vascular: Major vessels at the base of the brain show flow. Skull and upper cervical spine: Negative Sinuses/Orbits: Clear/normal Other: None MRA HEAD FINDINGS Severely motion degraded exam. Both internal carotid arteries are patent through the skull base and  siphon regions. There is flow in both anterior cerebral arteries and in the right middle cerebral artery. Distal left MCA branch vessels do not show flow. Both vertebral arteries show flow to the basilar. The basilar is patent without likely stenosis. Posterior circulation branch vessels show flow, but detail is quite limited. IMPRESSION: Severely motion degraded MR angiography. Diminished flow in the distal left MCA branches. Old left MCA territory infarction with atrophy, encephalomalacia and gliosis. Question acute extension along the posteromedial margin of the infarction at the high left parietal region. This is not definite, as there does not appear to be marked signal reduction on the ADC map. It is possible this signal pattern could relate to the old infarction/T2 shine through. Electronically Signed   By: Nelson Chimes M.D.   On: 10/13/2017 13:56   Ct Abdomen Pelvis W Contrast  Result Date: 10/13/2017 CLINICAL DATA:  Vomiting EXAM: CT ABDOMEN AND PELVIS WITH CONTRAST TECHNIQUE:  Multidetector CT imaging of the abdomen and pelvis was performed using the standard protocol following bolus administration of intravenous contrast. CONTRAST:  93mL ISOVUE-300 IOPAMIDOL (ISOVUE-300) INJECTION 61% COMPARISON:  05/29/2016 FINDINGS: Lower chest: Lung bases are essentially clear. Hepatobiliary: Liver is notable for suspected focal steatosis superior to the gallbladder fossa (coronal image 38). Layering gallbladder sludge (series 2/image 19). No inflammatory changes. No intrahepatic or extrahepatic ductal dilatation. Pancreas: Within normal limits. Spleen: Within normal limits. Adrenals/Urinary Tract: Adrenal glands are within normal limits. Right lower pole renal cortical scarring. 4 mm nonobstructing right upper pole renal calculus (series 2/image 30). Parenchymal calcification in the right lower kidney. Left renal cysts, measuring up to 5.6 cm in the posterior left upper kidney. No hydronephrosis. Bladder is within  normal limits. Stomach/Bowel: Stomach is within normal limits. No evidence of bowel obstruction. Appendix is not discretely visualized. Moderate rectal stool burden. Mild wall thickening involving the sigmoid colon with mild pericolonic inflammatory changes (series 2/image 68), suggesting colitis. No pneumatosis or free air. Vascular/Lymphatic: No evidence of abdominal aortic aneurysm. Atherosclerotic calcifications of the abdominal aorta and branch vessels. No suspicious abdominopelvic lymphadenopathy. Reproductive: Status post hysterectomy. No adnexal masses. Other: No abdominopelvic ascites. Musculoskeletal: Degenerative changes of the visualized thoracolumbar spine. IMPRESSION: Wall thickening/inflammatory changes involving the sigmoid colon, suggesting colitis. No pneumatosis or free air. Moderate rectal stool burden, suggesting fecal impaction. No evidence of bowel obstruction. Additional ancillary findings as above. Electronically Signed   By: Julian Hy M.D.   On: 10/13/2017 02:36        Scheduled Meds: . atorvastatin  40 mg Oral q1800  . bethanechol  25 mg Oral TID  . citalopram  10 mg Oral Daily  . clopidogrel  75 mg Oral Q breakfast  . enoxaparin (LOVENOX) injection  30 mg Subcutaneous Q24H  . feeding supplement (ENSURE ENLIVE)  237 mL Oral BID BM  . insulin aspart  0-9 Units Subcutaneous TID WC  . insulin glargine  10 Units Subcutaneous Daily  . senna-docusate  2 tablet Oral QHS  . tamsulosin  0.4 mg Oral QPC supper   Continuous Infusions: . sodium chloride 140 mL/hr at 10/13/17 2300  . ciprofloxacin Stopped (10/14/17 1610)  . levETIRAcetam Stopped (10/14/17 0748)  . metronidazole Stopped (10/14/17 0601)     LOS: 1 day    Time spent: 30 minutes    Kathie Dike, MD [facility] Pager [phone]  If 7PM-7AM, please contact night-coverage [email or web address] Password [email or web address] 10/14/2017, 11:06 AM

## 2017-10-14 NOTE — Progress Notes (Signed)
SLP Cancellation Note  Patient Details Name: Ryiah Bellissimo MRN: [id number] DOB: 13-May-1941   Cancelled treatment:         SLP attempted diet check for Pt; however, Pt unable to be aroused via name, face and sternal rub.  Nasal cannula out of position on entry.  SLP repositioned. SLP consulted nursing.  Nurse stated Pt asleep most of day and unable to take meds orally; therefore, provided intravenously.    Will continue to follow during hospitalization for safest diet upon MD approval of PO intake.  [place] 10/14/2017, 2:17 PM

## 2017-10-15 ENCOUNTER — Inpatient Hospital Stay (HOSPITAL_COMMUNITY): Payer: Medicare Other

## 2017-10-15 LAB — CBC
HCT: 32.1 % — ABNORMAL LOW (ref 36.0–46.0)
HEMOGLOBIN: 10.2 g/dL — AB (ref 12.0–15.0)
MCH: 26.5 pg (ref 26.0–34.0)
MCHC: 31.8 g/dL (ref 30.0–36.0)
MCV: 83.4 fL (ref 78.0–100.0)
Platelets: 329 10*3/uL (ref 150–400)
RBC: 3.85 MIL/uL — ABNORMAL LOW (ref 3.87–5.11)
RDW: 17.3 % — AB (ref 11.5–15.5)
WBC: 13 10*3/uL — AB (ref 4.0–10.5)

## 2017-10-15 LAB — AMMONIA: Ammonia: 30 umol/L (ref 9–35)

## 2017-10-15 LAB — BLOOD GAS, ARTERIAL
Acid-base deficit: 1.4 mmol/L (ref 0.0–2.0)
BICARBONATE: 23.2 mmol/L (ref 20.0–28.0)
Drawn by: 27016
O2 Content: 3 L/min
O2 Saturation: 94.9 %
PATIENT TEMPERATURE: 37
PCO2 ART: 39.1 mmHg (ref 32.0–48.0)
PH ART: 7.386 (ref 7.350–7.450)
PO2 ART: 75.7 mmHg — AB (ref 83.0–108.0)

## 2017-10-15 LAB — BASIC METABOLIC PANEL
ANION GAP: 10 (ref 5–15)
BUN: 54 mg/dL — AB (ref 6–20)
CALCIUM: 7 mg/dL — AB (ref 8.9–10.3)
CO2: 25 mmol/L (ref 22–32)
Chloride: 104 mmol/L (ref 101–111)
Creatinine, Ser: 2.71 mg/dL — ABNORMAL HIGH (ref 0.44–1.00)
GFR calc Af Amer: 18 mL/min — ABNORMAL LOW (ref >60)
GFR, EST NON AFRICAN AMERICAN: 16 mL/min — AB (ref >60)
Glucose, Bld: 99 mg/dL (ref 65–99)
POTASSIUM: 3.9 mmol/L (ref 3.5–5.1)
SODIUM: 139 mmol/L (ref 135–145)

## 2017-10-15 LAB — GLUCOSE, CAPILLARY
GLUCOSE-CAPILLARY: 88 mg/dL (ref 65–99)
GLUCOSE-CAPILLARY: 139 mg/dL — AB (ref 65–99)
GLUCOSE-CAPILLARY: 203 mg/dL — AB (ref 65–99)
Glucose-Capillary: 134 mg/dL — ABNORMAL HIGH (ref 65–99)
Glucose-Capillary: 103 mg/dL — ABNORMAL HIGH (ref 65–99)

## 2017-10-15 LAB — CREATININE, URINE, RANDOM: Creatinine, Urine: 112.88 mg/dL

## 2017-10-15 LAB — SODIUM, URINE, RANDOM

## 2017-10-15 MED ORDER — SORBITOL 70 % SOLN
960.0000 mL | TOPICAL_OIL | Freq: Once | ORAL | Status: AC
  Administered 2017-10-15: 960 mL via RECTAL
  Filled 2017-10-15: qty 473

## 2017-10-15 MED ORDER — FUROSEMIDE 10 MG/ML IJ SOLN
40.0000 mg | Freq: Two times a day (BID) | INTRAMUSCULAR | Status: DC
  Administered 2017-10-15 – 2017-10-18 (×7): 40 mg via INTRAVENOUS
  Filled 2017-10-15 (×7): qty 4

## 2017-10-15 NOTE — Consult Note (Signed)
Reason for Consult: Acute kidney injury  Referring Physician: Dr. Emmit Pomfret Sandra [name] is an 77 y.o. female.  HPI: She is a patient who has history of diabetes, hypertension, CVA with aphasia presently sent from nursing home because of vomiting and some abdominal pain.  When patient was evaluated she was found to have sigmoid colitis and patient admitted to the hospital.  Presently consult is called because of worsening of renal failure.  At this moment unable to get more information from the patient.  However from her previous blood work patient seems to have presently acute kidney injury.  Past Medical History:  Diagnosis Date  . Colon polyp   . Constipation   . Diabetes mellitus without complication ([place])   . GI bleed   . Hemorrhoids   . High cholesterol   . Hypertension   . PAD (peripheral artery disease) ([place])   . Peripheral neuropathy   . Rectal bleeding    "for 7 years"  . Rectal bleeding   . Stroke [facility])     Past Surgical History:  Procedure Laterality Date  . ABDOMINAL HYSTERECTOMY    . COLONOSCOPY     X 7  . COLONOSCOPY N/A 03/21/2017   Procedure: COLONOSCOPY;  Surgeon: Danie Binder, MD;  Location: [facility];  Service: Endoscopy;  Laterality: N/A;  1215     Family History  Problem Relation Age of Onset  . Diabetes Mother   . Cancer Father   . Diabetes Sister   . Diabetes Brother   . Diabetes Daughter   . Diabetes Maternal Grandmother   . Diabetes Daughter   . Colon cancer Neg Hx     Social History:  reports that she has never smoked. She has never used smokeless tobacco. She reports that she does not drink alcohol or use drugs.  Allergies:  Allergies  Allergen Reactions  . Hctz [Hydrochlorothiazide] Other (See Comments)    Caused pancreatitis flare up  . Morphine And Related Other (See Comments)    dizziness  . Valium [Diazepam] Other (See Comments)    Confusion, blurred vision    Medications: I have reviewed the patient's current  medications.  Results for orders placed or performed during the hospital encounter of 10/12/17 (from the past 48 hour(s))  Glucose, capillary     Status: Abnormal   Collection Time: 10/13/17 11:34 AM  Result Value Ref Range   Glucose-Capillary 171 (H) 65 - 99 mg/dL  Troponin I (q 6hr x 3)     Status: Abnormal   Collection Time: 10/13/17  1:39 PM  Result Value Ref Range   Troponin I 0.04 (HH) <0.03 ng/mL    Comment: CRITICAL VALUE NOTED.  VALUE IS CONSISTENT WITH PREVIOUSLY REPORTED AND CALLED VALUE. Performed at [facility] [address] [place], [place] [address]   Glucose, capillary     Status: Abnormal   Collection Time: 10/13/17  4:21 PM  Result Value Ref Range   Glucose-Capillary 187 (H) 65 - 99 mg/dL   Comment 1 Notify RN   Glucose, capillary     Status: Abnormal   Collection Time: 10/13/17  5:46 PM  Result Value Ref Range   Glucose-Capillary 252 (H) 65 - 99 mg/dL   Comment 1 Notify RN   Troponin I (q 6hr x 3)     Status: Abnormal   Collection Time: 10/13/17  6:56 PM  Result Value Ref Range   Troponin I 0.05 (HH) <0.03 ng/mL    Comment: CRITICAL VALUE NOTED.  VALUE IS CONSISTENT WITH PREVIOUSLY REPORTED AND CALLED VALUE. Performed at [facility], [address], [place], [place] [address]   Glucose, capillary     Status: Abnormal   Collection Time: 10/13/17  9:17 PM  Result Value Ref Range   Glucose-Capillary 223 (H) 65 - 99 mg/dL   Comment 1 Notify RN   CBC     Status: Abnormal   Collection Time: 10/14/17  3:42 AM  Result Value Ref Range   WBC 13.3 (H) 4.0 - 10.5 K/uL   RBC 3.89 3.87 - 5.11 MIL/uL   Hemoglobin 10.4 (L) 12.0 - 15.0 g/dL   HCT 33.1 (L) 36.0 - 46.0 %   MCV 85.1 78.0 - 100.0 fL   MCH 26.7 26.0 - 34.0 pg   MCHC 31.4 30.0 - 36.0 g/dL   RDW 17.4 (H) 11.5 - 15.5 %   Platelets 312 150 - 400 K/uL    Comment: Performed at [facility], [address] [place], [place] [address]  Comprehensive metabolic panel     Status: Abnormal   Collection Time:  10/14/17  3:42 AM  Result Value Ref Range   Sodium 145 135 - 145 mmol/L   Potassium 4.6 3.5 - 5.1 mmol/L   Chloride 110 101 - 111 mmol/L   CO2 23 22 - 32 mmol/L   Glucose, Bld 233 (H) 65 - 99 mg/dL   BUN 42 (H) 6 - 20 mg/dL   Creatinine, Ser 2.37 (H) 0.44 - 1.00 mg/dL   Calcium 7.3 (L) 8.9 - 10.3 mg/dL   Total Protein 5.4 (L) 6.5 - 8.1 g/dL   Albumin 1.8 (L) 3.5 - 5.0 g/dL   AST 43 (H) 15 - 41 U/L   ALT 14 14 - 54 U/L   Alkaline Phosphatase 84 38 - 126 U/L   Total Bilirubin 1.0 0.3 - 1.2 mg/dL   GFR calc non Af Amer 19 (L) >60 mL/min   GFR calc Af Amer 21 (L) >60 mL/min    Comment: (NOTE) The eGFR has been calculated using the CKD EPI equation. This calculation has not been validated in all clinical situations. eGFR's persistently <60 mL/min signify possible Chronic Kidney Disease.    Anion gap 12 5 - 15    Comment: Performed at [facility], [address], [place], [place] [address]  Magnesium     Status: None   Collection Time: 10/14/17  3:42 AM  Result Value Ref Range   Magnesium 2.4 1.7 - 2.4 mg/dL    Comment: Performed at [facility], [address], [place], [place] [address]  Glucose, capillary     Status: Abnormal   Collection Time: 10/14/17  7:46 AM  Result Value Ref Range   Glucose-Capillary 288 (H) 65 - 99 mg/dL   Comment 1 Notify RN   Glucose, capillary     Status: Abnormal   Collection Time: 10/14/17 11:19 AM  Result Value Ref Range   Glucose-Capillary 173 (H) 65 - 99 mg/dL   Comment 1 Notify RN   Urinalysis, Routine w reflex microscopic     Status: Abnormal   Collection Time: 10/14/17  3:00 PM  Result Value Ref Range   Color, Urine AMBER (A) YELLOW    Comment: BIOCHEMICALS MAY BE AFFECTED BY COLOR   APPearance HAZY (A) CLEAR   Specific Gravity, Urine 1.041 (H) 1.005 - 1.030   pH 5.0 5.0 - 8.0   Glucose, UA NEGATIVE NEGATIVE mg/dL   Hgb urine dipstick NEGATIVE NEGATIVE   Bilirubin Urine  SMALL (A) NEGATIVE   Ketones, ur NEGATIVE NEGATIVE mg/dL    Protein, ur NEGATIVE NEGATIVE mg/dL   Nitrite NEGATIVE NEGATIVE   Leukocytes, UA SMALL (A) NEGATIVE   RBC / HPF 0-5 0 - 5 RBC/hpf   WBC, UA >50 (H) 0 - 5 WBC/hpf   Bacteria, UA RARE (A) NONE SEEN   Squamous Epithelial / LPF 0-5 0 - 5   WBC Clumps PRESENT    Mucus PRESENT    Budding Yeast PRESENT    Hyaline Casts, UA PRESENT     Comment: Performed at [facility], [address] [place], [place] [address]  Glucose, capillary     Status: Abnormal   Collection Time: 10/14/17  9:55 PM  Result Value Ref Range   Glucose-Capillary 115 (H) 65 - 99 mg/dL   Comment 1 Notify RN    Comment 2 Document in Chart   Basic metabolic panel     Status: Abnormal   Collection Time: 10/15/17  5:57 AM  Result Value Ref Range   Sodium 139 135 - 145 mmol/L   Potassium 3.9 3.5 - 5.1 mmol/L   Chloride 104 101 - 111 mmol/L   CO2 25 22 - 32 mmol/L   Glucose, Bld 99 65 - 99 mg/dL   BUN 54 (H) 6 - 20 mg/dL   Creatinine, Ser 2.71 (H) 0.44 - 1.00 mg/dL   Calcium 7.0 (L) 8.9 - 10.3 mg/dL   GFR calc non Af Amer 16 (L) >60 mL/min   GFR calc Af Amer 18 (L) >60 mL/min    Comment: (NOTE) The eGFR has been calculated using the CKD EPI equation. This calculation has not been validated in all clinical situations. eGFR's persistently <60 mL/min signify possible Chronic Kidney Disease.    Anion gap 10 5 - 15    Comment: Performed at [facility], [address] [place], [place] [address]  CBC     Status: Abnormal   Collection Time: 10/15/17  5:57 AM  Result Value Ref Range   WBC 13.0 (H) 4.0 - 10.5 K/uL   RBC 3.85 (L) 3.87 - 5.11 MIL/uL   Hemoglobin 10.2 (L) 12.0 - 15.0 g/dL   HCT 32.1 (L) 36.0 - 46.0 %   MCV 83.4 78.0 - 100.0 fL   MCH 26.5 26.0 - 34.0 pg   MCHC 31.8 30.0 - 36.0 g/dL   RDW 17.3 (H) 11.5 - 15.5 %   Platelets 329 150 - 400 K/uL    Comment: Performed at [facility], [address], [place], [place] [address]  Glucose, capillary     Status: None   Collection Time: 10/15/17  8:01 AM  Result  Value Ref Range   Glucose-Capillary 88 65 - 99 mg/dL    Mr Jodene Nam Head Wo Contrast  Result Date: 10/13/2017 CLINICAL DATA:  Altered mental status over the last 2 days. Left MCA stroke. EXAM: MRI HEAD WITHOUT CONTRAST MRA HEAD WITHOUT CONTRAST TECHNIQUE: Multiplanar, multiecho pulse sequences of the brain and surrounding structures were obtained without intravenous contrast. Angiographic images of the head were obtained using MRA technique without contrast. COMPARISON:  CT 06/17/2017.  MRI 06/16/2017. FINDINGS: MRI HEAD FINDINGS Brain: Diffusion imaging shows evidence of the old left MCA territory infarction. Superior and medial to the region of old infarction, there is an area of questionable restricted diffusion that could represent extension along the posterior margin. This signal does not show strong hypointensity on the ADC maps in therefore is not absolutely certain to represent a recent  change in could be related to the previous infarction. There is cerebellar atrophy. No focal brainstem finding. Cerebral hemispheres elsewhere show chronic small-vessel ischemic changes of the white matter. No other large vessel territory infarction. There is some residual hemosiderin deposition in the region of old infarction. No suspicion of acute hemorrhage. No hydrocephalus or extra-axial collection. Vascular: Major vessels at the base of the brain show flow. Skull and upper cervical spine: Negative Sinuses/Orbits: Clear/normal Other: None MRA HEAD FINDINGS Severely motion degraded exam. Both internal carotid arteries are patent through the skull base and siphon regions. There is flow in both anterior cerebral arteries and in the right middle cerebral artery. Distal left MCA branch vessels do not show flow. Both vertebral arteries show flow to the basilar. The basilar is patent without likely stenosis. Posterior circulation branch vessels show flow, but detail is quite limited. IMPRESSION: Severely motion degraded MR  angiography. Diminished flow in the distal left MCA branches. Old left MCA territory infarction with atrophy, encephalomalacia and gliosis. Question acute extension along the posteromedial margin of the infarction at the high left parietal region. This is not definite, as there does not appear to be marked signal reduction on the ADC map. It is possible this signal pattern could relate to the old infarction/T2 shine through. Electronically Signed   By: Nelson Chimes M.D.   On: 10/13/2017 13:56   Mr Brain Wo Contrast  Result Date: 10/13/2017 CLINICAL DATA:  Altered mental status over the last 2 days. Left MCA stroke. EXAM: MRI HEAD WITHOUT CONTRAST MRA HEAD WITHOUT CONTRAST TECHNIQUE: Multiplanar, multiecho pulse sequences of the brain and surrounding structures were obtained without intravenous contrast. Angiographic images of the head were obtained using MRA technique without contrast. COMPARISON:  CT 06/17/2017.  MRI 06/16/2017. FINDINGS: MRI HEAD FINDINGS Brain: Diffusion imaging shows evidence of the old left MCA territory infarction. Superior and medial to the region of old infarction, there is an area of questionable restricted diffusion that could represent extension along the posterior margin. This signal does not show strong hypointensity on the ADC maps in therefore is not absolutely certain to represent a recent change in could be related to the previous infarction. There is cerebellar atrophy. No focal brainstem finding. Cerebral hemispheres elsewhere show chronic small-vessel ischemic changes of the white matter. No other large vessel territory infarction. There is some residual hemosiderin deposition in the region of old infarction. No suspicion of acute hemorrhage. No hydrocephalus or extra-axial collection. Vascular: Major vessels at the base of the brain show flow. Skull and upper cervical spine: Negative Sinuses/Orbits: Clear/normal Other: None MRA HEAD FINDINGS Severely motion degraded exam.  Both internal carotid arteries are patent through the skull base and siphon regions. There is flow in both anterior cerebral arteries and in the right middle cerebral artery. Distal left MCA branch vessels do not show flow. Both vertebral arteries show flow to the basilar. The basilar is patent without likely stenosis. Posterior circulation branch vessels show flow, but detail is quite limited. IMPRESSION: Severely motion degraded MR angiography. Diminished flow in the distal left MCA branches. Old left MCA territory infarction with atrophy, encephalomalacia and gliosis. Question acute extension along the posteromedial margin of the infarction at the high left parietal region. This is not definite, as there does not appear to be marked signal reduction on the ADC map. It is possible this signal pattern could relate to the old infarction/T2 shine through. Electronically Signed   By: Nelson Chimes M.D.   On: 10/13/2017 13:56  Review of Systems  Unable to perform ROS: Patient nonverbal   Blood pressure (!) 117/54, pulse 81, temperature 98.2 F (36.8 C), temperature source Oral, resp. rate 17, weight 80.3 kg (177 lb 0.5 oz), SpO2 98 %. Physical Exam  Eyes: No scleral icterus.  Neck: No JVD present.  Cardiovascular: Normal rate and regular rhythm.  Respiratory: No respiratory distress. She has no wheezes.  GI: She exhibits no distension. There is tenderness.  Musculoskeletal: She exhibits no edema.  Neurological: She is alert.  Patient with severe CVA and aphasic.  She tried to answer questions by shaking her head.  Her voice is inaudible.    Assessment/Plan: 1] acute kidney injury: Her creatinine was 0.62 on 07/11/2017 about 3 months ago.  When she came to the hospital her creatinine was 1.70 and sodium of 146.  Hence possibly prerenal syndrome versus ATN.  Presently her creatinine has increased significantly and this increase could be secondary to dye induced acute kidney injury as patient has  received contrast on 09/26/2017.  Presently patient is nonoliguric. 2] hyponatremia: Her sodium has corrected. 3] history of sigmoid colitis 4] hypertension: Her blood pressure is reasonably controlled 5] history of diabetes 6] history of CVA with aphasia. Plan: 1] agree with hydration 2] we will start on Lasix 40 mg IV twice daily to improve her urine output 3] we will check a renal panel in the morning. 4] we will check urine sodium and creatinine. 5] we will do ultrasound of the kidneys to rule out obstruction. 6] if her renal function does not improve we will consider further work-up.  May Manrique S 10/15/2017, 9:16 AM

## 2017-10-15 NOTE — Progress Notes (Signed)
PROGRESS NOTE    Sandra [name]  WJX:[id number] DOB: May 03, 1941 DOA: 10/12/2017 PCP: Caren Macadam, MD    Brief Narrative:  77 year old female admitted to the hospital with abdominal pain. She was found to have colitis with possible fecal impaction on admission. 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 Neurology following. She also has worsening renal function. Nephrology consulted.    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 Colitis.  Currently on ciprofloxacin and Flagyl.  No nausea or vomiting.  Continue current treatments. 2. Acute renal failure.  Creatinine continues to trend up.  She did receive some IV contrast on admission.  Continue on IV fluids.  Nephrology following.  IV Lasix has been added.  No hydronephrosis on CT scan done on admission.  Renal ultrasound has been ordered. 3. Partial seizures.  Neurology following.  Started on Keppra. 4. Acute encephalopathy, likely metabolic.  Patient is worsening renal failure and was recently started on Keppra.  She also may have some degree of dehydration.  She remains lethargic.  We will also check ABG and ammonia. 5. Diabetes.  On sliding scale insulin.  Also on Lantus.  Blood sugar stable.  Continue to monitor. 6. Hypotension. Still having episodes of soft blood pressures. Will check cortisol 7. Previous stroke.  She has residual right-sided hemiparesis.  Continue on Plavix.  MRI performed during this hospitalization was poor quality. 8. Possible fecal impaction.  She has only  had small amounts of stool. Will order enema. 9. UTI. Urinalysis indicates pyuria. Will check urine culture. She is already on ciprofloxacin so culture may be low yield.   DVT prophylaxis: Lovenox Code Status: Full code Family Communication: No family present Disposition Plan: Return to nursing facility at discharge   Consultants:   Neurology  Nephrology  Procedures:  EEG:1. Frontal intermittent rhythmic delta activities are observed. This is typically seen in toxic metabolic encephalopathies.  2. Triphasic waves are also observed typically seen in metabolic encephalopathies particularly due to hepatic failure or renal failure.  Antimicrobials:   Ciprofloxacin 5/30 >  Flagyl 5/30>   Subjective: Does not particularly answer questions.  Sitting up in bed provide  Objective: Vitals:   10/15/17 0600 10/15/17 0700 10/15/17 0800 10/15/17 0900  BP: (!) 109/52 94/60 (!) 112/48 (!) 117/54  Pulse: 83 76 79 81  Resp: 19 19 19 17   Temp:   98.2 F (36.8 C)   TempSrc:   Oral   SpO2: 94% 98% 96% 98%  Weight:        Intake/Output Summary (Last 24 hours) at 10/15/2017 1036 Last data filed at 10/15/2017 0600 Gross per 24 hour  Intake 4375.33 ml  Output 400 ml  Net 3975.33 ml   Filed Weights   10/13/17 0618 10/14/17 0500 10/15/17 0400  Weight: 76.9 kg (169 lb 8.5 oz) 76.9 kg (169 lb 8.5 oz) 80.3 kg (177 lb 0.5 oz)    Examination:  General exam: Alert, awake, no distress Respiratory system: Clear to auscultation. Respiratory effort normal. Cardiovascular  system:RRR. No murmurs, rubs, gallops. Gastrointestinal system: Abdomen is nondistended, soft and nontender. No organomegaly or masses felt. Normal bowel sounds heard. Central nervous system: Awake, appears to be weaker on the left Extremities: No C/C/E, +pedal pulses Skin: No rashes, lesions or ulcers Psychiatry: Patient is nonverbal     Data Reviewed: I have personally reviewed following labs and imaging  studies  CBC: Recent Labs  Lab 10/13/17 0104 10/13/17 0654 10/14/17 0342 10/15/17 0557  WBC 17.5* 14.6* 13.3* 13.0*  NEUTROABS 15.5*  --   --   --   HGB 12.2 11.2* 10.4* 10.2*  HCT 37.8 34.4* 33.1* 32.1*  MCV 82.9 83.1 85.1 83.4  PLT 398 359 312 093   Basic Metabolic Panel: Recent Labs  Lab 10/13/17 0104 10/13/17 0654 10/14/17 0342 10/15/17 0557  NA 146* 146* 145 139  K 4.2 3.9 4.6 3.9  CL 103 104 110 104  CO2 28 30 23 25   GLUCOSE 260* 202* 233* 99  BUN 29* 32* 42* 54*  CREATININE 1.70* 1.76* 2.37* 2.71*  CALCIUM 8.3* 7.6* 7.3* 7.0*  MG  --   --  2.4  --    GFR: Estimated Creatinine Clearance: 17.9 mL/min (A) (by C-G formula based on SCr of 2.71 mg/dL (H)). Liver Function Tests: Recent Labs  Lab 10/13/17 0104 10/13/17 0654 10/14/17 0342  AST 41 35 43*  ALT 18 16 14   ALKPHOS 103 89 84  BILITOT 0.9 0.7 1.0  PROT 6.5 5.8* 5.4*  ALBUMIN 2.4* 2.0* 1.8*   Recent Labs  Lab 10/13/17 0104  LIPASE 37   No results for input(s): AMMONIA in the last 168 hours. Coagulation Profile: No results for input(s): INR, PROTIME in the last 168 hours. Cardiac Enzymes: Recent Labs  Lab 10/13/17 0104 10/13/17 0654 10/13/17 1339 10/13/17 1856  TROPONINI 0.03* 0.04* 0.04* 0.05*   BNP (last 3 results) No results for input(s): PROBNP in the last 8760 hours. HbA1C: Recent Labs    10/13/17 0654  HGBA1C 5.4   CBG: Recent Labs  Lab 10/13/17 2117 10/14/17 0746 10/14/17 1119 10/14/17 2155 10/15/17 0801  GLUCAP 223* 288* 173* 115* 88   Lipid Profile: No results for input(s): CHOL, HDL, LDLCALC, TRIG, CHOLHDL, LDLDIRECT in the last 72 hours. Thyroid Function Tests: No results for input(s): TSH, T4TOTAL, FREET4, T3FREE, THYROIDAB in the last 72 hours. Anemia Panel: No results for input(s): VITAMINB12, FOLATE, FERRITIN, TIBC, IRON, RETICCTPCT in the last 72 hours. Sepsis Labs: No results for input(s): PROCALCITON, LATICACIDVEN in the last 168 hours.  Recent Results  (from the past 240 hour(s))  MRSA PCR Screening     Status: None   Collection Time: 10/13/17  6:06 AM  Result Value Ref Range Status   MRSA by PCR NEGATIVE NEGATIVE Final    Comment:        The GeneXpert MRSA Assay (FDA approved for NASAL specimens only), is one component of a comprehensive MRSA colonization surveillance program. It is not intended to diagnose MRSA infection nor to guide or monitor treatment for MRSA infections. Performed at [facility], [address], [place], [place] [address]          Radiology Studies: Mr Virgel Paling HB Contrast  Result Date: 10/13/2017 CLINICAL DATA:  Altered mental status over the last 2 days. Left MCA stroke. EXAM: MRI HEAD WITHOUT CONTRAST MRA HEAD WITHOUT CONTRAST TECHNIQUE: Multiplanar, multiecho pulse sequences of the brain and surrounding structures were obtained without intravenous contrast. Angiographic images of the head were obtained using MRA  technique without contrast. COMPARISON:  CT 06/17/2017.  MRI 06/16/2017. FINDINGS: MRI HEAD FINDINGS Brain: Diffusion imaging shows evidence of the old left MCA territory infarction. Superior and medial to the region of old infarction, there is an area of questionable restricted diffusion that could represent extension along the posterior margin. This signal does not show strong hypointensity on the ADC maps in therefore is not absolutely certain to represent a recent change in could be related to the previous infarction. There is cerebellar atrophy. No focal brainstem finding. Cerebral hemispheres elsewhere show chronic small-vessel ischemic changes of the white matter. No other large vessel territory infarction. There is some residual hemosiderin deposition in the region of old infarction. No suspicion of acute hemorrhage. No hydrocephalus or extra-axial collection. Vascular: Major vessels at the base of the brain show flow. Skull and upper cervical spine: Negative Sinuses/Orbits: Clear/normal Other:  None MRA HEAD FINDINGS Severely motion degraded exam. Both internal carotid arteries are patent through the skull base and siphon regions. There is flow in both anterior cerebral arteries and in the right middle cerebral artery. Distal left MCA branch vessels do not show flow. Both vertebral arteries show flow to the basilar. The basilar is patent without likely stenosis. Posterior circulation branch vessels show flow, but detail is quite limited. IMPRESSION: Severely motion degraded MR angiography. Diminished flow in the distal left MCA branches. Old left MCA territory infarction with atrophy, encephalomalacia and gliosis. Question acute extension along the posteromedial margin of the infarction at the high left parietal region. This is not definite, as there does not appear to be marked signal reduction on the ADC map. It is possible this signal pattern could relate to the old infarction/T2 shine through. Electronically Signed   By: Nelson Chimes M.D.   On: 10/13/2017 13:56   Mr Brain Wo Contrast  Result Date: 10/13/2017 CLINICAL DATA:  Altered mental status over the last 2 days. Left MCA stroke. EXAM: MRI HEAD WITHOUT CONTRAST MRA HEAD WITHOUT CONTRAST TECHNIQUE: Multiplanar, multiecho pulse sequences of the brain and surrounding structures were obtained without intravenous contrast. Angiographic images of the head were obtained using MRA technique without contrast. COMPARISON:  CT 06/17/2017.  MRI 06/16/2017. FINDINGS: MRI HEAD FINDINGS Brain: Diffusion imaging shows evidence of the old left MCA territory infarction. Superior and medial to the region of old infarction, there is an area of questionable restricted diffusion that could represent extension along the posterior margin. This signal does not show strong hypointensity on the ADC maps in therefore is not absolutely certain to represent a recent change in could be related to the previous infarction. There is cerebellar atrophy. No focal brainstem  finding. Cerebral hemispheres elsewhere show chronic small-vessel ischemic changes of the white matter. No other large vessel territory infarction. There is some residual hemosiderin deposition in the region of old infarction. No suspicion of acute hemorrhage. No hydrocephalus or extra-axial collection. Vascular: Major vessels at the base of the brain show flow. Skull and upper cervical spine: Negative Sinuses/Orbits: Clear/normal Other: None MRA HEAD FINDINGS Severely motion degraded exam. Both internal carotid arteries are patent through the skull base and siphon regions. There is flow in both anterior cerebral arteries and in the right middle cerebral artery. Distal left MCA branch vessels do not show flow. Both vertebral arteries show flow to the basilar. The basilar is patent without likely stenosis. Posterior circulation branch vessels show flow, but detail is quite limited. IMPRESSION: Severely motion degraded MR angiography. Diminished flow in the distal left MCA branches.  Old left MCA territory infarction with atrophy, encephalomalacia and gliosis. Question acute extension along the posteromedial margin of the infarction at the high left parietal region. This is not definite, as there does not appear to be marked signal reduction on the ADC map. It is possible this signal pattern could relate to the old infarction/T2 shine through. Electronically Signed   By: Nelson Chimes M.D.   On: 10/13/2017 13:56        Scheduled Meds: . atorvastatin  40 mg Oral q1800  . bethanechol  25 mg Oral TID  . citalopram  10 mg Oral Daily  . clopidogrel  75 mg Oral Q breakfast  . enoxaparin (LOVENOX) injection  30 mg Subcutaneous Q24H  . feeding supplement (ENSURE ENLIVE)  237 mL Oral BID BM  . furosemide  40 mg Intravenous BID  . insulin aspart  0-9 Units Subcutaneous TID WC  . insulin glargine  10 Units Subcutaneous Daily  . mouth rinse  15 mL Mouth Rinse BID  . senna-docusate  2 tablet Oral QHS  . tamsulosin   0.4 mg Oral QPC supper   Continuous Infusions: . sodium chloride 140 mL/hr at 10/15/17 0742  . ciprofloxacin Stopped (10/15/17 0600)  . levETIRAcetam Stopped (10/15/17 0641)  . metronidazole Stopped (10/15/17 0600)     LOS: 2 days    Time spent: 30 minutes    Kathie Dike, MD [facility] Pager [phone]  If 7PM-7AM, please contact night-coverage [email or web address] Password [email or web address] 10/15/2017, 10:36 AM

## 2017-10-16 LAB — RENAL FUNCTION PANEL
ALBUMIN: 1.8 g/dL — AB (ref 3.5–5.0)
Anion gap: 9 (ref 5–15)
BUN: 61 mg/dL — AB (ref 6–20)
CALCIUM: 7.4 mg/dL — AB (ref 8.9–10.3)
CO2: 26 mmol/L (ref 22–32)
CREATININE: 2.16 mg/dL — AB (ref 0.44–1.00)
Chloride: 107 mmol/L (ref 101–111)
GFR calc Af Amer: 24 mL/min — ABNORMAL LOW (ref >60)
GFR, EST NON AFRICAN AMERICAN: 21 mL/min — AB (ref >60)
Glucose, Bld: 181 mg/dL — ABNORMAL HIGH (ref 65–99)
Phosphorus: 3.6 mg/dL (ref 2.5–4.6)
Potassium: 2.8 mmol/L — ABNORMAL LOW (ref 3.5–5.1)
SODIUM: 142 mmol/L (ref 135–145)

## 2017-10-16 LAB — URINE CULTURE: CULTURE: NO GROWTH

## 2017-10-16 LAB — GLUCOSE, CAPILLARY
GLUCOSE-CAPILLARY: 211 mg/dL — AB (ref 65–99)
GLUCOSE-CAPILLARY: 139 mg/dL — AB (ref 65–99)
GLUCOSE-CAPILLARY: 170 mg/dL — AB (ref 65–99)
Glucose-Capillary: 203 mg/dL — ABNORMAL HIGH (ref 65–99)

## 2017-10-16 LAB — MAGNESIUM: Magnesium: 2.9 mg/dL — ABNORMAL HIGH (ref 1.7–2.4)

## 2017-10-16 MED ORDER — CIPROFLOXACIN IN D5W 400 MG/200ML IV SOLN
400.0000 mg | INTRAVENOUS | Status: DC
  Administered 2017-10-17 – 2017-10-18 (×2): 400 mg via INTRAVENOUS
  Filled 2017-10-16 (×2): qty 200

## 2017-10-16 MED ORDER — POTASSIUM CHLORIDE IN NACL 40-0.9 MEQ/L-% IV SOLN
INTRAVENOUS | Status: DC
  Administered 2017-10-16 (×2): 100 mL/h via INTRAVENOUS

## 2017-10-16 MED ORDER — POTASSIUM CHLORIDE 10 MEQ/100ML IV SOLN
10.0000 meq | INTRAVENOUS | Status: AC
  Administered 2017-10-16 (×3): 10 meq via INTRAVENOUS
  Filled 2017-10-16 (×3): qty 100

## 2017-10-16 NOTE — Progress Notes (Signed)
Subjective: Interval History: none.  Objective: Vital signs in last 24 hours: Temp:  [97.6 F (36.4 C)-99.3 F (37.4 C)] 97.9 F (36.6 C) (06/02 0800) Pulse Rate:  [39-88] 88 (06/02 0758) Resp:  [18-23] 21 (06/02 0758) BP: (79-116)/(40-70) 102/53 (06/02 0758) SpO2:  [94 %-98 %] 95 % (06/02 0600) Weight:  [80.3 kg (177 lb 0.5 oz)] 80.3 kg (177 lb 0.5 oz) (06/02 0758) Weight change:   Intake/Output from previous day: 06/01 0701 - 06/02 0700 In: 4260 [I.V.:3360; IV Piggyback:900] Out: 250 [Urine:250] Intake/Output this shift: No intake/output data recorded.  Patient is awake but remain non-communicative. Chest is clear to auscultation Heart exam revealed regular rate and rhythm no murmur Extremities no edema  Lab Results: Recent Labs    10/14/17 0342 10/15/17 0557  WBC 13.3* 13.0*  HGB 10.4* 10.2*  HCT 33.1* 32.1*  PLT 312 329   BMET:  Recent Labs    10/15/17 0557 10/16/17 0522  NA 139 142  K 3.9 2.8*  CL 104 107  CO2 25 26  GLUCOSE 99 181*  BUN 54* 61*  CREATININE 2.71* 2.16*  CALCIUM 7.0* 7.4*   No results for input(s): PTH in the last 72 hours. Iron Studies: No results for input(s): IRON, TIBC, TRANSFERRIN, FERRITIN in the last 72 hours.  Studies/Results: US Renal  Result Date: 10/15/2017 CLINICAL DATA:  Acute renal failure. EXAM: RENAL / URINARY TRACT ULTRASOUND COMPLETE COMPARISON:  CT 10/13/2017 FINDINGS: Right Kidney: Length: Small, measuring 9.2 cm in length. Cortical atrophy. Lobular parenchyma probably secondary to the atrophy. Echogenicity within normal limits. No mass or hydronephrosis visualized. Left Kidney: Length: 10.2 cm. Normal cortical thickness. Echogenicity within normal limits. No mass or hydronephrosis visualized. 5 cm cyst in the midportion. Bladder: The bladder was not visualized and is presumably collapsed. IMPRESSION: No obstruction. Atrophy of the right kidney relative to the left, with small size and lobular contours. Electronically  Signed   By: Nelson Chimes M.D.   On: 10/15/2017 12:53   Dg Abd Portable 1v  Result Date: 10/15/2017 CLINICAL DATA:  Possible fecal impaction EXAM: PORTABLE ABDOMEN - 1 VIEW COMPARISON:  None. FINDINGS: Scattered large and small bowel gas is noted. No obstructive changes are seen. No constipation or fecal impaction is noted. Degenerative changes of lumbar spine are seen. No other focal abnormality is noted. IMPRESSION: No acute abnormality noted. Electronically Signed   By: Inez Catalina M.D.   On: 10/15/2017 18:41    I have reviewed the patient's current medications.  Assessment/Plan: Acute kidney injury: Possibly multifactorial including prerenal syndrome/ATN/ACE/contrast induced acute kidney injury.  Presently her creatinine seems to be improving. 2] hypokalemia: Her potassium is declining. 3] hypertension: His systolic blood pressure is low normal.  4] diabetes 5] history of CVA with aphasia 6] hypernatremia: Her sodium has corrected. 7] history of colitis Plan: 1] we will start patient on KCl 10 mEq IV over 1 hour x 3 doses  2] we will start patient on half-normal saline with 40 mEq of KCl at 100 cc/h after finishing a 10 mEq KCl 3 doses. 3] we will DC half-normal saline 4] we will check her renal panel in the morning.   LOS: 3 days   Bueford Arp S 10/16/2017,9:21 AM

## 2017-10-16 NOTE — Progress Notes (Signed)
PROGRESS NOTE    Sandra [name]  WUJ:[id number] DOB: 05/16/1941 DOA: 10/12/2017 PCP: Caren Macadam, MD    Brief Narrative:  77 year old female admitted to the hospital with abdominal pain. She was found to have colitis with possible fecal impaction on admission. 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 Neurology following. She also has worsening renal function. Nephrology consulted.    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 Colitis.  Currently on ciprofloxacin and Flagyl.  No nausea or vomiting.  Continue current treatments. 2. Acute renal failure.  She did receive some IV contrast on admission.  Continue on IV fluids.  Nephrology following.  IV Lasix has been added.  No hydronephrosis on CT scan done on admission.  Renal ultrasound has been ordered.  Creatinine is improving 3. Partial seizures.  Neurology following.  Started on Keppra. 4. Acute encephalopathy, likely metabolic.  Overall appears to be slowly improving.  ABG and ammonia were unremarkable.  Possibly related to dehydration/urinary tract infection 5. Diabetes.  On sliding scale insulin.  Also on Lantus.  Blood sugar stable.  Continue to monitor. 6. Hypotension. Still having episodes of soft blood pressures. Will check cortisol 7. Previous stroke.  She has residual right-sided hemiparesis.  Continue on Plavix.  MRI performed during this hospitalization was poor quality. 8. Possible fecal impaction.  S patient has received enemas.  Follow-up abdominal film does not  show any evidence of impaction 9. UTI. Urinalysis indicates pyuria. Urine culture in process. She is already on ciprofloxacin so culture may be low yield. 10. Hypokalemia.  Replace.  Check magnesium 11. Irregular rhythm.  Noted on telemetry as well as auscultation.  Check twelve-lead EKG.   DVT prophylaxis: Lovenox Code Status: Full code Family Communication: discussed with son on 6/1 Disposition Plan: Return to nursing facility at discharge   Consultants:   Neurology  Nephrology  Procedures:  EEG:1. Frontal intermittent rhythmic delta activities are observed. This is typically seen in toxic metabolic encephalopathies.  2. Triphasic waves are also observed typically seen in metabolic encephalopathies particularly due to hepatic failure or renal failure.  Antimicrobials:   Ciprofloxacin 5/30 >  Flagyl 5/30>   Subjective: Awake, doesn't answer questions or follow commands  Objective: Vitals:   10/16/17 0500 10/16/17 0600 10/16/17 0758 10/16/17 0800  BP: (!) 100/48 101/70 (!) 102/53   Pulse: 72 88 88   Resp: 19 (!) 21 (!) 21   Temp:   98.7 F (37.1 C) 97.9 F (36.6 C)  TempSrc:   Axillary Axillary  SpO2: 96% 95%    Weight:   80.3 kg (177 lb 0.5 oz)   Height:   5\' 3"  (1.6 m)     Intake/Output Summary (Last 24 hours) at 10/16/2017 1045 Last data filed at 10/16/2017 0600 Gross per 24 hour  Intake 4260 ml  Output 250 ml  Net 4010 ml   Filed Weights   10/14/17 0500 10/15/17 0400 10/16/17 0758  Weight: 76.9 kg (169 lb 8.5 oz) 80.3 kg (177 lb  0.5 oz) 80.3 kg (177 lb 0.5 oz)    Examination:  General exam: Alert, awake, no distress Respiratory system: Clear to auscultation. Respiratory effort normal. Cardiovascular system:irregular. No murmurs, rubs, gallops. Gastrointestinal system: Abdomen is nondistended, soft and mildly tender in lower abdomen. No organomegaly or masses felt. Normal bowel sounds heard. Central nervous system: limited exam due to mental status.  Significant aphasia. Does not follow commands Extremities: No C/C/E, +pedal pulses Skin: No rashes, lesions or ulcers Psychiatry: nonverbal    Data Reviewed: I have personally reviewed following labs and imaging studies  CBC: Recent Labs  Lab 10/13/17 0104 10/13/17 0654 10/14/17 0342 10/15/17 0557  WBC 17.5* 14.6* 13.3* 13.0*  NEUTROABS 15.5*  --   --   --   HGB 12.2 11.2* 10.4* 10.2*  HCT 37.8 34.4* 33.1* 32.1*  MCV 82.9 83.1 85.1 83.4  PLT 398 359 312 779   Basic Metabolic Panel: Recent Labs  Lab 10/13/17 0104 10/13/17 0654 10/14/17 0342 10/15/17 0557 10/16/17 0522  NA 146* 146* 145 139 142  K 4.2 3.9 4.6 3.9 2.8*  CL 103 104 110 104 107  CO2 28 30 23 25 26   GLUCOSE 260* 202* 233* 99 181*  BUN 29* 32* 42* 54* 61*  CREATININE 1.70* 1.76* 2.37* 2.71* 2.16*  CALCIUM 8.3* 7.6* 7.3* 7.0* 7.4*  MG  --   --  2.4  --   --   PHOS  --   --   --   --  3.6   GFR: Estimated Creatinine Clearance: 21.6 mL/min (A) (by C-G formula based on SCr of 2.16 mg/dL (H)). Liver Function Tests: Recent Labs  Lab 10/13/17 0104 10/13/17 0654 10/14/17 0342 10/16/17 0522  AST 41 35 43*  --   ALT 18 16 14   --   ALKPHOS 103 89 84  --   BILITOT 0.9 0.7 1.0  --   PROT 6.5 5.8* 5.4*  --   ALBUMIN 2.4* 2.0* 1.8* 1.8*   Recent Labs  Lab 10/13/17 0104  LIPASE 37   Recent Labs  Lab 10/15/17 1130  AMMONIA 30   Coagulation Profile: No results for input(s): INR, PROTIME in the last 168 hours. Cardiac Enzymes: Recent Labs  Lab 10/13/17 0104 10/13/17 0654 10/13/17 1339 10/13/17 1856  TROPONINI 0.03* 0.04* 0.04* 0.05*   BNP (last 3 results) No results for input(s): PROBNP in the last 8760 hours. HbA1C: No results for input(s): HGBA1C in the last 72 hours. CBG: Recent Labs  Lab 10/15/17 0801 10/15/17 1134 10/15/17 1609 10/15/17 2102 10/16/17 0830  GLUCAP 88 103* 139* 203* 211*   Lipid Profile: No results for input(s): CHOL, HDL, LDLCALC, TRIG, CHOLHDL, LDLDIRECT in the  last 72 hours. Thyroid Function Tests: No results for input(s): TSH, T4TOTAL, FREET4, T3FREE, THYROIDAB in the last 72 hours. Anemia Panel: No results for input(s): VITAMINB12, FOLATE, FERRITIN, TIBC, IRON, RETICCTPCT in the last 72 hours. Sepsis Labs: No results for input(s): PROCALCITON, LATICACIDVEN in the last 168 hours.  Recent Results (from the past 240 hour(s))  MRSA PCR Screening     Status: None   Collection Time: 10/13/17  6:06 AM  Result Value Ref Range Status   MRSA by PCR NEGATIVE NEGATIVE Final    Comment:        The GeneXpert MRSA Assay (FDA approved for NASAL specimens only), is one component of a comprehensive MRSA colonization surveillance program. It is not intended to diagnose MRSA infection nor to guide or monitor treatment for MRSA infections. Performed  at [facility], [address], [place], [place] [address]          Radiology Studies: US Renal  Result Date: 10/15/2017 CLINICAL DATA:  Acute renal failure. EXAM: RENAL / URINARY TRACT ULTRASOUND COMPLETE COMPARISON:  CT 10/13/2017 FINDINGS: Right Kidney: Length: Small, measuring 9.2 cm in length. Cortical atrophy. Lobular parenchyma probably secondary to the atrophy. Echogenicity within normal limits. No mass or hydronephrosis visualized. Left Kidney: Length: 10.2 cm. Normal cortical thickness. Echogenicity within normal limits. No mass or hydronephrosis visualized. 5 cm cyst in the midportion. Bladder: The bladder was not visualized and is presumably collapsed. IMPRESSION: No obstruction. Atrophy of the right kidney relative to the left, with small size and lobular contours. Electronically Signed   By: Nelson Chimes M.D.   On: 10/15/2017 12:53   Dg Abd Portable 1v  Result Date: 10/15/2017 CLINICAL DATA:  Possible fecal impaction EXAM: PORTABLE ABDOMEN - 1 VIEW COMPARISON:  None. FINDINGS: Scattered large and small bowel gas is noted. No obstructive changes are seen. No constipation or fecal impaction is  noted. Degenerative changes of lumbar spine are seen. No other focal abnormality is noted. IMPRESSION: No acute abnormality noted. Electronically Signed   By: Inez Catalina M.D.   On: 10/15/2017 18:41        Scheduled Meds: . atorvastatin  40 mg Oral q1800  . bethanechol  25 mg Oral TID  . citalopram  10 mg Oral Daily  . clopidogrel  75 mg Oral Q breakfast  . enoxaparin (LOVENOX) injection  30 mg Subcutaneous Q24H  . feeding supplement (ENSURE ENLIVE)  237 mL Oral BID BM  . furosemide  40 mg Intravenous BID  . insulin aspart  0-9 Units Subcutaneous TID WC  . insulin glargine  10 Units Subcutaneous Daily  . mouth rinse  15 mL Mouth Rinse BID  . senna-docusate  2 tablet Oral QHS  . tamsulosin  0.4 mg Oral QPC supper   Continuous Infusions: . 0.9 % NaCl with KCl 40 mEq / L    . [START ON 10/17/2017] ciprofloxacin    . levETIRAcetam Stopped (10/16/17 0731)  . metronidazole Stopped (10/16/17 6837)  . potassium chloride 10 mEq (10/16/17 1013)     LOS: 3 days    Time spent: 30 minutes    Kathie Dike, MD [facility] Pager [phone]  If 7PM-7AM, please contact night-coverage [email or web address] Password [email or web address] 10/16/2017, 10:45 AM

## 2017-10-17 LAB — GLUCOSE, CAPILLARY
GLUCOSE-CAPILLARY: 181 mg/dL — AB (ref 65–99)
GLUCOSE-CAPILLARY: 125 mg/dL — AB (ref 65–99)
GLUCOSE-CAPILLARY: 89 mg/dL (ref 65–99)
Glucose-Capillary: 142 mg/dL — ABNORMAL HIGH (ref 65–99)

## 2017-10-17 LAB — RENAL FUNCTION PANEL
ALBUMIN: 1.6 g/dL — AB (ref 3.5–5.0)
ANION GAP: 4 — AB (ref 5–15)
BUN: 57 mg/dL — ABNORMAL HIGH (ref 6–20)
CALCIUM: 7.4 mg/dL — AB (ref 8.9–10.3)
CO2: 25 mmol/L (ref 22–32)
Chloride: 112 mmol/L — ABNORMAL HIGH (ref 101–111)
Creatinine, Ser: 1.63 mg/dL — ABNORMAL HIGH (ref 0.44–1.00)
GFR calc non Af Amer: 29 mL/min — ABNORMAL LOW (ref >60)
GFR, EST AFRICAN AMERICAN: 34 mL/min — AB (ref >60)
Glucose, Bld: 182 mg/dL — ABNORMAL HIGH (ref 65–99)
PHOSPHORUS: 2.2 mg/dL — AB (ref 2.5–4.6)
POTASSIUM: 4 mmol/L (ref 3.5–5.1)
SODIUM: 141 mmol/L (ref 135–145)

## 2017-10-17 LAB — CBC
HCT: 30.5 % — ABNORMAL LOW (ref 36.0–46.0)
HEMOGLOBIN: 10 g/dL — AB (ref 12.0–15.0)
MCH: 26.6 pg (ref 26.0–34.0)
MCHC: 32.8 g/dL (ref 30.0–36.0)
MCV: 81.1 fL (ref 78.0–100.0)
PLATELETS: 346 10*3/uL (ref 150–400)
RBC: 3.76 MIL/uL — AB (ref 3.87–5.11)
RDW: 17.4 % — ABNORMAL HIGH (ref 11.5–15.5)
WBC: 11.6 10*3/uL — AB (ref 4.0–10.5)

## 2017-10-17 LAB — CORTISOL-AM, BLOOD: Cortisol - AM: 19 ug/dL (ref 6.7–22.6)

## 2017-10-17 MED ORDER — SODIUM CHLORIDE 0.9 % IV SOLN
INTRAVENOUS | Status: DC
  Administered 2017-10-17 (×2): via INTRAVENOUS

## 2017-10-17 NOTE — Progress Notes (Signed)
Subjective: Interval History: none.  Objective: Vital signs in last 24 hours: Temp:  [97.7 F (36.5 C)-99.1 F (37.3 C)] 98.9 F (37.2 C) (06/03 0755) Pulse Rate:  [80-142] 90 (06/03 0600) Resp:  [15-23] 20 (06/03 0600) BP: (77-116)/(40-66) 99/60 (06/03 0600) SpO2:  [93 %-100 %] 97 % (06/03 0600) Weight:  [80.6 kg (177 lb 11.1 oz)] 80.6 kg (177 lb 11.1 oz) (06/03 0500) Weight change:   Intake/Output from previous day: 06/02 0701 - 06/03 0700 In: 2443.3 [I.V.:1743.3; IV Piggyback:700] Out: 1025 [Urine:1025] Intake/Output this shift: No intake/output data recorded.  Patient is somnolent and barely arousable  Chest is clear to auscultation Heart exam revealed regular rate and rhythm no murmur Extremities no edema  Lab Results: Recent Labs    10/15/17 0557 10/17/17 0534  WBC 13.0* 11.6*  HGB 10.2* 10.0*  HCT 32.1* 30.5*  PLT 329 346   BMET:  Recent Labs    10/16/17 0522 10/17/17 0534  NA 142 141  K 2.8* 4.0  CL 107 112*  CO2 26 25  GLUCOSE 181* 182*  BUN 61* 57*  CREATININE 2.16* 1.63*  CALCIUM 7.4* 7.4*   No results for input([name]): PTH in the last 72 hours. Iron Studies: No results for input([name]): IRON, TIBC, TRANSFERRIN, FERRITIN in the last 72 hours.  Studies/Results: US Renal  Result Date: 10/15/2017 CLINICAL DATA:  Acute renal failure. EXAM: RENAL / URINARY TRACT ULTRASOUND COMPLETE COMPARISON:  CT 10/13/2017 FINDINGS: Right Kidney: Length: Small, measuring 9.2 cm in length. Cortical atrophy. Lobular parenchyma probably secondary to the atrophy. Echogenicity within normal limits. No mass or hydronephrosis visualized. Left Kidney: Length: 10.2 cm. Normal cortical thickness. Echogenicity within normal limits. No mass or hydronephrosis visualized. 5 cm cyst in the midportion. Bladder: The bladder was not visualized and is presumably collapsed. IMPRESSION: No obstruction. Atrophy of the right kidney relative to the left, with small size and lobular contours.  Electronically Signed   By: Nelson Chimes M.D.   On: 10/15/2017 12:53   Dg Abd Portable 1v  Result Date: 10/15/2017 CLINICAL DATA:  Possible fecal impaction EXAM: PORTABLE ABDOMEN - 1 VIEW COMPARISON:  None. FINDINGS: Scattered large and small bowel gas is noted. No obstructive changes are seen. No constipation or fecal impaction is noted. Degenerative changes of lumbar spine are seen. No other focal abnormality is noted. IMPRESSION: No acute abnormality noted. Electronically Signed   By: Inez Catalina M.D.   On: 10/15/2017 18:41    I have reviewed the patient'[name] current medications.  Assessment/Plan: Acute kidney injury: Possibly multifactorial including prerenal syndrome/ATN/ACE/contrast induced acute kidney injury.  Her renal function continue to improve.  Patient presently nonoliguric.  She has about a liter of urine output. 2] hypokalemia: Patient is on potassium supplement and her potassium is normal. 3] hypertension: His systolic blood pressure is low normal.  4] diabetes: Her blood sugar is reasonably controlled 5] history of CVA with aphasia 6] hypernatremia: Her sodium has corrected. 7] history of colitis Plan: 1] we will DC IV fluid with potassium 2] we will start her on normal saline at 75 cc/h 3] we will check her renal panel in the morning. 4] we will continue his Lasix at present dose.   LOS: 4 days   Sandra [name] 10/17/2017,8:58 AM

## 2017-10-17 NOTE — Progress Notes (Signed)
PROGRESS NOTE    Sandra [name]  KKX:[id number] DOB: August 28, 1940 DOA: 10/12/2017 PCP: Caren Macadam, MD    Brief Narrative:  77 year old female admitted to the hospital with abdominal pain. She was found to have colitis with possible fecal impaction on admission. 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 Neurology following. She also has worsening renal function. Nephrology consulted.    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 Colitis.  Currently on ciprofloxacin and Flagyl.  No nausea or vomiting.  Continue current treatments. 2. Acute renal failure.  She did receive some IV contrast on admission.  Continue on IV fluids.  Nephrology following.  Currently on intravenous fluids and IV Lasix.  No hydronephrosis on CT scan done on admission.  Renal ultrasound has been ordered.  Creatinine is improving 3. Partial seizures.  Neurology following.  Started on Keppra. 4. Acute encephalopathy, likely metabolic.  Overall appears to be slowly improving.  ABG and ammonia were unremarkable.  Possibly related to dehydration/urinary tract infection.  At baseline she has aphasia which makes communication difficult. 5. Diabetes.  On sliding scale insulin.  Also on Lantus.  Blood sugar stable.  Continue to monitor. 6. Hypotension. Still having episodes of soft blood pressures.  Cortisol normal 7. Previous stroke.  She has residual right-sided hemiparesis and severe aphasia.  Continue on Plavix.  MRI performed during this hospitalization was poor quality. 8. Possible fecal impaction.   Resolved with enemas and digital disimpaction.  9. UTI. Urinalysis indicates pyuria.  Urine culture showed no growth.  Culture was obtained while patient was already on ciprofloxacin. 10. Hypokalemia.  Replace.  Magnesium elevated. 11. Irregular rhythm.  Noted to be sinus rhythm with PACs. 12. Severe protein calorie malnutrition.  P.o. intake remains poor.  Nutrition following.   DVT prophylaxis: Lovenox Code Status: Full code Family Communication: discussed with son on 6/3 Disposition Plan: Return to nursing facility at discharge   Consultants:   Neurology  Nephrology  Procedures:  EEG:1. Frontal intermittent rhythmic delta activities are observed. This is typically seen in toxic metabolic encephalopathies.  2. Triphasic waves are also observed typically seen in metabolic encephalopathies particularly due to hepatic failure or renal failure.  Antimicrobials:   Ciprofloxacin 5/30 >  Flagyl 5/30>   Subjective: Does not answer questions or follow commands.  She is awake and sitting up in bed.  Objective: Vitals:   10/17/17 0900 10/17/17 1000 10/17/17 1230 10/17/17 1600  BP: (!) 91/47 (!) 97/55    Pulse: 82 85    Resp: 15 (!) 25    Temp:   99.3 F (37.4 C) 98 F (36.7 C)  TempSrc:    Axillary  SpO2: 100% 100%    Weight:      Height:        Intake/Output Summary (Last 24 hours) at 10/17/2017 1750 Last data filed at 10/17/2017 1749 Gross per 24 hour  Intake 3350.83 ml  Output 1125 ml  Net 2225.83 ml   Filed Weights   10/15/17 0400 10/16/17 0758 10/17/17 0500  Weight: 80.3  kg (177 lb 0.5 oz) 80.3 kg (177 lb 0.5 oz) 80.6 kg (177 lb 11.1 oz)    Examination:  General exam: Alert, awake, does not follow commands Respiratory system: Clear to auscultation. Respiratory effort normal. Cardiovascular system: Sinus rhythm with PACs. No murmurs, rubs, gallops. Gastrointestinal system: Abdomen is nondistended, soft and mildly tender in lower abdomen. No organomegaly or  masses felt. Normal bowel sounds heard. Central nervous system: Limited exam due to patient's mental status. Extremities: No C/C/E, +pedal pulses Skin: No rashes, lesions or ulcers Psychiatry: Nonverbal  Data Reviewed: I have personally reviewed following labs and imaging studies  CBC: Recent Labs  Lab 10/13/17 0104 10/13/17 0654 10/14/17 0342 10/15/17 0557 10/17/17 0534  WBC 17.5* 14.6* 13.3* 13.0* 11.6*  NEUTROABS 15.5*  --   --   --   --   HGB 12.2 11.2* 10.4* 10.2* 10.0*  HCT 37.8 34.4* 33.1* 32.1* 30.5*  MCV 82.9 83.1 85.1 83.4 81.1  PLT 398 359 312 329 616   Basic Metabolic Panel: Recent Labs  Lab 10/13/17 0654 10/14/17 0342 10/15/17 0557 10/16/17 0522 10/17/17 0534  NA 146* 145 139 142 141  K 3.9 4.6 3.9 2.8* 4.0  CL 104 110 104 107 112*  CO2 30 23 25 26 25   GLUCOSE 202* 233* 99 181* 182*  BUN 32* 42* 54* 61* 57*  CREATININE 1.76* 2.37* 2.71* 2.16* 1.63*  CALCIUM 7.6* 7.3* 7.0* 7.4* 7.4*  MG  --  2.4  --  2.9*  --   PHOS  --   --   --  3.6 2.2*   GFR: Estimated Creatinine Clearance: 28.6 mL/min (A) (by C-G formula based on SCr of 1.63 mg/dL (H)). Liver Function Tests: Recent Labs  Lab 10/13/17 0104 10/13/17 0654 10/14/17 0342 10/16/17 0522 10/17/17 0534  AST 41 35 43*  --   --   ALT 18 16 14   --   --   ALKPHOS 103 89 84  --   --   BILITOT 0.9 0.7 1.0  --   --   PROT 6.5 5.8* 5.4*  --   --   ALBUMIN 2.4* 2.0* 1.8* 1.8* 1.6*   Recent Labs  Lab 10/13/17 0104  LIPASE 37   Recent Labs  Lab 10/15/17 1130  AMMONIA 30   Coagulation Profile: No results for input(s): INR, PROTIME in the last 168 hours. Cardiac Enzymes: Recent Labs  Lab 10/13/17 0104 10/13/17 0654 10/13/17 1339 10/13/17 1856  TROPONINI 0.03* 0.04* 0.04* 0.05*   BNP (last 3 results) No results for input(s): PROBNP in the last 8760 hours. HbA1C: No results for input(s): HGBA1C in the last 72 hours. CBG: Recent Labs  Lab 10/16/17 1608 10/16/17 2131 10/17/17 0725  10/17/17 1157 10/17/17 1617  GLUCAP 139* 170* 181* 125* 89   Lipid Profile: No results for input(s): CHOL, HDL, LDLCALC, TRIG, CHOLHDL, LDLDIRECT in the last 72 hours. Thyroid Function Tests: No results for input(s): TSH, T4TOTAL, FREET4, T3FREE, THYROIDAB in the last 72 hours. Anemia Panel: No results for input(s): VITAMINB12, FOLATE, FERRITIN, TIBC, IRON, RETICCTPCT in the last 72 hours. Sepsis Labs: No results for input(s): PROCALCITON, LATICACIDVEN in the last 168 hours.  Recent Results (from the past 240 hour(s))  MRSA PCR Screening     Status: None   Collection Time: 10/13/17  6:06 AM  Result Value Ref Range Status   MRSA by PCR NEGATIVE NEGATIVE Final    Comment:        The GeneXpert MRSA Assay (FDA approved  for NASAL specimens only), is one component of a comprehensive MRSA colonization surveillance program. It is not intended to diagnose MRSA infection nor to guide or monitor treatment for MRSA infections. Performed at [facility], [address] [place], [place] [address]   Culture, Urine     Status: None   Collection Time: 10/15/17 10:00 AM  Result Value Ref Range Status   Specimen Description   Final    URINE, CLEAN CATCH Performed at [facility], [address], [place], [place] [address]    Special Requests   Final    NONE Performed at [facility], [address] [place], [place] [address]    Culture   Final    NO GROWTH Performed at [facility], [place] [address], [place], [place] [address]    Report Status 10/16/2017 FINAL  Final         Radiology Studies: No results found.      Scheduled Meds: . atorvastatin  40 mg Oral q1800  . bethanechol  25 mg Oral TID  . citalopram  10 mg Oral Daily  . clopidogrel  75 mg Oral Q breakfast  . enoxaparin (LOVENOX) injection  30 mg Subcutaneous Q24H  . feeding supplement (ENSURE ENLIVE)  237 mL Oral BID BM  . furosemide  40 mg Intravenous BID  . insulin aspart  0-9 Units Subcutaneous TID WC    . insulin glargine  10 Units Subcutaneous Daily  . mouth rinse  15 mL Mouth Rinse BID  . senna-docusate  2 tablet Oral QHS  . tamsulosin  0.4 mg Oral QPC supper   Continuous Infusions: . sodium chloride 75 mL/hr at 10/17/17 0910  . ciprofloxacin Stopped (10/17/17 0814)  . levETIRAcetam Stopped (10/17/17 0723)  . metronidazole Stopped (10/17/17 1410)     LOS: 4 days    Time spent: 30 minutes    Kathie Dike, MD [facility] Pager [phone]  If 7PM-7AM, please contact night-coverage [email or web address] Password [email or web address] 10/17/2017, 5:50 PM

## 2017-10-17 NOTE — Care Management Important Message (Signed)
Important Message  Patient Details  Name: Sandra [name] MRN: [id number] Date of Birth: [birth year]   Medicare Important Message Given:  Yes    [name] 10/17/2017, 2:23 PM

## 2017-10-17 NOTE — Clinical Social Work Note (Signed)
Clinical Social Work Assessment  Patient Details  Name: Sandra [name] MRN: [id number] Date of Birth: 1940-07-17  Date of referral:  10/17/17               Reason for consult:  Discharge Planning                Permission sought to share information with:  Facility Art therapist granted to share information::  Yes, Verbal Permission Granted  Name::        Agency::  [facility]  Relationship::     Contact Information:     Housing/Transportation Living arrangements for the past 2 months:  [place] of Information:  Facility Patient Interpreter Needed:  None Criminal Activity/Legal Involvement Pertinent to Current Situation/Hospitalization:  No - Comment as needed Significant Relationships:  Adult Children Lives with:  Facility Resident Do you feel safe going back to the place where you live?  Yes Need for family participation in patient care:  Yes (Comment)  Care giving concerns: Pt is in short term rehab at [facility].   Social Worker assessment / plan: Pt is a 77 year old female admitted from [facility] short term rehab unit. Spoke with pt's son earlier this admission about dc planning. At this time, plan is for return to [facility] at dc.   Employment status:  Retired Forensic scientist:  Medicare PT Recommendations:  [place] / Referral to community resources:     Patient/Family's Response to care: Pt non verbal after her CVA. Pt accepting of care.  Patient/Family's Understanding of and Emotional Response to Diagnosis, Current Treatment, and Prognosis: Unable to assess pt's understanding/response. Family appear to have a good understanding of pt's diagnosis and treatment recommendations. Emotional support provided to pt's son.  Emotional Assessment Appearance:  Appears stated age Attitude/Demeanor/Rapport:    Affect (typically observed):  Calm Orientation:  Oriented to Self Alcohol / Substance use:    Psych  involvement (Current and /or in the community):  No (Comment)  Discharge Needs  Concerns to be addressed:  Discharge Planning Concerns Readmission within the last 30 days:  No Current discharge risk:  None Barriers to Discharge:  No Barriers Identified   Shade Flood, LCSW 10/17/2017, 11:07 AM

## 2017-10-17 NOTE — Progress Notes (Signed)
Upon doing shift assessment, noticed IV in Left AC was swollen. Pt grimaced and cried out upon touching IV site. IV was discontinued and fluids started in remaining IV access site. Left AC was edematous with blister on skin and clear fluid leaking from IV site. Covered lightly with vaseline gauze and kerlex.

## 2017-10-17 NOTE — Progress Notes (Signed)
[place] A. Merlene Laughter, MD     [email or web address]          Sandra [name] is an 77 y.o. female.   ASSESSMENT/PLAN:  1.  Altered mental status: This is most likely multifactorial including baseline severe global aphasia and acute medical illness.  Particularly today, she has elevated creatinine and initiation of Keppra may also have contributed to the encephalopathy.    2.  Previous large left MCA infarct: Risk factors diabetes, hypertension and age.  Continue with aspirin 325.  3.  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 We will continue with her current dose of Keppra for now.       The patient is much more alert today.  No evidence of clonic activity is noted.   GENERAL: She is resting well in bed and in no acute distress.  HEENT: Neck is supple and without evidence of trauma.  ABDOMEN: soft  EXTREMITIES: No edema ; no clonic activities are noted on the right side   BACK: Normal  SKIN: Normal by inspection.    MENTAL STATUS:   She is sitting in bed being attended by the aide.  She is mute.  She does not appears to be in discomfort.  CRANIAL NERVES: Pupils are equal, round and reactive to light and accomodation; extra ocular movements are full, there is no significant nystagmus; visual fields -reveals a dense right homonymous hemianopia; upper and lower facial muscles are normal in strength and symmetric, there is no flattening of the nasolabial folds; tongue is midline; uvula is midline; shoulder elevation is normal.  MOTOR: She moves the right side much less than the left side.  She has at least antigravity strength on the left side.  Bulk and tone seems normal throughout.  COORDINATION: She has continuous flexion contraction of the right upper extremity including the elbow and shoulder.  SENSATION: She responds to pain bilaterally but less on the right side.        NEURO  NOTE 06-16-2017 1. Subacute left hemispheric stroke: Risk factors hypertension, diabetes, age, peripheral vascular disease and dyslipidemia. I suspect that the etiology is from intracranial thromboembolic phenomena but the MRA is significantly degraded by motion artifact. Consequent, repeat imaging with a head CTA will be obtained.I recommend dual antiplatelet agents aspirin/Plavix for 3 months. Subsequently, single agent can be used. I think we should increase the patient's statin given her LDL 150.     Patient is an 77 year old white female who presents with that two week history of apparent dysarthria. She apparently has not been taking her baseline medications for the past three weeks. She also appears to have had some language impairment possibly based upon the evaluation by the admitting physician and the emergency room physician. The language impairment seems mostly difficulty understanding over the last couple of days. The review of systems is not obtainable given the patient'pairment.         Blood pressure (!) 97/55, pulse 85, temperature 98 F (36.7 C), temperature source Axillary, resp. rate (!) 25, height 5' 3"  (1.6 m), weight 177 lb 11.1 oz (80.6 kg), SpO2 100 %.  Past Medical History:  Diagnosis Date  . Colon polyp   . Constipation   . Diabetes mellitus without complication ([place])   . GI bleed   . Hemorrhoids   . High cholesterol   . Hypertension   . PAD (peripheral artery disease) ([place])   . Peripheral neuropathy   . Rectal bleeding    "  for 7 years"  . Rectal bleeding   . Stroke [facility])     Past Surgical History:  Procedure Laterality Date  . ABDOMINAL HYSTERECTOMY    . COLONOSCOPY     X 7  . COLONOSCOPY N/A 03/21/2017   Procedure: COLONOSCOPY;  Surgeon: Danie Binder, MD;  Location: [facility];  Service: Endoscopy;  Laterality: N/A;  1215     Family History  Problem Relation Age of Onset  . Diabetes Mother   . Cancer Father   . Diabetes  Sister   . Diabetes Brother   . Diabetes Daughter   . Diabetes Maternal Grandmother   . Diabetes Daughter   . Colon cancer Neg Hx     Social History:  reports that she has never smoked. She has never used smokeless tobacco. She reports that she does not drink alcohol or use drugs.  Allergies:  Allergies  Allergen Reactions  . Hctz [Hydrochlorothiazide] Other (See Comments)    Caused pancreatitis flare up  . Morphine And Related Other (See Comments)    dizziness  . Valium [Diazepam] Other (See Comments)    Confusion, blurred vision    Medications: Prior to Admission medications   Medication Sig Start Date End Date Taking? Authorizing Provider  amLODipine (NORVASC) 10 MG tablet Take 1 tablet (10 mg total) by mouth daily. 07/14/17  Yes Love, Ivan Anchors, PA-C  aspirin 325 MG EC tablet Take 1 tablet (325 mg total) by mouth daily. 07/14/17  Yes Love, Ivan Anchors, PA-C  atorvastatin (LIPITOR) 40 MG tablet Take 1 tablet (40 mg total) by mouth daily at 6 PM. 06/20/17  Yes Johnson, Clanford L, MD  bethanechol (URECHOLINE) 25 MG tablet Take 1 tablet (25 mg total) by mouth 3 (three) times daily. 07/14/17  Yes Love, Ivan Anchors, PA-C  bisacodyl (DULCOLAX) 10 MG suppository Place 10 mg rectally once as needed for moderate constipation.   Yes [provider]  citalopram (CELEXA) 10 MG tablet Take 10 mg by mouth daily.   Yes [provider]  clopidogrel (PLAVIX) 75 MG tablet Take 1 tablet (75 mg total) by mouth daily with breakfast. 06/21/17  Yes Johnson, Clanford L, MD  collagenase (SANTYL) ointment Apply 1 application topically daily.   Yes [provider]  gabapentin (NEURONTIN) 100 MG capsule Take 100 mg by mouth 3 (three) times daily.   Yes [provider]  insulin glargine (LANTUS) 100 UNIT/ML injection Inject 0.35 mLs (35 Units total) into the skin daily. Patient taking differently: Inject 10 Units into the skin daily.  07/14/17  Yes Love, Ivan Anchors, PA-C  metFORMIN  (GLUCOPHAGE) 500 MG tablet Take by mouth 3 (three) times daily.   Yes [provider]  Multiple Vitamins-Minerals (ONE-A-DAY 50 PLUS PO) Take 1 tablet by mouth daily.   Yes [provider]  senna-docusate (SENOKOT-S) 8.6-50 MG tablet Take 2 tablets by mouth at bedtime. Patient taking differently: Take 2 tablets by mouth daily.  07/14/17  Yes Love, Ivan Anchors, PA-C  tamsulosin (FLOMAX) 0.4 MG CAPS capsule Take 1 capsule (0.4 mg total) by mouth daily after supper. 07/14/17  Yes Love, Ivan Anchors, PA-C  traMADol (ULTRAM) 50 MG tablet Take 50 mg by mouth every 6 (six) hours as needed.   Yes [provider]  vitamin B-12 (CYANOCOBALAMIN) 1000 MCG tablet Take 1,000 mcg by mouth daily.   Yes [provider]  acetaminophen (TYLENOL) 325 MG tablet Take 1-2 tablets (325-650 mg total) by mouth every 4 (four) hours as  needed for mild pain. 07/14/17   Bary Leriche, PA-C    Scheduled Meds: . atorvastatin  40 mg Oral q1800  . bethanechol  25 mg Oral TID  . citalopram  10 mg Oral Daily  . clopidogrel  75 mg Oral Q breakfast  . enoxaparin (LOVENOX) injection  30 mg Subcutaneous Q24H  . feeding supplement (ENSURE ENLIVE)  237 mL Oral BID BM  . furosemide  40 mg Intravenous BID  . insulin aspart  0-9 Units Subcutaneous TID WC  . insulin glargine  10 Units Subcutaneous Daily  . mouth rinse  15 mL Mouth Rinse BID  . senna-docusate  2 tablet Oral QHS  . tamsulosin  0.4 mg Oral QPC supper   Continuous Infusions: . sodium chloride 75 mL/hr at 10/17/17 0910  . ciprofloxacin Stopped (10/17/17 6546)  . levETIRAcetam Stopped (10/17/17 0723)  . metronidazole Stopped (10/17/17 1410)   PRN Meds:.acetaminophen, ondansetron **OR** ondansetron (ZOFRAN) IV     Results for orders placed or performed during the hospital encounter of 10/12/17 (from the past 48 hour(s))  Glucose, capillary     Status: Abnormal   Collection Time: 10/15/17  9:02 PM  Result Value Ref Range    Glucose-Capillary 203 (H) 65 - 99 mg/dL  Renal function panel     Status: Abnormal   Collection Time: 10/16/17  5:22 AM  Result Value Ref Range   Sodium 142 135 - 145 mmol/L   Potassium 2.8 (L) 3.5 - 5.1 mmol/L    Comment: DELTA CHECK NOTED   Chloride 107 101 - 111 mmol/L   CO2 26 22 - 32 mmol/L   Glucose, Bld 181 (H) 65 - 99 mg/dL   BUN 61 (H) 6 - 20 mg/dL   Creatinine, Ser 2.16 (H) 0.44 - 1.00 mg/dL   Calcium 7.4 (L) 8.9 - 10.3 mg/dL   Phosphorus 3.6 2.5 - 4.6 mg/dL   Albumin 1.8 (L) 3.5 - 5.0 g/dL   GFR calc non Af Amer 21 (L) >60 mL/min   GFR calc Af Amer 24 (L) >60 mL/min    Comment: (NOTE) The eGFR has been calculated using the CKD EPI equation. This calculation has not been validated in all clinical situations. eGFR's persistently <60 mL/min signify possible Chronic Kidney Disease.    Anion gap 9 5 - 15    Comment: Performed at [facility], [address], [place], [place] [address]  Magnesium     Status: Abnormal   Collection Time: 10/16/17  5:22 AM  Result Value Ref Range   Magnesium 2.9 (H) 1.7 - 2.4 mg/dL    Comment: Performed at [facility], [address], [place], [place] [address]  Glucose, capillary     Status: Abnormal   Collection Time: 10/16/17  8:30 AM  Result Value Ref Range   Glucose-Capillary 211 (H) 65 - 99 mg/dL  Glucose, capillary     Status: Abnormal   Collection Time: 10/16/17 11:40 AM  Result Value Ref Range   Glucose-Capillary 203 (H) 65 - 99 mg/dL  Glucose, capillary     Status: Abnormal   Collection Time: 10/16/17  4:08 PM  Result Value Ref Range   Glucose-Capillary 139 (H) 65 - 99 mg/dL  Glucose, capillary     Status: Abnormal   Collection Time: 10/16/17  9:31 PM  Result Value Ref Range   Glucose-Capillary 170 (H) 65 - 99 mg/dL  Renal function panel     Status: Abnormal   Collection Time: 10/17/17  5:34 AM  Result Value Ref Range   Sodium 141 135 - 145 mmol/L   Potassium 4.0 3.5 - 5.1 mmol/L    Comment: DELTA CHECK NOTED   Chloride  112 (H) 101 - 111 mmol/L   CO2 25 22 - 32 mmol/L   Glucose, Bld 182 (H) 65 - 99 mg/dL   BUN 57 (H) 6 - 20 mg/dL   Creatinine, Ser 1.63 (H) 0.44 - 1.00 mg/dL   Calcium 7.4 (L) 8.9 - 10.3 mg/dL   Phosphorus 2.2 (L) 2.5 - 4.6 mg/dL   Albumin 1.6 (L) 3.5 - 5.0 g/dL   GFR calc non Af Amer 29 (L) >60 mL/min   GFR calc Af Amer 34 (L) >60 mL/min    Comment: (NOTE) The eGFR has been calculated using the CKD EPI equation. This calculation has not been validated in all clinical situations. eGFR's persistently <60 mL/min signify possible Chronic Kidney Disease.    Anion gap 4 (L) 5 - 15    Comment: Performed at [facility], [address], [place], [place] [address]  Cortisol-am, blood     Status: None   Collection Time: 10/17/17  5:34 AM  Result Value Ref Range   Cortisol - AM 19.0 6.7 - 22.6 ug/dL    Comment: Performed at [facility], [place] [address], [place], [place] [address]  CBC     Status: Abnormal   Collection Time: 10/17/17  5:34 AM  Result Value Ref Range   WBC 11.6 (H) 4.0 - 10.5 K/uL    Comment: WHITE COUNT CONFIRMED ON SMEAR   RBC 3.76 (L) 3.87 - 5.11 MIL/uL   Hemoglobin 10.0 (L) 12.0 - 15.0 g/dL   HCT 30.5 (L) 36.0 - 46.0 %   MCV 81.1 78.0 - 100.0 fL   MCH 26.6 26.0 - 34.0 pg   MCHC 32.8 30.0 - 36.0 g/dL   RDW 17.4 (H) 11.5 - 15.5 %   Platelets 346 150 - 400 K/uL    Comment: Performed at [facility], [address], [place], [place] [address]  Glucose, capillary     Status: Abnormal   Collection Time: 10/17/17  7:25 AM  Result Value Ref Range   Glucose-Capillary 181 (H) 65 - 99 mg/dL   Comment 1 Notify RN   Glucose, capillary     Status: Abnormal   Collection Time: 10/17/17 11:57 AM  Result Value Ref Range   Glucose-Capillary 125 (H) 65 - 99 mg/dL   Comment 1 Notify RN   Glucose, capillary     Status: None   Collection Time: 10/17/17  4:17 PM  Result Value Ref Range   Glucose-Capillary 89 65 - 99 mg/dL   Comment 1 Notify RN    Comment 2 Document in Chart       Studies/Results:     Chanon Loney A. Merlene Laughter, M.D.  Diplomate, Tax adviser of Psychiatry and Neurology ( Neurology). 10/17/2017, 5:44 PM

## 2017-10-18 LAB — RENAL FUNCTION PANEL
Albumin: 1.7 g/dL — ABNORMAL LOW (ref 3.5–5.0)
Anion gap: 4 — ABNORMAL LOW (ref 5–15)
BUN: 48 mg/dL — AB (ref 6–20)
CALCIUM: 7.2 mg/dL — AB (ref 8.9–10.3)
CO2: 24 mmol/L (ref 22–32)
Chloride: 116 mmol/L — ABNORMAL HIGH (ref 101–111)
Creatinine, Ser: 1.1 mg/dL — ABNORMAL HIGH (ref 0.44–1.00)
GFR, EST AFRICAN AMERICAN: 54 mL/min — AB (ref >60)
GFR, EST NON AFRICAN AMERICAN: 47 mL/min — AB (ref >60)
Glucose, Bld: 91 mg/dL (ref 65–99)
Phosphorus: 2 mg/dL — ABNORMAL LOW (ref 2.5–4.6)
Potassium: 4 mmol/L (ref 3.5–5.1)
SODIUM: 144 mmol/L (ref 135–145)

## 2017-10-18 LAB — GLUCOSE, CAPILLARY
GLUCOSE-CAPILLARY: 96 mg/dL (ref 65–99)
GLUCOSE-CAPILLARY: 46 mg/dL — AB (ref 65–99)
GLUCOSE-CAPILLARY: 175 mg/dL — AB (ref 65–99)
Glucose-Capillary: 115 mg/dL — ABNORMAL HIGH (ref 65–99)
Glucose-Capillary: 149 mg/dL — ABNORMAL HIGH (ref 65–99)

## 2017-10-18 MED ORDER — LEVETIRACETAM 500 MG PO TABS
500.0000 mg | ORAL_TABLET | Freq: Two times a day (BID) | ORAL | Status: DC
  Administered 2017-10-18 – 2017-10-22 (×9): 500 mg via ORAL
  Filled 2017-10-18 (×9): qty 1

## 2017-10-18 MED ORDER — INSULIN GLARGINE 100 UNIT/ML ~~LOC~~ SOLN
5.0000 [IU] | Freq: Every day | SUBCUTANEOUS | Status: DC
  Filled 2017-10-18: qty 0.05

## 2017-10-18 MED ORDER — DEXTROSE 50 % IV SOLN
INTRAVENOUS | Status: AC
  Administered 2017-10-18: 50 mL
  Filled 2017-10-18: qty 50

## 2017-10-18 MED ORDER — ENOXAPARIN SODIUM 40 MG/0.4ML ~~LOC~~ SOLN
40.0000 mg | SUBCUTANEOUS | Status: DC
  Administered 2017-10-18 – 2017-10-29 (×10): 40 mg via SUBCUTANEOUS
  Filled 2017-10-18 (×10): qty 0.4

## 2017-10-18 MED ORDER — CIPROFLOXACIN IN D5W 400 MG/200ML IV SOLN
400.0000 mg | Freq: Two times a day (BID) | INTRAVENOUS | Status: DC
  Administered 2017-10-18 – 2017-10-20 (×4): 400 mg via INTRAVENOUS
  Filled 2017-10-18 (×4): qty 200

## 2017-10-18 NOTE — Progress Notes (Signed)
Nutrition Follow-up  DOCUMENTATION CODES:  Pt meets criteria for acute severe MALNUTRITION in the context of  Acute colitis, AKI, obstipation as evidenced by documented and reported oral intake 0-25% of meals >5 days, and mild to moderate muscle loss.  INTERVENTION:  Initiate a 24-hr calorie count starting today at dinner. Nutrition services staff and NT have been notified as well as Therapist, sports.  RD will follow up tomorrow and make further recommendations once data is evaluated.   Discontinue Ensure Enlive and send Magic cup with meals.    NUTRITION DIAGNOSIS:  Severe acute malnutrition related to colitis, AKI, obstipation as evidenced by documented and reported poor food and fluid acceptance-oral intake 0-25% of meals >5 days. Mild muscle loss clavicles, temples and legs.   GOAL:   Patient will meet greater than or equal to 90% of their needs   MONITOR:   PO intake, Supplement acceptance, Weight trends  REASON FOR ASSESSMENT:   Poor po intake   ASSESSMENT:   77 y/o female with Hx of constipation, DM2, GI bleed, high cholesterol, HTN, rectal bleeding, and stroke with global aphasia.  Pt is nonverbal.  Admitted for abdominal pain and vomiting.  Low Braden of 12. Fecal impaction on admission. Food intake fair and poor fluid intake at SNF 2 weeks leading up to hospital admission.  Minimal po intake 0-10% of meals since admission on 5/29. Discussed prior intake with son. Patient on a Mechanical diet at facility. Patient has poor acceptance of the Ensure Enlive.  Talked with nurse and NT.  At lunch today pt ate 3-4 bites of meat and 100% of Magic Cup (290 kcal, 9 gr protein). Fed by staff to maximize intake. Based on reported intake pt has only consumed approximately <500 kcal today and only 9 gr protein. Will start a calorie count this evening to more fully assess actual intake.   Current wt 180 lb reflects a significant gain of 10 lb since admission. Admit wt 169.5 lb. UBW around 200 lb  range before stroke. Physical exam shows mild muscle depletion to clavicles, Moderate loss to deltoids, patellar and quadriceps.  Intake/Output Summary (Last 24 hours) at 10/18/2017 1315 Last data filed at 10/18/2017 0400 Gross per 24 hour  Intake 1932.5 ml  Output 1600 ml  Net 332.5 ml   Labs: Albumin 1.7 (low). Cr. trending down. BMP Latest Ref Rng & Units 10/18/2017 10/17/2017 10/16/2017  Glucose 65 - 99 mg/dL 91 182(H) 181(H)  BUN 6 - 20 mg/dL 48(H) 57(H) 61(H)  Creatinine 0.44 - 1.00 mg/dL 1.10(H) 1.63(H) 2.16(H)  BUN/Creat Ratio 6 - 22 (calc) - - -  Sodium 135 - 145 mmol/L 144 141 142  Potassium 3.5 - 5.1 mmol/L 4.0 4.0 2.8(L)  Chloride 101 - 111 mmol/L 116(H) 112(H) 107  CO2 22 - 32 mmol/L 24 25 26   Calcium 8.9 - 10.3 mg/dL 7.2(L) 7.4(L) 7.4(L)     Diet Order:   Diet Order           DIET DYS 2 Room service appropriate? Yes; Fluid consistency: Thin  Diet effective now         EDUCATION NEEDS:  No education needs have been identified at this time  Skin:  Stage II to right ankle and MASD   Last BM: 10/16/17 (type 7)  Height:   Ht Readings from Last 1 Encounters:  10/16/17 5\' 3"  (1.6 m)    Weight:   Wt Readings from Last 1 Encounters:  10/18/17 180 lb 1.9 oz (81.7 kg)  Ideal Body Weight:     BMI:  Body mass index is 31.91 kg/m.  Estimated Nutritional Needs:   Kcal:  1540-1920 kcal (20-25 kcal/kg BW)  Protein:  77-92 g (1-1.2 g/kg BW)  Fluid:  >/=1920 mL (>/= 25 mL/kg BW)   Colman Cater MS,RD,CSG,LDN Office: [phone] Pager: [phone]

## 2017-10-18 NOTE — Progress Notes (Addendum)
Hypoglycemic Event  CBG: 47  Treatment: attempting to feed, 1 amp D50 given   Symptoms: none   Follow-up CBG: Time: 1215 CBG Result:115  Possible Reasons for Event: pt not eating. Minimal to no oral intake.  Comments/MD notified:Dr. Memom    Carroll Sage

## 2017-10-18 NOTE — Progress Notes (Signed)
PROGRESS NOTE    Sandra [name]  JKK:[id number] DOB: [birth year] DOA: 10/12/2017 PCP: Caren Macadam, MD    Brief Narrative:  77 year old female admitted to the hospital with abdominal pain. She was found to have colitis with possible fecal impaction on admission. 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 Neurology following. She also has worsening renal function. Nephrology consulted.  Overall renal function has improved.  Mental status is also improving, although p.o. intake remains poor.   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 Colitis.  Currently on ciprofloxacin and Flagyl.  No nausea or vomiting.  Continue current treatments. 2. Acute renal failure.  She did receive some IV contrast on admission.  Nephrology following. She has been treated with IV fluids and IV lasix.  No hydronephrosis on CT scan done on admission.  Renal ultrasound has been ordered.  Creatinine has improved. Lasix has been discontinued. Will discontinue further Iv fluids since she is developing anasarca 3. Partial seizures.  Neurology following.  Started on Keppra with resolution of seizures 4. Acute encephalopathy, likely metabolic.  Overall appears to be slowly improving.  ABG and ammonia were unremarkable.  Possibly related to dehydration/urinary tract infection.  At baseline she has aphasia which makes communication difficult. Based on notes, she appears to be more interactive with other staff members 5. Diabetes.  On sliding scale insulin.  Also on Lantus.  Blood sugar has been running low.  Will decrease lantus dosing. 6. Hypotension. Still having episodes of soft blood pressures.  Cortisol normal 7. Previous stroke.  She has residual right-sided hemiparesis and global aphasia.  Continue on Plavix.  MRI performed during this hospitalization was poor quality. 8. Possible fecal impaction.  Resolved with enemas and digital disimpaction.  9. UTI. Urinalysis indicates pyuria.  Urine culture showed no growth.  Culture was obtained while patient was already on ciprofloxacin. 10. Hypokalemia.  Replace.  Magnesium elevated. 11. Irregular rhythm.  Noted to be sinus rhythm with PACs. 12. Severe protein calorie malnutrition.  P.o. intake remains poor.  Nutrition following. Calorie count has been initiated   DVT prophylaxis: Lovenox Code Status: Full code Family Communication: discussed with son on 6/3 Disposition Plan: Return to nursing facility at discharge. Barrier to discharge is po intake. If po intake improves, then she can be discharged to SNF. If po intake remains minimal, then she will likely need palliative care consult.   Consultants:   Neurology  Nephrology  Procedures:  EEG:1. Frontal intermittent rhythmic delta activities are observed. This is typically seen in toxic metabolic encephalopathies.  2. Triphasic waves are also observed typically seen in metabolic encephalopathies particularly due to hepatic failure or renal failure.  Antimicrobials:   Ciprofloxacin 5/30 >  Flagyl 5/30>   Subjective: Sitting up in bed. Does not answer questions  Objective: Vitals:   10/18/17 1100 10/18/17 1200 10/18/17 1300 10/18/17 1400  BP:      Pulse: 92 (!) 101 (!) 103 (!) 110  Resp: (!) 29 (!) 22 (!) 24 20  Temp:  TempSrc:      SpO2: 97% 99% 99% 100%  Weight:      Height:        Intake/Output Summary (Last 24 hours) at 10/18/2017 1646 Last data filed at 10/18/2017 1500 Gross per 24 hour  Intake 2725 ml  Output 1000 ml  Net 1725 ml   Filed Weights   10/16/17  0758 10/17/17 0500 10/18/17 0400  Weight: 80.3 kg (177 lb 0.5 oz) 80.6 kg (177 lb 11.1 oz) 81.7 kg (180 lb 1.9 oz)    Examination:  General exam: Alert, awake, nonverbal Respiratory system: Clear to auscultation. Respiratory effort normal. Cardiovascular system:RRR. No murmurs, rubs, gallops. Gastrointestinal system: Abdomen is nondistended, soft and nontender. No organomegaly or masses felt. Normal bowel sounds heard. Central nervous system: unable to assess due to mental status. Extremities: developing anasarca Skin: No rashes, lesions or ulcers Psychiatry: nonverbal    Data Reviewed: I have personally reviewed following labs and imaging studies  CBC: Recent Labs  Lab 10/13/17 0104 10/13/17 0654 10/14/17 0342 10/15/17 0557 10/17/17 0534  WBC 17.5* 14.6* 13.3* 13.0* 11.6*  NEUTROABS 15.5*  --   --   --   --   HGB 12.2 11.2* 10.4* 10.2* 10.0*  HCT 37.8 34.4* 33.1* 32.1* 30.5*  MCV 82.9 83.1 85.1 83.4 81.1  PLT 398 359 312 329 213   Basic Metabolic Panel: Recent Labs  Lab 10/14/17 0342 10/15/17 0557 10/16/17 0522 10/17/17 0534 10/18/17 0425  NA 145 139 142 141 144  K 4.6 3.9 2.8* 4.0 4.0  CL 110 104 107 112* 116*  CO2 23 25 26 25 24   GLUCOSE 233* 99 181* 182* 91  BUN 42* 54* 61* 57* 48*  CREATININE 2.37* 2.71* 2.16* 1.63* 1.10*  CALCIUM 7.3* 7.0* 7.4* 7.4* 7.2*  MG 2.4  --  2.9*  --   --   PHOS  --   --  3.6 2.2* 2.0*   GFR: Estimated Creatinine Clearance: 42.7 mL/min (A) (by C-G formula based on SCr of 1.1 mg/dL (H)). Liver Function Tests: Recent Labs  Lab 10/13/17 0104 10/13/17 0654 10/14/17 0342 10/16/17 0522 10/17/17 0534 10/18/17 0425  AST 41 35 43*  --   --   --   ALT 18 16 14   --   --   --   ALKPHOS 103 89 84  --   --   --   BILITOT 0.9 0.7 1.0  --   --   --   PROT 6.5 5.8* 5.4*  --   --   --   ALBUMIN 2.4* 2.0* 1.8* 1.8* 1.6* 1.7*   Recent Labs  Lab 10/13/17 0104  LIPASE 37   Recent Labs  Lab 10/15/17 1130  AMMONIA 30    Coagulation Profile: No results for input(s): INR, PROTIME in the last 168 hours. Cardiac Enzymes: Recent Labs  Lab 10/13/17 0104 10/13/17 0654 10/13/17 1339 10/13/17 1856  TROPONINI 0.03* 0.04* 0.04* 0.05*   BNP (last 3 results) No results for input(s): PROBNP in the last 8760 hours. HbA1C: No results for input(s): HGBA1C in the last 72 hours. CBG: Recent Labs  Lab 10/17/17 1617 10/17/17 2056 10/18/17 0729 10/18/17 1132 10/18/17 1222  GLUCAP 89 142* 96 46* 115*   Lipid Profile: No results for input(s): CHOL, HDL, LDLCALC, TRIG, CHOLHDL, LDLDIRECT in the last 72 hours. Thyroid Function Tests: No results for input(s): TSH, T4TOTAL, FREET4, T3FREE, THYROIDAB in the last 72 hours. Anemia Panel: No results for input(s): VITAMINB12, FOLATE, FERRITIN, TIBC, IRON,  RETICCTPCT in the last 72 hours. Sepsis Labs: No results for input(s): PROCALCITON, LATICACIDVEN in the last 168 hours.  Recent Results (from the past 240 hour(s))  MRSA PCR Screening     Status: None   Collection Time: 10/13/17  6:06 AM  Result Value Ref Range Status   MRSA by PCR NEGATIVE NEGATIVE Final    Comment:        The GeneXpert MRSA Assay (FDA approved for NASAL specimens only), is one component of a comprehensive MRSA colonization surveillance program. It is not intended to diagnose MRSA infection nor to guide or monitor treatment for MRSA infections. Performed at [facility], [address] [place], [place] [address]   Culture, Urine     Status: None   Collection Time: 10/15/17 10:00 AM  Result Value Ref Range Status   Specimen Description   Final    URINE, CLEAN CATCH Performed at [facility], [address] [place], [place] [address]    Special Requests   Final    NONE Performed at [facility], [address], [place], [place] [address]    Culture   Final    NO GROWTH Performed at [facility], [place] [address], [place], [place] [address]    Report Status 10/16/2017 FINAL   Final         Radiology Studies: No results found.      Scheduled Meds: . atorvastatin  40 mg Oral q1800  . bethanechol  25 mg Oral TID  . citalopram  10 mg Oral Daily  . clopidogrel  75 mg Oral Q breakfast  . enoxaparin (LOVENOX) injection  40 mg Subcutaneous Q24H  . insulin aspart  0-9 Units Subcutaneous TID WC  . [START ON 10/19/2017] insulin glargine  5 Units Subcutaneous Daily  . levETIRAcetam  500 mg Oral BID  . mouth rinse  15 mL Mouth Rinse BID  . senna-docusate  2 tablet Oral QHS  . tamsulosin  0.4 mg Oral QPC supper   Continuous Infusions: . ciprofloxacin    . metronidazole Stopped (10/18/17 1416)     LOS: 5 days    Time spent: 30 minutes    Kathie Dike, MD [facility] Pager [phone]  If 7PM-7AM, please contact night-coverage [email or web address] Password [facility] 10/18/2017, 4:46 PM

## 2017-10-18 NOTE — Progress Notes (Signed)
  Speech Language Pathology Treatment: Dysphagia  Patient Details Name: Sandra [name] MRN: [id number] DOB: May 10, 1941 Today's Date: 10/18/2017 Time: 7096-2836 SLP Time Calculation (min) (ACUTE ONLY): 26 min  Assessment / Plan / Recommendation Clinical Impression  Ongoing diagnostic dysphagia intervention completed with Pt at bedside. Pt had consumed only a few bites of her lunch meal, but did eat the entire [employer]. Pt with right inattention and became more interactive when SLP approached from her left side. Pt with increased vocalizations today, however only "no" was intelligible. She followed 1-step directions with max multimodality cues (open mouth, say ahh, move tongue, use hand). She accepted graham cracker (even attempted to self feed with left hand), but promptly expectorated it and shook her head. SLP presented small pieces of cookie in pudding and she ate the pudding and expectorated the cookie pieces. Pt continued to do the same when presented with chopped meal. She readily accepted pudding, drink, and ice cream. Pt with occasional right sided labial spillage. Pt continues to present with limited po intake of unknown etiology. Will downgrade to D1/puree and thin liquids to see if Pt will consume meats and vegetables. SLP will continue to follow during acute stay.   HPI HPI: This patient is a 77 year old female with history of prior CVA with global aphasia, diabetes, hypertension, peripheral artery disease.  She was sent from her extended care facility today for evaluation of vomiting.  I am told she has vomited 6 or 7 times this afternoon.  The care providers felt as though she was having abdominal pain and were concerned about a bowel obstruction.  Patient adds very little history secondary to prior CVA/aphasia.      SLP Plan  Continue with current plan of care       Recommendations  Diet recommendations: Dysphagia 1(puree)Thin liquid(puree meats) Liquids provided via:  Straw Medication Administration: Whole meds with liquid Supervision: Staff to assist with self feeding;Full supervision/cueing for compensatory strategies Compensations: Minimize environmental distractions;Slow rate;Small sips/bites;Lingual sweep for clearance of pocketing Postural Changes and/or Swallow Maneuvers: Seated upright 90 degrees;Upright 30-60 min after meal                Oral Care Recommendations: Oral care before and after PO;Staff/trained caregiver to provide oral care Follow up Recommendations: Skilled Nursing facility SLP Visit Diagnosis: Dysphagia, unspecified (R13.10) Plan: Continue with current plan of care       Thank you,  Genene Churn, [place]                 [place] 10/18/2017, 2:01 PM

## 2017-10-18 NOTE — Progress Notes (Signed)
Inpatient Diabetes Program Recommendations  AACE/ADA: New Consensus Statement on Inpatient Glycemic Control (2015)  Target Ranges:  Prepandial:   less than 140 mg/dL      Peak postprandial:   less than 180 mg/dL (1-2 hours)      Critically ill patients:  140 - 180 mg/dL   Results for Sandra [name], Sandra [name] (MRN [id number]) as of 10/18/2017 12:53  Ref. Range 10/17/2017 07:25 10/17/2017 11:57 10/17/2017 16:17 10/17/2017 20:56 10/18/2017 07:29 10/18/2017 11:32 10/18/2017 12:22  Glucose-Capillary Latest Ref Range: 65 - 99 mg/dL 181 (H) 125 (H) 89 142 (H) 96 46 (L) 115 (H)   Review of Glycemic Control  Diabetes history: DM2 Outpatient Diabetes medications: Lantus 10 units daily, Metformin 500 mg TID Current orders for Inpatient glycemic control: Lantus 10 units daily, Novolog 0-9 units TID with meals  Inpatient Diabetes Program Recommendations: Insulin - Basal: Please consider decreasing Lantus to 5 units daily.  Thanks, Barnie Alderman, RN, MSN, CDE Diabetes Coordinator Inpatient Diabetes Program [phone] (Team Pager from 8am to 5pm)

## 2017-10-18 NOTE — Progress Notes (Signed)
Subjective: Interval History: Patient is alert but does not comunicate  Objective: Vital signs in last 24 hours: Temp:  [98 F (36.7 C)-99.3 F (37.4 C)] 98.6 F (37 C) (06/04 0400) Pulse Rate:  [82-110] 95 (06/04 0700) Resp:  [9-25] 18 (06/04 0700) BP: (91-125)/(47-76) 125/68 (06/04 0700) SpO2:  [98 %-100 %] 100 % (06/04 0700) Weight:  [81.7 kg (180 lb 1.9 oz)] 81.7 kg (180 lb 1.9 oz) (06/04 0400) Weight change: 1.4 kg (3 lb 1.4 oz)  Intake/Output from previous day: 06/03 0701 - 06/04 0700 In: 1932.5 [P.O.:120; I.V.:1412.5; IV Piggyback:400] Out: 1600 [Urine:1600] Intake/Output this shift: No intake/output data recorded.  Patient is more alert today Chest is clear to auscultation Heart exam revealed regular rate and rhythm no murmur Extremities no edema  Lab Results: Recent Labs    10/17/17 0534  WBC 11.6*  HGB 10.0*  HCT 30.5*  PLT 346   BMET:  Recent Labs    10/17/17 0534 10/18/17 0425  NA 141 144  K 4.0 4.0  CL 112* 116*  CO2 25 24  GLUCOSE 182* 91  BUN 57* 48*  CREATININE 1.63* 1.10*  CALCIUM 7.4* 7.2*   No results for input(s): PTH in the last 72 hours. Iron Studies: No results for input(s): IRON, TIBC, TRANSFERRIN, FERRITIN in the last 72 hours.  Studies/Results: No results found.  I have reviewed the patient's current medications.  Assessment/Plan: Acute kidney injury: Possibly multifactorial including prerenal syndrome/ATN/ACE/contrast induced acute kidney injury. Patient is none oliguric and her renal function continue to improve 2] hypokalemia: Her potassium remains normal. 3] hypertension: His systolic blood pressure is low normal.  4] diabetes: Her blood sugar is reasonably controlled 5] history of CVA with aphasia 6] hypernatremia: Her sodium has corrected. 7] history of colitis Plan: 1] we will continue with present treatment but decrease ivf to 50 cc/min 2]D/C lasix 3] we will check her renal panel in the morning.    LOS: 5 days    Susana Duell S 10/18/2017,8:13 AM

## 2017-10-18 NOTE — Care Management Note (Signed)
Case Management Note  Patient Details  Name: Sandra [name] MRN: [id number] Date of Birth: 09/03/1940  If discussed at [place] Length of Stay Meetings, dates discussed:   10/18/2017 Additional Comments:  Denys Labree, Chauncey [place], RN 10/18/2017, 12:09 PM

## 2017-10-18 NOTE — Progress Notes (Signed)
PHARMACY NOTE:  ANTIMICROBIAL RENAL DOSAGE ADJUSTMENT  Current antimicrobial regimen includes a mismatch between antimicrobial dosage and estimated renal function.  As per policy approved by the Pharmacy & Therapeutics and Medical Executive Committees, the antimicrobial dosage will be adjusted accordingly.  Current antimicrobial dosage:  Cipro 400mg  IV every 24 hours.  Indication: Intra-abdominal Infection  Renal Function:  Estimated Creatinine Clearance: 42.7 mL/min (A) (by C-G formula based on SCr of 1.1 mg/dL (H)). []      On intermittent HD, scheduled: []      On CRRT    Antimicrobial dosage has been changed to:  Cipro 400mg  IV every 12 hours.  Thank you for allowing pharmacy to be a part of this patient's care.  Pricilla Larsson, [facility] 10/18/2017 12:52 PM

## 2017-10-19 ENCOUNTER — Encounter (HOSPITAL_COMMUNITY): Payer: Self-pay | Admitting: Primary Care

## 2017-10-19 DIAGNOSIS — L8990 Pressure ulcer of unspecified site, unspecified stage: Secondary | ICD-10-CM

## 2017-10-19 DIAGNOSIS — Z7189 Other specified counseling: Secondary | ICD-10-CM

## 2017-10-19 DIAGNOSIS — Z515 Encounter for palliative care: Secondary | ICD-10-CM

## 2017-10-19 LAB — RENAL FUNCTION PANEL
Albumin: 1.6 g/dL — ABNORMAL LOW (ref 3.5–5.0)
BUN: 43 mg/dL — AB (ref 6–20)
CALCIUM: 7.3 mg/dL — AB (ref 8.9–10.3)
CO2: 26 mmol/L (ref 22–32)
CREATININE: 0.88 mg/dL (ref 0.44–1.00)
Chloride: 117 mmol/L — ABNORMAL HIGH (ref 101–111)
GFR calc Af Amer: >60 mL/min (ref >60)
Glucose, Bld: 163 mg/dL — ABNORMAL HIGH (ref 65–99)
PHOSPHORUS: 2 mg/dL — AB (ref 2.5–4.6)
Potassium: 3.6 mmol/L (ref 3.5–5.1)
SODIUM: 145 mmol/L (ref 135–145)

## 2017-10-19 LAB — GLUCOSE, CAPILLARY
GLUCOSE-CAPILLARY: 182 mg/dL — AB (ref 65–99)
GLUCOSE-CAPILLARY: 192 mg/dL — AB (ref 65–99)
Glucose-Capillary: 149 mg/dL — ABNORMAL HIGH (ref 65–99)
Glucose-Capillary: 258 mg/dL — ABNORMAL HIGH (ref 65–99)
Glucose-Capillary: 236 mg/dL — ABNORMAL HIGH (ref 65–99)

## 2017-10-19 MED ORDER — INSULIN GLARGINE 100 UNIT/ML ~~LOC~~ SOLN
8.0000 [IU] | Freq: Every day | SUBCUTANEOUS | Status: DC
  Administered 2017-10-19 – 2017-10-22 (×4): 8 [IU] via SUBCUTANEOUS
  Filled 2017-10-19 (×7): qty 0.08

## 2017-10-19 MED ORDER — BOOST / RESOURCE BREEZE PO LIQD CUSTOM
1.0000 | Freq: Three times a day (TID) | ORAL | Status: DC
  Administered 2017-10-19 – 2017-10-20 (×4): 1 via ORAL

## 2017-10-19 MED ORDER — CITALOPRAM HYDROBROMIDE 20 MG PO TABS
20.0000 mg | ORAL_TABLET | Freq: Every day | ORAL | Status: DC
  Administered 2017-10-20 – 2017-10-29 (×9): 20 mg via ORAL
  Filled 2017-10-19 (×9): qty 1

## 2017-10-19 NOTE — Progress Notes (Signed)
Ronnette Rump  MRN: [id number]  DOB/AGE: 1940/11/01 77 y.o.  Primary Care Physician:Hagler, Apolonio Schneiders, MD  Admit date: 10/12/2017  Chief Complaint:  Chief Complaint  Patient presents with  . Abdominal Pain    S-Pt presented on  10/12/2017 with  Chief Complaint  Patient presents with  . Abdominal Pain  .    Pt is resting comfortably.    Pt awake, open eyes but does not offer any complaints.    Meds . atorvastatin  40 mg Oral q1800  . bethanechol  25 mg Oral TID  . citalopram  10 mg Oral Daily  . clopidogrel  75 mg Oral Q breakfast  . enoxaparin (LOVENOX) injection  40 mg Subcutaneous Q24H  . insulin aspart  0-9 Units Subcutaneous TID WC  . insulin glargine  8 Units Subcutaneous Daily  . levETIRAcetam  500 mg Oral BID  . mouth rinse  15 mL Mouth Rinse BID  . senna-docusate  2 tablet Oral QHS  . tamsulosin  0.4 mg Oral QPC supper       Physical Exam: Vital signs in last 24 hours: Temp:  [98.1 F (36.7 C)-100.6 F (38.1 C)] 98.1 F (36.7 C) (06/05 0800) Pulse Rate:  [30-117] 95 (06/05 0600) Resp:  [18-29] 20 (06/05 0600) BP: (109-157)/(49-96) 141/69 (06/05 0600) SpO2:  [90 %-100 %] 93 % (06/05 0600) Weight change:  Last BM Date: 10/16/17  Intake/Output from previous day: 06/04 0701 - 06/05 0700 In: 1692.5 [P.O.:300; I.V.:692.5; IV Piggyback:700] Out: 1550 [Urine:1550] Total I/O In: 240 [P.O.:240] Out: -    Physical Exam: General- pt is awake, non communicative. Resp- No acute REsp distress, CTA B/L NO Rhonchi CVS- S1S2 regular ij rate and rhythm GIT- BS+, soft, NT, ND EXT- NO LE Edema, NO Cyanosis   Lab Results: CBC Recent Labs    10/17/17 0534  WBC 11.6*  HGB 10.0*  HCT 30.5*  PLT 346    BMET Recent Labs    10/18/17 0425 10/19/17 0355  NA 144 145  K 4.0 3.6  CL 116* 117*  CO2 24 26  GLUCOSE 91 163*  BUN 48* 43*  CREATININE 1.10* 0.88  CALCIUM 7.2* 7.3*    Creat trend 2019 1.7=>2.7=>1.1=>0.88  Sodium 2019  146=>145  MICRO Recent Results (from the past 240 hour(s))  MRSA PCR Screening     Status: None   Collection Time: 10/13/17  6:06 AM  Result Value Ref Range Status   MRSA by PCR NEGATIVE NEGATIVE Final    Comment:        The GeneXpert MRSA Assay (FDA approved for NASAL specimens only), is one component of a comprehensive MRSA colonization surveillance program. It is not intended to diagnose MRSA infection nor to guide or monitor treatment for MRSA infections. Performed at [facility], [address], [place], [place] [address]   Culture, Urine     Status: None   Collection Time: 10/15/17 10:00 AM  Result Value Ref Range Status   Specimen Description   Final    URINE, CLEAN CATCH Performed at [facility], [address] [place], [place] [address]    Special Requests   Final    NONE Performed at [facility], [address], [place], [place] [address]    Culture   Final    NO GROWTH Performed at [facility], [place] [address], [place], [place] [address]    Report Status 10/16/2017 FINAL  Final      Lab Results  Component Value Date  CALCIUM 7.3 (L) 10/19/2017   CAION 1.16 06/15/2017   PHOS 2.0 (L) 10/19/2017   Alb  1.6 Corrected calcium 7.3+ 2.0=8.3            Impression: 1)Renal  AKI secondary to Prerenal/ATN/IV dye               AKI now better               Creat trending down  2)CVS Hemodynamically stable   3)Anemia HGb stable  4)Hypophosphatemia sec to poor po inatke ,GI issues and IVF  Will follow  5)Hypernatremia Now better   6)CNS- hx of CVA with aphasia stable  7)Acid base Co2 at goal   8) Hypocalcemia-Calcium better after correction for low albumin    Plan:   Will continue curren tx Will recheck phos   Yazlyn Wentzel S 10/19/2017, 9:29 AM

## 2017-10-19 NOTE — Progress Notes (Addendum)
PROGRESS NOTE    Sandra [name]  WVP:[id number] DOB: 1941/02/27 DOA: 10/12/2017 PCP: Caren Macadam, MD    Brief Narrative:  77 year old female admitted to the hospital with abdominal pain. She was found to have colitis with possible fecal impaction on admission. 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 Neurology following. She also has worsening renal function which is now improving and being followed by Nephrology.  Overall renal function has improved.  Mental status is also improving, although p.o. intake remains poor.   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 Colitis.  Currently on ciprofloxacin and Flagyl (day 7). Recheck CBC in am and if leukocytosis improved with no further diarrhea; plan to DC.  No nausea or vomiting.  Continue current treatments through today. 2. Acute renal failure-improving.  She did receive some IV contrast on admission.  Nephrology following. She has been treated with IV fluids and IV lasix.  No hydronephrosis on CT scan done on admission.  Renal ultrasound has been ordered.  Creatinine has improved. Lasix has been discontinued. Will discontinue further IV fluids since she is developing anasarca 3. Partial seizures.  Neurology following.  Started on Keppra with resolution of seizures 4. Acute encephalopathy, likely metabolic.  Overall appears to be slowly improving.  ABG and ammonia were unremarkable.  Possibly related to dehydration/urinary tract infection.  At baseline she has aphasia which makes communication  difficult. Based on notes, she appears to be more interactive with other staff members 5. Diabetes.  On sliding scale insulin.  Also on Lantus.  Blood sugar has been running low. Will increase Lantus to 8U today. 6. Hypotension. Still having episodes of soft blood pressures.  Cortisol normal 7. Previous stroke.  She has residual right-sided hemiparesis and global aphasia.  Continue on Plavix.  MRI performed during this hospitalization was poor quality. 8. Possible fecal impaction.  Resolved with enemas and digital disimpaction.  9. UTI. Urinalysis indicates pyuria.  Urine culture showed no growth.  Culture was obtained while patient was already on ciprofloxacin. 10. Hypokalemia-repleted. 11. Irregular rhythm.  Noted to be sinus rhythm with PACs. 12. Severe protein calorie malnutrition.  P.o. intake remains poor.  Nutrition following. Calorie count has been initiated. Palliative care consulted today for further evaluation regarding aggressive care.  13. Depression. Increased Celexa to 20mg  today and will see if this eventually helps her.   DVT prophylaxis: Lovenox Code Status: Full code Family Communication: None currently at bedside Disposition Plan: Palliative care consultation due to poor intake   Consultants:   Neurology  Nephrology  Procedures:  EEG:1. Frontal intermittent rhythmic delta activities are observed. This is typically seen in toxic metabolic encephalopathies.  2. Triphasic waves are also observed typically seen in metabolic encephalopathies particularly due to hepatic failure or renal failure.  Antimicrobials:   Ciprofloxacin 5/30 >  Flagyl 5/30>   Subjective: Sitting up in bed. Does not answer questions. No acute events noted overnight.  Objective: Vitals:   10/19/17 0458 10/19/17 0500 10/19/17 0600 10/19/17 0800  BP:  137/68 (!) 141/69   Pulse:  93 95   Resp:  20 20   Temp: 98.6 F (37 C)   98.1 F (36.7 C)  TempSrc: Axillary   Oral  SpO2:  91% 93%     Weight:      Height:        Intake/Output Summary (Last 24 hours) at 10/19/2017 1227 Last data filed at 10/19/2017 0900 Gross per 24 hour  Intake 1270 ml  Output 1550 ml  Net -280 ml   Filed Weights   10/16/17 0758 10/17/17 0500 10/18/17 0400  Weight: 80.3 kg (177 lb 0.5 oz) 80.6 kg (177 lb 11.1 oz) 81.7 kg (180 lb 1.9 oz)    Examination:  General exam: Alert, awake, nonverbal Respiratory system: Clear to auscultation. Respiratory effort normal. Cardiovascular system:RRR. No murmurs, rubs, gallops. Gastrointestinal system: Abdomen is nondistended, soft and nontender. No organomegaly or masses felt. Normal bowel sounds heard. Central nervous system: unable to assess due to mental status. Extremities: developing anasarca Skin: No rashes, lesions or ulcers Psychiatry: nonverbal    Data Reviewed: I have personally reviewed following labs and imaging studies  CBC: Recent Labs  Lab 10/13/17 0104 10/13/17 0654 10/14/17 0342 10/15/17 0557 10/17/17 0534  WBC 17.5* 14.6* 13.3* 13.0* 11.6*  NEUTROABS 15.5*  --   --   --   --   HGB 12.2 11.2* 10.4* 10.2* 10.0*  HCT 37.8 34.4* 33.1* 32.1* 30.5*  MCV 82.9 83.1 85.1 83.4 81.1  PLT 398 359 312 329 476   Basic Metabolic Panel: Recent Labs  Lab 10/14/17 0342 10/15/17 0557 10/16/17 0522 10/17/17 0534 10/18/17 0425 10/19/17 0355  NA 145 139 142 141 144 145  K 4.6 3.9 2.8* 4.0 4.0 3.6  CL 110 104 107 112* 116* 117*  CO2 23 25 26 25 24 26   GLUCOSE 233* 99 181* 182* 91 163*  BUN 42* 54* 61* 57* 48* 43*  CREATININE 2.37* 2.71* 2.16* 1.63* 1.10* 0.88  CALCIUM 7.3* 7.0* 7.4* 7.4* 7.2* 7.3*  MG 2.4  --  2.9*  --   --   --   PHOS  --   --  3.6 2.2* 2.0* 2.0*   GFR: Estimated Creatinine Clearance: 53.3 mL/min (by C-G formula based on SCr of 0.88 mg/dL). Liver Function Tests: Recent Labs  Lab 10/13/17 0104 10/13/17 0654 10/14/17 0342 10/16/17 0522 10/17/17 0534 10/18/17 0425 10/19/17 0355  AST 41 35 43*  --   --   --   --    ALT 18 16 14   --   --   --   --   ALKPHOS 103 89 84  --   --   --   --   BILITOT 0.9 0.7 1.0  --   --   --   --   PROT 6.5 5.8* 5.4*  --   --   --   --   ALBUMIN 2.4* 2.0* 1.8* 1.8* 1.6* 1.7* 1.6*   Recent Labs  Lab 10/13/17 0104  LIPASE 37   Recent Labs  Lab 10/15/17 1130  AMMONIA 30   Coagulation Profile: No results for input(s): INR, PROTIME in the last 168 hours. Cardiac Enzymes: Recent Labs  Lab 10/13/17 0104 10/13/17 0654 10/13/17 1339 10/13/17 1856  TROPONINI 0.03* 0.04* 0.04* 0.05*   BNP (last 3 results) No results for input(s): PROBNP in the last 8760 hours. HbA1C: No results for input(s): HGBA1C in the last 72 hours. CBG: Recent Labs  Lab 10/18/17 1222  10/18/17 1643 10/18/17 2058 10/19/17 0737 10/19/17 1110  GLUCAP 115* 149* 175* 182* 192*   Lipid Profile: No results for input(s): CHOL, HDL, LDLCALC, TRIG, CHOLHDL, LDLDIRECT in the last 72 hours. Thyroid Function Tests: No results for input(s): TSH, T4TOTAL, FREET4, T3FREE, THYROIDAB in the last 72 hours. Anemia Panel: No results for input(s): VITAMINB12, FOLATE, FERRITIN, TIBC, IRON, RETICCTPCT in the last 72 hours. Sepsis Labs: No results for input(s): PROCALCITON, LATICACIDVEN in the last 168 hours.  Recent Results (from the past 240 hour(s))  MRSA PCR Screening     Status: None   Collection Time: 10/13/17  6:06 AM  Result Value Ref Range Status   MRSA by PCR NEGATIVE NEGATIVE Final    Comment:        The GeneXpert MRSA Assay (FDA approved for NASAL specimens only), is one component of a comprehensive MRSA colonization surveillance program. It is not intended to diagnose MRSA infection nor to guide or monitor treatment for MRSA infections. Performed at [facility], [address] [place], [place] [address]   Culture, Urine     Status: None   Collection Time: 10/15/17 10:00 AM  Result Value Ref Range Status   Specimen Description   Final    URINE, CLEAN CATCH Performed at [facility], [address], [place], [place] [address]    Special Requests   Final    NONE Performed at [facility], [address] [place], [place] [address]    Culture   Final    NO GROWTH Performed at [facility], [place] [address], [place], [place] [address]    Report Status 10/16/2017 FINAL  Final         Radiology Studies: No results found.      Scheduled Meds: . atorvastatin  40 mg Oral q1800  . bethanechol  25 mg Oral TID  . citalopram  10 mg Oral Daily  . clopidogrel  75 mg Oral Q breakfast  . enoxaparin (LOVENOX) injection  40 mg Subcutaneous Q24H  . feeding supplement  1 Container Oral TID BM  . insulin aspart  0-9 Units Subcutaneous TID WC  . insulin glargine  8 Units Subcutaneous Daily  . levETIRAcetam  500 mg Oral BID  . mouth rinse  15 mL Mouth Rinse BID  . senna-docusate  2 tablet Oral QHS  . tamsulosin  0.4 mg Oral QPC supper   Continuous Infusions: . ciprofloxacin Stopped (10/19/17 0735)  . metronidazole Stopped (10/19/17 7517)     LOS: 6 days    Time spent: 30 minutes    Rion Schnitzer Darleen Crocker, DO [facility] Pager [phone]  If 7PM-7AM, please contact night-coverage [email or web address] Password [facility] 10/19/2017, 12:27 PM

## 2017-10-19 NOTE — Consult Note (Addendum)
Consultation Note Date: 10/19/2017   Patient Name: Sandra [name] Service  DOB: 1940-11-19  MRN: [id number]  Age / Sex: 77 y.o., female  PCP: Caren Macadam, MD Referring Physician: Rodena Goldmann, DO  Reason for Consultation: Establishing goals of care  HPI/Patient Profile: 77 y.o. female  with past medical history of obesity, high blood pressure and cholesterol with noncompliance to medication regimen per son, history of rectal bleeding for 7 years, history of GI bleeds and hemorrhoids, diabetes, relatively recent CVA with global aphasia admitted on 10/12/2017 with colitis, stool burden.   Clinical Assessment and Goals of Care: Mrs. Sciara is lying quietly in bed. She looks in my direction making them briefly keeping eye contact. I ask if she can interact with me by nodding yes or no, she does not attempt to do either. Ask if she understands what I'm saying nodding my head, but she looks away.There is no family at bedside at this time. Conference with nursing staff related to by mouth intake, plan of care. Conference with hospitalist related to plan of care. Meeting with son, Darin Redmann today. We talk about his mother's health history, her acute and chronic illnesses. [name] and I talk about Mrs. Rashid family. We also talk about [name] history with depression, and her history of making choices that sometimes are not good for her health. [name] and I talk about her struggles answer recent stroke. She is now a resident of residential SNF and will likely stay there. [name] states that his wife would prefer to take his mother to their home, but he realizes that they cannot provide 24/7 care. We talk about the chronic illness pathway, what is normal and expected. I share that it is expected for Mrs. Badman to have another event whether it's UTI, pneumonia, skin breakdown. It is not normal for the human body to be  bedbound. Dr. Manuella Ghazi arrives during our conversation. We talk about [name] nutritional status, her desire versus ability to eat. We discussed medication changes related to depression and decreased desire to eat. We talk about code status, see below.  We plan for a follow-up meeting tomorrow.  Healthcare power of attorney NEXT OF KIN -Roni Friberg, listed as relative in chart, is daughter-in-law, wife of son Vena Bassinger.  Mrs. Dickens has a son in [place] who recently lost his wife, and 2 sons in [place].  She also has at least 2 daughters.  [name] is her spokesperson locally.  SUMMARY OF RECOMMENDATIONS   At this point, continue to treat the treatable. Continue CODE STATUS discussions. Continue discussions related to goals of care.  Code Status/Advance Care Planning:  Full code -at this time remains full code.  Son [name] states that he needs to discuss CODE STATUS with his siblings. We talk about the concept of "allow a natural death". We also discuss "treating the treatable".  Symptom Management:   Per hospitalist, no additional needs at this time.  Palliative Prophylaxis:   Bowel Regimen and Turn Reposition  Additional Recommendations (Limitations, Scope, Preferences):  Full Scope Treatment  Psycho-social/Spiritual:   Desire for further Chaplaincy support:yes  Additional Recommendations: Caregiving  Support/Resources and Education on Hospice  Prognosis:   < 6 months, or less would not be surprising based on poor functional status, frailty, poor PO intake. Take   Discharge Planning: anticipate return to residential SNF, [facility]      Primary Diagnoses: Present on Admission: . Colitis . Acute renal failure ([place]) . Obstipation . Type 2 diabetes mellitus with neurological complications ([place]) . Hyperlipidemia associated with type 2 diabetes mellitus ([place]) . Global aphasia . Essential hypertension . Hemiparesthesia . Resting tremor . (Resolved) Question of Extension  of stroke   I have reviewed the medical record, interviewed the patient and family, and examined the patient. The following aspects are pertinent.  Past Medical History:  Diagnosis Date  . Colon polyp   . Constipation   . Diabetes mellitus without complication ([place])   . GI bleed   . Hemorrhoids   . High cholesterol   . Hypertension   . PAD (peripheral artery disease) ([place])   . Peripheral neuropathy   . Rectal bleeding    "for 7 years"  . Rectal bleeding   . Stroke [facility])    Social History   Socioeconomic History  . Marital status: Widowed    Spouse name: Not on file  . Number of children: Not on file  . Years of education: Not on file  . Highest education level: Not on file  Occupational History  . Not on file  Social Needs  . Financial resource strain: Not on file  . Food insecurity:    Worry: Not on file    Inability: Not on file  . Transportation needs:    Medical: Not on file    Non-medical: Not on file  Tobacco Use  . Smoking status: Never Smoker  . Smokeless tobacco: Never Used  Substance and Sexual Activity  . Alcohol use: No  . Drug use: No  . Sexual activity: Never  Lifestyle  . Physical activity:    Days per week: Not on file    Minutes per session: Not on file  . Stress: Not on file  Relationships  . Social connections:    Talks on phone: Not on file    Gets together: Not on file    Attends religious service: Not on file    Active member of club or organization: Not on file    Attends meetings of clubs or organizations: Not on file    Relationship status: Not on file  Other Topics Concern  . Not on file  Social History Narrative  . Not on file   Family History  Problem Relation Age of Onset  . Diabetes Mother   . Cancer Father   . Diabetes Sister   . Diabetes Brother   . Diabetes Daughter   . Diabetes Maternal Grandmother   . Diabetes Daughter   . Colon cancer Neg Hx    Scheduled Meds: . atorvastatin  40 mg Oral q1800  .  bethanechol  25 mg Oral TID  . [START ON 10/20/2017] citalopram  20 mg Oral Daily  . clopidogrel  75 mg Oral Q breakfast  . enoxaparin (LOVENOX) injection  40 mg Subcutaneous Q24H  . feeding supplement  1 Container Oral TID BM  . insulin aspart  0-9 Units Subcutaneous TID WC  . insulin glargine  8 Units Subcutaneous Daily  . levETIRAcetam  500 mg Oral BID  . mouth rinse  15 mL Mouth Rinse BID  . senna-docusate  2 tablet Oral QHS  . tamsulosin  0.4 mg Oral QPC supper   Continuous Infusions: . ciprofloxacin Stopped (10/19/17 0735)  . metronidazole 500 mg (10/19/17 1341)   PRN Meds:.acetaminophen, ondansetron **OR** ondansetron (ZOFRAN) IV Medications Prior to Admission:  Prior to Admission medications   Medication Sig Start Date End Date Taking? Authorizing Provider  amLODipine (NORVASC) 10 MG tablet Take 1 tablet (10 mg total) by mouth daily. 07/14/17  Yes Love, Ivan Anchors, PA-C  aspirin 325 MG EC tablet Take 1 tablet (325 mg total) by mouth daily. 07/14/17  Yes Love, Ivan Anchors, PA-C  atorvastatin (LIPITOR) 40 MG tablet Take 1 tablet (40 mg total) by mouth daily at 6 PM. 06/20/17  Yes Johnson, Clanford L, MD  bethanechol (URECHOLINE) 25 MG tablet Take 1 tablet (25 mg total) by mouth 3 (three) times daily. 07/14/17  Yes Love, Ivan Anchors, PA-C  bisacodyl (DULCOLAX) 10 MG suppository Place 10 mg rectally once as needed for moderate constipation.   Yes [provider]  citalopram (CELEXA) 10 MG tablet Take 10 mg by mouth daily.   Yes [provider]  clopidogrel (PLAVIX) 75 MG tablet Take 1 tablet (75 mg total) by mouth daily with breakfast. 06/21/17  Yes Johnson, Clanford L, MD  collagenase (SANTYL) ointment Apply 1 application topically daily.   Yes [provider]  gabapentin (NEURONTIN) 100 MG capsule Take 100 mg by mouth 3 (three) times daily.   Yes [provider]  insulin glargine (LANTUS) 100 UNIT/ML injection Inject 0.35 mLs (35 Units total) into the skin  daily. Patient taking differently: Inject 10 Units into the skin daily.  07/14/17  Yes Love, Ivan Anchors, PA-C  metFORMIN (GLUCOPHAGE) 500 MG tablet Take by mouth 3 (three) times daily.   Yes [provider]  Multiple Vitamins-Minerals (ONE-A-DAY 50 PLUS PO) Take 1 tablet by mouth daily.   Yes [provider]  senna-docusate (SENOKOT-S) 8.6-50 MG tablet Take 2 tablets by mouth at bedtime. Patient taking differently: Take 2 tablets by mouth daily.  07/14/17  Yes Love, Ivan Anchors, PA-C  tamsulosin (FLOMAX) 0.4 MG CAPS capsule Take 1 capsule (0.4 mg total) by mouth daily after supper. 07/14/17  Yes Love, Ivan Anchors, PA-C  traMADol (ULTRAM) 50 MG tablet Take 50 mg by mouth every 6 (six) hours as needed.   Yes [provider]  vitamin B-12 (CYANOCOBALAMIN) 1000 MCG tablet Take 1,000 mcg by mouth daily.   Yes [provider]  acetaminophen (TYLENOL) 325 MG tablet Take 1-2 tablets (325-650 mg total) by mouth every 4 (four) hours as needed for mild pain. 07/14/17   Bary Leriche, PA-C   Allergies  Allergen Reactions  . Hctz [Hydrochlorothiazide] Other (See Comments)    Caused pancreatitis flare up  . Morphine And Related Other (See Comments)    dizziness  . Valium [Diazepam] Other (See Comments)    Confusion, blurred vision   Review of Systems  Unable to perform ROS: Patient nonverbal    Physical Exam  Constitutional: She appears ill.  Makes him briefly keeps eye contact, nonverbal, does not try to make her basic needs known, appears acutely/chronically ill  HENT:  Head: Atraumatic.  Cardiovascular: Normal rate and regular rhythm.  Pulmonary/Chest: Effort normal. No respiratory distress.  Abdominal: Soft. Normal appearance. There is tenderness.  Neurological: She is alert.  Nonverbal, unable to determine orientation  Skin: Skin is warm and dry.  Psychiatric:  Does not try  to communicate her basic needs, will not nod yes or no  Nursing note and vitals  reviewed.   Vital Signs: BP 132/64   Pulse 90   Temp 98.1 F (36.7 C) (Oral)   Resp 20   Ht 5\' 3"  (1.6 m)   Wt 81.7 kg (180 lb 1.9 oz)   SpO2 95%   BMI 31.91 kg/m  Pain Scale: PAINAD   Pain Score: Asleep   SpO2: SpO2: 95 % O2 Device:SpO2: 95 % O2 Flow Rate: .O2 Flow Rate (L/min): 2 L/min  IO: Intake/output summary:   Intake/Output Summary (Last 24 hours) at 10/19/2017 1427 Last data filed at 10/19/2017 0900 Gross per 24 hour  Intake 950 ml  Output 1550 ml  Net -600 ml    LBM: Last BM Date: 10/16/17 Baseline Weight: Weight: 76.9 kg (169 lb 8.5 oz) Most recent weight: Weight: 81.7 kg (180 lb 1.9 oz)     Palliative Assessment/Data:   Flowsheet Rows     Most Recent Value  Intake Tab  Referral Department  Hospitalist  Unit at Time of Referral  Cardiac/Telemetry Unit  Palliative Care Primary Diagnosis  -- [GI]  Date Notified  10/19/17  Palliative Care Type  New Palliative care  Reason for referral  Clarify Goals of Care  Date of Admission  10/12/17  Date first seen by Palliative Care  10/19/17  # of days Palliative referral response time  0 Day(s)  # of days IP prior to Palliative referral  7  Clinical Assessment  Palliative Performance Scale Score  20%  Pain Max last 24 hours  Not able to report  Pain Min Last 24 hours  Not able to report  Dyspnea Max Last 24 Hours  Not able to report  Dyspnea Min Last 24 hours  Not able to report  Psychosocial & Spiritual Assessment  Palliative Care Outcomes  Patient/Family meeting held?  Yes  Who was at the meeting?  son [name] at bedside  [place] goals of care, Provided psychosocial or spiritual support, Provided advance care planning      Time In: 1350 Time Out: 1440 Time Total: 50 minutes Greater than 50%  of this time was spent counseling and coordinating care related to the above assessment and plan.  Signed by: Drue Novel, NP   Please contact Palliative Medicine Team phone at  [phone] for questions and concerns.  For individual provider: See Shea Evans

## 2017-10-20 ENCOUNTER — Encounter: Payer: Self-pay | Admitting: Family Medicine

## 2017-10-20 LAB — BASIC METABOLIC PANEL
Anion gap: 3 — ABNORMAL LOW (ref 5–15)
BUN: 34 mg/dL — ABNORMAL HIGH (ref 6–20)
CO2: 25 mmol/L (ref 22–32)
Calcium: 7.5 mg/dL — ABNORMAL LOW (ref 8.9–10.3)
Chloride: 119 mmol/L — ABNORMAL HIGH (ref 101–111)
Creatinine, Ser: 0.71 mg/dL (ref 0.44–1.00)
GFR calc Af Amer: >60 mL/min (ref >60)
GFR calc non Af Amer: >60 mL/min (ref >60)
Glucose, Bld: 216 mg/dL — ABNORMAL HIGH (ref 65–99)
Potassium: 3.5 mmol/L (ref 3.5–5.1)
Sodium: 147 mmol/L — ABNORMAL HIGH (ref 135–145)

## 2017-10-20 LAB — CBC
HCT: 32 % — ABNORMAL LOW (ref 36.0–46.0)
Hemoglobin: 10.2 g/dL — ABNORMAL LOW (ref 12.0–15.0)
MCH: 26.2 pg (ref 26.0–34.0)
MCHC: 31.9 g/dL (ref 30.0–36.0)
MCV: 82.1 fL (ref 78.0–100.0)
Platelets: 337 10*3/uL (ref 150–400)
RBC: 3.9 MIL/uL (ref 3.87–5.11)
RDW: 17.9 % — ABNORMAL HIGH (ref 11.5–15.5)
WBC: 14.1 10*3/uL — AB (ref 4.0–10.5)

## 2017-10-20 LAB — GLUCOSE, CAPILLARY
GLUCOSE-CAPILLARY: 235 mg/dL — AB (ref 65–99)
Glucose-Capillary: 213 mg/dL — ABNORMAL HIGH (ref 65–99)
Glucose-Capillary: 181 mg/dL — ABNORMAL HIGH (ref 65–99)
Glucose-Capillary: 161 mg/dL — ABNORMAL HIGH (ref 65–99)

## 2017-10-20 LAB — ALBUMIN: Albumin: 1.7 g/dL — ABNORMAL LOW (ref 3.5–5.0)

## 2017-10-20 LAB — PHOSPHORUS: Phosphorus: 2 mg/dL — ABNORMAL LOW (ref 2.5–4.6)

## 2017-10-20 MED ORDER — SODIUM CHLORIDE 0.45 % IV SOLN
INTRAVENOUS | Status: DC
  Administered 2017-10-20 – 2017-10-28 (×5): via INTRAVENOUS

## 2017-10-20 MED ORDER — PRO-STAT SUGAR FREE PO LIQD
30.0000 mL | Freq: Three times a day (TID) | ORAL | Status: DC
  Administered 2017-10-20 – 2017-10-25 (×13): 30 mL via ORAL
  Filled 2017-10-20 (×12): qty 30

## 2017-10-20 NOTE — Progress Notes (Addendum)
Calorie Count Note  24 hour calorie count ordered. Diet: Dysphagia 1. Thin Liquids Pt requires feeding Assistance?: Yes  Est needs: (Per RD note 6/4) Kcal:  1540-1920 kcal (20-25 kcal/kg BW) Protein:  77-92 g (1-1.2 g/kg BW)  6/4 Dinner 100% Magic Cup: 290 kcals, 9 g Pro 25% sweet tea: ~25 kcals 10% choc pudding: ~13 kcals Total: 328 kcals, 9 g Pro  6/5 intake: Breakfast: 50% 2% milk: 60 kcals, 4 g Pro 100% Apple Juice: 60 kcals, 0 g Pro 25% Puree Eggs:  20 kcals 1.5 g Pro 10% Puree Saus: 10 kcals, 1 gPro Total: 150 kcals, 6.5 g Pro  Lunch 100% Magic cup: 290 kcals, 9 g Pro 15% applesauce: 10 kcals 100% Whipped swt potato: 216 kcals, 3 g Pro 5% puree Broc: ~5 kcals Total: 521 kcals 17 g Pro  Dinner:  90% Puree Peaches: 80 kcals, 1 g Pro 15% Puree [place]: 15 kcals, 2.5 g Pro 50% Magic Cup: 145 kcals, 4.5 g Pro 90% whipped potato: 90 kcals, 2 g Pro 50% Puree peas: 75 kcals, 2 g Pro Total: 405 kcals 12g Pro     6/5 Supplements:  20% Glucerna (choc)- 45 kcals, 2g Pro 1 magic cups at lunch/dinner (documented as part of meals) 25% boost breeze: 65 kcals 2 g Pro Total: 110 kcals, 4 g Pro  Total intake from 6/6: 1186 kcal (77% of minimum estimated needs)  40 g protein (52% of minimum estimated needs)  Nutrition Dx: Inadequate oral intake related to AMS, lethargy and other CVA related deficits AEB meeting <80% kcals needs  Goal: Meet >90% kcal needs  Intervention:  While patient did not meet goal of 90% needs, she is above the 60% cutoff in which nutrition support is recommended. Intake wise, is improving. She is noted to require a lot of cueing/feeding support for intake; nursing reports pt requiring  30 minutes of feeding assistance/meal. RD is worried if she will be able to receive level of needed support in SNF.   RD received list of items the patient has been doing best with. She is noted to do better with solids >liquids and better with sweets. She consistently  eats potatoes. RD asked dietary to add side of mashed potatoes and 2x magic cups to meals. She should also receive sweet tea as wont drink unsweet tea. Also does well with juice. She did not do as well protein wise, will try Prostat since she does like sweet items.   GREATLY appreciate nursings/dietary assistance  Burtis Junes RD, LDN, CNSC Clinical Nutrition Available Tues-Sat via Pager: [phone] 10/20/2017 2:11 PM

## 2017-10-20 NOTE — Progress Notes (Signed)
Pt tolerated Pro-stat feeding supplement pretty well. Put half in applesauce and then half plain in medicine cup, enjoyed it either way. Pt intake for supper was approx. 200 mL and 20% of meal.

## 2017-10-20 NOTE — Progress Notes (Signed)
PROGRESS NOTE    Sandra [name]  TKZ:[id number] DOB: 08-06-1940 DOA: 10/12/2017 PCP: Caren Macadam, MD    Brief Narrative:  77 year old female admitted to the hospital with abdominal pain. She was found to have colitis with possible fecal impaction on admission. 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 Neurology following. She also has worsening renal function which is now improving and being followed by Nephrology.  Overall renal function has improved.  Mental status is also improving, although p.o. intake remains poor.   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 Colitis.  DC Cipro and Flagyl today. Recheck CBC in am. 2. Acute renal failure-improved.  She did receive some IV contrast on admission.  Nephrology has recommended some IV fluid with half-normal saline.  We will continue to monitor with repeat BMP. 3. Partial seizures.  Neurology following.  Started on Keppra with resolution of seizures 4. Acute encephalopathy, likely metabolic.  Overall appears to be slowly improving.  ABG and ammonia were unremarkable.  Possibly related to dehydration/urinary tract infection.  At baseline she has aphasia which makes communication difficult. Based on notes, she appears to be more interactive with other staff members 5. Protein calorie malnutrition.  Will consider feeding enteral versus TPN given hypophosphatemia.  Will discuss these  options further with patient's son. 6. Diabetes.  On sliding scale insulin.  Also on Lantus.  Blood sugar has been controlled. 7. Hypotension. Still having episodes of soft blood pressures.  Cortisol normal 8. Previous stroke.  She has residual right-sided hemiparesis and global aphasia.  Continue on Plavix.  MRI performed during this hospitalization was poor quality. 9. Possible fecal impaction.  Resolved with enemas and digital disimpaction.  10. UTI. Urinalysis indicates pyuria.  Urine culture showed no growth.  Culture was obtained while patient was already on ciprofloxacin. 11. Hypokalemia-repleted. 12. Irregular rhythm.  Noted to be sinus rhythm with PACs. 13. Severe protein calorie malnutrition.  P.o. intake remains poor.  Nutrition following. Calorie count has been initiated. Palliative care consulted today for further evaluation regarding aggressive care.  14. Depression. Increased Celexa to 20mg  today and will see if this eventually helps her.   DVT prophylaxis: Lovenox Code Status: Full code Family Communication: None currently at bedside Disposition Plan: Palliative care consultation due to poor intake   Consultants:   Neurology  Nephrology  Procedures:  EEG:1. Frontal intermittent rhythmic delta activities are observed. This is typically seen in toxic metabolic encephalopathies.  2. Triphasic waves are also observed typically seen in metabolic encephalopathies particularly due to hepatic failure or renal failure.  Antimicrobials:   Ciprofloxacin 5/30 >6/6  Flagyl 5/30>6/6   Subjective: Sitting up in bed. Does not answer questions. No acute events noted overnight.  Per nursing staff, appears to be slightly more responsive than previous days with more head nodding.  Objective: Vitals:   10/20/17 0500 10/20/17 0600 10/20/17 0700 10/20/17 0800  BP:    (!) 145/79  Pulse: 95 90 94 98  Resp: (!) 22 (!) 21 20 (!) 21  Temp:    98.4 F (36.9 C)  TempSrc:    Axillary    SpO2: 96% 95% 96% 95%  Weight: 85.7 kg (188 lb 15 oz)     Height:        Intake/Output Summary (Last 24 hours) at 10/20/2017 1242 Last data filed at 10/20/2017 0500 Gross per 24 hour  Intake 320 ml  Output 850 ml  Net -530 ml   Filed Weights   10/17/17 0500 10/18/17 0400 10/20/17 0500  Weight: 80.6 kg (177 lb 11.1 oz) 81.7 kg (180 lb 1.9 oz) 85.7 kg (188 lb 15 oz)    Examination:  General exam: Alert, awake, nonverbal Respiratory system: Clear to auscultation. Respiratory effort normal. Cardiovascular system:RRR. No murmurs, rubs, gallops. Gastrointestinal system: Abdomen is nondistended, soft and nontender. No organomegaly or masses felt. Normal bowel sounds heard. Central nervous system: unable to assess due to mental status. Extremities: developing anasarca Skin: No rashes, lesions or ulcers Psychiatry: nonverbal    Data Reviewed: I have personally reviewed following labs and imaging studies  CBC: Recent Labs  Lab 10/14/17 0342 10/15/17 0557 10/17/17 0534 10/20/17 0539  WBC 13.3* 13.0* 11.6* 14.1*  HGB 10.4* 10.2* 10.0* 10.2*  HCT 33.1* 32.1* 30.5* 32.0*  MCV 85.1 83.4 81.1 82.1  PLT 312 329 346 161   Basic Metabolic Panel: Recent Labs  Lab 10/14/17 0342  10/16/17 0522 10/17/17 0534 10/18/17 0425 10/19/17 0355 10/20/17 0539  NA 145   < > 142 141 144 145 147*  K 4.6   < > 2.8* 4.0 4.0 3.6 3.5  CL 110   < > 107 112* 116* 117* 119*  CO2 23   < > 26 25 24 26 25   GLUCOSE 233*   < > 181* 182* 91 163* 216*  BUN 42*   < > 61* 57* 48* 43* 34*  CREATININE 2.37*   < > 2.16* 1.63* 1.10* 0.88 0.71  CALCIUM 7.3*   < > 7.4* 7.4* 7.2* 7.3* 7.5*  MG 2.4  --  2.9*  --   --   --   --   PHOS  --   --  3.6 2.2* 2.0* 2.0* 2.0*   < > = values in this interval not displayed.   GFR: Estimated Creatinine Clearance: 60.1 mL/min (by C-G formula based on SCr of 0.71 mg/dL). Liver Function Tests: Recent Labs  Lab 10/14/17 0342 10/16/17 0522 10/17/17 0534 10/18/17 0425  10/19/17 0355 10/20/17 0539  AST 43*  --   --   --   --   --   ALT 14  --   --   --   --   --   ALKPHOS 84  --   --   --   --   --   BILITOT 1.0  --   --   --   --   --   PROT 5.4*  --   --   --   --   --   ALBUMIN 1.8* 1.8* 1.6* 1.7* 1.6* 1.7*   No results for input(s): LIPASE, AMYLASE in the last 168 hours. Recent Labs  Lab 10/15/17 1130  AMMONIA 30   Coagulation Profile: No results for input(s): INR, PROTIME in the last 168 hours. Cardiac Enzymes: Recent Labs  Lab 10/13/17 1339 10/13/17 1856  TROPONINI 0.04* 0.05*   BNP (last 3 results) No  results for input(s): PROBNP in the last 8760 hours. HbA1C: No results for input(s): HGBA1C in the last 72 hours. CBG: Recent Labs  Lab 10/19/17 1244 10/19/17 1636 10/19/17 2140 10/20/17 0756 10/20/17 1139  GLUCAP 149* 258* 236* 213* 181*   Lipid Profile: No results for input(s): CHOL, HDL, LDLCALC, TRIG, CHOLHDL, LDLDIRECT in the last 72 hours. Thyroid Function Tests: No results for input(s): TSH, T4TOTAL, FREET4, T3FREE, THYROIDAB in the last 72 hours. Anemia Panel: No results for input(s): VITAMINB12, FOLATE, FERRITIN, TIBC, IRON, RETICCTPCT in the last 72 hours. Sepsis Labs: No results for input(s): PROCALCITON, LATICACIDVEN in the last 168 hours.  Recent Results (from the past 240 hour(s))  MRSA PCR Screening     Status: None   Collection Time: 10/13/17  6:06 AM  Result Value Ref Range Status   MRSA by PCR NEGATIVE NEGATIVE Final    Comment:        The GeneXpert MRSA Assay (FDA approved for NASAL specimens only), is one component of a comprehensive MRSA colonization surveillance program. It is not intended to diagnose MRSA infection nor to guide or monitor treatment for MRSA infections. Performed at [facility], [address], [place], [place] [address]   Culture, Urine     Status: None   Collection Time: 10/15/17 10:00 AM  Result Value Ref Range Status   Specimen Description   Final    URINE, CLEAN  CATCH Performed at [facility], [address] [place], [place] [address]    Special Requests   Final    NONE Performed at [facility], [address], [place], [place] [address]    Culture   Final    NO GROWTH Performed at [facility], [place] [address], [place], [place] [address]    Report Status 10/16/2017 FINAL  Final         Radiology Studies: No results found.      Scheduled Meds: . atorvastatin  40 mg Oral q1800  . bethanechol  25 mg Oral TID  . citalopram  20 mg Oral Daily  . clopidogrel  75 mg Oral Q breakfast  . enoxaparin (LOVENOX) injection  40 mg Subcutaneous Q24H  . feeding supplement  1 Container Oral TID BM  . insulin aspart  0-9 Units Subcutaneous TID WC  . insulin glargine  8 Units Subcutaneous Daily  . levETIRAcetam  500 mg Oral BID  . mouth rinse  15 mL Mouth Rinse BID  . senna-docusate  2 tablet Oral QHS  . tamsulosin  0.4 mg Oral QPC supper   Continuous Infusions: . sodium chloride Stopped (10/20/17 1042)  . ciprofloxacin Stopped (10/20/17 0725)  . metronidazole 500 mg (10/20/17 0519)     LOS: 7 days    Time spent: 30 minutes    Pratik Darleen Crocker, DO [facility] Pager [phone]  If 7PM-7AM, please contact night-coverage [email or web address] Password [email or web address] 10/20/2017, 12:42 PM

## 2017-10-20 NOTE — Progress Notes (Signed)
Palliative: Sandra [name] is resting quietly in bed.  She is looking in the direction of the television today.  She will make eye contact, but not answer my questions in any way.  Conversation with nursing staff reveal that she will, on occasion, nod yes and no to their questions.  Nursing staff states that she prefers sweets, I encouraged them to give sweet foods if this is what she likes. Conference with son, Sandra [name].  He shares with me that he will reach out to his sister later today and discuss CODE STATUS.  We talked more about CODE STATUS options. Conference with hospitalist related to plan of care, particularly as related to nutrition. Conference with registered dietitian Sandra [name] related to nutritional intake, options for supplementation, plan of care. Conversation with son Sandra [name] related to dietary intake, supplementation.  We discussed [name] preference for sweet foods and mashed potatoes.  We also talked about her need for additional time to be fed (30 minutes), and the concern that staff at residential SNF may not have the time/ability.  I encourage Sandra [name] to work with his family for assistance in feeding. Update with hospitalist, Dr. Manuella Ghazi. 19 minutes Quinn Axe, NP Palliative Medicine Team Team Phone # [phone]

## 2017-10-20 NOTE — Progress Notes (Signed)
Subjective: Interval History: Patient is alert and in no apparent distress  Objective: Vital signs in last 24 hours: Temp:  [98.1 F (36.7 C)-99.2 F (37.3 C)] 98.1 F (36.7 C) (06/06 0400) Pulse Rate:  [88-155] 88 (06/06 0400) Resp:  [17-27] 19 (06/06 0400) BP: (87-153)/(58-76) 132/68 (06/06 0400) SpO2:  [93 %-95 %] 94 % (06/06 0400) Weight:  [85.7 kg (188 lb 15 oz)] 85.7 kg (188 lb 15 oz) (06/06 0500) Weight change:   Intake/Output from previous day: 06/05 0701 - 06/06 0700 In: 560 [P.O.:460; IV Piggyback:100] Out: 850 [Urine:850] Intake/Output this shift: No intake/output data recorded.  Patient is more alert today Chest is clear to auscultation Heart exam revealed regular rate and rhythm no murmur Extremities no edema  Lab Results: Recent Labs    10/20/17 0539  WBC 14.1*  HGB 10.2*  HCT 32.0*  PLT 337   BMET:  Recent Labs    10/19/17 0355 10/20/17 0539  NA 145 147*  K 3.6 3.5  CL 117* 119*  CO2 26 25  GLUCOSE 163* 216*  BUN 43* 34*  CREATININE 0.88 0.71  CALCIUM 7.3* 7.5*   No results for input(s): PTH in the last 72 hours. Iron Studies: No results for input(s): IRON, TIBC, TRANSFERRIN, FERRITIN in the last 72 hours.  Studies/Results: No results found.  I have reviewed the patient's current medications.  Assessment/Plan: Acute kidney injury: Possibly multifactorial including prerenal syndrome/ATN/ACE/contrast induced acute kidney injury.  Renal function has recovered. 2] hypokalemia: Her potassium remains normal. 3] hypertension: His systolic blood pressure is low normal.  4] diabetes: Her blood sugar is reasonably controlled 5] history of CVA with aphasia 6] hypernatremia: Her sodium is increasing.  This is due to lack of free water intake. 7] history of colitis Plan: 1] start one half normal saline at 55 cc/h 2] this could be discontinued once her sodium improved  3] since her renal function has recovered I will sign off and thank you for  letting me to participate in her care.    LOS: 7 days   Tynan Boesel S 10/20/2017,8:24 AM

## 2017-10-20 NOTE — Progress Notes (Signed)
PT Cancellation Note  Patient Details Name: Linden Mikes MRN: [id number] DOB: 05/21/1940   Cancelled Treatment:    Reason Eval/Treat Not Completed: Patient at procedure or test/unavailable(per nursing, pt getting sterile line placed and currently unavailable. Will check back at later time/day.)     Geraldine Solar PT, DPT

## 2017-10-20 NOTE — Progress Notes (Signed)
Inpatient Diabetes Program Recommendations  AACE/ADA: New Consensus Statement on Inpatient Glycemic Control (2015)  Target Ranges:  Prepandial:   less than 140 mg/dL      Peak postprandial:   less than 180 mg/dL (1-2 hours)      Critically ill patients:  140 - 180 mg/dL  Results for DENEKA, GREENWALT (MRN [id number]) as of 10/20/2017 08:27  Ref. Range 10/19/2017 07:37 10/19/2017 11:10 10/19/2017 12:44 10/19/2017 16:36 10/19/2017 21:40  Glucose-Capillary Latest Ref Range: 65 - 99 mg/dL 182 (H) 192 (H) 149 (H) 258 (H) 236 (H)   Review of Glycemic Control  Diabetes history: DM2 Outpatient Diabetes medications: Lantus 10 units daily, Metformin 500 mg TID Current orders for Inpatient glycemic control: Lantus 8 units daily, Novolog 0-9 units TID with meals  Inpatient Diabetes Program Recommendations:  Insulin-Correction: Please consider ordering Novolog 0-5 units QHS for bedtime correction.  Thanks, Barnie Alderman, RN, MSN, CDE Diabetes Coordinator Inpatient Diabetes Program [phone] (Team Pager from 8am to 5pm)

## 2017-10-21 ENCOUNTER — Encounter: Payer: Self-pay | Admitting: Family Medicine

## 2017-10-21 LAB — CBC
HCT: 34.8 % — ABNORMAL LOW (ref 36.0–46.0)
Hemoglobin: 11.2 g/dL — ABNORMAL LOW (ref 12.0–15.0)
MCH: 26.1 pg (ref 26.0–34.0)
MCHC: 32.2 g/dL (ref 30.0–36.0)
MCV: 81.1 fL (ref 78.0–100.0)
PLATELETS: 459 10*3/uL — AB (ref 150–400)
RBC: 4.29 MIL/uL (ref 3.87–5.11)
RDW: 18 % — ABNORMAL HIGH (ref 11.5–15.5)
WBC: 13.6 10*3/uL — ABNORMAL HIGH (ref 4.0–10.5)

## 2017-10-21 LAB — BASIC METABOLIC PANEL
Anion gap: 5 (ref 5–15)
BUN: 31 mg/dL — AB (ref 6–20)
CHLORIDE: 118 mmol/L — AB (ref 101–111)
CO2: 25 mmol/L (ref 22–32)
Calcium: 7.8 mg/dL — ABNORMAL LOW (ref 8.9–10.3)
Creatinine, Ser: 0.66 mg/dL (ref 0.44–1.00)
GFR calc Af Amer: >60 mL/min (ref >60)
GFR calc non Af Amer: >60 mL/min (ref >60)
GLUCOSE: 216 mg/dL — AB (ref 65–99)
POTASSIUM: 3.4 mmol/L — AB (ref 3.5–5.1)
Sodium: 148 mmol/L — ABNORMAL HIGH (ref 135–145)

## 2017-10-21 LAB — GLUCOSE, CAPILLARY
GLUCOSE-CAPILLARY: 206 mg/dL — AB (ref 65–99)
GLUCOSE-CAPILLARY: 193 mg/dL — AB (ref 65–99)
Glucose-Capillary: 164 mg/dL — ABNORMAL HIGH (ref 65–99)
Glucose-Capillary: 200 mg/dL — ABNORMAL HIGH (ref 65–99)

## 2017-10-21 MED ORDER — POTASSIUM CHLORIDE 20 MEQ/15ML (10%) PO SOLN
40.0000 meq | Freq: Once | ORAL | Status: AC
  Administered 2017-10-21: 40 meq via ORAL
  Filled 2017-10-21: qty 30

## 2017-10-21 NOTE — Care Management Important Message (Signed)
Important Message  Patient Details  Name: Sandra [name] MRN: [id number] Date of Birth: 1941/03/15   Medicare Important Message Given:  Yes    [name] 10/21/2017, 12:21 PM

## 2017-10-21 NOTE — Plan of Care (Signed)
  Problem: Acute Rehab PT Goals(only PT should resolve) Goal: Pt Will Go Supine/Side To Sit Flowsheets (Taken 10/21/2017 0853) Pt will go Supine/Side to Sit: with maximum assist;with moderate assist Goal: Pt Will Go Sit To Supine/Side Flowsheets (Taken 10/21/2017 0853) Pt will go Sit to Supine/Side: with moderate assist;with maximum assist Goal: Patient Will Perform Sitting Balance Flowsheets (Taken 10/21/2017 0853) Patient will perform sitting balance: with minimal assist;with min guard assist;3- 5 min;with bilateral UE support Goal: Pt Will Transfer Bed To Chair/Chair To Bed Flowsheets (Taken 10/21/2017 0853) Pt will Transfer Bed to Chair/Chair to Bed: with max assist    Geraldine Solar PT, DPT

## 2017-10-21 NOTE — Progress Notes (Signed)
Spoke with MD about possibly taking out the Sandra [name] catheter since no longer receiving IV diuresis. Verbal order to D/C catheter, and use the Purewick female catheter. Will continue to monitor.

## 2017-10-21 NOTE — Progress Notes (Signed)
PROGRESS NOTE    Sandra [name]  BOF:[id number] DOB: [birth year] DOA: 10/12/2017 PCP: Caren Macadam, MD    Brief Narrative:  77 year old female admitted to the hospital with abdominal pain. She was found to have colitis with possible fecal impaction on admission. 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 Neurology following. She also has worsening renal function which is now improving and being followed by Nephrology.  Overall renal function has improved.  Mental status is also improving, although p.o. intake remains poor.   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 Colitis.  Improved with Flagyl and Cipro DC after 7 day course of treatment on 6/6. 2. Acute renal failure-improved.  She did receive some IV contrast on admission.  Nephrology has recommended some IV fluid with half-normal saline.  We will continue to monitor with repeat BMP. 3. Partial seizures.  Resolved after Keppra. 4. Acute encephalopathy, likely metabolic.  Overall appears to be slowly improving.  ABG and ammonia were unremarkable.  Possibly related to dehydration/urinary tract infection.  At baseline she has aphasia which makes communication difficult. Based on notes, she appears to be more interactive with other staff members 5. Diabetes.  On sliding scale insulin.  Also on Lantus.  Blood sugar has been controlled. 6. Hypotension. Still having episodes of soft  blood pressures.  Cortisol normal. 7. Previous stroke.  She has residual right-sided hemiparesis and global aphasia.  Continue on Plavix.  MRI performed during this hospitalization was poor quality. 8. Possible fecal impaction.  Resolved with enemas and digital disimpaction.  9. UTI. Urinalysis indicates pyuria.  Urine culture showed no growth.  Culture was obtained while patient was already on ciprofloxacin. 10. Hypokalemia-mild. Oral replacement today with repeat labs to include Mg in am. 11. Irregular rhythm.  Noted to be sinus rhythm with PACs. 12. Severe protein calorie malnutrition.  P.o. intake remains poor.  Nutrition following with improvement in intake noted. Will continue to follow over the weekend. 13. Depression. Increased Celexa to 20mg .   DVT prophylaxis: Lovenox Code Status: Full code Family Communication: None currently at bedside Disposition Plan: Continue to monitor oral intake and allow ongoing physical therapy to ascertain readiness for discharge to inpatient rehabilitation hopefully soon.   Consultants:   Neurology  Nephrology  Procedures:  EEG:1. Frontal intermittent rhythmic delta activities are observed. This is typically seen in toxic metabolic encephalopathies.  2. Triphasic waves are also observed typically seen in metabolic encephalopathies particularly due to hepatic failure or renal failure.  Antimicrobials:   Ciprofloxacin 5/30 >6/6  Flagyl 5/30>6/6   Subjective: Patient seen and evaluated this morning.  She is somewhat more somnolent but is arousable.  Her oral intake has been increasing during her stay.  Objective: Vitals:   10/20/17 1958 10/20/17 2204 10/21/17 0632 10/21/17 0848  BP:  (!) 160/87 (!) 158/88 (!) 141/90  Pulse:  95 (!) 102 (!) 113  Resp:  18    Temp:  98.6 F (37 C) 98.4 F (36.9 C) 98.6 F (37 C)  TempSrc:  Oral Oral Oral  SpO2: (!) 88% 98% 94% 100%  Weight:      Height:        Intake/Output Summary (Last 24 hours)  at 10/21/2017 1139 Last data filed at 10/21/2017 1104 Gross per 24 hour  Intake 1407.5 ml  Output 925 ml  Net 482.5 ml   Filed Weights   10/17/17 0500 10/18/17 0400 10/20/17 0500  Weight: 80.6 kg (177 lb 11.1 oz) 81.7 kg (180 lb 1.9 oz) 85.7 kg (188 lb 15 oz)    Examination:  General exam: Alert, awake, nonverbal Respiratory system: Clear to auscultation. Respiratory effort normal. Cardiovascular system:RRR. No murmurs, rubs, gallops. Gastrointestinal system: Abdomen is nondistended, soft and nontender. No organomegaly or masses felt. Normal bowel sounds heard. Central nervous system: unable to assess due to mental status. Extremities: developing anasarca Skin: No rashes, lesions or ulcers Psychiatry: nonverbal    Data Reviewed: I have personally reviewed following labs and imaging studies  CBC: Recent Labs  Lab 10/15/17 0557 10/17/17 0534 10/20/17 0539 10/21/17 0618  WBC 13.0* 11.6* 14.1* 13.6*  HGB 10.2* 10.0* 10.2* 11.2*  HCT 32.1* 30.5* 32.0* 34.8*  MCV 83.4 81.1 82.1 81.1  PLT 329 346 337 631*   Basic Metabolic Panel: Recent Labs  Lab 10/16/17 0522 10/17/17 0534 10/18/17 0425 10/19/17 0355 10/20/17 0539 10/21/17 0618  NA 142 141 144 145 147* 148*  K 2.8* 4.0 4.0 3.6 3.5 3.4*  CL 107 112* 116* 117* 119* 118*  CO2 26 25 24 26 25 25   GLUCOSE 181* 182* 91 163* 216* 216*  BUN 61* 57* 48* 43* 34* 31*  CREATININE 2.16* 1.63* 1.10* 0.88 0.71 0.66  CALCIUM 7.4* 7.4* 7.2* 7.3* 7.5* 7.8*  MG 2.9*  --   --   --   --   --   PHOS 3.6 2.2* 2.0* 2.0* 2.0*  --    GFR: Estimated Creatinine Clearance: 60.1 mL/min (by C-G formula based on SCr of 0.66 mg/dL). Liver Function Tests: Recent Labs  Lab 10/16/17 0522 10/17/17 0534 10/18/17 0425 10/19/17 0355 10/20/17 0539  ALBUMIN 1.8* 1.6* 1.7* 1.6* 1.7*   No results for input(s): LIPASE, AMYLASE in the last 168 hours. Recent Labs  Lab 10/15/17 1130  AMMONIA 30   Coagulation Profile: No results for input(s): INR,  PROTIME in the last 168 hours. Cardiac Enzymes: No results for input(s): CKTOTAL, CKMB, CKMBINDEX, TROPONINI in the last 168 hours. BNP (last 3 results) No results for input(s): PROBNP in the last 8760 hours. HbA1C: No results for input(s): HGBA1C in the last 72 hours. CBG: Recent Labs  Lab 10/20/17 1139 10/20/17 1640 10/20/17 2207 10/21/17 0821 10/21/17 1123  GLUCAP 181* 161* 235* 206* 193*   Lipid Profile: No results for input(s): CHOL, HDL, LDLCALC, TRIG, CHOLHDL, LDLDIRECT in the last 72 hours. Thyroid Function Tests: No results for input(s): TSH, T4TOTAL, FREET4, T3FREE, THYROIDAB in the last 72 hours. Anemia Panel: No results for input(s): VITAMINB12, FOLATE, FERRITIN, TIBC, IRON, RETICCTPCT in the last 72 hours. Sepsis Labs: No results for input(s): PROCALCITON, LATICACIDVEN in the last 168 hours.  Recent Results (from the past 240 hour(s))  MRSA PCR Screening     Status: None   Collection Time: 10/13/17  6:06 AM  Result Value Ref Range Status   MRSA by PCR NEGATIVE NEGATIVE Final    Comment:  The GeneXpert MRSA Assay (FDA approved for NASAL specimens only), is one component of a comprehensive MRSA colonization surveillance program. It is not intended to diagnose MRSA infection nor to guide or monitor treatment for MRSA infections. Performed at [facility], [address] [place], [place] [address]   Culture, Urine     Status: None   Collection Time: 10/15/17 10:00 AM  Result Value Ref Range Status   Specimen Description   Final    URINE, CLEAN CATCH Performed at [facility], [address], [place], [place] [address]    Special Requests   Final    NONE Performed at [facility], [address] [place],  [address]    Culture   Final    NO GROWTH Performed at [facility], [place] [address] [place],  [address]    Report Status 10/16/2017 FINAL  Final         Radiology Studies: No results found.      Scheduled  Meds: . atorvastatin  40 mg Oral q1800  . bethanechol  25 mg Oral TID  . citalopram  20 mg Oral Daily  . clopidogrel  75 mg Oral Q breakfast  . enoxaparin (LOVENOX) injection  40 mg Subcutaneous Q24H  . feeding supplement (PRO-STAT SUGAR FREE 64)  30 mL Oral TID BM  . insulin aspart  0-9 Units Subcutaneous TID WC  . insulin glargine  8 Units Subcutaneous Daily  . levETIRAcetam  500 mg Oral BID  . mouth rinse  15 mL Mouth Rinse BID  . potassium chloride  40 mEq Oral Once  . senna-docusate  2 tablet Oral QHS  . tamsulosin  0.4 mg Oral QPC supper   Continuous Infusions: . sodium chloride 50 mL/hr at 10/21/17 0104     LOS: 8 days    Time spent: 30 minutes    Safari Cinque Darleen Crocker, DO [facility] Pager [phone]  If 7PM-7AM, please contact night-coverage [email or web address] Password [facility] 10/21/2017, 11:39 AM

## 2017-10-21 NOTE — Progress Notes (Signed)
  Speech Language Pathology Treatment: Dysphagia  Patient Details Name: Sandra [name] MRN: [id number] DOB: 1941/02/05 Today's Date: 10/21/2017 Time: 1030-1100 SLP Time Calculation (min) (ACUTE ONLY): 30 min  Assessment / Plan / Recommendation Clinical Impression  Dysphagia treatment provided to check for diet tolerance/ education. Pt asleep upon arrival but easily aroused, was unable to follow commands during this treatment session, frequently holding out L arm and grimacing as if in pain- RN informed. Pt did tolerate puree/ thin liquid consistencies, including meds with RN; pt demonstrated prolonged bolus formation and immediate cough following initial bite of puree but no further s/s of aspiration. Daughter-in-law and RN in room- discussed monitoring alertness during meals, providing small bites/ sips at a time, offering PO intake numerous times throughout the day rather than just 3 meals, offering sweets which she likes the most. Given lethargy, recommend continuing dysphagia 1 diet, thin liquids, meds crushed in puree, full supervision/ assistance for feeding. Will continue to follow for diet tolerance/ continued education.  HPI HPI: HPI: This patient is a 77 year old female with history of prior CVA with global aphasia, diabetes, hypertension, peripheral artery disease.  She was sent from her extended care facility today for evaluation of vomiting.  I am told she has vomited 6 or 7 times this afternoon.  The care providers felt as though she was having abdominal pain and were concerned about a bowel obstruction.  Patient adds very little history secondary to prior CVA/aphasia.      SLP Plan  Continue with current plan of care       Recommendations  Diet recommendations: Dysphagia 1 (puree);Thin liquid Liquids provided via: Straw Medication Administration: Crushed with puree Supervision: Staff to assist with self feeding;Full supervision/cueing for compensatory strategies Compensations:  Minimize environmental distractions;Slow rate;Small sips/bites;Other (Comment)(check that pt swallowed before providing more, monitor alert) Postural Changes and/or Swallow Maneuvers: Seated upright 90 degrees                Oral Care Recommendations: Oral care BID Follow up Recommendations: Skilled Nursing facility SLP Visit Diagnosis: Dysphagia, unspecified (R13.10) Plan: Continue with current plan of care       GO                Kern Reap, [place], [place] 10/21/2017, 11:09 AM

## 2017-10-21 NOTE — Evaluation (Signed)
Physical Therapy Evaluation Patient Details Name: Sandra [name] MRN: [id number] DOB: [birth year] Today's Date: 10/21/2017   History of Present Illness  Sandra [name]  is a 77 y.o. female,with a history of prior CVA with global aphasia, diabetes mellitus, hypertension, peripheral artery disease was sent from skilled care facility for evaluation of vomiting.  There was concern for small bowel obstruction.  In the ED CT scan of the abdomen and pelvis showed sigmoid colitis. Patient has global aphasia, nonverbal, unable to provide any history  Clinical Impression  Pt received in bed and had to be awoken to participate due to lethargy. Pt admitted with above diagnosis. Pt has been in SNF for last 3 months s/p CVA in January 2019 and presents with continued global aphasia and R hemiparesis. Once alert, pt max x2 assist for supine <> sit. She tolerated sitting EOB x5 mins. She required min-mod A to maintain sitting balance as she demo'd left posterolateral lean. Pt able to follow 1 step commands infrequently. PT recommending return to SNF upon d/c to improve pt's overall functional mobility. Pt's family requesting PNC if possible.       Follow Up Recommendations SNF    Equipment Recommendations  None recommended by PT    Recommendations for Other Services       Precautions / Restrictions Precautions Precautions: Fall Restrictions Weight Bearing Restrictions: No      Mobility  Bed Mobility Overal bed mobility: Needs Assistance Bed Mobility: Supine to Sit;Sit to Supine     Supine to sit: Max assist;+2 for physical assistance;HOB elevated Sit to supine: Max assist;+2 for physical assistance      Transfers                 General transfer comment: n/a - did not attempt due to deficits in bed mobility and sitting balance  Ambulation/Gait                Stairs            Wheelchair Mobility    Modified Rankin (Stroke Patients Only)       Balance  Overall balance assessment: Needs assistance Sitting-balance support: Feet supported;Bilateral upper extremity supported Sitting balance-Leahy Scale: Poor Sitting balance - Comments: sitting EOB - max verbal and tactile cues to sit up tall and decrease posterolateral lean; min-mod A to maintain upright sitting posture Postural control: Posterior lean;Left lateral lean                                   Pertinent Vitals/Pain Pain Assessment: Faces Faces Pain Scale: Hurts a little bit Pain Location: BLE when performing AROM/PROM at EOB Pain Intervention(s): Limited activity within patient's tolerance;Monitored during session;Repositioned    Home Living Family/patient expects to be discharged to:: Skilled nursing facility                 Additional Comments: Pt from [place] s/p CVA in January 2019. Per chart review and history obtained from family, pt had been able to perform stand pivot transfers and/or slide board transfers at facilty but recently had been requiring max assist for mobility just prior to this admission to [facility].     Prior Function                 Hand Dominance   Dominant Hand: Right    Extremity/Trunk Assessment   Upper Extremity Assessment Upper Extremity Assessment: Generalized weakness;RUE deficits/detail RUE Deficits /  Details: R hemiparesis since CVA    Lower Extremity Assessment Lower Extremity Assessment: Generalized weakness;RLE deficits/detail RLE Deficits / Details: R hemiparesis since CVA    Cervical / Trunk Assessment Cervical / Trunk Assessment: Kyphotic  Communication   Communication: Receptive difficulties;Expressive difficulties  Cognition Arousal/Alertness: Lethargic Behavior During Therapy: Flat affect Overall Cognitive Status: History of cognitive impairments - at baseline                                 General Comments: global aphasia since CVA in January 2019      General Comments       Exercises Total Joint Exercises Long Arc Quad: PROM;AAROM;Right;Left;5 reps;Seated;Limitations Long [employer] Limitations: PROM for RLE, AAROM for LLE   Assessment/Plan    PT Assessment Patient needs continued PT services  PT Problem List Decreased strength;Decreased balance;Decreased activity tolerance;Decreased mobility;Decreased cognition       PT Treatment Interventions Functional mobility training;Therapeutic activities;Therapeutic exercise;Balance training;Neuromuscular re-education;Cognitive remediation;Patient/family education;Manual techniques    PT Goals (Current goals can be found in the Care Plan section)  Acute Rehab PT Goals Patient Stated Goal: to improve PT Goal Formulation: With patient/family Time For Goal Achievement: 10/28/17 Potential to Achieve Goals: Fair    Frequency Min 4X/week   Barriers to discharge        Co-evaluation               AM-PAC PT "6 Clicks" Daily Activity  Outcome Measure Difficulty turning over in bed (including adjusting bedclothes, sheets and blankets)?: Unable Difficulty moving from lying on back to sitting on the side of the bed? : Unable Difficulty sitting down on and standing up from a chair with arms (e.g., wheelchair, bedside commode, etc,.)?: Unable Help needed moving to and from a bed to chair (including a wheelchair)?: Total Help needed walking in hospital room?: Total Help needed climbing 3-5 steps with a railing? : Total 6 Click Score: 6    End of Session Equipment Utilized During Treatment: Oxygen Activity Tolerance: Patient tolerated treatment well;Patient limited by lethargy Patient left: in bed;with call bell/phone within reach;with bed alarm set Nurse Communication: Mobility status;Need for lift equipment PT Visit Diagnosis: Muscle weakness (generalized) (M62.81);Hemiplegia and hemiparesis Hemiplegia - Right/Left: Right Hemiplegia - dominant/non-dominant: Dominant Hemiplegia - caused by: Cerebral  infarction    Time: 9767-3419 PT Time Calculation (min) (ACUTE ONLY): 17 min   Charges:   PT Evaluation $PT Eval Low Complexity: 1 Low     PT G Codes:           Geraldine Solar PT, DPT

## 2017-10-21 NOTE — Clinical Social Work Note (Signed)
LCSW following. Pt discussed in Progression. Per MD, pt will remain hospitalized through the weekend. Anticipating dc back to SNF early next week if pt continues to improve.

## 2017-10-22 LAB — GLUCOSE, CAPILLARY
GLUCOSE-CAPILLARY: 183 mg/dL — AB (ref 65–99)
GLUCOSE-CAPILLARY: 79 mg/dL (ref 65–99)
Glucose-Capillary: 166 mg/dL — ABNORMAL HIGH (ref 65–99)
Glucose-Capillary: 74 mg/dL (ref 65–99)

## 2017-10-22 LAB — BASIC METABOLIC PANEL
Anion gap: 5 (ref 5–15)
BUN: 38 mg/dL — ABNORMAL HIGH (ref 6–20)
CO2: 23 mmol/L (ref 22–32)
CREATININE: 0.6 mg/dL (ref 0.44–1.00)
Calcium: 7.7 mg/dL — ABNORMAL LOW (ref 8.9–10.3)
Chloride: 119 mmol/L — ABNORMAL HIGH (ref 101–111)
GFR calc non Af Amer: >60 mL/min (ref >60)
Glucose, Bld: 202 mg/dL — ABNORMAL HIGH (ref 65–99)
Potassium: 3.4 mmol/L — ABNORMAL LOW (ref 3.5–5.1)
SODIUM: 147 mmol/L — AB (ref 135–145)

## 2017-10-22 LAB — MAGNESIUM: Magnesium: 1.9 mg/dL (ref 1.7–2.4)

## 2017-10-22 MED ORDER — INSULIN ASPART 100 UNIT/ML ~~LOC~~ SOLN
0.0000 [IU] | Freq: Three times a day (TID) | SUBCUTANEOUS | Status: DC
  Administered 2017-10-22: 3 [IU] via SUBCUTANEOUS
  Administered 2017-10-24 (×2): 2 [IU] via SUBCUTANEOUS
  Administered 2017-10-25: 3 [IU] via SUBCUTANEOUS
  Administered 2017-10-25: 8 [IU] via SUBCUTANEOUS
  Administered 2017-10-26: 3 [IU] via SUBCUTANEOUS
  Administered 2017-10-26: 8 [IU] via SUBCUTANEOUS
  Administered 2017-10-27: 5 [IU] via SUBCUTANEOUS
  Administered 2017-10-27: 2 [IU] via SUBCUTANEOUS
  Administered 2017-10-28 (×2): 5 [IU] via SUBCUTANEOUS
  Administered 2017-10-28: 3 [IU] via SUBCUTANEOUS
  Administered 2017-10-29 (×3): 2 [IU] via SUBCUTANEOUS

## 2017-10-22 MED ORDER — INSULIN ASPART 100 UNIT/ML ~~LOC~~ SOLN
4.0000 [IU] | Freq: Three times a day (TID) | SUBCUTANEOUS | Status: DC
  Administered 2017-10-22: 4 [IU] via SUBCUTANEOUS

## 2017-10-22 MED ORDER — INSULIN ASPART 100 UNIT/ML ~~LOC~~ SOLN: 0.0000 [IU] | Freq: Every day | SUBCUTANEOUS | Status: DC

## 2017-10-22 MED ORDER — POTASSIUM CHLORIDE 20 MEQ/15ML (10%) PO SOLN
40.0000 meq | Freq: Once | ORAL | Status: AC
  Administered 2017-10-22: 40 meq via ORAL
  Filled 2017-10-22: qty 30

## 2017-10-22 NOTE — Progress Notes (Signed)
PROGRESS NOTE    Sandra [name]  WNU:[id number] DOB: 16-Mar-1941 DOA: 10/12/2017 PCP: Caren Macadam, MD    Brief Narrative:  77 year old female admitted to the hospital with abdominal pain. She was found to have colitis with possible fecal impaction on admission. 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 Neurology following. She also has worsening renal function which is now improving and being followed by Nephrology.  Overall renal function has improved.  Mental status is also improving, although p.o. intake remains poor.   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 Colitis.  Improved with Flagyl and Cipro DC after 7 day course of treatment on 6/6. 2. Acute renal failure-improved.  She did receive some IV contrast on admission.  Nephrology has recommended some IV fluid with half-normal saline.  We will continue to monitor with repeat BMP. 3. Partial seizures.  Resolved after Keppra. 4. Acute encephalopathy, likely metabolic.  Overall appears to be slowly improving.  ABG and ammonia were unremarkable.  Possibly related to dehydration/urinary tract infection.  At baseline she has aphasia which makes communication difficult. Based on notes, she appears to be more interactive with other staff members 5. Diabetes.  On sliding scale insulin which has been increased today.  Also on Lantus.  Blood sugar has been mildly  hyperglycemic. 6. Hypotension. Still having episodes of soft blood pressures.  Cortisol normal. 7. Previous stroke.  She has residual right-sided hemiparesis and global aphasia.  Continue on Plavix.  MRI performed during this hospitalization was poor quality. 8. Possible fecal impaction.  Resolved with enemas and digital disimpaction.  9. UTI. Urinalysis indicates pyuria.  Urine culture showed no growth.  Culture was obtained while patient was already on ciprofloxacin. 10. Hypokalemia-mild. Oral replacement again today. 11. Irregular rhythm.  Noted to be sinus rhythm with PACs. 12. Severe protein calorie malnutrition.  P.o. intake remains poor.  Nutrition following with improvement in intake noted. Will continue to follow over the weekend. 13. Depression. Increased Celexa to 20mg .   DVT prophylaxis: Lovenox Code Status: Full code Family Communication: None currently at bedside Disposition Plan: Continue to monitor oral intake and allow ongoing physical therapy to ascertain readiness for discharge to inpatient rehabilitation hopefully soon.   Consultants:   Neurology  Nephrology  Procedures:  EEG:1. Frontal intermittent rhythmic delta activities are observed. This is typically seen in toxic metabolic encephalopathies.  2. Triphasic waves are also observed typically seen in metabolic encephalopathies particularly due to hepatic failure or renal failure.  Antimicrobials:   Ciprofloxacin 5/30 >6/6  Flagyl 5/30>6/6   Subjective: Patient seen and evaluated this morning.  Her oral intake is fluctuating. Her arms are swollen this morning. No acute events overnight.  Objective: Vitals:   10/21/17 0848 10/21/17 1449 10/21/17 2134 10/22/17 0555  BP: (!) 141/90 (!) 170/68 (!) 158/100 (!) 142/75  Pulse: (!) 113 81 94 88  Resp:  14 20 20  Temp: 98.6 F (37 C) 98.8 F (37.1 C) 98.4 F (36.9 C) 98.3 F (36.8 C)  TempSrc: Oral Oral Oral Oral  SpO2: 100% 100% 100% 98%  Weight:       Height:        Intake/Output Summary (Last 24 hours) at 10/22/2017 1031 Last data filed at 10/22/2017 0900 Gross per 24 hour  Intake 1740 ml  Output 650 ml  Net 1090 ml   Filed Weights   10/17/17 0500 10/18/17 0400 10/20/17 0500  Weight: 80.6 kg (177 lb 11.1 oz) 81.7 kg (180 lb 1.9 oz) 85.7 kg (188 lb 15 oz)    Examination:  General exam: Alert, awake, nonverbal Respiratory system: Clear to auscultation. Respiratory effort normal. Cardiovascular system:RRR. No murmurs, rubs, gallops. Gastrointestinal system: Abdomen is nondistended, soft and nontender. No organomegaly or masses felt. Normal bowel sounds heard. Central nervous system: unable to assess due to mental status. Extremities: developing anasarca Skin: No rashes, lesions or ulcers Psychiatry: nonverbal    Data Reviewed: I have personally reviewed following labs and imaging studies  CBC: Recent Labs  Lab 10/17/17 0534 10/20/17 0539 10/21/17 0618  WBC 11.6* 14.1* 13.6*  HGB 10.0* 10.2* 11.2*  HCT 30.5* 32.0* 34.8*  MCV 81.1 82.1 81.1  PLT 346 337 147*   Basic Metabolic Panel: Recent Labs  Lab 10/16/17 0522 10/17/17 0534 10/18/17 0425 10/19/17 0355 10/20/17 0539 10/21/17 0618 10/22/17 0805  NA 142 141 144 145 147* 148* 147*  K 2.8* 4.0 4.0 3.6 3.5 3.4* 3.4*  CL 107 112* 116* 117* 119* 118* 119*  CO2 26 25 24 26 25 25 23   GLUCOSE 181* 182* 91 163* 216* 216* 202*  BUN 61* 57* 48* 43* 34* 31* 38*  CREATININE 2.16* 1.63* 1.10* 0.88 0.71 0.66 0.60  CALCIUM 7.4* 7.4* 7.2* 7.3* 7.5* 7.8* 7.7*  MG 2.9*  --   --   --   --   --  1.9  PHOS 3.6 2.2* 2.0* 2.0* 2.0*  --   --    GFR: Estimated Creatinine Clearance: 60.1 mL/min (by C-G formula based on SCr of 0.6 mg/dL). Liver Function Tests: Recent Labs  Lab 10/16/17 0522 10/17/17 0534 10/18/17 0425 10/19/17 0355 10/20/17 0539  ALBUMIN 1.8* 1.6* 1.7* 1.6* 1.7*   No results for input(s): LIPASE, AMYLASE in the last 168 hours. Recent Labs  Lab  10/15/17 1130  AMMONIA 30   Coagulation Profile: No results for input(s): INR, PROTIME in the last 168 hours. Cardiac Enzymes: No results for input(s): CKTOTAL, CKMB, CKMBINDEX, TROPONINI in the last 168 hours. BNP (last 3 results) No results for input(s): PROBNP in the last 8760 hours. HbA1C: No results for input(s): HGBA1C in the last 72 hours. CBG: Recent Labs  Lab 10/21/17 0821 10/21/17 1123 10/21/17 1707 10/21/17 2130 10/22/17 0732  GLUCAP 206* 193* 164* 200* 183*   Lipid Profile: No results for input(s): CHOL, HDL, LDLCALC, TRIG, CHOLHDL, LDLDIRECT in the last 72 hours. Thyroid Function Tests: No results for input(s): TSH, T4TOTAL, FREET4, T3FREE, THYROIDAB in the last 72 hours. Anemia Panel: No results for input(s): VITAMINB12, FOLATE, FERRITIN, TIBC, IRON, RETICCTPCT in the last 72 hours. Sepsis Labs: No results for input(s): PROCALCITON, LATICACIDVEN in the last 168 hours.  Recent Results (from the past 240 hour(s))  MRSA PCR Screening     Status: None   Collection Time: 10/13/17  6:06 AM  Result Value Ref Range Status   MRSA by PCR NEGATIVE NEGATIVE Final    Comment:  The GeneXpert MRSA Assay (FDA approved for NASAL specimens only), is one component of a comprehensive MRSA colonization surveillance program. It is not intended to diagnose MRSA infection nor to guide or monitor treatment for MRSA infections. Performed at [facility], [address] [place], [place] [address]   Culture, Urine     Status: None   Collection Time: 10/15/17 10:00 AM  Result Value Ref Range Status   Specimen Description   Final    URINE, CLEAN CATCH Performed at [facility], [address], [place], [place] [address]    Special Requests   Final    NONE Performed at [facility], [address] [place], [place] [address]    Culture   Final    NO GROWTH Performed at [facility], [place] [address], [place], [place] [address]    Report Status 10/16/2017 FINAL   Final         Radiology Studies: No results found.      Scheduled Meds: . atorvastatin  40 mg Oral q1800  . bethanechol  25 mg Oral TID  . citalopram  20 mg Oral Daily  . clopidogrel  75 mg Oral Q breakfast  . enoxaparin (LOVENOX) injection  40 mg Subcutaneous Q24H  . feeding supplement (PRO-STAT SUGAR FREE 64)  30 mL Oral TID BM  . insulin aspart  0-15 Units Subcutaneous TID WC  . insulin aspart  0-5 Units Subcutaneous QHS  . insulin aspart  4 Units Subcutaneous TID WC  . insulin glargine  8 Units Subcutaneous Daily  . levETIRAcetam  500 mg Oral BID  . mouth rinse  15 mL Mouth Rinse BID  . potassium chloride  40 mEq Oral Once  . senna-docusate  2 tablet Oral QHS  . tamsulosin  0.4 mg Oral QPC supper   Continuous Infusions: . sodium chloride 50 mL/hr at 10/21/17 0104     LOS: 9 days    Time spent: 30 minutes    Blakely Gluth D Manuella Ghazi, DO [facility] Pager [phone]  If 7PM-7AM, please contact night-coverage [email or web address] Password [email or web address] 10/22/2017, 10:31 AM

## 2017-10-23 LAB — GLUCOSE, CAPILLARY
GLUCOSE-CAPILLARY: 47 mg/dL — AB (ref 65–99)
GLUCOSE-CAPILLARY: 96 mg/dL (ref 65–99)
GLUCOSE-CAPILLARY: 76 mg/dL (ref 65–99)
GLUCOSE-CAPILLARY: 86 mg/dL (ref 65–99)
Glucose-Capillary: 75 mg/dL (ref 65–99)
Glucose-Capillary: 92 mg/dL (ref 65–99)

## 2017-10-23 MED ORDER — METOPROLOL TARTRATE 25 MG PO TABS
25.0000 mg | ORAL_TABLET | Freq: Two times a day (BID) | ORAL | Status: DC
  Administered 2017-10-23 – 2017-10-29 (×11): 25 mg via ORAL
  Filled 2017-10-23 (×11): qty 1

## 2017-10-23 MED ORDER — METOPROLOL TARTRATE 5 MG/5ML IV SOLN
5.0000 mg | Freq: Once | INTRAVENOUS | Status: AC
  Administered 2017-10-23: 5 mg via INTRAVENOUS
  Filled 2017-10-23: qty 5

## 2017-10-23 MED ORDER — DEXTROSE 50 % IV SOLN
50.0000 mL | Freq: Once | INTRAVENOUS | Status: AC
  Administered 2017-10-23: 50 mL via INTRAVENOUS

## 2017-10-23 MED ORDER — LEVETIRACETAM 100 MG/ML PO SOLN
500.0000 mg | Freq: Two times a day (BID) | ORAL | Status: DC
  Administered 2017-10-23 – 2017-10-29 (×12): 500 mg via ORAL
  Filled 2017-10-23 (×17): qty 5

## 2017-10-23 MED ORDER — DEXTROSE 50 % IV SOLN
INTRAVENOUS | Status: AC
  Filled 2017-10-23: qty 50

## 2017-10-23 NOTE — Progress Notes (Addendum)
Hypoglycemic Event  CBG: 47 Treatment: Dextrose 18ml   Symptoms: fatigue, arousable by voice   Follow-up CBG: Time:0808 CBG Result:96  Possible Reasons for Event: poor food and fluid intake Comments/MD notified:Shah    Sandra [name]

## 2017-10-23 NOTE — Progress Notes (Signed)
PROGRESS NOTE    Sandra [name]  UKG:[id number] DOB: 1940/09/19 DOA: 10/12/2017 PCP: Caren Macadam, MD    Brief Narrative:  77 year old female admitted to the hospital with abdominal pain. She was found to have colitis with possible fecal impaction on admission. 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 Neurology following. She also has worsening renal function which is now improving and being followed by Nephrology.  Overall renal function has improved.  Mental status is also improving, although p.o. intake continues to remain poor.   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 Colitis.  Improved with Flagyl and Cipro DC after 7 day course of treatment on 6/6. 2. Acute renal failure-improved.  She did receive some IV contrast on admission.  Nephrology has recommended some IV fluid with half-normal saline.  We will continue to monitor with repeat BMP. 3. Vtach. Transient 4-beat run noted on tele. Continue to monitor on tele and order 2D echo with no prior LVEF reduction noted. Metoprolol started for rate control. 4. Partial seizures.  Resolved after Keppra. 5. Acute encephalopathy, likely metabolic.  Overall appears to be slowly improving.  ABG and ammonia were unremarkable.  Possibly related to dehydration/urinary tract infection.  At baseline she has aphasia which makes communication difficult. Based on notes, she appears to be more  interactive with other staff members 6. Diabetes.  Noted hypoglycemia for which I have discontinued long-acting insulin and mealtime insulin.  Continue sliding scale coverage. 7. Hypotension. Still having episodes of soft blood pressures.  Cortisol normal. 8. Previous stroke.  She has residual right-sided hemiparesis and global aphasia.  Continue on Plavix.  MRI performed during this hospitalization was poor quality. 9. Possible fecal impaction.  Resolved with enemas and digital disimpaction.  10. UTI. Urinalysis indicates pyuria.  Urine culture showed no growth.  Culture was obtained while patient was already on ciprofloxacin. 11. Hypokalemia-mild.  Repeat labs pending. 12. Severe protein calorie malnutrition.  P.o. intake remains poor.  Nutrition following with improvement in intake noted. Will continue to follow over the weekend. 13. Depression. Increased Celexa to 20mg .   DVT prophylaxis: Lovenox Code Status: Full code Family Communication: None currently at bedside Disposition Plan: Continue to monitor oral intake and allow ongoing physical therapy to ascertain readiness for discharge to inpatient rehabilitation hopefully soon.   Consultants:   Neurology  Nephrology  Procedures:  EEG:1. Frontal intermittent rhythmic delta activities are observed. This is typically seen in toxic metabolic encephalopathies.  2. Triphasic waves are also observed typically seen in metabolic encephalopathies particularly due to hepatic failure or renal failure.  Antimicrobials:   Ciprofloxacin 5/30 >6/6  Flagyl 5/30>6/6   Subjective: Patient seen and evaluated this morning.  Patient was noted to have a 4 beat run of V. tach on telemetry and some hypoglycemia this morning which resolved after administration of D50.  No other acute events noted.  Objective: Vitals:  10/22/17 0555 10/22/17 1354 10/22/17 2202 10/23/17 0544  BP: (!) 142/75 (!) 149/79 (!) 102/41 (!) 142/65  Pulse: 88 76 93 (!)  102  Resp: 20 18 18 18   Temp: 98.3 F (36.8 C)  98.7 F (37.1 C) 97.7 F (36.5 C)  TempSrc: Oral  Oral Oral  SpO2: 98% 96% 95%   Weight:      Height:        Intake/Output Summary (Last 24 hours) at 10/23/2017 1142 Last data filed at 10/23/2017 0400 Gross per 24 hour  Intake 1300 ml  Output 450 ml  Net 850 ml   Filed Weights   10/17/17 0500 10/18/17 0400 10/20/17 0500  Weight: 80.6 kg (177 lb 11.1 oz) 81.7 kg (180 lb 1.9 oz) 85.7 kg (188 lb 15 oz)    Examination:  General exam: Alert, awake, nonverbal Respiratory system: Clear to auscultation. Respiratory effort normal. Cardiovascular system:RRR. No murmurs, rubs, gallops. Gastrointestinal system: Abdomen is nondistended, soft and nontender. No organomegaly or masses felt. Normal bowel sounds heard. Central nervous system: unable to assess due to mental status. Extremities: developing anasarca Skin: No rashes, lesions or ulcers Psychiatry: nonverbal    Data Reviewed: I have personally reviewed following labs and imaging studies  CBC: Recent Labs  Lab 10/17/17 0534 10/20/17 0539 10/21/17 0618  WBC 11.6* 14.1* 13.6*  HGB 10.0* 10.2* 11.2*  HCT 30.5* 32.0* 34.8*  MCV 81.1 82.1 81.1  PLT 346 337 941*   Basic Metabolic Panel: Recent Labs  Lab 10/17/17 0534 10/18/17 0425 10/19/17 0355 10/20/17 0539 10/21/17 0618 10/22/17 0805  NA 141 144 145 147* 148* 147*  K 4.0 4.0 3.6 3.5 3.4* 3.4*  CL 112* 116* 117* 119* 118* 119*  CO2 25 24 26 25 25 23   GLUCOSE 182* 91 163* 216* 216* 202*  BUN 57* 48* 43* 34* 31* 38*  CREATININE 1.63* 1.10* 0.88 0.71 0.66 0.60  CALCIUM 7.4* 7.2* 7.3* 7.5* 7.8* 7.7*  MG  --   --   --   --   --  1.9  PHOS 2.2* 2.0* 2.0* 2.0*  --   --    GFR: Estimated Creatinine Clearance: 60.1 mL/min (by C-G formula based on SCr of 0.6 mg/dL). Liver Function Tests: Recent Labs  Lab 10/17/17 0534 10/18/17 0425 10/19/17 0355 10/20/17 0539  ALBUMIN 1.6* 1.7* 1.6* 1.7*   No results for input(s):  LIPASE, AMYLASE in the last 168 hours. No results for input(s): AMMONIA in the last 168 hours. Coagulation Profile: No results for input(s): INR, PROTIME in the last 168 hours. Cardiac Enzymes: No results for input(s): CKTOTAL, CKMB, CKMBINDEX, TROPONINI in the last 168 hours. BNP (last 3 results) No results for input(s): PROBNP in the last 8760 hours. HbA1C: No results for input(s): HGBA1C in the last 72 hours. CBG: Recent Labs  Lab 10/22/17 1627 10/22/17 2205 10/23/17 0740 10/23/17 0808 10/23/17 1110  GLUCAP 74 79 47* 96 76   Lipid Profile: No results for input(s): CHOL, HDL, LDLCALC, TRIG, CHOLHDL, LDLDIRECT in the last 72 hours. Thyroid Function Tests: No results for input(s): TSH, T4TOTAL, FREET4, T3FREE, THYROIDAB in the last 72 hours. Anemia Panel: No results for input(s): VITAMINB12, FOLATE, FERRITIN, TIBC, IRON, RETICCTPCT in the last 72 hours. Sepsis Labs: No results for input(s): PROCALCITON, LATICACIDVEN in the last 168 hours.  Recent Results (from the past 240 hour(s))  Culture, Urine     Status: None   Collection Time: 10/15/17 10:00 AM  Result Value Ref Range Status   Specimen  Description   Final    URINE, CLEAN CATCH Performed at [facility], [address], [place], [place] [address]    Special Requests   Final    NONE Performed at [facility], [address] [place], [place] [address]    Culture   Final    NO GROWTH Performed at [facility], [place] [address] [place], [place] [address]    Report Status 10/16/2017 FINAL  Final         Radiology Studies: No results found.      Scheduled Meds: . atorvastatin  40 mg Oral q1800  . bethanechol  25 mg Oral TID  . citalopram  20 mg Oral Daily  . clopidogrel  75 mg Oral Q breakfast  . enoxaparin (LOVENOX) injection  40 mg Subcutaneous Q24H  . feeding supplement (PRO-STAT SUGAR FREE 64)  30 mL Oral TID BM  . insulin aspart  0-15 Units Subcutaneous TID WC  . insulin aspart  0-5 Units  Subcutaneous QHS  . levETIRAcetam  500 mg Oral BID  . mouth rinse  15 mL Mouth Rinse BID  . metoprolol tartrate  25 mg Oral BID  . senna-docusate  2 tablet Oral QHS  . tamsulosin  0.4 mg Oral QPC supper   Continuous Infusions: . sodium chloride 50 mL/hr at 10/21/17 0104     LOS: 10 days    Time spent: 30 minutes    Myrene Bougher Darleen Crocker, DO [facility] Pager [phone]  If 7PM-7AM, please contact night-coverage [email or web address] Password [email or web address] 10/23/2017, 11:42 AM

## 2017-10-24 ENCOUNTER — Inpatient Hospital Stay (HOSPITAL_COMMUNITY): Payer: Medicare Other

## 2017-10-24 LAB — CBC
HCT: 28.2 % — ABNORMAL LOW (ref 36.0–46.0)
Hemoglobin: 9.3 g/dL — ABNORMAL LOW (ref 12.0–15.0)
MCH: 26.8 pg (ref 26.0–34.0)
MCHC: 33 g/dL (ref 30.0–36.0)
MCV: 81.3 fL (ref 78.0–100.0)
Platelets: 580 10*3/uL — ABNORMAL HIGH (ref 150–400)
RBC: 3.47 MIL/uL — ABNORMAL LOW (ref 3.87–5.11)
RDW: 17.9 % — ABNORMAL HIGH (ref 11.5–15.5)
WBC: 7.2 10*3/uL (ref 4.0–10.5)

## 2017-10-24 LAB — BASIC METABOLIC PANEL
Anion gap: 4 — ABNORMAL LOW (ref 5–15)
BUN: 41 mg/dL — ABNORMAL HIGH (ref 6–20)
CO2: 24 mmol/L (ref 22–32)
Calcium: 7.5 mg/dL — ABNORMAL LOW (ref 8.9–10.3)
Chloride: 124 mmol/L — ABNORMAL HIGH (ref 101–111)
Creatinine, Ser: 0.57 mg/dL (ref 0.44–1.00)
GFR calc Af Amer: >60 mL/min (ref >60)
GFR calc non Af Amer: >60 mL/min (ref >60)
Glucose, Bld: 116 mg/dL — ABNORMAL HIGH (ref 65–99)
Potassium: 3.7 mmol/L (ref 3.5–5.1)
Sodium: 152 mmol/L — ABNORMAL HIGH (ref 135–145)

## 2017-10-24 LAB — GLUCOSE, CAPILLARY
GLUCOSE-CAPILLARY: 98 mg/dL (ref 65–99)
GLUCOSE-CAPILLARY: 140 mg/dL — AB (ref 65–99)
GLUCOSE-CAPILLARY: 141 mg/dL — AB (ref 65–99)
GLUCOSE-CAPILLARY: 120 mg/dL — AB (ref 65–99)
Glucose-Capillary: 87 mg/dL (ref 65–99)

## 2017-10-24 MED ORDER — POTASSIUM CL IN DEXTROSE 5% 20 MEQ/L IV SOLN
20.0000 meq | INTRAVENOUS | Status: AC
  Administered 2017-10-24 (×2): 20 meq via INTRAVENOUS
  Filled 2017-10-24: qty 1000

## 2017-10-24 NOTE — Care Management Important Message (Signed)
Important Message  Patient Details  Name: Sandra [name] MRN: [id number] Date of Birth: Sep 22, 1940   Medicare Important Message Given:  Yes    [name] 10/24/2017, 11:15 AM

## 2017-10-24 NOTE — Progress Notes (Signed)
Palliative: Sandra [name] is lying quietly in bed.  She will make but not keep eye contact.  She looks relatively comfortable, but acutely/chronically ill.  She is unable to support herself upright in bed.  There is no family at bedside at this time. I am stopped by her son Sandra [name] in the hallway.  We discussed her events over the weekend including diarrhea, and V. tach.  Family elects DNR, continue to treat the treatable.  Sandra [name] states some of his family members are coming to visit Sandra [name].  We talked about power of attorney, both healthcare and durable.   Consultation with hospitalist, Dr. Brigitte Pulse related to weekend events, plan of care, DNR. 25 minutes Quinn Axe, NP Palliative Medicine Team Team Phone # [phone]

## 2017-10-24 NOTE — Progress Notes (Signed)
Nutrition Follow-up   INTERVENTION:  Calorie count completed see RD note on 6/6.  Decrease Magic cup to 1 per meal- pt is not eating them very well now (a few bites)  Assistance with feeding all meals  Provide education and  support to patent and family  NUTRITION DIAGNOSIS:  Severe acute malnutrition related to colitis, AKI, obstipation as evidenced by documented and reported poor food and fluid acceptance-oral intake 0-25% of meals >5 days. Mild muscle loss clavicles, temples and legs.   GOAL:   Patient will meet greater than or equal to 90% of their needs; if feasible given pt loss of desire to eat or drink   MONITOR:   PO intake, Supplement acceptance, Weight trends  REASON FOR ASSESSMENT:   Poor po intake   ASSESSMENT:   77 y/o female with Hx of constipation, DM2, GI bleed, high cholesterol, HTN, rectal bleeding, and stroke with global aphasia.  Pt is nonverbal.  Admitted for abdominal pain and vomiting.  Low Braden of 12. Fecal impaction on admission. Food intake fair and poor fluid intake at SNF 2 weeks leading up to hospital admission.  Minimal po intake 0-10% of meals since admission on 5/29. Discussed prior intake with son. Patient on a Mechanical diet at facility. Patient has poor acceptance of the Ensure Enlive.  Talked with nurse and NT.  At lunch today pt ate 3-4 bites of meat and 100% of Magic Cup (290 kcal, 9 gr protein). Fed by staff to maximize intake. Based on reported intake pt has only consumed approximately <500 kcal today and only 9 gr protein. Will start a calorie count this evening to more fully assess actual intake.   Current wt 180 lb reflects a significant gain of 10 lb since admission. Admit wt 169.5 lb. UBW around 200 lb range before stroke. Physical exam shows mild muscle depletion to clavicles, Moderate loss to deltoids, patellar and quadriceps.  6/10- Patient is out of ICU care now but po intake remains very poor. Over the weekend only 0-10% of  meals and 50 ml fluid. This morning she has taken a few sips of apple juice and a couple of bites protein- no milk, oatmeal or eggs. There are 2 magic cups here on her tray but only 2 bites taken. RD asked nutrition services staff to leave tray in hopes that staff will return and try to feed her.   Her weight is up an additional 8 lbs since 6/4. Her BUN and Sodium are trending up. Palliative medicine is following patient and discussing goals of care.  Currently patient is not eating/drinking enough orally to sustain her necessary nutrition.   Intake/Output Summary (Last 24 hours) at 10/24/2017 0935 Last data filed at 10/24/2017 0631 Gross per 24 hour  Intake 1250 ml  Output 300 ml  Net 950 ml   Labs: Albumin 1.7 (low).  BMP Latest Ref Rng & Units 10/24/2017 10/22/2017 10/21/2017  Glucose 65 - 99 mg/dL 116(H) 202(H) 216(H)  BUN 6 - 20 mg/dL 41(H) 38(H) 31(H)  Creatinine 0.44 - 1.00 mg/dL 0.57 0.60 0.66  BUN/Creat Ratio 6 - 22 (calc) - - -  Sodium 135 - 145 mmol/L 152(H) 147(H) 148(H)  Potassium 3.5 - 5.1 mmol/L 3.7 3.4(L) 3.4(L)  Chloride 101 - 111 mmol/L 124(H) 119(H) 118(H)  CO2 22 - 32 mmol/L 24 23 25   Calcium 8.9 - 10.3 mg/dL 7.5(L) 7.7(L) 7.8(L)     Diet Order:   Diet Order  DIET - DYS 1 Room service appropriate? Yes; Fluid consistency: Thin  Diet effective now         EDUCATION NEEDS:  No education needs have been identified at this time  Skin:  Stage II to right ankle and MASD   Last BM: 6/8 diarrhea   Height:   Ht Readings from Last 1 Encounters:  10/16/17 5\' 3"  (1.6 m)    Weight:   Wt Readings from Last 1 Encounters:  10/20/17 188 lb 15 oz (85.7 kg)    Ideal Body Weight:     BMI:  Body mass index is 33.47 kg/m.  Estimated Nutritional Needs:   Kcal:  1540-1920 kcal (20-25 kcal/kg BW)  Protein:  77-92 g (1-1.2 g/kg BW)  Fluid:  >/=1920 mL (>/= 25 mL/kg BW)   Colman Cater MS,RD,CSG,LDN Office: [phone] Pager: [phone]

## 2017-10-24 NOTE — Progress Notes (Signed)
PROGRESS NOTE    Sandra [name]  FYB:[id number] DOB: [birth year] DOA: 10/12/2017 PCP: Caren Macadam, MD    Brief Narrative:  77 year old female admitted to the hospital with abdominal pain. She was found to have colitis with possible fecal impaction on admission. 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 Neurology following. She also has worsening renal function which is now improving and being followed by Nephrology.  Overall renal function has improved.  Mental status is unimproved and p.o. intake continues to remain poor.  Palliative care assisting in discussions with family members and patient is now DNR.   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 Colitis.  Improved with Flagyl and Cipro DC after 7 day course of treatment on 6/6. Now with recurrence of diarrhea for which we will recheck GI panel.  2. Acute renal failure-improved.  She did receive some IV contrast on admission.  Nephrology has recommended some IV fluid with half-normal saline.  We will continue to monitor with repeat BMP. Start D5W today with hypernatremia. 3. Vtach. Transient 4-beat run noted on tele. Continue to monitor on tele and order 2D echo with no prior LVEF reduction noted. Metoprolol started for rate control. 4. Partial seizures.  Resolved after Keppra. 5. Acute encephalopathy, likely metabolic.  Overall prognosis is dismal-pt now DNR.  ABG and ammonia  were unremarkable.  Possibly related to dehydration/urinary tract infection.  At baseline she has aphasia which makes communication difficult. Based on notes, she appears to be more interactive with other staff members 6. Diabetes.  Noted hypoglycemia for which I have discontinued long-acting insulin and mealtime insulin.  Continue sliding scale coverage. 7. Hypotension. Still having episodes of soft blood pressures.  Cortisol normal. 8. Previous stroke.  She has residual right-sided hemiparesis and global aphasia.  Continue on Plavix.  MRI performed during this hospitalization was poor quality. 9. Possible fecal impaction.  Resolved with enemas and digital disimpaction.  10. UTI. Urinalysis indicates pyuria.  Urine culture showed no growth.  Culture was obtained while patient was already on ciprofloxacin. 11. Hypokalemia-mild.  Repeat labs pending. 12. Severe protein calorie malnutrition.  P.o. intake remains poor.  Nutrition following with improvement in intake noted. Will continue to follow over the weekend. 13. Depression. Increased Celexa to 20mg .   DVT prophylaxis: Lovenox Code Status: DNR Family Communication: None currently at bedside Disposition Plan: Continue to monitor oral intake and diarrhea evaluation. Family discussions ongoing for disposition.   Consultants:   Neurology  Nephrology  Procedures:  EEG:1. Frontal intermittent rhythmic delta activities are observed. This is typically seen in toxic metabolic encephalopathies.  2. Triphasic waves are also observed typically seen in metabolic encephalopathies particularly due to hepatic failure or renal failure.  Antimicrobials:   Ciprofloxacin 5/30 >6/6  Flagyl 5/30>6/6   Subjective: Patient seen and evaluated this morning.  She is starting to have diarrhea this morning.  No further V.  tach episodes noted on telemetry with stable hemodynamics.  She is starting to become hypernatremic and is still continuing to have poor  oral intake.  Family members are now agreeable to DNR.  Objective: Vitals:   10/23/17 2129 10/23/17 2200 10/23/17 2359 10/24/17 0459  BP: (!) 117/55  137/71 140/63  Pulse: 86  98 80  Resp: 16   18  Temp: 98.1 F (36.7 C)     TempSrc: Oral     SpO2: (!) 87% 92% 93% (!) 86%  Weight:      Height:        Intake/Output Summary (Last 24 hours) at 10/24/2017 1451 Last data filed at 10/24/2017 0631 Gross per 24 hour  Intake 1250 ml  Output 300 ml  Net 950 ml   Filed Weights   10/17/17 0500 10/18/17 0400 10/20/17 0500  Weight: 80.6 kg (177 lb 11.1 oz) 81.7 kg (180 lb 1.9 oz) 85.7 kg (188 lb 15 oz)    Examination:  General exam: Alert, awake, nonverbal Respiratory system: Clear to auscultation. Respiratory effort normal. Cardiovascular system:RRR. No murmurs, rubs, gallops. Gastrointestinal system: Abdomen is nondistended, soft and nontender. No organomegaly or masses felt. Normal bowel sounds heard. Central nervous system: unable to assess due to mental status. Extremities: developing anasarca Skin: No rashes, lesions or ulcers Psychiatry: nonverbal    Data Reviewed: I have personally reviewed following labs and imaging studies  CBC: Recent Labs  Lab 10/20/17 0539 10/21/17 0618 10/24/17 0414  WBC 14.1* 13.6* 7.2  HGB 10.2* 11.2* 9.3*  HCT 32.0* 34.8* 28.2*  MCV 82.1 81.1 81.3  PLT 337 459* 425*   Basic Metabolic Panel: Recent Labs  Lab 10/18/17 0425 10/19/17 0355 10/20/17 0539 10/21/17 0618 10/22/17 0805 10/24/17 0414  NA 144 145 147* 148* 147* 152*  K 4.0 3.6 3.5 3.4* 3.4* 3.7  CL 116* 117* 119* 118* 119* 124*  CO2 24 26 25 25 23 24   GLUCOSE 91 163* 216* 216* 202* 116*  BUN 48* 43* 34* 31* 38* 41*  CREATININE 1.10* 0.88 0.71 0.66 0.60 0.57  CALCIUM 7.2* 7.3* 7.5* 7.8* 7.7* 7.5*  MG  --   --   --   --  1.9  --   PHOS 2.0* 2.0* 2.0*  --   --   --    GFR: Estimated Creatinine Clearance: 60.1 mL/min (by C-G formula based on SCr of 0.57 mg/dL). Liver  Function Tests: Recent Labs  Lab 10/18/17 0425 10/19/17 0355 10/20/17 0539  ALBUMIN 1.7* 1.6* 1.7*   No results for input(s): LIPASE, AMYLASE in the last 168 hours. No results for input(s): AMMONIA in the last 168 hours. Coagulation Profile: No results for input(s): INR, PROTIME in the last 168 hours. Cardiac Enzymes: No results for input(s): CKTOTAL, CKMB, CKMBINDEX, TROPONINI in the last 168 hours. BNP (last 3 results) No results for input(s): PROBNP in the last 8760 hours. HbA1C: No results for input(s): HGBA1C in the last 72 hours. CBG: Recent Labs  Lab 10/23/17 1612 10/23/17 2137 10/24/17 0459 10/24/17 0751 10/24/17 1140  GLUCAP 92 86 98 87 140*   Lipid Profile: No results for input(s): CHOL, HDL, LDLCALC, TRIG, CHOLHDL, LDLDIRECT in the last 72 hours. Thyroid Function Tests: No results for input(s): TSH, T4TOTAL, FREET4, T3FREE, THYROIDAB in the last 72 hours. Anemia Panel: No results for input(s): VITAMINB12, FOLATE, FERRITIN, TIBC, IRON, RETICCTPCT in the last 72 hours. Sepsis Labs: No results for input(s): PROCALCITON, LATICACIDVEN in the last 168 hours.  Recent Results (from the  past 240 hour(s))  Culture, Urine     Status: None   Collection Time: 10/15/17 10:00 AM  Result Value Ref Range Status   Specimen Description   Final    URINE, CLEAN CATCH Performed at [facility], [address] [place], [place] [address]    Special Requests   Final    NONE Performed at [facility], [address] [place], [place] [address]    Culture   Final    NO GROWTH Performed at [facility], [place] [address] [place], [place] [address]    Report Status 10/16/2017 FINAL  Final         Radiology Studies: No results found.      Scheduled Meds: . atorvastatin  40 mg Oral q1800  . bethanechol  25 mg Oral TID  . citalopram  20 mg Oral Daily  . clopidogrel  75 mg Oral Q breakfast  . enoxaparin (LOVENOX) injection  40 mg Subcutaneous Q24H  . feeding  supplement (PRO-STAT SUGAR FREE 64)  30 mL Oral TID BM  . insulin aspart  0-15 Units Subcutaneous TID WC  . insulin aspart  0-5 Units Subcutaneous QHS  . levETIRAcetam  500 mg Oral BID  . mouth rinse  15 mL Mouth Rinse BID  . metoprolol tartrate  25 mg Oral BID  . senna-docusate  2 tablet Oral QHS  . tamsulosin  0.4 mg Oral QPC supper   Continuous Infusions: . sodium chloride Stopped (10/24/17 0817)  . dextrose 5 % with KCl 20 mEq / L 20 mEq (10/24/17 0814)     LOS: 11 days    Time spent: 30 minutes    Koty Anctil Darleen Crocker, DO [facility] Pager [phone]  If 7PM-7AM, please contact night-coverage [email or web address] Password [email or web address] 10/24/2017, 2:51 PM

## 2017-10-25 ENCOUNTER — Inpatient Hospital Stay (HOSPITAL_COMMUNITY): Payer: Medicare Other

## 2017-10-25 ENCOUNTER — Encounter (HOSPITAL_COMMUNITY): Payer: Self-pay | Admitting: Gastroenterology

## 2017-10-25 DIAGNOSIS — K529 Noninfective gastroenteritis and colitis, unspecified: Principal | ICD-10-CM

## 2017-10-25 DIAGNOSIS — I361 Nonrheumatic tricuspid (valve) insufficiency: Secondary | ICD-10-CM

## 2017-10-25 LAB — GASTROINTESTINAL PANEL BY PCR, STOOL (REPLACES STOOL CULTURE)
ASTROVIRUS: NOT DETECTED
Adenovirus F40/41: NOT DETECTED
CYCLOSPORA CAYETANENSIS: NOT DETECTED
Campylobacter species: NOT DETECTED
Cryptosporidium: NOT DETECTED
Entamoeba histolytica: NOT DETECTED
Enteroaggregative E coli (EAEC): NOT DETECTED
Enteropathogenic E coli (EPEC): NOT DETECTED
Enterotoxigenic E coli (ETEC): NOT DETECTED
Giardia lamblia: NOT DETECTED
Norovirus GI/GII: NOT DETECTED
Plesimonas shigelloides: NOT DETECTED
Rotavirus A: NOT DETECTED
SALMONELLA SPECIES: NOT DETECTED
SAPOVIRUS (I, II, IV, AND V): NOT DETECTED
SHIGA LIKE TOXIN PRODUCING E COLI (STEC): NOT DETECTED
SHIGELLA/ENTEROINVASIVE E COLI (EIEC): NOT DETECTED
VIBRIO CHOLERAE: NOT DETECTED
VIBRIO SPECIES: NOT DETECTED
Yersinia enterocolitica: NOT DETECTED

## 2017-10-25 LAB — GLUCOSE, CAPILLARY
GLUCOSE-CAPILLARY: 109 mg/dL — AB (ref 65–99)
GLUCOSE-CAPILLARY: 178 mg/dL — AB (ref 65–99)
GLUCOSE-CAPILLARY: 278 mg/dL — AB (ref 65–99)
Glucose-Capillary: 199 mg/dL — ABNORMAL HIGH (ref 65–99)

## 2017-10-25 LAB — CBC
HCT: 31 % — ABNORMAL LOW (ref 36.0–46.0)
Hemoglobin: 10.1 g/dL — ABNORMAL LOW (ref 12.0–15.0)
MCH: 26.4 pg (ref 26.0–34.0)
MCHC: 32.6 g/dL (ref 30.0–36.0)
MCV: 80.9 fL (ref 78.0–100.0)
PLATELETS: 613 10*3/uL — AB (ref 150–400)
RBC: 3.83 MIL/uL — AB (ref 3.87–5.11)
RDW: 17.8 % — ABNORMAL HIGH (ref 11.5–15.5)
WBC: 5.6 10*3/uL (ref 4.0–10.5)

## 2017-10-25 LAB — ECHOCARDIOGRAM COMPLETE
Height: 63 in
WEIGHTICAEL: 3022.95 [oz_av]

## 2017-10-25 LAB — BASIC METABOLIC PANEL
ANION GAP: 5 (ref 5–15)
BUN: 41 mg/dL — ABNORMAL HIGH (ref 6–20)
CO2: 24 mmol/L (ref 22–32)
Calcium: 7.5 mg/dL — ABNORMAL LOW (ref 8.9–10.3)
Chloride: 121 mmol/L — ABNORMAL HIGH (ref 101–111)
Creatinine, Ser: 0.63 mg/dL (ref 0.44–1.00)
Glucose, Bld: 137 mg/dL — ABNORMAL HIGH (ref 65–99)
POTASSIUM: 3.9 mmol/L (ref 3.5–5.1)
SODIUM: 150 mmol/L — AB (ref 135–145)

## 2017-10-25 LAB — MAGNESIUM: MAGNESIUM: 1.9 mg/dL (ref 1.7–2.4)

## 2017-10-25 MED ORDER — PRO-STAT SUGAR FREE PO LIQD
30.0000 mL | Freq: Two times a day (BID) | ORAL | Status: DC
  Administered 2017-10-25 – 2017-10-26 (×2): 30 mL
  Filled 2017-10-25 (×2): qty 30

## 2017-10-25 MED ORDER — OSMOLITE 1.5 CAL PO LIQD
1000.0000 mL | ORAL | Status: DC
  Filled 2017-10-25: qty 1000

## 2017-10-25 MED ORDER — OSMOLITE 1.5 CAL PO LIQD
1000.0000 mL | ORAL | Status: DC
  Filled 2017-10-25 (×2): qty 1000

## 2017-10-25 MED ORDER — ADULT MULTIVITAMIN LIQUID CH
15.0000 mL | Freq: Every day | ORAL | Status: DC
  Administered 2017-10-26 – 2017-10-29 (×3): 15 mL via ORAL
  Filled 2017-10-25 (×7): qty 15

## 2017-10-25 NOTE — Progress Notes (Signed)
Nutrition Follow-up   INTERVENTION:  When tube is placed begin: Osmolite 1.5 @ 20 ml/hr and change Prostat 30 ml BID via NGT (Provides 920 kcal, 60 gr protein and 366 ml fluid).   Multivitamin daily  Recommend: supplemental thiamin (50-100 mg if provided IV) - For at least 3 days  IV fluids 0.45 NS @ 50 ml/hr (1200 ml daily)  NUTRITION DIAGNOSIS:  Severe acute malnutrition related to colitis, AKI, obstipation as evidenced by documented and reported poor food and fluid acceptance-oral intake 0-25% of meals >5 days. Mild muscle loss clavicles, temples and legs.   GOAL:   Patient will meet greater than or equal to 90% of their needs; if feasible given pt loss of desire to eat or drink   MONITOR:   PO intake, Supplement acceptance, Weight trends  REASON FOR ASSESSMENT:   Poor po intake   ASSESSMENT:   77 y/o female with Hx of constipation, DM2, GI bleed, high cholesterol, HTN, rectal bleeding, and stroke with global aphasia.  Pt is nonverbal.  Admitted for abdominal pain and vomiting.  Low Braden of 12. Fecal impaction on admission. Food intake fair and poor fluid intake at SNF 2 weeks leading up to hospital admission.  Minimal po intake 0-10% of meals since admission on 5/29. Discussed prior intake with son. Patient on a Mechanical diet at facility. Patient has poor acceptance of the Ensure Enlive.  Talked with nurse and NT.  At lunch today pt ate 3-4 bites of meat and 100% of Magic Cup (290 kcal, 9 gr protein). Fed by staff to maximize intake. Based on reported intake pt has only consumed approximately <500 kcal today and only 9 gr protein. Will start a calorie count this evening to more fully assess actual intake.   Current wt 180 lb reflects a significant gain of 10 lb since admission. Admit wt 169.5 lb. UBW around 200 lb range before stroke. Physical exam shows mild muscle depletion to clavicles, Moderate loss to deltoids, patellar and quadriceps.  6/10- Patient is out of ICU  care now but po intake remains very poor. Over the weekend only 0-10% of meals and 50 ml fluid. This morning she has taken a few sips of apple juice and a couple of bites protein- no milk, oatmeal or eggs. There are 2 magic cups here on her tray but only 2 bites taken. RD asked nutrition services staff to leave tray in hopes that staff will return and try to feed her.   Her weight is up an additional 8 lbs since 6/4. Her BUN and Sodium are trending up. Palliative medicine is following patient and discussing goals of care.  Currently patient is not eating/drinking enough orally to sustain her necessary nutrition.   6/11-GI consulted and discussed option of PEG tube.  Patient is having an NGT placed for supplemental nutrition. RD has been consulted to start tube feeds. Talked with her nurse who reports patient will be going down to radiology for tube placement and she is not sure when that will be.  Patient lunch tray is here. She has eaten 100% of [employer]- chocolate pudding  (130 Kcal, 3 gr protein), (100% of magic cup - provides 290 kcal, 9 gr protein) and a few bites of mashed potatoes. It doesn't look as if she drank any fluid.   Intake/Output Summary (Last 24 hours) at 10/25/2017 1539 Last data filed at 10/25/2017 1300 Gross per 24 hour  Intake 0 ml  Output -  Net 0 ml  Labs: Albumin 1.7 (low).  BMP Latest Ref Rng & Units 10/25/2017 10/24/2017 10/22/2017  Glucose 65 - 99 mg/dL 137(H) 116(H) 202(H)  BUN 6 - 20 mg/dL 41(H) 41(H) 38(H)  Creatinine 0.44 - 1.00 mg/dL 0.63 0.57 0.60  BUN/Creat Ratio 6 - 22 (calc) - - -  Sodium 135 - 145 mmol/L 150(H) 152(H) 147(H)  Potassium 3.5 - 5.1 mmol/L 3.9 3.7 3.4(L)  Chloride 101 - 111 mmol/L 121(H) 124(H) 119(H)  CO2 22 - 32 mmol/L 24 24 23   Calcium 8.9 - 10.3 mg/dL 7.5(L) 7.5(L) 7.7(L)     Diet Order:   Diet Order           DIET - DYS 1 Room service appropriate? Yes; Fluid consistency: Thin  Diet effective now         EDUCATION NEEDS:  No  education needs have been identified at this time  Skin:  Stage II to right ankle and MASD   Last BM:   Height:   Ht Readings from Last 1 Encounters:  10/16/17 5\' 3"  (1.6 m)    Weight:   Wt Readings from Last 1 Encounters:  10/20/17 188 lb 15 oz (85.7 kg)    Ideal Body Weight:     BMI:  Body mass index is 33.47 kg/m.  Estimated Nutritional Needs:   Kcal:  1540-1920 kcal (20-25 kcal/kg BW)  Protein:  77-92 g (1-1.2 g/kg BW)  Fluid:  >/=1920 mL (>/= 25 mL/kg BW)   Colman Cater MS,RD,CSG,LDN Office: [phone] Pager: [phone]

## 2017-10-25 NOTE — Progress Notes (Signed)
  Echocardiogram 2D Echocardiogram has been performed.  Sandra [name] M 10/25/2017, 12:01 PM

## 2017-10-25 NOTE — Progress Notes (Signed)
Palliative: Mrs. Kahler is resting quietly in bed.  She will make but not keep eye contact.  She will not answer my questions. There is no family at bedside.  Meeting with son Sandra [name].  We talk about [name] poor nutritional status, evaluation by GI and recommendation against PEG tube.  Sandra [name] shares that, at this point, he would like a trial of NG tube feeding.  He calls this a "[facility]" effort.  We talked about time based trials.  I ask if he had considered what length of time.  I encouraged him to consider 3 days to 5 days.  Also, I encourage Sandra [name] to be at bedside when nursing staff inserts the tube.  I share that Mrs. Abramo, to an extent, must allow [place] to place the tube.  We talked about risks including partially removing the tube and aspirating.  I encourage Sandra [name] to consider what is next if trial tube feed does not change anything for [name]. We talked about albumin of 1.7.  We talked about returning to residential SNF with hospice versus residential hospice at [place].  We talked about the concept of "let nature take its course".  I give an example of this, talking about comfort and dignity, continuing to care for the person.  Sandra [name] states his preference would be residential hospice over residential SNF.  Sandra [name] and I also briefly talked about [place].  We also briefly talked about funeral arrangements/costs. Conference with Elizabeth Palau and Dr. Manuella Ghazi related to family meeting and desire for temporary feeding tube. 76 minutes Quinn Axe, NP Palliative Medicine Team Team Phone # [phone]

## 2017-10-25 NOTE — Progress Notes (Signed)
Physical Therapy Treatment Patient Details Name: Sandra [name] MRN: [id number] DOB: 07-03-1940 Today's Date: 10/25/2017    History of Present Illness Sandra [name]  is a 77 y.o. female,with a history of prior CVA with global aphasia, diabetes mellitus, hypertension, peripheral artery disease was sent from skilled care facility for evaluation of vomiting.  There was concern for small bowel obstruction.  In the ED CT scan of the abdomen and pelvis showed sigmoid colitis. Patient has global aphasia, nonverbal, unable to provide any history    PT Comments    Pt supine in bed and willing to participate, pt nonverbal but gave facial expressions of understanding and a nood to assure agreement for therapy.  Pt with bowel movement, nurse tech aware and cleansed prior bed mobility training.  Max A x 2 for bed mobility supine -->sidelying to sit.  Upon sitting pt with mod A to prevent posterior lateral lean to Rt, verbal and tactile cueing to improve awareness of lean.  EOS pt left in bed with pillows adjusted for support of comfort as well prevention of pressure sores.  No reports of pain through session.     Follow Up Recommendations  SNF     Equipment Recommendations  None recommended by PT    Recommendations for Other Services       Precautions / Restrictions Precautions Precautions: Fall Restrictions Weight Bearing Restrictions: No    Mobility  Bed Mobility Overal bed mobility: Needs Assistance Bed Mobility: Supine to Sit;Sit to Supine     Supine to sit: Max assist;+2 for physical assistance;HOB elevated Sit to supine: Max assist;+2 for physical assistance      Transfers                 General transfer comment: n/a - did not attempt due to deficits in bed mobility and sitting balance  Ambulation/Gait                 Stairs             Wheelchair Mobility    Modified Rankin (Stroke Patients Only)       Balance Overall balance assessment:  Needs assistance Sitting-balance support: Feet supported;Bilateral upper extremity supported Sitting balance-Leahy Scale: Poor Sitting balance - Comments: sitting EOB - max verbal and tactile cues to sit up tall and decrease posterolateral lean; mod A to maintain upright sitting posture Postural control: Posterior lean;Right lateral lean                                  Cognition Arousal/Alertness: Lethargic Behavior During Therapy: Flat affect Overall Cognitive Status: History of cognitive impairments - at baseline                                 General Comments: global aphasia since CVA in January 2019      Exercises      General Comments        Pertinent Vitals/Pain Pain Assessment: Faces Faces Pain Scale: Hurts a little bit Pain Location: minimal facial expressions while rolling to Rt side Pain Descriptors / Indicators: Grimacing;Moaning Pain Intervention(s): Monitored during session;Repositioned;Limited activity within patient's tolerance    Home Living                      Prior Function  PT Goals (current goals can now be found in the care plan section)      Frequency    Min 4X/week      PT Plan      Co-evaluation              AM-PAC PT "6 Clicks" Daily Activity  Outcome Measure  Difficulty turning over in bed (including adjusting bedclothes, sheets and blankets)?: Unable Difficulty moving from lying on back to sitting on the side of the bed? : Unable Difficulty sitting down on and standing up from a chair with arms (e.g., wheelchair, bedside commode, etc,.)?: Unable Help needed moving to and from a bed to chair (including a wheelchair)?: Total Help needed walking in hospital room?: Total Help needed climbing 3-5 steps with a railing? : Total 6 Click Score: 6    End of Session Equipment Utilized During Treatment: Oxygen Activity Tolerance: Patient tolerated treatment well;Patient limited by  lethargy Patient left: in bed;with call bell/phone within reach;with bed alarm set Nurse Communication: Mobility status;Need for lift equipment PT Visit Diagnosis: Muscle weakness (generalized) (M62.81);Hemiplegia and hemiparesis Hemiplegia - Right/Left: Right Hemiplegia - dominant/non-dominant: Dominant Hemiplegia - caused by: Cerebral infarction     Time: 6387-5643 PT Time Calculation (min) (ACUTE ONLY): 27 min  Charges:  $Therapeutic Activity: 23-37 mins                    G Codes:       Ihor Austin, LPTA; CBIS [phone]  Aldona Lento 10/25/2017, 1:25 PM

## 2017-10-25 NOTE — Consult Note (Signed)
Referring Provider: Dr. Manuella Ghazi  Primary Care Physician:  Caren Macadam, MD Primary Gastroenterologist:  Dr. Oneida Alar   Date of Admission: 10/13/17 Date of Consultation: 10/25/17  Reason for Consultation:  Consideration for PEG Tube   HPI:  Sandra [name] is a 77 y.o. year old female admitted with abdominal pain, fecal impaction, CT findings of colitis involving sigmoid colon. History of stroke (jan 2019) on Plavix, neurology following during admission for seizures. Nephrology following due to acute renal failure, which is improving. Palliative care following to assist with decision-making due to decline in status in setting of acute illness. Oral intake has remained poor. Speech has evaluated and recommended D1/puree and thin liquids. Colitis improved with course of Cipro and Flagyl; however, she has had recurrent diarrhea, and GI pathogen panel is pending. She was made DNR during this hospitalization. GI consulted to discuss risks and benefits of PEG tube placement as family is exploring options.    Senna tablets held for several days then given yesterday evening. Now with loose stool. GI pathogen panel is pending. She had a large, loose brown BM in the bed at time of consultation but no rectal bleeding. She is not able to answer questions, but she does turn her head towards me when I walk in the room. She maintains eye contact briefly but then turns her head away. [name], her son (who is also a physical therapist here at the hospital), is present during time of consultation. She is unable to tell me if she is in pain or any prior wishes. [name] is wondering if adding supplemental nutrition via tube feedings may help to alter her course or give her extra reserves to overcome this acute illness. He also states that he does not want to cause any harm or discomfort, and he understands that a PEG tube is not curative. He also raises concerns about long-term care with PEG tube and possibly needing to make  difficult decisions regarding feeding in the future with further decline. He tells me that his extended family is coming into town this weekend and hopes that his mother will rally. He asks about possibly NG tube placement for short-term purposes to see how she would respond. We discussed discomfort with NG tube placement, potential dislodgement, and possible inability to place due to cooperation. We also discussed that she has many different health issues that are accumulating and resulting in her health decline and contributing to the burden of frailty.   Patient appears uncomfortable in the bed. She has residual right-sided weakness but is moving her left upper extremity across her abdomen while in the bed. Hands are in the mitt restraint, but she can freely move this extremity.    Past Medical History:  Diagnosis Date  . Colon polyp   . Constipation   . Diabetes mellitus without complication ([place])   . GI bleed   . Hemorrhoids   . High cholesterol   . Hypertension   . PAD (peripheral artery disease) ([place])   . Peripheral neuropathy   . Rectal bleeding    "for 7 years"  . Rectal bleeding   . Stroke [facility])     Past Surgical History:  Procedure Laterality Date  . ABDOMINAL HYSTERECTOMY    . COLONOSCOPY     X 7  . COLONOSCOPY N/A 03/21/2017   Dr. Oneida Alar: 4 mm mid ascending colon tubular adenoma, significantly redundant, multiple small and large-mouthed diverticula in recto-sigmoid, sigmoid, and descending colon. internal hemorrhoids banded X 3.  Prior to Admission medications   Medication Sig Start Date End Date Taking? Authorizing Provider  amLODipine (NORVASC) 10 MG tablet Take 1 tablet (10 mg total) by mouth daily. 07/14/17  Yes Love, Ivan Anchors, PA-C  aspirin 325 MG EC tablet Take 1 tablet (325 mg total) by mouth daily. 07/14/17  Yes Love, Ivan Anchors, PA-C  atorvastatin (LIPITOR) 40 MG tablet Take 1 tablet (40 mg total) by mouth daily at 6 PM. 06/20/17  Yes Johnson, Clanford L, MD   bethanechol (URECHOLINE) 25 MG tablet Take 1 tablet (25 mg total) by mouth 3 (three) times daily. 07/14/17  Yes Love, Ivan Anchors, PA-C  bisacodyl (DULCOLAX) 10 MG suppository Place 10 mg rectally once as needed for moderate constipation.   Yes [provider]  citalopram (CELEXA) 10 MG tablet Take 10 mg by mouth daily.   Yes [provider]  clopidogrel (PLAVIX) 75 MG tablet Take 1 tablet (75 mg total) by mouth daily with breakfast. 06/21/17  Yes Johnson, Clanford L, MD  collagenase (SANTYL) ointment Apply 1 application topically daily.   Yes [provider]  gabapentin (NEURONTIN) 100 MG capsule Take 100 mg by mouth 3 (three) times daily.   Yes [provider]  insulin glargine (LANTUS) 100 UNIT/ML injection Inject 0.35 mLs (35 Units total) into the skin daily. Patient taking differently: Inject 10 Units into the skin daily.  07/14/17  Yes Love, Ivan Anchors, PA-C  metFORMIN (GLUCOPHAGE) 500 MG tablet Take by mouth 3 (three) times daily.   Yes [provider]  Multiple Vitamins-Minerals (ONE-A-DAY 50 PLUS PO) Take 1 tablet by mouth daily.   Yes [provider]  senna-docusate (SENOKOT-S) 8.6-50 MG tablet Take 2 tablets by mouth at bedtime. Patient taking differently: Take 2 tablets by mouth daily.  07/14/17  Yes Love, Ivan Anchors, PA-C  tamsulosin (FLOMAX) 0.4 MG CAPS capsule Take 1 capsule (0.4 mg total) by mouth daily after supper. 07/14/17  Yes Love, Ivan Anchors, PA-C  traMADol (ULTRAM) 50 MG tablet Take 50 mg by mouth every 6 (six) hours as needed.   Yes [provider]  vitamin B-12 (CYANOCOBALAMIN) 1000 MCG tablet Take 1,000 mcg by mouth daily.   Yes [provider]  acetaminophen (TYLENOL) 325 MG tablet Take 1-2 tablets (325-650 mg total) by mouth every 4 (four) hours as needed for mild pain. 07/14/17   Bary Leriche, PA-C    Current Facility-Administered Medications  Medication Dose Route Frequency Provider Last Rate Last Dose  .  0.45 % sodium chloride infusion   Intravenous Continuous Fran Lowes, MD 50 mL/hr at 10/25/17 0838    . acetaminophen (TYLENOL) tablet 325-650 mg  325-650 mg Oral Q4H PRN Oswald Hillock, MD   650 mg at 10/22/17 1708  . atorvastatin (LIPITOR) tablet 40 mg  40 mg Oral q1800 Oswald Hillock, MD   40 mg at 10/24/17 1849  . bethanechol (URECHOLINE) tablet 25 mg  25 mg Oral TID Oswald Hillock, MD   25 mg at 10/25/17 5093  . citalopram (CELEXA) tablet 20 mg  20 mg Oral Daily Manuella Ghazi, Pratik D, DO   20 mg at 10/25/17 0837  . clopidogrel (PLAVIX) tablet 75 mg  75 mg Oral Q breakfast Oswald Hillock, MD   75 mg at 10/25/17 0837  . enoxaparin (LOVENOX) injection 40 mg  40 mg Subcutaneous Q24H Kathie Dike, MD   40 mg at 10/24/17 1848  . feeding supplement (PRO-STAT SUGAR FREE 64) liquid 30 mL  30  mL Oral TID BM Manuella Ghazi, Pratik D, DO   30 mL at 10/25/17 0998  . insulin aspart (novoLOG) injection 0-15 Units  0-15 Units Subcutaneous TID WC Manuella Ghazi, Pratik D, DO   2 Units at 10/24/17 1849  . insulin aspart (novoLOG) injection 0-5 Units  0-5 Units Subcutaneous QHS Manuella Ghazi, Pratik D, DO      . levETIRAcetam (KEPPRA) 100 MG/ML solution 500 mg  500 mg Oral BID Manuella Ghazi, Pratik D, DO   500 mg at 10/25/17 0837  . MEDLINE mouth rinse  15 mL Mouth Rinse BID Kathie Dike, MD   15 mL at 10/25/17 0837  . metoprolol tartrate (LOPRESSOR) tablet 25 mg  25 mg Oral BID Heath Lark D, DO   25 mg at 10/25/17 0837  . ondansetron (ZOFRAN) tablet 4 mg  4 mg Oral Q6H PRN Oswald Hillock, MD   4 mg at 10/18/17 2103   Or  . ondansetron (ZOFRAN) injection 4 mg  4 mg Intravenous Q6H PRN Oswald Hillock, MD   4 mg at 10/23/17 1806  . senna-docusate (Senokot-S) tablet 2 tablet  2 tablet Oral QHS Oswald Hillock, MD   2 tablet at 10/24/17 2315  . tamsulosin (FLOMAX) capsule 0.4 mg  0.4 mg Oral QPC supper Oswald Hillock, MD   0.4 mg at 10/24/17 1849    Allergies as of 10/12/2017 - Review Complete 10/12/2017  Allergen Reaction Noted  . Hctz  [hydrochlorothiazide] Other (See Comments) 11/29/2016  . Morphine and related Other (See Comments) 06/16/2016  . Valium [diazepam] Other (See Comments) 05/29/2016    Family History  Problem Relation Age of Onset  . Diabetes Mother   . Cancer Father   . Diabetes Sister   . Diabetes Brother   . Diabetes Daughter   . Diabetes Maternal Grandmother   . Diabetes Daughter   . Colon cancer Neg Hx     Social History   Socioeconomic History  . Marital status: Widowed    Spouse name: Not on file  . Number of children: Not on file  . Years of education: Not on file  . Highest education level: Not on file  Occupational History  . Not on file  Social Needs  . Financial resource strain: Not on file  . Food insecurity:    Worry: Not on file    Inability: Not on file  . Transportation needs:    Medical: Not on file    Non-medical: Not on file  Tobacco Use  . Smoking status: Never Smoker  . Smokeless tobacco: Never Used  Substance and Sexual Activity  . Alcohol use: No  . Drug use: No  . Sexual activity: Never  Lifestyle  . Physical activity:    Days per week: Not on file    Minutes per session: Not on file  . Stress: Not on file  Relationships  . Social connections:    Talks on phone: Not on file    Gets together: Not on file    Attends religious service: Not on file    Active member of club or organization: Not on file    Attends meetings of clubs or organizations: Not on file    Relationship status: Not on file  . Intimate partner violence:    Fear of current or ex partner: Not on file    Emotionally abused: Not on file    Physically abused: Not on file    Forced sexual activity: Not on file  Other Topics Concern  .  Not on file  Social History Narrative  . Not on file    Review of Systems: Unable to obtain due to cognitive status   Physical Exam: Vital signs in last 24 hours: Temp:  [97.5 F (36.4 C)-98.6 F (37 C)] 97.5 F (36.4 C) (06/11 0643) Pulse Rate:   [82-95] 95 (06/11 0643) Resp:  [18-20] 18 (06/11 0643) BP: (109-141)/(49-57) 141/56 (06/11 0643) SpO2:  [93 %-96 %] 96 % (06/11 0643) Last BM Date: 10/24/17 General:   Acute, chronically ill appearing. Appears uncomfortable. Unable to assess orientation. Turns head towards me in the room but does not keep gaze.  Head:  Normocephalic and atraumatic. Eyes:  Sclera clear, no icterus.   Conjunctiva pink. Mouth:  No deformity or lesions Lungs:  Clear throughout to auscultation.    Heart:  S1 S2 present without murmurs  Abdomen:  Soft, obese, and nondistended. Tender to deep palpation LLQ, No masses, hepatosplenomegaly or hernias noted.  Msk:  Right-sided weakness, freely moves left upper extremity. Left hand in restraint mitt  Extremities:  Without edema. Neurologic:  Unable to assess orientation    Intake/Output from previous day: 06/10 0701 - 06/11 0700 In: 770.4 [P.O.:10; I.V.:760.4] Out: -  Intake/Output this shift: No intake/output data recorded.  Lab Results: Recent Labs    10/24/17 0414 10/25/17 0719  WBC 7.2 5.6  HGB 9.3* 10.1*  HCT 28.2* 31.0*  PLT 580* 613*   BMET Recent Labs    10/24/17 0414 10/25/17 0719  NA 152* 150*  K 3.7 3.9  CL 124* 121*  CO2 24 24  GLUCOSE 116* 137*  BUN 41* 41*  CREATININE 0.57 0.63  CALCIUM 7.5* 7.5*   10/20/17 Albumin 1.7.    Impression: 77 year old female with multiple medical issues, s/p stroke Jan 2019, admitted with CT scan findings of colitis, acute renal failure, acute on chronic encephalopathy, UTI, severe malnutrition. Her oral intake has been poor on dysphagia 1/puree diet per speech recommendations. Palliative is following due to decline in health status, poor prognosis, and she is now DNR. Family is questioning if increased nutrition via PEG tube or NG tube would be an option in this case, as they are hoping this could help patient overcome acute illness.   Unfortunately, patient has multiple health issues and frail, and  her prognosis is poor. She has a larger AP diameter, so PEG tube placement endoscopically could be difficult. I also discussed with son that she would be at high risk for inadvertently manipulating PEG tube as she is freely moving her left hand and in a mitt. Discussed risks of dislodgment, infection, sedation for placement, bleeding, aspiration, and discomfort to patient. Discussed with him that the risks seem to outweigh the benefits of feeding tube placement. He raised the idea of NG tube placement to see if supplemental nutrition would help. In this clinical scenario, she would ideally be placed on comfort measures and continue to receive oral intake as appropriate from speech recommendations. Unlikely that nutrition supplementation will change her trajectory in the setting of multiple health issues.   Plan: PEG tube placement risks outweigh benefits Agree with Palliative care involvement GI pathogen panel pending: at risk for Cdiff given multiple comorbidities and recent exposure to antibiotics Hold Senna dosing this evening in setting of diarrhea   Annitta Needs, PhD, ANP-BC [facility]     LOS: 12 days    10/25/2017, 10:06 AM

## 2017-10-25 NOTE — Progress Notes (Addendum)
PROGRESS NOTE    Sandra [name]  IRS:[id number] DOB: 1941/03/08 DOA: 10/12/2017 PCP: Caren Macadam, MD    Brief Narrative:  77 year old female admitted to the hospital from skilled nursing facility with abdominal pain. She was found to have colitis with possible fecal impaction on admission.  She has thus far completed a full course of IV ciprofloxacin and Flagyl.  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 Neurology following. She also had worsening renal function which is now improved and was followed by Nephrology at one point.  Mental status remains stable, but oral intake continues to remain poor.  She has developed some worsening diarrhea over the last 2 days for which GI panel is currently pending.  Palliative care assisting in discussions with family members and patient is now DNR.  Consultation has been placed to GI for evaluation for feeding tube.  She continues to remain on some IV fluid due to poor intake and appears to have a poor prognosis overall.   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 Colitis.  Improved with Flagyl and Cipro DC after 7 day course of treatment on 6/6. Now with recurrence of diarrhea for which GI panel is pending. 2. Acute renal failure-resolved; now with hypernatremia.  She did receive some IV contrast on admission.  Nephrology has recommended some IV fluid with half-normal saline but have s/o for now.   We will continue to monitor with repeat BMP. Continue D5W today with hypernatremia that is improved. 3. Vtach. Transient 4-beat run noted on tele. Continue to monitor on tele and 2D echo pending with no prior LVEF reduction noted. Metoprolol started for rate control. 4. Partial seizures-resolved.  Resolved after Keppra. 5. Acute on chronic encephalopathy, likely metabolic.  Overall prognosis is dismal-pt now DNR.  ABG and ammonia were unremarkable.  Possibly related to dehydration/urinary tract infection.  At baseline she has aphasia which makes communication difficult.  6. Diabetes. Continue sliding scale coverage. BG ok while on D5 drip. 7. Previous stroke.  She has residual right-sided hemiparesis and global aphasia.  Continue on Plavix.  MRI performed during this hospitalization was poor quality. 8. Possible fecal impaction.  Resolved with enemas and digital disimpaction.  9. UTI-resolved. Urinalysis indicates pyuria.  Urine culture showed no growth.  Culture was obtained while patient was already on ciprofloxacin. 10. Hypokalemia-mild.  Resolved. Continue potassium supplementation in IVF and monitor due to diarrhea. 11. Severe protein calorie malnutrition.  P.o. intake remains poor.  Nutrition following with improvement in intake noted, but his has worsened again. GI evaluation for PEG tube and to help with decision-making/discussion with family. 12. Depression. Increased Celexa to 20mg .   DVT prophylaxis: Lovenox Code Status: DNR Family Communication: Communications ongoing with her son [name] who is a physical therapist at this hospital. Disposition Plan: Continue to monitor oral intake and diarrhea evaluation. Family discussions ongoing for disposition.  GI consultation obtained  today for evaluation of PEG tube.  Addendum: Patient to get small caliber NG tube placed per family request.  Orders have been placed with nutrition consult.  Consultants:    Neurology  Nephrology  Gastroenterology  Procedures:  EEG:1. Frontal intermittent rhythmic delta activities are observed. This is typically seen in toxic metabolic encephalopathies.  2. Triphasic waves are also observed typically seen in metabolic encephalopathies particularly due to hepatic failure or renal failure.  Antimicrobials:   Ciprofloxacin 5/30 >6/6  Flagyl 5/30>6/6   Subjective: Patient seen and evaluated this morning.  She appears to be somewhat more responsive, but continues to have ongoing diarrhea with 3 loose bowel movements overnight noted per staff.  She is also noted to have very poor oral intake that is persistent.  No other acute events noted.  Son is concerned about her needing feeding tube.  Objective: Vitals:   10/24/17 0459 10/24/17 1513 10/24/17 2243 10/25/17 0643  BP: 140/63 (!) 124/49 (!) 109/57 (!) 141/56  Pulse: 80 82 90 95  Resp: 18 18 20 18   Temp:  98.6 F (37 C) 98.2 F (36.8 C) (!) 97.5 F (36.4 C)  TempSrc:   Oral Oral  SpO2: (!) 86% 93% 95% 96%  Weight:      Height:        Intake/Output Summary (Last 24 hours) at 10/25/2017 1018 Last data filed at 10/24/2017 1700 Gross per 24 hour  Intake 760.42 ml  Output -  Net 760.42 ml   Filed Weights   10/17/17 0500 10/18/17 0400 10/20/17 0500  Weight: 80.6 kg (177 lb 11.1 oz) 81.7 kg (180 lb 1.9 oz) 85.7 kg (188 lb 15 oz)    Examination:  General exam: Alert, awake, nonverbal Respiratory system: Clear to auscultation. Respiratory effort normal. Cardiovascular system:RRR. No murmurs, rubs, gallops. Gastrointestinal system: Abdomen is nondistended, soft and nontender. No organomegaly or masses felt. Normal bowel sounds heard. Central nervous system: unable to assess due to mental status. Extremities: developing anasarca Skin: No rashes, lesions or ulcers Psychiatry: nonverbal    Data Reviewed: I have personally reviewed following labs and imaging studies  CBC: Recent Labs  Lab  10/20/17 0539 10/21/17 0618 10/24/17 0414 10/25/17 0719  WBC 14.1* 13.6* 7.2 5.6  HGB 10.2* 11.2* 9.3* 10.1*  HCT 32.0* 34.8* 28.2* 31.0*  MCV 82.1 81.1 81.3 80.9  PLT 337 459* 580* 295*   Basic Metabolic Panel: Recent Labs  Lab 10/19/17 0355 10/20/17 0539 10/21/17 0618 10/22/17 0805 10/24/17 0414 10/25/17 0719  NA 145 147* 148* 147* 152* 150*  K 3.6 3.5 3.4* 3.4* 3.7 3.9  CL 117* 119* 118* 119* 124* 121*  CO2 26 25 25 23 24 24   GLUCOSE 163* 216* 216* 202* 116* 137*  BUN 43* 34* 31* 38* 41* 41*  CREATININE 0.88 0.71 0.66 0.60 0.57 0.63  CALCIUM 7.3* 7.5* 7.8* 7.7* 7.5* 7.5*  MG  --   --   --  1.9  --  1.9  PHOS 2.0* 2.0*  --   --   --   --    GFR: Estimated Creatinine Clearance: 60.1 mL/min (by C-G formula based on SCr of 0.63 mg/dL). Liver Function Tests: Recent Labs  Lab 10/19/17 0355 10/20/17 0539  ALBUMIN 1.6* 1.7*   No results for input(s): LIPASE, AMYLASE in the last 168 hours. No results for input(s): AMMONIA in the last 168 hours. Coagulation Profile: No results for input(s): INR, PROTIME in the last 168 hours. Cardiac Enzymes: No results for input(s): CKTOTAL, CKMB,  CKMBINDEX, TROPONINI in the last 168 hours. BNP (last 3 results) No results for input(s): PROBNP in the last 8760 hours. HbA1C: No results for input(s): HGBA1C in the last 72 hours. CBG: Recent Labs  Lab 10/24/17 0751 10/24/17 1140 10/24/17 1826 10/24/17 2232 10/25/17 0814  GLUCAP 87 140* 141* 120* 109*   Lipid Profile: No results for input(s): CHOL, HDL, LDLCALC, TRIG, CHOLHDL, LDLDIRECT in the last 72 hours. Thyroid Function Tests: No results for input(s): TSH, T4TOTAL, FREET4, T3FREE, THYROIDAB in the last 72 hours. Anemia Panel: No results for input(s): VITAMINB12, FOLATE, FERRITIN, TIBC, IRON, RETICCTPCT in the last 72 hours. Sepsis Labs: No results for input(s): PROCALCITON, LATICACIDVEN in the last 168 hours.  No results found for this or any previous visit (from the  past 240 hour(s)).       Radiology Studies: No results found.      Scheduled Meds: . atorvastatin  40 mg Oral q1800  . bethanechol  25 mg Oral TID  . citalopram  20 mg Oral Daily  . clopidogrel  75 mg Oral Q breakfast  . enoxaparin (LOVENOX) injection  40 mg Subcutaneous Q24H  . feeding supplement (PRO-STAT SUGAR FREE 64)  30 mL Oral TID BM  . insulin aspart  0-15 Units Subcutaneous TID WC  . insulin aspart  0-5 Units Subcutaneous QHS  . levETIRAcetam  500 mg Oral BID  . mouth rinse  15 mL Mouth Rinse BID  . metoprolol tartrate  25 mg Oral BID  . senna-docusate  2 tablet Oral QHS  . tamsulosin  0.4 mg Oral QPC supper   Continuous Infusions: . sodium chloride 50 mL/hr at 10/25/17 0838     LOS: 12 days    Time spent: 30 minutes    Locklyn Henriquez Darleen Crocker, DO [facility] Pager [phone]  If 7PM-7AM, please contact night-coverage [email or web address] Password [email or web address] 10/25/2017, 10:18 AM

## 2017-10-26 DIAGNOSIS — I69353 Hemiplegia and hemiparesis following cerebral infarction affecting right non-dominant side: Secondary | ICD-10-CM

## 2017-10-26 DIAGNOSIS — I69391 Dysphagia following cerebral infarction: Secondary | ICD-10-CM

## 2017-10-26 DIAGNOSIS — I639 Cerebral infarction, unspecified: Secondary | ICD-10-CM

## 2017-10-26 DIAGNOSIS — I10 Essential (primary) hypertension: Secondary | ICD-10-CM

## 2017-10-26 LAB — CBC
HEMATOCRIT: 28.5 % — AB (ref 36.0–46.0)
Hemoglobin: 9.3 g/dL — ABNORMAL LOW (ref 12.0–15.0)
MCH: 26.6 pg (ref 26.0–34.0)
MCHC: 32.6 g/dL (ref 30.0–36.0)
MCV: 81.7 fL (ref 78.0–100.0)
PLATELETS: 605 10*3/uL — AB (ref 150–400)
RBC: 3.49 MIL/uL — ABNORMAL LOW (ref 3.87–5.11)
RDW: 17.7 % — AB (ref 11.5–15.5)
WBC: 5.2 10*3/uL (ref 4.0–10.5)

## 2017-10-26 LAB — BASIC METABOLIC PANEL
Anion gap: 6 (ref 5–15)
BUN: 42 mg/dL — AB (ref 6–20)
CHLORIDE: 121 mmol/L — AB (ref 101–111)
CO2: 24 mmol/L (ref 22–32)
CREATININE: 0.65 mg/dL (ref 0.44–1.00)
Calcium: 7.3 mg/dL — ABNORMAL LOW (ref 8.9–10.3)
GFR calc Af Amer: >60 mL/min (ref >60)
GFR calc non Af Amer: >60 mL/min (ref >60)
GLUCOSE: 274 mg/dL — AB (ref 65–99)
POTASSIUM: 3.7 mmol/L (ref 3.5–5.1)
SODIUM: 151 mmol/L — AB (ref 135–145)

## 2017-10-26 LAB — GLUCOSE, CAPILLARY
GLUCOSE-CAPILLARY: 258 mg/dL — AB (ref 65–99)
GLUCOSE-CAPILLARY: 252 mg/dL — AB (ref 65–99)
GLUCOSE-CAPILLARY: 167 mg/dL — AB (ref 65–99)
GLUCOSE-CAPILLARY: 97 mg/dL (ref 65–99)
GLUCOSE-CAPILLARY: 79 mg/dL (ref 65–99)

## 2017-10-26 MED ORDER — PRO-STAT SUGAR FREE PO LIQD
30.0000 mL | Freq: Two times a day (BID) | ORAL | Status: DC
  Administered 2017-10-26 – 2017-10-29 (×5): 30 mL via ORAL
  Filled 2017-10-26 (×4): qty 30

## 2017-10-26 NOTE — Progress Notes (Signed)
All medications including prostat and liquid medication given PO, no tube was placed. No tube to be placed at this time.

## 2017-10-26 NOTE — Progress Notes (Signed)
    Subjective: Resting in bed. Per day shift nursing staff, unsure stool output overnight but last charted was yesterday evening. Stool with some consistency, not watery.   Objective: Vital signs in last 24 hours: Temp:  [97.3 F (36.3 C)-98.1 F (36.7 C)] 97.3 F (36.3 C) (06/12 0544) Pulse Rate:  [88-91] 88 (06/12 0544) Resp:  [16-20] 20 (06/12 0544) BP: (110-136)/(31-64) 110/46 (06/12 0544) SpO2:  [88 %-98 %] 88 % (06/12 0544) Last BM Date: 10/25/17 General:   Resting in bed with eyes closed, moves head when talking but not opening eyes  Abdomen:  Bowel sounds present, obese, grimaces when palpating LLQ. No distension.  Extremities:  With 1+ lower extremity edema  Neurologic:  Unable to assess    Intake/Output from previous day: 06/11 0701 - 06/12 0700 In: 1332.5 [P.O.:265; I.V.:1067.5] Out: 500 [Urine:500] Intake/Output this shift: No intake/output data recorded.  Lab Results: Recent Labs    10/24/17 0414 10/25/17 0719 10/26/17 0618  WBC 7.2 5.6 5.2  HGB 9.3* 10.1* 9.3*  HCT 28.2* 31.0* 28.5*  PLT 580* 613* 605*   BMET Recent Labs    10/24/17 0414 10/25/17 0719 10/26/17 0618  NA 152* 150* 151*  K 3.7 3.9 3.7  CL 124* 121* 121*  CO2 24 24 24   GLUCOSE 116* 137* 274*  BUN 41* 41* 42*  CREATININE 0.57 0.63 0.65  CALCIUM 7.5* 7.5* 7.3*     Assessment: 77 year old female with multiple medical issues, s/p stroke Jan 2019, admitted with CT scan findings of colitis, acute renal failure, acute on chronic encephalopathy, UTI, severe malnutrition. Her oral intake has been poor on dysphagia 1/puree diet per speech recommendations. Palliative is following due to decline in health status, poor prognosis, and she is now DNR. GI consultation yesterday to discuss risks and benefits of enteral feeding. Not a candidate for PEG tube placement. NG tube placement risks outweigh the benefits. Recommending hospice care. Dysphagia 1 diet as tolerated.   Regarding loose stools:  Senna held yesterday evening. Stool output not characteristic of CDI. Appears last charted stool output yesterday evening. Discussed with nursing staff today to monitor for any evidence of profuse, watery diarrhea. GI pathogen panel negative; however, cdiff toxin is not included in this. Doubt dealing with CDI at this time. Diarrhea multifactorial in this setting.   Plan: Will sign off Recommend hospice care as not a candidate for PEG and risks of NG tube outweigh benefits at this time.   Annitta Needs, PhD, ANP-BC [facility]    LOS: 13 days    10/26/2017, 7:59 AM

## 2017-10-26 NOTE — Care Management Note (Signed)
Case Management Note  Patient Details  Name: Sandra [name] MRN: [id number] Date of Birth: [birth year]  If discussed at Long Length of Stay Meetings, dates discussed:  10/26/17  Additional Comments:  Sherald Barge, RN 10/26/2017, 8:27 AM

## 2017-10-26 NOTE — Progress Notes (Signed)
Palliative; Conversation with hospitalist related to plan of care. Sandra [name] is resting quietly in bed.  She does not open her eyes or look at me as I enter.  She is sleeping so soundly I do not attempt to wake her.  Report received from nursing staff related to her sleepiness today, desire to take medications, ability to communicate.   Meeting with son Sandra [name].  We talked about GI physician recommendation of no temporary feeding tube.  Son Sandra [name] asks for prognosis.  I share that I would not be surprised if Sandra [name] had anywhere from 2 to 12 weeks, depending on her desire and ability to take liquids, risk for infection, family's desire for aggressive care versus comfort care. He states that his family should be arriving tomorrow, he wants input from his eldest sister.  I share that I am willing to have a family meeting with CM if they so desire.  Letter written stating that Sandra [name] does not have the ability to manage her own affairs. Conversation with GI physician feels related to plan of care. Follow-up with hospitalist related to plan of care. 84 minutes Quinn Axe, NP Palliative Medicine Team Team Phone # [phone]

## 2017-10-26 NOTE — Progress Notes (Signed)
Physical Therapy Treatment Patient Details Name: Sandra [name] MRN: [id number] DOB: Aug 27, 1940 Today's Date: 10/26/2017    History of Present Illness Sandra [name]  is a 77 y.o. female,with a history of prior CVA with global aphasia, diabetes mellitus, hypertension, peripheral artery disease was sent from skilled care facility for evaluation of vomiting.  There was concern for small bowel obstruction.  In the ED CT scan of the abdomen and pelvis showed sigmoid colitis. Patient has global aphasia, nonverbal, unable to provide any history    PT Comments    Patient receive supine in bed with eyes closed and lethargic. She responded to tactile and auditory stimuli to increase arousal and was agreeable to therapy, patient non-verbal but nodded head.  Nurse Rockie Neighbours, provided assistance for supine to sit transfer with [facility] elevated as patient continues to require Total assist (2 person) for bed mobility. In sitting this session patient required Mod A to prevent facilitate upright sitting and deter Lt lateral trunk lean and anterior trunk lean. Patient demonstrated intermittent reaching with Lt UE bu had difficulty following simple 1-step commands. Upon return to supine attempted Rt LE muscle activation, patient flexed hip 1x with cue to "kick leg up" but unable to follow further commands. Patient grimaced several times thought session indicating discomfort with transfer. At EOS pt left in bed with prevalon boot on Rt LE, pillows adjusted for promote optimal alignment and prevent Rt hip external rotation, as well as support Bil UE to address edema. I informed the RN, Amy, that the patient would benefit from Ted hose or SCD's to prevent LE edema due to lack of movement. Acute PT will follow.   Follow Up Recommendations  SNF     Equipment Recommendations  None recommended by PT    Recommendations for Other Services       Precautions / Restrictions Precautions Precautions: Fall Precaution  Comments: contact precautions (e. coli ESBL) Restrictions Weight Bearing Restrictions: No    Mobility  Bed Mobility Overal bed mobility: Needs Assistance Bed Mobility: Supine to Sit;Sit to Supine     Supine to sit: Max assist;+2 for physical assistance;HOB elevated Sit to supine: Max assist;+2 for physical assistance     Transfers  General transfer comment: n/a - did not attempt due to safety concerns related to deficits in bed mobility and sitting balance     Balance Overall balance assessment: Needs assistance Sitting-balance support: Feet supported;Bilateral upper extremity supported Sitting balance-Leahy Scale: Poor Sitting balance - Comments: sitting EOB - max verbal and tactile cues to sit up tall and decrease Lt lateral lean; mod A to maintain upright sitting posture; patietn with forward flexed trunk lean this session Postural control: Other (comment);Left lateral lean(forward/flexed trunk len)       Cognition Arousal/Alertness: Lethargic Behavior During Therapy: Flat affect Overall Cognitive Status: History of cognitive impairments - at baseline    General Comments: global aphasia since CVA in January 2019         General Comments General comments (skin integrity, edema, etc.): Patient with increased bil LE edema and bil UE edema, Rt hand and forearm notably colder than Rt shoulder region as well as contralateral extremity. Notified RN.      Pertinent Vitals/Pain Pain Assessment: 0-10 Faces Pain Scale: Hurts a little bit Pain Location: minimal facial expressions while rolling to Lt side Pain Descriptors / Indicators: Grimacing;Moaning Pain Intervention(s): Limited activity within patient's tolerance;Monitored during session           PT Goals (current goals can  now be found in the care plan section) Acute Rehab PT Goals Patient Stated Goal: to improve PT Goal Formulation: With patient/family Time For Goal Achievement: 10/28/17 Potential to Achieve Goals:  Fair Progress towards PT goals: Progressing toward goals(slow progression with patietn limited by faitgue and significant weakness)    Frequency    Min 4X/week      PT Plan Current plan remains appropriate       AM-PAC PT "6 Clicks" Daily Activity  Outcome Measure  Difficulty turning over in bed (including adjusting bedclothes, sheets and blankets)?: Unable Difficulty moving from lying on back to sitting on the side of the bed? : Unable Difficulty sitting down on and standing up from a chair with arms (e.g., wheelchair, bedside commode, etc,.)?: Unable Help needed moving to and from a bed to chair (including a wheelchair)?: Total Help needed walking in hospital room?: Total Help needed climbing 3-5 steps with a railing? : Total 6 Click Score: 6    End of Session   Activity Tolerance: Patient tolerated treatment well;Patient limited by lethargy Patient left: in bed;with call bell/phone within reach;with bed alarm set(with prevalon boot on Rt foot) Nurse Communication: Mobility status;Need for lift equipment PT Visit Diagnosis: Muscle weakness (generalized) (M62.81);Hemiplegia and hemiparesis Hemiplegia - Right/Left: Right Hemiplegia - dominant/non-dominant: Dominant Hemiplegia - caused by: Cerebral infarction     Time: 8889-1694 PT Time Calculation (min) (ACUTE ONLY): 29 min  Charges:  $Therapeutic Activity: 23-37 mins                    G Codes:       Kipp Brood, PT, DPT Physical Therapist with [facility]  10/26/2017 11:11 AM

## 2017-10-26 NOTE — Progress Notes (Signed)
PROGRESS NOTE    Sandra [name]  IRS:[id number] DOB: 1940/06/02 DOA: 10/12/2017 PCP: Caren Macadam, MD    Brief Narrative:  77 year old female admitted to the hospital from skilled nursing facility with abdominal pain. She was found to have colitis with possible fecal impaction on admission.  She has thus far completed a full course of IV ciprofloxacin and Flagyl.  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 Neurology following. She also had worsening renal function which is now improved and was followed by Nephrology at one point.  Mental status remains stable, but oral intake continues to remain poor.  She has developed some worsening diarrhea over the last 2 days for which GI panel is currently pending.  Palliative care assisting in discussions with family members and patient is now DNR.  Would benefit of hospice, comfort care and symptoms management only. Renal service and GI sign off.   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 Colitis.  Improved with Flagyl and Cipro DC after 7 day course of treatment on 6/6. Now with recurrence of diarrhea for which GI panel was ordered and negative. Will follow BM's.  2. Acute renal failure-resolved with IVF's. now with hypernatremia.  She did receive some IV contrast on admission.  Nephrology has recommended some IV fluid with half-normal saline; after improvement, they have sign off. Follow renal  function and electrolytes intermittently.  3. Vtach. No abnormalities on telemetry in the last 48 hours. Patient now DNR. Will stop telemetry.  4. Partial seizures-resolved.  Resolved after Keppra. Continue to monitor. 5. Acute on chronic encephalopathy, likely metabolic.  Overall prognosis is poor. ABG and ammonia were unremarkable.  Possibly related to dehydration/urinary tract infection.  At baseline she has aphasia which makes communication difficult. Patient DNR now. 6. Diabetes. Continue sliding scale coverage. BG ok while on D5 drip. Continue to follow CBG. 7. Previous stroke.  She has residual right-sided hemiparesis and global aphasia.  Continue on Plavix.  MRI performed during this hospitalization was poor quality; but no major acute changes appreciated. 8. Possible fecal impaction.  Resolved with enemas and digital disimpaction. Will follow BM's. 9. UTI-resolved. Urinalysis indicates pyuria.  Urine culture showed no growth. No dysuria, fever or abnormal WBC's. 10. Hypokalemia-mild.  Resolved. Continue potassium supplementation in IVF. 11. Severe protein calorie malnutrition.  P.o. intake remains poor.  Currently not looking to pursuit NGT or PEG placement. Continue current diet.  12. Depression. Continue celexa.   DVT prophylaxis: Lovenox Code Status: DNR Family Communication: Son at bedside  Disposition Plan: continue to have poor oral intake. After discussing with family and following rec's by GI, will hold on NGT and PEG. GOC and final dispo during meeting with family in am. Overall poor prognosis.   Consultants:   Neurology  Nephrology  Gastroenterology  Procedures:  EEG:1. Frontal intermittent rhythmic delta activities are observed. This is typically seen in toxic metabolic  encephalopathies.  2. Triphasic waves are also observed typically seen in metabolic encephalopathies particularly due to hepatic failure or renal failure.  Antimicrobials:   Ciprofloxacin 5/30  >6/6  Flagyl 5/30>6/6   Subjective: Non verbal, afebrile. No further diarrhea reported by nursing staff. Patient continue to have poor oral intake.   Objective: Vitals:   10/25/17 2110 10/26/17 0544 10/26/17 1406 10/26/17 2130  BP: (!) 126/31 (!) 110/46 (!) 116/42 134/63  Pulse: 91 88 92 (!) 106  Resp: 16 20 18    Temp: 97.9 F (36.6 C) (!) 97.3 F (36.3 C) 97.6 F (36.4 C) 98.2 F (36.8 C)  TempSrc: Oral Oral Oral Oral  SpO2: 97% (!) 88% 100% 96%  Weight:      Height:        Intake/Output Summary (Last 24 hours) at 10/26/2017 2135 Last data filed at 10/26/2017 1900 Gross per 24 hour  Intake 1758.33 ml  Output 1700 ml  Net 58.33 ml   Filed Weights   10/17/17 0500 10/18/17 0400 10/20/17 0500  Weight: 80.6 kg (177 lb 11.1 oz) 81.7 kg (180 lb 1.9 oz) 85.7 kg (188 lb 15 oz)    Examination: General exam: Alert, awake, afebrile and in no distress. Non-verbal. able to move her head for yes/no questions. No further diarrhea reported; continue to have poor oral intake.  Respiratory system: Clear to auscultation. Respiratory effort normal.no requiring oxygen supplementation. Cardiovascular system: rate controlled, no rubs, no gallops, no JVD. Gastrointestinal system: Abdomen is nondistended, soft and nontender. No organomegaly or masses felt. Normal bowel sounds heard. Central nervous system: moving left upper and LLE spontaneously; patient with right hemiparesis, non verbal.  Extremities: no cyanosis, no clubbing, positive trace to 1+ edema.  Skin: No rashes, lesions or ulcers Psychiatry: no agitation, calmed.   Data Reviewed: I have personally reviewed following labs and imaging studies  CBC: Recent Labs  Lab 10/20/17 0539 10/21/17 0618 10/24/17 0414 10/25/17 0719 10/26/17 0618  WBC 14.1* 13.6* 7.2 5.6 5.2  HGB 10.2* 11.2* 9.3* 10.1* 9.3*  HCT 32.0* 34.8* 28.2* 31.0* 28.5*  MCV 82.1 81.1 81.3 80.9 81.7  PLT 337 459* 580* 613* 952*   Basic Metabolic Panel: Recent  Labs  Lab 10/20/17 0539 10/21/17 0618 10/22/17 0805 10/24/17 0414 10/25/17 0719 10/26/17 0618  NA 147* 148* 147* 152* 150* 151*  K 3.5 3.4* 3.4* 3.7 3.9 3.7  CL 119* 118* 119* 124* 121* 121*  CO2 25 25 23 24 24 24   GLUCOSE 216* 216* 202* 116* 137* 274*  BUN 34* 31* 38* 41* 41* 42*  CREATININE 0.71 0.66 0.60 0.57 0.63 0.65  CALCIUM 7.5* 7.8* 7.7* 7.5* 7.5* 7.3*  MG  --   --  1.9  --  1.9  --   PHOS 2.0*  --   --   --   --   --    GFR: Estimated Creatinine Clearance: 60.1 mL/min (by C-G formula based on SCr of 0.65 mg/dL).   Liver Function Tests: Recent Labs  Lab 10/20/17 0539  ALBUMIN 1.7*   CBG: Recent Labs  Lab 10/25/17 2241 10/26/17 0546 10/26/17 0745 10/26/17 1134 10/26/17 1703  GLUCAP 199* 258* 252* 167* 97    Recent Results (from the past 240 hour(s))  Gastrointestinal Panel by PCR , Stool     Status: None   Collection Time: 10/24/17  8:53 AM  Result Value Ref Range Status   Campylobacter species NOT DETECTED NOT DETECTED Final   Plesimonas shigelloides NOT DETECTED NOT DETECTED Final  Salmonella species NOT DETECTED NOT DETECTED Final   Yersinia enterocolitica NOT DETECTED NOT DETECTED Final   Vibrio species NOT DETECTED NOT DETECTED Final   Vibrio cholerae NOT DETECTED NOT DETECTED Final   Enteroaggregative E coli (EAEC) NOT DETECTED NOT DETECTED Final   Enteropathogenic E coli (EPEC) NOT DETECTED NOT DETECTED Final   Enterotoxigenic E coli (ETEC) NOT DETECTED NOT DETECTED Final   Shiga like toxin producing E coli (STEC) NOT DETECTED NOT DETECTED Final   Shigella/Enteroinvasive E coli (EIEC) NOT DETECTED NOT DETECTED Final   Cryptosporidium NOT DETECTED NOT DETECTED Final   Cyclospora cayetanensis NOT DETECTED NOT DETECTED Final   Entamoeba histolytica NOT DETECTED NOT DETECTED Final   Giardia lamblia NOT DETECTED NOT DETECTED Final   Adenovirus F40/41 NOT DETECTED NOT DETECTED Final   Astrovirus NOT DETECTED NOT DETECTED Final   Norovirus GI/GII  NOT DETECTED NOT DETECTED Final   Rotavirus A NOT DETECTED NOT DETECTED Final   Sapovirus (I, II, IV, and V) NOT DETECTED NOT DETECTED Final    Comment: Performed at [facility] [address] [place], [place] [address]     Scheduled Meds: . atorvastatin  40 mg Oral q1800  . bethanechol  25 mg Oral TID  . citalopram  20 mg Oral Daily  . clopidogrel  75 mg Oral Q breakfast  . enoxaparin (LOVENOX) injection  40 mg Subcutaneous Q24H  . feeding supplement (PRO-STAT SUGAR FREE 64)  30 mL Oral BID  . insulin aspart  0-15 Units Subcutaneous TID WC  . insulin aspart  0-5 Units Subcutaneous QHS  . levETIRAcetam  500 mg Oral BID  . mouth rinse  15 mL Mouth Rinse BID  . metoprolol tartrate  25 mg Oral BID  . multivitamin  15 mL Oral Daily  . tamsulosin  0.4 mg Oral QPC supper   Continuous Infusions: . sodium chloride 50 mL/hr at 10/26/17 0508     LOS: 13 days    Time spent: 30 minutes    Barton Dubois, MD [facility] Pager [phone]  If 7PM-7AM, please contact night-coverage [email or web address] Password [facility] 10/26/2017, 9:35 PM

## 2017-10-26 NOTE — Care Management Important Message (Signed)
Important Message  Patient Details  Name: Sandra [name] MRN: [id number] Date of Birth: May 03, 1941   Medicare Important Message Given:  Yes    [name] 10/26/2017, 11:50 AM

## 2017-10-26 NOTE — Progress Notes (Signed)
Nutrition Brief Note   Discussed pt with RN- tube has not been placed. According to GI note no tube now planned. Tube feeding orders discontinued and prostat changed back to oral route.   Hospice being recommended.  Current diet order is Dysphagia 1 and po remains poor. Labs and medications reviewed.   No further nutrition interventions warranted. RD signing off.     Colman Cater MS,RD,CSG,LDN Office: [phone] Pager: [phone]

## 2017-10-27 LAB — GLUCOSE, CAPILLARY
GLUCOSE-CAPILLARY: 202 mg/dL — AB (ref 65–99)
GLUCOSE-CAPILLARY: 167 mg/dL — AB (ref 65–99)
Glucose-Capillary: 102 mg/dL — ABNORMAL HIGH (ref 65–99)
Glucose-Capillary: 144 mg/dL — ABNORMAL HIGH (ref 65–99)

## 2017-10-27 LAB — CREATININE, SERUM: Creatinine, Ser: 0.6 mg/dL (ref 0.44–1.00)

## 2017-10-27 NOTE — Progress Notes (Signed)
PROGRESS NOTE    Sandra [name]  YQM:[id number] DOB: [birth year] DOA: 10/12/2017 PCP: Caren Macadam, MD    Brief Narrative:  77 year old female admitted to the hospital from skilled nursing facility with abdominal pain. She was found to have colitis with possible fecal impaction on admission.  She has thus far completed a full course of IV ciprofloxacin and Flagyl.  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 Neurology following. She also had worsening renal function which is now improved and was followed by Nephrology at one point.  Mental status remains stable, but oral intake continues to remain poor.  She has developed some worsening diarrhea over the last 2 days for which GI panel is currently pending.  Palliative care assisting in discussions with family members and patient is now DNR.  Would benefit of hospice, comfort care and symptoms management only. Renal service and GI sign off.   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 Colitis.  Improved with Flagyl and Cipro DC after 7 day course of treatment on 6/6. Now with recurrence of diarrhea for which GI panel was ordered and negative.  Continue to follow bowel movements habits. 2. Acute renal failure-resolved with IVF's. now with hypernatremia.  She did receive some IV contrast on admission.  Nephrology has recommended some IV fluid with half-normal saline; after improvement, they  have sign off. Follow renal function and electrolytes intermittently.  3. Vtach. Patient now DNR.  Rate has remained stable and controlled.  Telemetry discontinued 10/26/2017. 4. Partial seizures-resolved.  Resolved after Keppra. Continue Keppra and continue to monitor for any seizure activity.  5. Acute on chronic encephalopathy, likely metabolic.  Overall prognosis is poor. ABG and ammonia were unremarkable.  Possibly related to dehydration/urinary tract infection.  At baseline she has aphasia which makes communication difficult. Patient DNR now. 6. Diabetes. Continue sliding scale coverage. BG ok while on D5 drip. Continue to follow CBG. 7. Previous stroke.  She has residual right-sided hemiparesis and global aphasia.  Continue on Plavix.  MRI performed during this hospitalization was poor quality; but no major acute changes appreciated. 8. Possible fecal impaction.  Resolved with enemas and digital disimpaction. Will follow BM's. 9. UTI-resolved. Urinalysis indicates pyuria.  Urine culture showed no growth. No dysuria, fever or abnormal WBC's. 10. Hypokalemia-mild.  Resolved. Continue potassium supplementation in IVF. 11. Severe protein calorie malnutrition.  P.o. intake remains poor.  Currently not looking to pursuit NGT or PEG placement. Continue current diet.  12. Depression. Continue celexa.   DVT prophylaxis: Lovenox Code Status: DNR Family Communication: Son at bedside  Disposition Plan: continue to have poor oral intake. After discussing with family and following rec's by GI, will hold on NGT and PEG. GOC and final dispo during meeting with family tomorrow morning, as family unable to make it here today.  Consultants:   Neurology  Nephrology  Gastroenterology  Procedures:  EEG:1. Frontal  intermittent rhythmic delta activities are observed. This is typically seen in toxic metabolic encephalopathies.  2. Triphasic waves are also observed typically seen in metabolic  encephalopathies particularly due to hepatic failure or renal failure.  Antimicrobials:   Ciprofloxacin 5/30 >6/6  Flagyl 5/30>6/6   Subjective: In no acute distress, no fever, per nursing report no further diarrhea.  Continued to have poor oral intake.  Patient's denies chest pain, abdominal pain appears to be breathing comfortable.  Objective: Vitals:   10/26/17 0544 10/26/17 1406 10/26/17 2130 10/27/17 0657  BP: (!) 110/46 (!) 116/42 134/63 119/73  Pulse: 88 92 (!) 106 92  Resp: 20 18    Temp: (!) 97.3 F (36.3 C) 97.6 F (36.4 C) 98.2 F (36.8 C) 98 F (36.7 C)  TempSrc: Oral Oral Oral Oral  SpO2: (!) 88% 100% 96% 100%  Weight:      Height:        Intake/Output Summary (Last 24 hours) at 10/27/2017 1251 Last data filed at 10/27/2017 1007 Gross per 24 hour  Intake 510.83 ml  Output 1500 ml  Net -989.17 ml   Filed Weights   10/17/17 0500 10/18/17 0400 10/20/17 0500  Weight: 80.6 kg (177 lb 11.1 oz) 81.7 kg (180 lb 1.9 oz) 85.7 kg (188 lb 15 oz)    Examination: General exam: Alert, awake, nonverbal, afebrile and in no distress.  Per nursing staff reports no further diarrhea.  Oral intake continues to be poor overall. Respiratory system: Clear to auscultation. Respiratory effort normal. Cardiovascular system:RRR. No murmurs, rubs, gallops. Gastrointestinal system: Abdomen is nondistended, soft and nontender. No organomegaly or masses felt. Normal bowel sounds heard. Central nervous system: Alert and oriented. No focal neurological deficits. Extremities: No C/C/E, trace to 1+ edema bilaterally Skin: No rashes, lesions or ulcers Psychiatry: No agitation, calm and answering by nodding her head yes or no to simple questions.   Data Reviewed: I have personally reviewed following labs and imaging studies  CBC: Recent Labs  Lab 10/21/17 0618 10/24/17 0414 10/25/17 0719 10/26/17 0618  WBC 13.6* 7.2 5.6 5.2  HGB 11.2* 9.3* 10.1* 9.3*  HCT 34.8* 28.2* 31.0* 28.5*    MCV 81.1 81.3 80.9 81.7  PLT 459* 580* 613* 193*   Basic Metabolic Panel: Recent Labs  Lab 10/21/17 0618 10/22/17 0805 10/24/17 0414 10/25/17 0719 10/26/17 0618 10/27/17 0449  NA 148* 147* 152* 150* 151*  --   K 3.4* 3.4* 3.7 3.9 3.7  --   CL 118* 119* 124* 121* 121*  --   CO2 25 23 24 24 24   --   GLUCOSE 216* 202* 116* 137* 274*  --   BUN 31* 38* 41* 41* 42*  --   CREATININE 0.66 0.60 0.57 0.63 0.65 0.60  CALCIUM 7.8* 7.7* 7.5* 7.5* 7.3*  --   MG  --  1.9  --  1.9  --   --    GFR: Estimated Creatinine Clearance: 60.1 mL/min (by C-G formula based on SCr of 0.6 mg/dL).   CBG: Recent Labs  Lab 10/26/17 1134 10/26/17 1703 10/26/17 2152 10/27/17 0735 10/27/17 1127  GLUCAP 167* 97 79 102* 144*    Recent Results (from the past 240 hour(s))  Gastrointestinal Panel by PCR , Stool     Status: None   Collection Time: 10/24/17  8:53 AM  Result Value Ref Range Status   Campylobacter species NOT DETECTED NOT DETECTED Final   Plesimonas shigelloides NOT DETECTED NOT DETECTED Final   Salmonella species NOT DETECTED NOT  DETECTED Final   Yersinia enterocolitica NOT DETECTED NOT DETECTED Final   Vibrio species NOT DETECTED NOT DETECTED Final   Vibrio cholerae NOT DETECTED NOT DETECTED Final   Enteroaggregative E coli (EAEC) NOT DETECTED NOT DETECTED Final   Enteropathogenic E coli (EPEC) NOT DETECTED NOT DETECTED Final   Enterotoxigenic E coli (ETEC) NOT DETECTED NOT DETECTED Final   Shiga like toxin producing E coli (STEC) NOT DETECTED NOT DETECTED Final   Shigella/Enteroinvasive E coli (EIEC) NOT DETECTED NOT DETECTED Final   Cryptosporidium NOT DETECTED NOT DETECTED Final   Cyclospora cayetanensis NOT DETECTED NOT DETECTED Final   Entamoeba histolytica NOT DETECTED NOT DETECTED Final   Giardia lamblia NOT DETECTED NOT DETECTED Final   Adenovirus F40/41 NOT DETECTED NOT DETECTED Final   Astrovirus NOT DETECTED NOT DETECTED Final   Norovirus GI/GII NOT DETECTED NOT DETECTED  Final   Rotavirus A NOT DETECTED NOT DETECTED Final   Sapovirus (I, II, IV, and V) NOT DETECTED NOT DETECTED Final    Comment: Performed at [facility], [address] [place],  [address]     Scheduled Meds: . atorvastatin  40 mg Oral q1800  . bethanechol  25 mg Oral TID  . citalopram  20 mg Oral Daily  . clopidogrel  75 mg Oral Q breakfast  . enoxaparin (LOVENOX) injection  40 mg Subcutaneous Q24H  . feeding supplement (PRO-STAT SUGAR FREE 64)  30 mL Oral BID  . insulin aspart  0-15 Units Subcutaneous TID WC  . insulin aspart  0-5 Units Subcutaneous QHS  . levETIRAcetam  500 mg Oral BID  . mouth rinse  15 mL Mouth Rinse BID  . metoprolol tartrate  25 mg Oral BID  . multivitamin  15 mL Oral Daily  . tamsulosin  0.4 mg Oral QPC supper   Continuous Infusions: . sodium chloride 50 mL/hr at 10/26/17 0508     LOS: 14 days    Time spent: 30 minutes    Barton Dubois, MD [facility] Pager [phone]  If 7PM-7AM, please contact night-coverage [email or web address] Password [facility] 10/27/2017, 12:51 PM

## 2017-10-27 NOTE — Progress Notes (Signed)
Physical Therapy Treatment Patient Details Name: Sandra [name] MRN: [id number] DOB: [birth year] Today's Date: 10/27/2017    History of Present Illness Sandra [name]  is a 77 y.o. female,with a history of prior CVA with global aphasia, diabetes mellitus, hypertension, peripheral artery disease was sent from skilled care facility for evaluation of vomiting.  There was concern for small bowel obstruction.  In the ED CT scan of the abdomen and pelvis showed sigmoid colitis. Patient has global aphasia, nonverbal, unable to provide any history    PT Comments    Pt with improved awareness/arousal at therapist entrance and more moaning trying to communicate.  Max A x 2 with bed mobility transfer supine to sitting.  Pt with bowel movement during transfer to side, nurse tech notified and cleansed during mobility.  Improved sitting balance with mod A for posterior/lateral lean, improved response following cueing to improve seated posture with less assistance.  EOS pt left in bed with call bell within reach, bed alarm set and boot on Rt foot with pillow under feels for pressure relief.    Follow Up Recommendations  SNF     Equipment Recommendations  None recommended by PT    Recommendations for Other Services       Precautions / Restrictions Precautions Precautions: Fall Precaution Comments: contact precautions (e. coli ESBL) Restrictions Weight Bearing Restrictions: No    Mobility  Bed Mobility Overal bed mobility: Needs Assistance Bed Mobility: Supine to Sit;Sit to Supine     Supine to sit: Max assist;+2 for physical assistance;HOB elevated Sit to supine: Max assist;+2 for physical assistance      Transfers                 General transfer comment: n/a - did not attempt due to safety concerns related to deficits in bed mobility and sitting balance  Ambulation/Gait                 Stairs             Wheelchair Mobility    Modified Rankin (Stroke  Patients Only)       Balance Overall balance assessment: Needs assistance Sitting-balance support: Feet supported;Bilateral upper extremity supported Sitting balance-Leahy Scale: Poor Sitting balance - Comments: sitting EOB- mod A to reduce posterior/lateral lean with verbal and tactile cueing to sit up tall Postural control: Posterior lean;Left lateral lean                                  Cognition Arousal/Alertness: Awake/alert Behavior During Therapy: Flat affect Overall Cognitive Status: History of cognitive impairments - at baseline                                 General Comments: global aphasia since CVA in January 2019      Exercises General Exercises - Upper Extremity Shoulder Flexion: PROM;5 reps;Right;Left General Exercises - Lower Extremity Ankle Circles/Pumps: PROM;5 reps;Left(Rt foot in boot, did not remove this session) Long Arc Quad: PROM;5 reps;Right;Left    General Comments        Pertinent Vitals/Pain Faces Pain Scale: Hurts a little bit Pain Location: minimal facial expressions while rolling to Lt side Pain Descriptors / Indicators: Grimacing;Moaning Pain Intervention(s): Limited activity within patient's tolerance;Monitored during session;Repositioned    Home Living  Prior Function            PT Goals (current goals can now be found in the care plan section)      Frequency    Min 4X/week      PT Plan Current plan remains appropriate    Co-evaluation              AM-PAC PT "6 Clicks" Daily Activity  Outcome Measure  Difficulty turning over in bed (including adjusting bedclothes, sheets and blankets)?: Unable Difficulty moving from lying on back to sitting on the side of the bed? : Unable Difficulty sitting down on and standing up from a chair with arms (e.g., wheelchair, bedside commode, etc,.)?: Unable Help needed moving to and from a bed to chair (including a  wheelchair)?: Total Help needed walking in hospital room?: Total Help needed climbing 3-5 steps with a railing? : Total 6 Click Score: 6    End of Session Equipment Utilized During Treatment: Oxygen Activity Tolerance: Patient tolerated treatment well;Patient limited by fatigue Patient left: in bed;with call bell/phone within reach;with bed alarm set(with prevalon boot on Rt foot and pillow under heels for pressure relief) Nurse Communication: Mobility status;Need for lift equipment PT Visit Diagnosis: Muscle weakness (generalized) (M62.81);Hemiplegia and hemiparesis Hemiplegia - Right/Left: Right Hemiplegia - dominant/non-dominant: Dominant Hemiplegia - caused by: Cerebral infarction     Time: 6644-0347 PT Time Calculation (min) (ACUTE ONLY): 23 min  Charges:  $Therapeutic Activity: 23-37 mins                    G Codes:       Sandra [name], LPTA; CBIS [phone]   Sandra [name] 10/27/2017, 3:27 PM

## 2017-10-28 LAB — GLUCOSE, CAPILLARY
GLUCOSE-CAPILLARY: 163 mg/dL — AB (ref 65–99)
Glucose-Capillary: 202 mg/dL — ABNORMAL HIGH (ref 65–99)
Glucose-Capillary: 250 mg/dL — ABNORMAL HIGH (ref 65–99)
Glucose-Capillary: 123 mg/dL — ABNORMAL HIGH (ref 65–99)

## 2017-10-28 NOTE — Progress Notes (Signed)
Physical Therapy Treatment Patient Details Name: Sandra [name] MRN: [id number] DOB: [birth year] Today's Date: 10/28/2017    History of Present Illness Sandra [name]  is a 77 y.o. female,with a history of prior CVA with global aphasia, diabetes mellitus, hypertension, peripheral artery disease was sent from skilled care facility for evaluation of vomiting.  There was concern for small bowel obstruction.  In the ED CT scan of the abdomen and pelvis showed sigmoid colitis. Patient has global aphasia, nonverbal, unable to provide any history    PT Comments    Pt more awake/increased arousal this session.  2 Max assist still required for bed mobility.  Mod A with sitting balance to reduce posterior lean.  Improved follow-thru with Lt UE movements to assist with bed mobility by reaching for arm rest as well as reached for arm rest and handplacement to assist with seated balance.  Pt able to sit for 5" independently prior posterior lean, increased A required with time due to fatigue.  Pt left in bed with call bell within reach and bed alarm set.      Follow Up Recommendations  SNF     Equipment Recommendations  None recommended by PT    Recommendations for Other Services       Precautions / Restrictions Precautions Precautions: Fall Precaution Comments: contact precautions (e. coli ESBL) Restrictions Weight Bearing Restrictions: No    Mobility  Bed Mobility Overal bed mobility: Needs Assistance Bed Mobility: Supine to Sit;Sit to Supine     Supine to sit: Max assist;+2 for physical assistance;HOB elevated Sit to supine: Max assist;+2 for physical assistance   General bed mobility comments: Max assistance, improved assistance reaching for arm rest with Lt UE  Transfers                 General transfer comment: n/a - did not attempt due to safety concerns related to deficits in bed mobility and sitting balance  Ambulation/Gait                 Stairs             Wheelchair Mobility    Modified Rankin (Stroke Patients Only)       Balance Overall balance assessment: Needs assistance Sitting-balance support: Feet supported;Bilateral upper extremity supported Sitting balance-Leahy Scale: Poor Sitting balance - Comments: Improved sitting balalnce on EOB- min/mod A to reduce posterior lean, verbal and tactile cueing to sit up tall; pt able to sit mod I for 5" prior posterior lean today Postural control: Posterior lean                                  Cognition Arousal/Alertness: Awake/alert Behavior During Therapy: WFL for tasks assessed/performed Overall Cognitive Status: Within Functional Limits for tasks assessed                                 General Comments: global aphasia since CVA in January 2019      Exercises      General Comments        Pertinent Vitals/Pain Pain Assessment: No/denies pain    Home Living                      Prior Function            PT Goals (current goals can now be found  in the care plan section)      Frequency    Min 4X/week      PT Plan Current plan remains appropriate    Co-evaluation              AM-PAC PT "6 Clicks" Daily Activity  Outcome Measure  Difficulty turning over in bed (including adjusting bedclothes, sheets and blankets)?: Unable Difficulty moving from lying on back to sitting on the side of the bed? : Unable Difficulty sitting down on and standing up from a chair with arms (e.g., wheelchair, bedside commode, etc,.)?: Unable Help needed moving to and from a bed to chair (including a wheelchair)?: Total Help needed walking in hospital room?: Total Help needed climbing 3-5 steps with a railing? : Total 6 Click Score: 6    End of Session Equipment Utilized During Treatment: Oxygen Activity Tolerance: Patient tolerated treatment well;Patient limited by fatigue Patient left: in bed;with call bell/phone within  reach;with bed alarm set(with prevalon boot on Rt LE and pillow under calf/heels for pressure relief) Nurse Communication: Mobility status;Need for lift equipment PT Visit Diagnosis: Muscle weakness (generalized) (M62.81);Hemiplegia and hemiparesis Hemiplegia - Right/Left: Right Hemiplegia - dominant/non-dominant: Dominant Hemiplegia - caused by: Cerebral infarction     Time: 1312-1330 PT Time Calculation (min) (ACUTE ONLY): 18 min  Charges:  $Therapeutic Activity: 8-22 mins                    G Codes:       Ihor Austin, LPTA; CBIS [phone]   Sandra [name] 10/28/2017, 1:42 PM

## 2017-10-28 NOTE — NC FL2 (Signed)
[place] MEDICAID FL2 LEVEL OF CARE SCREENING TOOL     IDENTIFICATION  Patient Name: Sandra [name] Birthdate: 1940-11-14 Sex: female Admission Date (Current Location): 10/12/2017  [facility] and [place] Number:  [employer] and Address:  [place] [address] [place]      Provider Number: [id number]  Attending Physician Name and Address:  Barton Dubois, MD  Relative Name and Phone Number:  Chen Holzman [phone]    Current Level of Care: Hospital Recommended Level of Care: [place] Prior Approval Number:    Date Approved/Denied:   PASRR Number:    Discharge Plan: SNF    Current Diagnoses: Patient Active Problem List   Diagnosis Date Noted  . Pressure injury of skin 10/19/2017  . Palliative care by specialist   . Goals of care, counseling/discussion   . DNR (do not resuscitate) discussion   . Colitis 10/13/2017  . Acute renal failure ([place]) 10/13/2017  . Obstipation 10/13/2017  . Resting tremor 10/13/2017  . Labile blood glucose   . Urinary retention   . Global aphasia   . Hemiparesthesia   . 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 Acute ischemic stroke ([place])   . Diabetes mellitus type 2 in obese ([place])   . Benign essential HTN   . Noncompliance   . FUO (fever of unknown origin)   . Leukocytosis   . Dysphagia, post-stroke   . TIA (transient ischemic attack) 06/15/2017  . Hyperlipidemia associated with type 2 diabetes mellitus ([place]) 06/14/2017  . Type 2 diabetes mellitus with neurological complications ([place]) 43/32/9518  . Stroke ([place]) 03/31/2017  . History of adenomatous polyp of colon 02/11/2017  . Essential hypertension 08/12/2016  . Hemorrhoids 06/16/2016  . Constipation 06/16/2016    Orientation RESPIRATION BLADDER Height & Weight     Self  Normal Continent Weight: 188 lb 15 oz (85.7 kg) Height:  5\' 3"  (160 cm)  BEHAVIORAL SYMPTOMS/MOOD  NEUROLOGICAL BOWEL NUTRITION STATUS      Continent Diet(see dc summary)  AMBULATORY STATUS COMMUNICATION OF NEEDS Skin   Extensive Assist Non-Verbally Normal                       Personal Care Assistance Level of Assistance  Bathing, Feeding, Dressing Bathing Assistance: Maximum assistance Feeding assistance: Maximum assistance Dressing Assistance: Maximum assistance     Functional Limitations Info  Sight, Hearing, Speech Sight Info: Adequate Hearing Info: Adequate Speech Info: Impaired(dysphagia, aphasia s/p CVA)    SPECIAL CARE FACTORS FREQUENCY  PT (By licensed PT), OT (By licensed OT)     PT Frequency: 5 times week OT Frequency: 5 times week            Contractures Contractures Info: Not present    Additional Factors Info  Psychotropic Code Status Info: full Allergies Info: Hctz, Morphine and related, Valium Psychotropic Info: Celexa         Current Medications (10/28/2017):  This is the current hospital active medication list Current Facility-Administered Medications  Medication Dose Route Frequency Provider Last Rate Last Dose  . 0.45 % sodium chloride infusion   Intravenous Continuous Fran Lowes, MD 50 mL/hr at 10/26/17 0508    . acetaminophen (TYLENOL) tablet 325-650 mg  325-650 mg Oral Q4H PRN Oswald Hillock, MD   650 mg at 10/22/17 1708  . atorvastatin (LIPITOR) tablet 40 mg  40 mg Oral q1800 Oswald Hillock, MD  40 mg at 10/27/17 1612  . bethanechol (URECHOLINE) tablet 25 mg  25 mg Oral TID Oswald Hillock, MD   25 mg at 10/27/17 2303  . citalopram (CELEXA) tablet 20 mg  20 mg Oral Daily Manuella Ghazi, Pratik D, DO   20 mg at 10/27/17 0907  . clopidogrel (PLAVIX) tablet 75 mg  75 mg Oral Q breakfast Oswald Hillock, MD   75 mg at 10/27/17 0739  . enoxaparin (LOVENOX) injection 40 mg  40 mg Subcutaneous Q24H Kathie Dike, MD   40 mg at 10/27/17 1613  . feeding supplement (PRO-STAT SUGAR FREE 64) liquid 30 mL  30 mL Oral BID Barton Dubois, MD   30 mL at  10/27/17 2304  . insulin aspart (novoLOG) injection 0-15 Units  0-15 Units Subcutaneous TID WC Shah, Pratik D, DO   5 Units at 10/28/17 1235  . insulin aspart (novoLOG) injection 0-5 Units  0-5 Units Subcutaneous QHS Manuella Ghazi, Pratik D, DO      . levETIRAcetam (KEPPRA) 100 MG/ML solution 500 mg  500 mg Oral BID Manuella Ghazi, Pratik D, DO   500 mg at 10/27/17 2303  . MEDLINE mouth rinse  15 mL Mouth Rinse BID Kathie Dike, MD   15 mL at 10/27/17 2304  . metoprolol tartrate (LOPRESSOR) tablet 25 mg  25 mg Oral BID Heath Lark D, DO   25 mg at 10/27/17 2303  . multivitamin liquid 15 mL  15 mL Oral Daily Manuella Ghazi, Pratik D, DO   15 mL at 10/27/17 0907  . ondansetron (ZOFRAN) tablet 4 mg  4 mg Oral Q6H PRN Oswald Hillock, MD   4 mg at 10/18/17 2103   Or  . ondansetron (ZOFRAN) injection 4 mg  4 mg Intravenous Q6H PRN Oswald Hillock, MD   4 mg at 10/23/17 1806  . tamsulosin (FLOMAX) capsule 0.4 mg  0.4 mg Oral QPC supper Oswald Hillock, MD   0.4 mg at 10/27/17 1612     Discharge Medications: Please see discharge summary for a list of discharge medications.  Relevant Imaging Results:  Relevant Lab Results:   Additional Information SSN: [id number] Clydene Pugh, LCSW

## 2017-10-28 NOTE — Progress Notes (Signed)
  Speech Language Pathology Treatment: Dysphagia  Patient Details Name: Sandra [name] MRN: [id number] DOB: 1940-09-26 Today's Date: 10/28/2017 Time: 2330-0762 SLP Time Calculation (min) (ACUTE ONLY): 26 min  Assessment / Plan / Recommendation Clinical Impression  Dysphagia treatment provided to check for diet tolerance/education. SLP provided oral care. Pt alert upon arrival and groaning while touching left leg and pulling cath tube.  Pt reached for SLP's hand, then touched leg while groaning.  SLP notified nursing.  Nursing administered pain med during session. Pt tolerated puree/ thin liquid consistencies, including meds with RN; pt demonstrated prolonged bolus formation but no overt s/sx of aspiration noted. Pt nonverbally refused PO after 4 bites/sips.  Recommend continuing dysphagia 1 diet, thin liquids, meds crushed in puree, full supervision/ assistance for feeding. Will continue to follow for diet tolerance/ continued education. Pt left upright in bed with nursing in room.  Requested Pt remain upright for 30 minutes given PO trials administered.  Recommend small, frequent comfort feedings due to tiring while eating.      HPI HPI: 77 year old female admitted to the hospital from skilled nursing facility with abdominal pain. She was found to have colitis with possible fecal impaction on admission. She has thus far completed a full course of IV ciprofloxacin and Flagyl. 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 Neurology following. She also had worsening renal function which is now improved and was followed by Nephrology at one point. Mental status remains stable, but oral intake continues to remain poor. She has developed some worsening diarrhea over the last 2 days for which GI panel is currently pending. Palliative care assisting in discussions with family members and patient is now DNR. Would benefit of hospice, comfort care and  symptoms management only. Renal service and GI sign off.       SLP Plan  Continue with current plan of care       Recommendations  Diet recommendations: Dysphagia 1 (puree);Thin liquid Medication Administration: Crushed with puree Supervision: Staff to assist with self feeding;Full supervision/cueing for compensatory strategies Compensations: Minimize environmental distractions;Slow rate;Small sips/bites;Follow solids with liquid;Other (Comment)(Ensure Pt has swallowed prior to giving another bite) Postural Changes and/or Swallow Maneuvers: Seated upright 90 degrees;Upright 30-60 min after meal                Oral Care Recommendations: Oral care BID Follow up Recommendations: Other (comment)(Palliative care assisting in discussions with family members) SLP Visit Diagnosis: Dysphagia, unspecified (R13.10) Plan: Continue with current plan of care       GO              Joneen Boers  M.A., CCC-SLP [email or web address] Nathanel Tallman 10/28/2017, 3:14 PM

## 2017-10-28 NOTE — Care Management Important Message (Signed)
Important Message  Patient Details  Name: Sandra [name] MRN: [id number] Date of Birth: Sep 09, 1940   Medicare Important Message Given:  Yes    Sherald Barge, RN 10/28/2017, 2:03 PM

## 2017-10-28 NOTE — Progress Notes (Signed)
PROGRESS NOTE    Sandra [name]  KGU:[id number] DOB: 1940-12-02 DOA: 10/12/2017 PCP: Caren Macadam, MD    Brief Narrative:  77 year old female admitted to the hospital from skilled nursing facility with abdominal pain. She was found to have colitis with possible fecal impaction on admission.  She has thus far completed a full course of IV ciprofloxacin and Flagyl.  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 Neurology following. She also had worsening renal function which is now improved and was followed by Nephrology at one point.  Mental status remains stable, but oral intake continues to remain poor.  She has developed some worsening diarrhea over the last 2 days for which GI panel is currently pending.  Palliative care assisting in discussions with family members and patient is now DNR.  Would benefit of hospice, comfort care and symptoms management only. Renal service and GI sign off.   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 Colitis.  Improved with Flagyl and Cipro DC after 7 day course of treatment on 6/6. Now with recurrence of diarrhea for which GI panel was ordered and negative.  Continue to follow bowel movements habits. 2. Acute renal failure-resolved with IVF's. With hypernatremia. Nephrology has recommended some IV fluid with half-normal saline; after improvement, they have sign off. Follow renal function and electrolytes  intermittently. Last sodium level 150- and trending down.   3. Vtach. Patient now DNR.  Rate has remained stable and controlled.  Telemetry discontinued 10/26/2017. 4. Partial seizures-resolved.  Resolved after Keppra. Continue Keppra and continue to monitor for any seizure activity.  5. Acute on chronic encephalopathy, likely metabolic.  Overall prognosis is poor. ABG and ammonia were unremarkable.  Possibly related to dehydration/urinary tract infection.  At baseline she has aphasia which makes communication difficult. Patient DNR now. 6. Diabetes. Continue sliding scale coverage. BG ok while on D5 drip. Continue to follow CBG. 7. Previous stroke.  She has residual right-sided hemiparesis and global aphasia.  Continue on Plavix.  MRI performed during this hospitalization was poor quality; but no major acute changes appreciated. 8. Possible fecal impaction.  Resolved with enemas and digital disimpaction. Will follow BM's. 9. UTI-resolved. Urinalysis indicates pyuria.  Urine culture showed no growth. No dysuria, fever or abnormal WBC's. 10. Hypokalemia-mild.  Resolved. Continue potassium supplementation in IVF. 11. Severe protein calorie malnutrition.  P.o. intake remains poor.  Currently not looking to pursuit NGT or PEG placement. Continue current diet.  12. Depression. Continue celexa.   DVT prophylaxis: Lovenox Code Status: DNR Family Communication: Son at bedside  Disposition Plan: Overall with poor oral intake.  After long discussion with family members at bedside decision has been made to transfer on 6/15 back to facility for attempted rehabilitation with the intention of transition to comfort care and symptomatic management issues failed to further improve.  No artificial interventions will be provided and the patient remains a  DO NOT RESUSCITATE/DO NOT INTUBATE.    Consultants:   Neurology  Nephrology  Gastroenterology  Procedures:  EEG:1. Frontal intermittent rhythmic delta  activities are observed. This is typically seen in toxic metabolic encephalopathies.  2. Triphasic waves are also observed typically seen in metabolic encephalopathies particularly due to hepatic failure or renal failure.  Antimicrobials:   Ciprofloxacin 5/30 >6/6  Flagyl 5/30>6/6   Subjective: No fever, no chest pain, no shortness of breath.  No further diarrhea reported.  Overall intake continues to be poor.  Patient in no acute distress.  Objective: Vitals:   10/27/17 2157 10/27/17 2303 10/28/17 0637 10/28/17 1400  BP: (!) 130/52 (!) 130/52 124/74 (!) 127/55  Pulse: 94 94 84 90  Resp:      Temp: 98.6 F (37 C)  98.7 F (37.1 C) 97.8 F (36.6 C)  TempSrc: Oral  Oral Oral  SpO2: 93%  93% 99%  Weight:      Height:        Intake/Output Summary (Last 24 hours) at 10/28/2017 1653 Last data filed at 10/28/2017 0636 Gross per 24 hour  Intake 500 ml  Output 650 ml  Net -150 ml   Filed Weights   10/17/17 0500 10/18/17 0400 10/20/17 0500  Weight: 80.6 kg (177 lb 11.1 oz) 81.7 kg (180 lb 1.9 oz) 85.7 kg (188 lb 15 oz)    Examination:  General exam: Alert, awake, nonverbal, afebrile and in no distress.  Oral intake continues to be poor overall.  No further diarrhea reported. Respiratory system: Clear to auscultation. Respiratory effort normal. Cardiovascular system:RRR. No murmurs, rubs, gallops. Gastrointestinal system: Abdomen is nondistended, soft and nontender. No organomegaly or masses felt. Normal bowel sounds heard. Central nervous system: Alert and oriented. No new focal neurological deficits. Residual right hemiparesis and aphasia unchanged.  Extremities: No cyanosis or clubbing; trace to 1+ edema bilaterally (mainly seen on her lower extremities). Skin: No rashes, lesions or ulcers Psychiatry: No agitation, patient is calm, able to track movement and activities inside the wound and by her expression in front of family members, able to recognize them.   Data  Reviewed: I have personally reviewed following labs and imaging studies  CBC: Recent Labs  Lab 10/24/17 0414 10/25/17 0719 10/26/17 0618  WBC 7.2 5.6 5.2  HGB 9.3* 10.1* 9.3*  HCT 28.2* 31.0* 28.5*  MCV 81.3 80.9 81.7  PLT 580* 613* 616*   Basic Metabolic Panel: Recent Labs  Lab 10/22/17 0805 10/24/17 0414 10/25/17 0719 10/26/17 0618 10/27/17 0449  NA 147* 152* 150* 151*  --   K 3.4* 3.7 3.9 3.7  --   CL 119* 124* 121* 121*  --   CO2 23 24 24 24   --   GLUCOSE 202* 116* 137* 274*  --   BUN 38* 41* 41* 42*  --   CREATININE 0.60 0.57 0.63 0.65 0.60  CALCIUM 7.7* 7.5* 7.5* 7.3*  --   MG 1.9  --  1.9  --   --    GFR: Estimated Creatinine Clearance: 60.1 mL/min (by C-G formula based on SCr of 0.6 mg/dL).   CBG: Recent Labs  Lab 10/27/17 1609 10/27/17 2157 10/28/17 0825 10/28/17 1148 10/28/17 1619  GLUCAP 202* 167* 202* 250* 163*    Recent Results (from the past 240 hour(s))  Gastrointestinal Panel by PCR , Stool     Status: None   Collection Time: 10/24/17  8:53 AM  Result Value Ref Range Status   Campylobacter species NOT DETECTED NOT DETECTED  Final   Plesimonas shigelloides NOT DETECTED NOT DETECTED Final   Salmonella species NOT DETECTED NOT DETECTED Final   Yersinia enterocolitica NOT DETECTED NOT DETECTED Final   Vibrio species NOT DETECTED NOT DETECTED Final   Vibrio cholerae NOT DETECTED NOT DETECTED Final   Enteroaggregative E coli (EAEC) NOT DETECTED NOT DETECTED Final   Enteropathogenic E coli (EPEC) NOT DETECTED NOT DETECTED Final   Enterotoxigenic E coli (ETEC) NOT DETECTED NOT DETECTED Final   Shiga like toxin producing E coli (STEC) NOT DETECTED NOT DETECTED Final   Shigella/Enteroinvasive E coli (EIEC) NOT DETECTED NOT DETECTED Final   Cryptosporidium NOT DETECTED NOT DETECTED Final   Cyclospora cayetanensis NOT DETECTED NOT DETECTED Final   Entamoeba histolytica NOT DETECTED NOT DETECTED Final   Giardia lamblia NOT DETECTED NOT DETECTED Final     Adenovirus F40/41 NOT DETECTED NOT DETECTED Final   Astrovirus NOT DETECTED NOT DETECTED Final   Norovirus GI/GII NOT DETECTED NOT DETECTED Final   Rotavirus A NOT DETECTED NOT DETECTED Final   Sapovirus (I, II, IV, and V) NOT DETECTED NOT DETECTED Final    Comment: Performed at [facility], [address] [place], [place] [address]     Scheduled Meds: . atorvastatin  40 mg Oral q1800  . bethanechol  25 mg Oral TID  . citalopram  20 mg Oral Daily  . clopidogrel  75 mg Oral Q breakfast  . enoxaparin (LOVENOX) injection  40 mg Subcutaneous Q24H  . feeding supplement (PRO-STAT SUGAR FREE 64)  30 mL Oral BID  . insulin aspart  0-15 Units Subcutaneous TID WC  . insulin aspart  0-5 Units Subcutaneous QHS  . levETIRAcetam  500 mg Oral BID  . mouth rinse  15 mL Mouth Rinse BID  . metoprolol tartrate  25 mg Oral BID  . multivitamin  15 mL Oral Daily  . tamsulosin  0.4 mg Oral QPC supper   Continuous Infusions: . sodium chloride 50 mL/hr at 10/26/17 0508     LOS: 15 days    Time spent: 30 minutes    Barton Dubois, MD [facility] Pager [phone]  If 7PM-7AM, please contact night-coverage [email or web address] Password [facility] 10/28/2017, 4:53 PM

## 2017-10-29 DIAGNOSIS — R4701 Aphasia: Secondary | ICD-10-CM

## 2017-10-29 DIAGNOSIS — E1169 Type 2 diabetes mellitus with other specified complication: Secondary | ICD-10-CM

## 2017-10-29 DIAGNOSIS — E43 Unspecified severe protein-calorie malnutrition: Secondary | ICD-10-CM

## 2017-10-29 DIAGNOSIS — N179 Acute kidney failure, unspecified: Secondary | ICD-10-CM

## 2017-10-29 DIAGNOSIS — E1149 Type 2 diabetes mellitus with other diabetic neurological complication: Secondary | ICD-10-CM

## 2017-10-29 DIAGNOSIS — E785 Hyperlipidemia, unspecified: Secondary | ICD-10-CM

## 2017-10-29 DIAGNOSIS — Z7189 Other specified counseling: Secondary | ICD-10-CM

## 2017-10-29 LAB — GLUCOSE, CAPILLARY
GLUCOSE-CAPILLARY: 134 mg/dL — AB (ref 65–99)
Glucose-Capillary: 125 mg/dL — ABNORMAL HIGH (ref 65–99)
Glucose-Capillary: 127 mg/dL — ABNORMAL HIGH (ref 65–99)

## 2017-10-29 MED ORDER — METOPROLOL TARTRATE 25 MG PO TABS
25.0000 mg | ORAL_TABLET | Freq: Two times a day (BID) | ORAL | Status: AC
Start: 2017-10-29 — End: ?

## 2017-10-29 MED ORDER — LEVETIRACETAM 100 MG/ML PO SOLN: 500.0000 mg | Freq: Two times a day (BID) | ORAL | Status: DC

## 2017-10-29 MED ORDER — PRO-STAT SUGAR FREE PO LIQD: 30.0000 mL | Freq: Two times a day (BID) | ORAL | Status: AC

## 2017-10-29 MED ORDER — INSULIN GLARGINE 100 UNIT/ML ~~LOC~~ SOLN: 15.0000 [IU] | Freq: Every day | SUBCUTANEOUS | Status: AC

## 2017-10-29 MED ORDER — CITALOPRAM HYDROBROMIDE 20 MG PO TABS: 20.0000 mg | ORAL_TABLET | Freq: Every day | ORAL | Status: AC

## 2017-10-29 NOTE — Progress Notes (Signed)
Patient will Discharge To: [facility] Anticipated DC Date:10/29/17 Family Notified:yes, Man Bonneau, [phone] Transport By: [facility]   Per MD patient ready for DC to [facility]. RN, patient, patient's family, and facility notified of DC. Assessment, Fl2/Pasrr, and Discharge Summary sent to facility. RN given number for report [phone], Hall [address] bed 2). DC packet on chart. Ambulance transport requested for patient.   CSW signing off.  Reed Breech LCSWA [phone]

## 2017-10-29 NOTE — Progress Notes (Signed)
Report called to nursing staff at [facility]. EMS called for transport of patient. Family notified of patient discharge. Midline catheter removed, WNL. AVS along with other information printed and ready to send with patient on transport.

## 2017-10-29 NOTE — Discharge Summary (Signed)
Physician Discharge Summary  Tommi Crepeau PIR:[id number] DOB: December 01, 1940 DOA: 10/12/2017  PCP: Caren Macadam, MD  Admit date: 10/12/2017 Discharge date: 10/29/2017  Time spent: 35 minutes  Recommendations for Outpatient Follow-up:  1. Repeat BMET to follow electrolytes and renal function  2. 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 Will discharge back to SNF to pursuit further care and rehabilitation. Overall prognosis is guarded, and family looking to make transition to comfort care and symptoms management if she failed to improved/progress with rehab.   Diet recommendation: heart healthy and modified carb. Dysphagia 1 with thin liquids.   Filed Weights   10/17/17 0500 10/18/17 0400 10/20/17 0500  Weight: 80.6 kg (177 lb 11.1 oz) 81.7 kg (180 lb 1.9 oz) 85.7 kg (188 lb 15 oz)    History of present illness:  As per H&P written by Dr. Darrick Meigs on 10/13/2017 77 y.o. female,with a history of prior CVA with global aphasia, diabetes mellitus, hypertension, peripheral artery disease was sent from skilled care facility for evaluation of vomiting.  There was concern for small bowel obstruction.  In the ED CT scan of the abdomen and pelvis showed sigmoid colitis. Patient started on ciprofloxacin and Flagyl. Patient  has global  aphasia, nonverbal, unable to provide any history.  Hospital Course:  1. Colitis.  Improved with Flagyl and Cipro DC after 7 day course of treatment on 6/6. Now with recurrence of diarrhea for which GI panel was ordered and negative. No diarrhea at discharge; stool formed. 2. Acute renal failure-resolved with IVF's. With hypernatremia. Appreciate nephrology rec's. Improvement seen with IVF's. Patient's family instructed to assist to maintain adequate hydration.   3. Vtach. Patient now DNR.  Rate has remained stable and controlled. Will continue metoprolol. 4. Partial seizures-resolved.  Resolved after Keppra. Continue Keppra; neurontin discontinued.  5. Acute on chronic encephalopathy, likely metabolic.  Overall prognosis is poor. ABG and ammonia were unremarkable.  Possibly related to dehydration/urinary tract infection and electrolytes impairment.  At baseline she has aphasia which makes communication difficult. Patient DNR now. 6. Diabetes. Will discharge on lantus 15 units QHS. Follow CBG's  7. Previous stroke.  She has residual right-sided hemiparesis and global aphasia.  Continue on ASA and Plavix.  MRI performed during this hospitalization was poor quality; but no major acute changes appreciated. Continue rehabilitation. 8. Possible fecal impaction.  Resolved with enemas and digital disimpaction. Will follow BM's and continue to use senokot and PRN dulcolax.. 9. UTI-resolved. Urinalysis indicates pyuria.  Urine culture showed no growth. No dysuria, fever or elevated WBC's. 10. Hypokalemia-mild.  Resolved. Follow electrolytes trend and replete as needed. 11. Severe protein calorie malnutrition.  P.o. intake remains poor; even with slight improvement. After discussing with family, not looking to pursuit NGT or PEG placement. Continue current diet and continue prostat.  12. Depression.  Will continue celexa.   Procedures: EEG:1. Frontal intermittent rhythmic delta activities are observed. This  is typically seen in toxic metabolic encephalopathies.  2. Triphasic waves are also observed typically seen in metabolic encephalopathies particularly due to hepatic failure or renal failure.  Consultations:  Neurology  GI  Nephrology  Palliative Care  Discharge Exam: Vitals:   10/28/17 2201 10/29/17 0609  BP: 129/68 (!) 142/81  Pulse: 85 (!) 121  Resp: 19 19  Temp: 98.7 F (37.1 C) (!) 97.4 F (36.3 C)  SpO2: 94% 96%    General exam: Alert, awake, nonverbal, afebrile and in no distress.  Oral intake continues to be poor; even there has been some improvement in her intake in the last 2 days. No further diarrhea reported. Respiratory system: Clear to auscultation. Respiratory effort normal. Cardiovascular system:RRR. No murmurs, rubs, gallops. Gastrointestinal system: Abdomen is nondistended, soft and nontender. No organomegaly or masses felt. Normal bowel sounds heard. Central nervous system: Alert and oriented. No new focal neurological deficits. Residual right hemiparesis and aphasia unchanged. moving left side spontaneously. Extremities: No cyanosis or clubbing; trace to 1+ edema bilaterally (mainly seen on her lower extremities). Skin: No rashes, lesions or ulcers Psychiatry: No agitation, patient is calm, able to track movement and activities inside the wound and by her expression in front of family members, able to recognize them.   Discharge Instructions   Discharge Instructions    Diet Carb Modified   Complete by:  As directed    Discharge instructions   Complete by:  As directed    Dysphagia 1 diet with thin liquids Physical therapy and supportive care as per SNF protocol If patient failed to improved; plan is to transition care to comfort care and symptomatic management at that moment. Please assure patient is receiving adequate nutrition and hydration; she needs to be fed.     Allergies as of 10/29/2017      Reactions   Hctz [hydrochlorothiazide] Other  (See Comments)   Caused pancreatitis flare up   Morphine And Related Other (See Comments)   dizziness   Valium [diazepam] Other (See Comments)   Confusion, blurred vision      Medication List    STOP taking these medications   amLODipine 10 MG tablet Commonly known as:  NORVASC   bethanechol 25 MG tablet Commonly known as:  URECHOLINE   gabapentin 100 MG capsule Commonly known as:  NEURONTIN   metFORMIN 500 MG tablet Commonly known as:  GLUCOPHAGE   traMADol 50 MG tablet Commonly known as:  ULTRAM     TAKE these medications   acetaminophen 325 MG tablet Commonly known as:  TYLENOL Take 1-2 tablets (325-650 mg total) by mouth every 4 (four) hours as needed for mild pain.   aspirin 325 MG EC tablet Take 1 tablet (325 mg total) by mouth daily.   atorvastatin 40 MG tablet Commonly known as:  LIPITOR Take 1 tablet (40 mg total) by mouth daily at 6 PM.   bisacodyl 10 MG suppository Commonly known as:  DULCOLAX Place 10 mg rectally once as needed for moderate constipation.   citalopram 20 MG tablet Commonly known as:  CELEXA Take 1 tablet (20 mg total) by mouth daily. Start taking on:  10/30/2017 What changed:    medication strength  how much to take   clopidogrel 75 MG tablet Commonly known as:  PLAVIX Take 1 tablet (75 mg total) by mouth daily with breakfast.   collagenase ointment Commonly known as:  SANTYL  Apply 1 application topically daily.   feeding supplement (PRO-STAT SUGAR FREE 64) Liqd Take 30 mLs by mouth 2 (two) times daily.   insulin glargine 100 UNIT/ML injection Commonly known as:  LANTUS Inject 0.15 mLs (15 Units total) into the skin daily. What changed:  how much to take   levETIRAcetam 100 MG/ML solution Commonly known as:  KEPPRA Take 5 mLs (500 mg total) by mouth 2 (two) times daily.   metoprolol tartrate 25 MG tablet Commonly known as:  LOPRESSOR Take 1 tablet (25 mg total) by mouth 2 (two) times daily.   ONE-A-DAY 50 PLUS  PO Take 1 tablet by mouth daily.   senna-docusate 8.6-50 MG tablet Commonly known as:  Senokot-S Take 2 tablets by mouth at bedtime. What changed:  when to take this   tamsulosin 0.4 MG Caps capsule Commonly known as:  FLOMAX Take 1 capsule (0.4 mg total) by mouth daily after supper.   vitamin B-12 1000 MCG tablet Commonly known as:  CYANOCOBALAMIN Take 1,000 mcg by mouth daily.      Allergies  Allergen Reactions  . Hctz [Hydrochlorothiazide] Other (See Comments)    Caused pancreatitis flare up  . Morphine And Related Other (See Comments)    dizziness  . Valium [Diazepam] Other (See Comments)    Confusion, blurred vision   Follow-up Information    Caren Macadam, MD. Schedule an appointment as soon as possible for a visit in 14 day(s).   Specialty:  Family Medicine Contact information: [address] [place] [address] [phone]           The results of significant diagnostics from this hospitalization (including imaging, microbiology, ancillary and laboratory) are listed below for reference.    Significant Diagnostic Studies: Mr Virgel Paling OZ Contrast  Result Date: 10/13/2017 CLINICAL DATA:  Altered mental status over the last 2 days. Left MCA stroke. EXAM: MRI HEAD WITHOUT CONTRAST MRA HEAD WITHOUT CONTRAST TECHNIQUE: Multiplanar, multiecho pulse sequences of the brain and surrounding structures were obtained without intravenous contrast. Angiographic images of the head were obtained using MRA technique without contrast. COMPARISON:  CT 06/17/2017.  MRI 06/16/2017. FINDINGS: MRI HEAD FINDINGS Brain: Diffusion imaging shows evidence of the old left MCA territory infarction. Superior and medial to the region of old infarction, there is an area of questionable restricted diffusion that could represent extension along the posterior margin. This signal does not show strong hypointensity on the ADC maps in therefore is not absolutely certain to represent a recent  change in could be related to the previous infarction. There is cerebellar atrophy. No focal brainstem finding. Cerebral hemispheres elsewhere show chronic small-vessel ischemic changes of the white matter. No other large vessel territory infarction. There is some residual hemosiderin deposition in the region of old infarction. No suspicion of acute hemorrhage. No hydrocephalus or extra-axial collection. Vascular: Major vessels at the base of the brain show flow. Skull and upper cervical spine: Negative Sinuses/Orbits: Clear/normal Other: None MRA HEAD FINDINGS Severely motion degraded exam. Both internal carotid arteries are patent through the skull base and siphon regions. There is flow in both anterior cerebral arteries and in the right middle cerebral artery. Distal left MCA branch vessels do not show flow. Both vertebral arteries show flow to the basilar. The basilar is patent without likely stenosis. Posterior circulation branch vessels show flow, but detail is quite limited. IMPRESSION: Severely motion degraded MR angiography. Diminished flow in the distal left MCA branches. Old left MCA territory infarction with  atrophy, encephalomalacia and gliosis. Question acute extension along the posteromedial margin of the infarction at the high left parietal region. This is not definite, as there does not appear to be marked signal reduction on the ADC map. It is possible this signal pattern could relate to the old infarction/T2 shine through. Electronically Signed   By: Nelson Chimes M.D.   On: 10/13/2017 13:56   Mr Brain Wo Contrast  Result Date: 10/13/2017 CLINICAL DATA:  Altered mental status over the last 2 days. Left MCA stroke. EXAM: MRI HEAD WITHOUT CONTRAST MRA HEAD WITHOUT CONTRAST TECHNIQUE: Multiplanar, multiecho pulse sequences of the brain and surrounding structures were obtained without intravenous contrast. Angiographic images of the head were obtained using MRA technique without contrast.  COMPARISON:  CT 06/17/2017.  MRI 06/16/2017. FINDINGS: MRI HEAD FINDINGS Brain: Diffusion imaging shows evidence of the old left MCA territory infarction. Superior and medial to the region of old infarction, there is an area of questionable restricted diffusion that could represent extension along the posterior margin. This signal does not show strong hypointensity on the ADC maps in therefore is not absolutely certain to represent a recent change in could be related to the previous infarction. There is cerebellar atrophy. No focal brainstem finding. Cerebral hemispheres elsewhere show chronic small-vessel ischemic changes of the white matter. No other large vessel territory infarction. There is some residual hemosiderin deposition in the region of old infarction. No suspicion of acute hemorrhage. No hydrocephalus or extra-axial collection. Vascular: Major vessels at the base of the brain show flow. Skull and upper cervical spine: Negative Sinuses/Orbits: Clear/normal Other: None MRA HEAD FINDINGS Severely motion degraded exam. Both internal carotid arteries are patent through the skull base and siphon regions. There is flow in both anterior cerebral arteries and in the right middle cerebral artery. Distal left MCA branch vessels do not show flow. Both vertebral arteries show flow to the basilar. The basilar is patent without likely stenosis. Posterior circulation branch vessels show flow, but detail is quite limited. IMPRESSION: Severely motion degraded MR angiography. Diminished flow in the distal left MCA branches. Old left MCA territory infarction with atrophy, encephalomalacia and gliosis. Question acute extension along the posteromedial margin of the infarction at the high left parietal region. This is not definite, as there does not appear to be marked signal reduction on the ADC map. It is possible this signal pattern could relate to the old infarction/T2 shine through. Electronically Signed   By: Nelson Chimes M.D.   On: 10/13/2017 13:56   Ct Abdomen Pelvis W Contrast  Result Date: 10/13/2017 CLINICAL DATA:  Vomiting EXAM: CT ABDOMEN AND PELVIS WITH CONTRAST TECHNIQUE: Multidetector CT imaging of the abdomen and pelvis was performed using the standard protocol following bolus administration of intravenous contrast. CONTRAST:  16mL ISOVUE-300 IOPAMIDOL (ISOVUE-300) INJECTION 61% COMPARISON:  05/29/2016 FINDINGS: Lower chest: Lung bases are essentially clear. Hepatobiliary: Liver is notable for suspected focal steatosis superior to the gallbladder fossa (coronal image 38). Layering gallbladder sludge (series 2/image 19). No inflammatory changes. No intrahepatic or extrahepatic ductal dilatation. Pancreas: Within normal limits. Spleen: Within normal limits. Adrenals/Urinary Tract: Adrenal glands are within normal limits. Right lower pole renal cortical scarring. 4 mm nonobstructing right upper pole renal calculus (series 2/image 30). Parenchymal calcification in the right lower kidney. Left renal cysts, measuring up to 5.6 cm in the posterior left upper kidney. No hydronephrosis. Bladder is within normal limits. Stomach/Bowel: Stomach is within normal limits. No evidence of bowel obstruction. Appendix is not  discretely visualized. Moderate rectal stool burden. Mild wall thickening involving the sigmoid colon with mild pericolonic inflammatory changes (series 2/image 68), suggesting colitis. No pneumatosis or free air. Vascular/Lymphatic: No evidence of abdominal aortic aneurysm. Atherosclerotic calcifications of the abdominal aorta and branch vessels. No suspicious abdominopelvic lymphadenopathy. Reproductive: Status post hysterectomy. No adnexal masses. Other: No abdominopelvic ascites. Musculoskeletal: Degenerative changes of the visualized thoracolumbar spine. IMPRESSION: Wall thickening/inflammatory changes involving the sigmoid colon, suggesting colitis. No pneumatosis or free air. Moderate rectal stool  burden, suggesting fecal impaction. No evidence of bowel obstruction. Additional ancillary findings as above. Electronically Signed   By: Julian Hy M.D.   On: 10/13/2017 02:36   US Renal  Result Date: 10/15/2017 CLINICAL DATA:  Acute renal failure. EXAM: RENAL / URINARY TRACT ULTRASOUND COMPLETE COMPARISON:  CT 10/13/2017 FINDINGS: Right Kidney: Length: Small, measuring 9.2 cm in length. Cortical atrophy. Lobular parenchyma probably secondary to the atrophy. Echogenicity within normal limits. No mass or hydronephrosis visualized. Left Kidney: Length: 10.2 cm. Normal cortical thickness. Echogenicity within normal limits. No mass or hydronephrosis visualized. 5 cm cyst in the midportion. Bladder: The bladder was not visualized and is presumably collapsed. IMPRESSION: No obstruction. Atrophy of the right kidney relative to the left, with small size and lobular contours. Electronically Signed   By: Nelson Chimes M.D.   On: 10/15/2017 12:53   Dg Abd Portable 1v  Result Date: 10/15/2017 CLINICAL DATA:  Possible fecal impaction EXAM: PORTABLE ABDOMEN - 1 VIEW COMPARISON:  None. FINDINGS: Scattered large and small bowel gas is noted. No obstructive changes are seen. No constipation or fecal impaction is noted. Degenerative changes of lumbar spine are seen. No other focal abnormality is noted. IMPRESSION: No acute abnormality noted. Electronically Signed   By: Inez Catalina M.D.   On: 10/15/2017 18:41    Microbiology: Recent Results (from the past 240 hour(s))  Gastrointestinal Panel by PCR , Stool     Status: None   Collection Time: 10/24/17  8:53 AM  Result Value Ref Range Status   Campylobacter species NOT DETECTED NOT DETECTED Final   Plesimonas shigelloides NOT DETECTED NOT DETECTED Final   Salmonella species NOT DETECTED NOT DETECTED Final   Yersinia enterocolitica NOT DETECTED NOT DETECTED Final   Vibrio species NOT DETECTED NOT DETECTED Final   Vibrio cholerae NOT DETECTED NOT DETECTED Final    Enteroaggregative E coli (EAEC) NOT DETECTED NOT DETECTED Final   Enteropathogenic E coli (EPEC) NOT DETECTED NOT DETECTED Final   Enterotoxigenic E coli (ETEC) NOT DETECTED NOT DETECTED Final   Shiga like toxin producing E coli (STEC) NOT DETECTED NOT DETECTED Final   Shigella/Enteroinvasive E coli (EIEC) NOT DETECTED NOT DETECTED Final   Cryptosporidium NOT DETECTED NOT DETECTED Final   Cyclospora cayetanensis NOT DETECTED NOT DETECTED Final   Entamoeba histolytica NOT DETECTED NOT DETECTED Final   Giardia lamblia NOT DETECTED NOT DETECTED Final   Adenovirus F40/41 NOT DETECTED NOT DETECTED Final   Astrovirus NOT DETECTED NOT DETECTED Final   Norovirus GI/GII NOT DETECTED NOT DETECTED Final   Rotavirus A NOT DETECTED NOT DETECTED Final   Sapovirus (I, II, IV, and V) NOT DETECTED NOT DETECTED Final    Comment: Performed at [facility], [address] [place], [place] [address]    Labs: Basic Metabolic Panel: Recent Labs  Lab 10/24/17 0414 10/25/17 0719 10/26/17 0618 10/27/17 0449  NA 152* 150* 151*  --   K 3.7 3.9 3.7  --   CL 124* 121* 121*  --  CO2 24 24 24   --   GLUCOSE 116* 137* 274*  --   BUN 41* 41* 42*  --   CREATININE 0.57 0.63 0.65 0.60  CALCIUM 7.5* 7.5* 7.3*  --   MG  --  1.9  --   --    CBC: Recent Labs  Lab 10/24/17 0414 10/25/17 0719 10/26/17 0618  WBC 7.2 5.6 5.2  HGB 9.3* 10.1* 9.3*  HCT 28.2* 31.0* 28.5*  MCV 81.3 80.9 81.7  PLT 580* 613* 605*    CBG: Recent Labs  Lab 10/28/17 1148 10/28/17 1619 10/28/17 2202 10/29/17 0741 10/29/17 1136  GLUCAP 250* 163* 123* 125* 134*    Signed:  Barton Dubois MD.  [facility] 10/29/2017, 1:56 PM

## 2017-10-29 NOTE — Progress Notes (Signed)
Confirmed with EMS that they are going to transport patient. They stated they still are, that it'll be after shift change. Will notify night shift RN.

## 2017-10-30 NOTE — Progress Notes (Signed)
EMS on unit to transport patient to [facility].

## 2017-11-05 ENCOUNTER — Encounter (HOSPITAL_COMMUNITY): Payer: Self-pay | Admitting: Cardiology

## 2017-11-05 ENCOUNTER — Other Ambulatory Visit: Payer: Self-pay

## 2017-11-05 ENCOUNTER — Observation Stay (HOSPITAL_COMMUNITY)
Admission: EM | Admit: 2017-11-05 | Discharge: 2017-11-06 | Disposition: A | Discharge status: Home / Self Care | Payer: Medicare Other | Attending: Internal Medicine | Admitting: Internal Medicine

## 2017-11-05 DIAGNOSIS — E669 Obesity, unspecified: Secondary | ICD-10-CM

## 2017-11-05 DIAGNOSIS — Z79899 Other long term (current) drug therapy: Secondary | ICD-10-CM | POA: Diagnosis not present

## 2017-11-05 DIAGNOSIS — R319 Hematuria, unspecified: Secondary | ICD-10-CM | POA: Diagnosis not present

## 2017-11-05 DIAGNOSIS — E871 Hypo-osmolality and hyponatremia: Secondary | ICD-10-CM | POA: Insufficient documentation

## 2017-11-05 DIAGNOSIS — Z7902 Long term (current) use of antithrombotics/antiplatelets: Secondary | ICD-10-CM | POA: Diagnosis not present

## 2017-11-05 DIAGNOSIS — I69353 Hemiplegia and hemiparesis following cerebral infarction affecting right non-dominant side: Secondary | ICD-10-CM | POA: Diagnosis not present

## 2017-11-05 DIAGNOSIS — E162 Hypoglycemia, unspecified: Secondary | ICD-10-CM

## 2017-11-05 DIAGNOSIS — G9341 Metabolic encephalopathy: Secondary | ICD-10-CM | POA: Insufficient documentation

## 2017-11-05 DIAGNOSIS — I10 Essential (primary) hypertension: Secondary | ICD-10-CM | POA: Insufficient documentation

## 2017-11-05 DIAGNOSIS — N390 Urinary tract infection, site not specified: Secondary | ICD-10-CM | POA: Diagnosis not present

## 2017-11-05 DIAGNOSIS — E1140 Type 2 diabetes mellitus with diabetic neuropathy, unspecified: Secondary | ICD-10-CM | POA: Diagnosis not present

## 2017-11-05 DIAGNOSIS — Z7982 Long term (current) use of aspirin: Secondary | ICD-10-CM | POA: Insufficient documentation

## 2017-11-05 DIAGNOSIS — E11649 Type 2 diabetes mellitus with hypoglycemia without coma: Secondary | ICD-10-CM | POA: Diagnosis not present

## 2017-11-05 DIAGNOSIS — E785 Hyperlipidemia, unspecified: Secondary | ICD-10-CM

## 2017-11-05 DIAGNOSIS — Z794 Long term (current) use of insulin: Secondary | ICD-10-CM | POA: Insufficient documentation

## 2017-11-05 DIAGNOSIS — E860 Dehydration: Secondary | ICD-10-CM | POA: Diagnosis not present

## 2017-11-05 DIAGNOSIS — I69391 Dysphagia following cerebral infarction: Secondary | ICD-10-CM

## 2017-11-05 DIAGNOSIS — E870 Hyperosmolality and hypernatremia: Secondary | ICD-10-CM | POA: Diagnosis not present

## 2017-11-05 DIAGNOSIS — R4701 Aphasia: Secondary | ICD-10-CM | POA: Diagnosis present

## 2017-11-05 DIAGNOSIS — E43 Unspecified severe protein-calorie malnutrition: Secondary | ICD-10-CM | POA: Diagnosis present

## 2017-11-05 DIAGNOSIS — G40909 Epilepsy, unspecified, not intractable, without status epilepticus: Secondary | ICD-10-CM

## 2017-11-05 DIAGNOSIS — E1169 Type 2 diabetes mellitus with other specified complication: Secondary | ICD-10-CM | POA: Diagnosis present

## 2017-11-05 LAB — CBC WITH DIFFERENTIAL/PLATELET
BASOS ABS: 0 10*3/uL (ref 0.0–0.1)
Basophils Relative: 0 %
Eosinophils Absolute: 0.1 10*3/uL (ref 0.0–0.7)
Eosinophils Relative: 1 %
HCT: 31.2 % — ABNORMAL LOW (ref 36.0–46.0)
HEMOGLOBIN: 9.8 g/dL — AB (ref 12.0–15.0)
LYMPHS PCT: 23 %
Lymphs Abs: 1.6 10*3/uL (ref 0.7–4.0)
MCH: 26.4 pg (ref 26.0–34.0)
MCHC: 31.4 g/dL (ref 30.0–36.0)
MCV: 84.1 fL (ref 78.0–100.0)
MONO ABS: 0.6 10*3/uL (ref 0.1–1.0)
Monocytes Relative: 9 %
NEUTROS PCT: 67 %
Neutro Abs: 4.8 10*3/uL (ref 1.7–7.7)
PLATELETS: 460 10*3/uL — AB (ref 150–400)
RBC: 3.71 MIL/uL — AB (ref 3.87–5.11)
RDW: 18.6 % — ABNORMAL HIGH (ref 11.5–15.5)
WBC: 7.1 10*3/uL (ref 4.0–10.5)

## 2017-11-05 LAB — URINALYSIS, ROUTINE W REFLEX MICROSCOPIC
BILIRUBIN URINE: NEGATIVE
GLUCOSE, UA: NEGATIVE mg/dL
Hgb urine dipstick: NEGATIVE
KETONES UR: NEGATIVE mg/dL
Leukocytes, UA: NEGATIVE
Nitrite: NEGATIVE
PH: 5 (ref 5.0–8.0)
PROTEIN: 30 mg/dL — AB
Specific Gravity, Urine: 1.018 (ref 1.005–1.030)

## 2017-11-05 LAB — CBG MONITORING, ED
GLUCOSE-CAPILLARY: 82 mg/dL (ref 65–99)
GLUCOSE-CAPILLARY: 73 mg/dL (ref 65–99)
Glucose-Capillary: 24 mg/dL — CL (ref 65–99)
Glucose-Capillary: 58 mg/dL — ABNORMAL LOW (ref 65–99)

## 2017-11-05 LAB — COMPREHENSIVE METABOLIC PANEL
ALBUMIN: 1.2 g/dL — AB (ref 3.5–5.0)
ALK PHOS: 113 U/L (ref 38–126)
ALT: 13 U/L — AB (ref 14–54)
AST: 19 U/L (ref 15–41)
BUN: 34 mg/dL — ABNORMAL HIGH (ref 6–20)
CALCIUM: 7.3 mg/dL — AB (ref 8.9–10.3)
CO2: 22 mmol/L (ref 22–32)
CREATININE: 0.77 mg/dL (ref 0.44–1.00)
Chloride: >130 mmol/L (ref 101–111)
GFR calc Af Amer: >60 mL/min (ref >60)
GFR calc non Af Amer: >60 mL/min (ref >60)
GLUCOSE: 33 mg/dL — AB (ref 65–99)
Potassium: 3.4 mmol/L — ABNORMAL LOW (ref 3.5–5.1)
SODIUM: 163 mmol/L — AB (ref 135–145)
TOTAL PROTEIN: 5.5 g/dL — AB (ref 6.5–8.1)
Total Bilirubin: 0.4 mg/dL (ref 0.3–1.2)

## 2017-11-05 LAB — GLUCOSE, CAPILLARY
GLUCOSE-CAPILLARY: 81 mg/dL (ref 65–99)
GLUCOSE-CAPILLARY: 73 mg/dL (ref 65–99)

## 2017-11-05 LAB — MRSA PCR SCREENING: MRSA BY PCR: NEGATIVE

## 2017-11-05 LAB — MAGNESIUM: MAGNESIUM: 2.1 mg/dL (ref 1.7–2.4)

## 2017-11-05 MED ORDER — DEXTROSE 50 % IV SOLN
INTRAVENOUS | Status: AC
  Administered 2017-11-05: 50 mL via INTRAVENOUS
  Filled 2017-11-05: qty 50

## 2017-11-05 MED ORDER — CEFTRIAXONE SODIUM 1 G IJ SOLR
1.0000 g | Freq: Once | INTRAMUSCULAR | Status: AC
  Administered 2017-11-05: 1 g via INTRAVENOUS
  Filled 2017-11-05: qty 10

## 2017-11-05 MED ORDER — SODIUM CHLORIDE 0.9 % IV SOLN
1.0000 g | INTRAVENOUS | Status: DC
  Filled 2017-11-05 (×2): qty 10

## 2017-11-05 MED ORDER — CEFTRIAXONE SODIUM 1 G IJ SOLR
1.0000 g | INTRAMUSCULAR | Status: DC
  Filled 2017-11-05 (×2): qty 10

## 2017-11-05 MED ORDER — ENOXAPARIN SODIUM 40 MG/0.4ML ~~LOC~~ SOLN
40.0000 mg | SUBCUTANEOUS | Status: DC
  Administered 2017-11-05: 40 mg via SUBCUTANEOUS
  Filled 2017-11-05: qty 0.4

## 2017-11-05 MED ORDER — DEXTROSE 5 % AND 0.9 % NACL IV BOLUS
1000.0000 mL | Freq: Once | INTRAVENOUS | Status: AC
  Administered 2017-11-05: 1000 mL via INTRAVENOUS

## 2017-11-05 MED ORDER — DEXTROSE 50 % IV SOLN
1.0000 | Freq: Once | INTRAVENOUS | Status: AC
  Administered 2017-11-05: 50 mL via INTRAVENOUS

## 2017-11-05 MED ORDER — DEXTROSE-NACL 5-0.45 % IV SOLN
INTRAVENOUS | Status: DC
  Administered 2017-11-05: 16:00:00 via INTRAVENOUS

## 2017-11-05 MED ORDER — DEXTROSE 50 % IV SOLN
50.0000 mL | Freq: Once | INTRAVENOUS | Status: AC
  Administered 2017-11-05 (×2): 50 mL via INTRAVENOUS

## 2017-11-05 MED ORDER — DEXTROSE 50 % IV SOLN
INTRAVENOUS | Status: AC
  Administered 2017-11-06: 50 mL
  Filled 2017-11-05: qty 50

## 2017-11-05 MED ORDER — SODIUM CHLORIDE 0.9 % IV BOLUS
1000.0000 mL | Freq: Once | INTRAVENOUS | Status: AC
  Administered 2017-11-05: 1000 mL via INTRAVENOUS

## 2017-11-05 MED ORDER — ENSURE ENLIVE PO LIQD: 237.0000 mL | Freq: Two times a day (BID) | ORAL | Status: DC

## 2017-11-05 NOTE — ED Triage Notes (Signed)
Per Theadora Rama at [facility], pt has sodium of 165, chloride 134, glucose 44.  Staff gave glucagon and says cbg increased.Pt has had D5 1/2 normal saline infusing at 69ml per hour since June 19th.  Reports fluid was d/c'd this morning.  Staff reports pt's mental status is at baseline.  Reports pt is nonverbal and will follow you with her eyes at baseline.  Reports has IV in L ac that was placed on 6/19 and left arm is cool to touch.  Reports swelling and 2 blisters to r arm.  Provider instructed to send pt to er.

## 2017-11-05 NOTE — ED Notes (Signed)
Son at bedside.

## 2017-11-05 NOTE — ED Notes (Signed)
Pt non verbal has eyes open.  Pt seems more responsive at this time.  Rolled pt for rectal temp and pt moans.  In and out cath for urinalysis obtained.

## 2017-11-05 NOTE — ED Triage Notes (Signed)
EMS called for abnormal labs.  CBG 47 with EMS.  Pt in atrial fib 150-180 rate.  Pt has IV from facility left [facility] ? Working.

## 2017-11-05 NOTE — ED Provider Notes (Addendum)
[facility] Provider Note   CSN: [id number] Arrival date & time: 11/05/17  1013     History   Chief Complaint Chief Complaint  Patient presents with  . Abnormal Lab    HPI Sandra [name] is a 77 y.o. female.  Patient brought to the emergency department because her blood glucose was low and she was more confused.  Patient has had a stroke and is nonverbal anyway.  She is a DNR and her power of attorney stated that he has been thinking about just making her palliative care.  Patient's blood sugar was low and the nursing home.  And her sugar initially here was in the 20s  The history is provided by a relative. No language interpreter was used.  Weakness  Primary symptoms include no focal weakness. This is a recurrent problem. The current episode started 1 to 2 hours ago. The problem has not changed since onset.There was no focality noted. There has been no fever. Pertinent negatives include no shortness of breath.    Past Medical History:  Diagnosis Date  . Colon polyp   . Constipation   . Diabetes mellitus without complication ([place])   . GI bleed   . Hemorrhoids   . High cholesterol   . Hypertension   . PAD (peripheral artery disease) ([place])   . Peripheral neuropathy   . Rectal bleeding    "for 7 years"  . Rectal bleeding   . Stroke [facility])     Patient Active Problem List   Diagnosis Date Noted  . Severe protein-calorie malnutrition ([place])   . Pressure injury of skin 10/19/2017  . Palliative care by specialist   . Goals of care, counseling/discussion   . DNR (do not resuscitate) discussion   . Colitis 10/13/2017  . Acute renal failure ([place]) 10/13/2017  . Obstipation 10/13/2017  . Resting tremor 10/13/2017  . Labile blood glucose   . Urinary retention   . Global aphasia   . Hemiparesthesia   . 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 Ischemic stroke ([place])   . Diabetes mellitus type 2 in obese ([place])    . Benign essential HTN   . Noncompliance   . FUO (fever of unknown origin)   . Leukocytosis   . Dysphagia, post-stroke   . TIA (transient ischemic attack) 06/15/2017  . Hyperlipidemia associated with type 2 diabetes mellitus ([place]) 06/14/2017  . Type 2 diabetes mellitus with neurological complications ([place]) 03/54/6568  . Stroke ([place]) 03/31/2017  . History of adenomatous polyp of colon 02/11/2017  . Essential hypertension 08/12/2016  . Hemorrhoids 06/16/2016  . Constipation 06/16/2016    Past Surgical History:  Procedure Laterality Date  . ABDOMINAL HYSTERECTOMY    . COLONOSCOPY     X 7  . COLONOSCOPY N/A 03/21/2017   Dr. Oneida Alar: 4 mm mid ascending colon tubular adenoma, significantly redundant, multiple small and large-mouthed diverticula in recto-sigmoid, sigmoid, and descending colon. internal hemorrhoids banded X 3.   . ESOPHAGOGASTRODUODENOSCOPY  2017   [place]: questionable mild stricture of GE junction with nodularity s/p dilation and biopsy, mild chronic gastritis, negative H.pylori     OB History    Gravida      Para      Term      Preterm      AB      Living  6     SAB      TAB      Ectopic  Multiple      Live Births               Home Medications    Prior to Admission medications   Medication Sig Start Date End Date Taking? Authorizing Provider  acetaminophen (TYLENOL) 325 MG tablet Take 1-2 tablets (325-650 mg total) by mouth every 4 (four) hours as needed for mild pain. 07/14/17  Yes Love, Ivan Anchors, PA-C  Amino Acids-Protein Hydrolys (FEEDING SUPPLEMENT, PRO-STAT SUGAR FREE 64,) LIQD Take 30 mLs by mouth 2 (two) times daily. 10/29/17  Yes Barton Dubois, MD  amLODipine (NORVASC) 10 MG tablet Take 10 mg by mouth daily.   Yes [provider]  aspirin 325 MG EC tablet Take 1 tablet (325 mg total) by mouth daily. 07/14/17  Yes Love, Ivan Anchors, PA-C  atorvastatin (LIPITOR) 40 MG tablet Take 1 tablet (40 mg total) by mouth daily at 6  PM. 06/20/17  Yes Johnson, Clanford L, MD  bethanechol (URECHOLINE) 25 MG tablet Take 25 mg by mouth 3 (three) times daily.   Yes [provider]  citalopram (CELEXA) 20 MG tablet Take 1 tablet (20 mg total) by mouth daily. 10/30/17  Yes Barton Dubois, MD  clopidogrel (PLAVIX) 75 MG tablet Take 1 tablet (75 mg total) by mouth daily with breakfast. 06/21/17  Yes Johnson, Clanford L, MD  collagenase (SANTYL) ointment Apply 1 application topically daily.   Yes [provider]  gabapentin (NEURONTIN) 100 MG capsule Take 100 mg by mouth 3 (three) times daily.   Yes [provider]  insulin glargine (LANTUS) 100 UNIT/ML injection Inject 0.15 mLs (15 Units total) into the skin daily. Patient taking differently: Inject 10 Units into the skin at bedtime.  10/29/17  Yes Barton Dubois, MD  levETIRAcetam (KEPPRA) 100 MG/ML solution Take 100 mg by mouth See admin instructions. Give 1 ml by mouth one time a day for seizures   Yes [provider]  LEVETIRACETAM PO Take 1 tablet by mouth daily. Give 100mg  by mouth one time a day for anticonvulsant.   Yes [provider]  metFORMIN (GLUCOPHAGE) 500 MG tablet Take by mouth See admin instructions. Give 1 tablet by mouth with meals for DM.   Yes [provider]  metoprolol tartrate (LOPRESSOR) 25 MG tablet Take 1 tablet (25 mg total) by mouth 2 (two) times daily. 10/29/17  Yes Barton Dubois, MD  Multiple Vitamins-Minerals (ONE-A-DAY 50 PLUS PO) Take 1 tablet by mouth daily.   Yes [provider]  senna-docusate (SENOKOT-S) 8.6-50 MG tablet Take 2 tablets by mouth at bedtime. Patient taking differently: Take 2 tablets by mouth daily.  07/14/17  Yes Love, Ivan Anchors, PA-C  tamsulosin (FLOMAX) 0.4 MG CAPS capsule Take 1 capsule (0.4 mg total) by mouth daily after supper. 07/14/17  Yes Love, Ivan Anchors, PA-C  vitamin B-12 (CYANOCOBALAMIN) 1000 MCG tablet Take 1,000 mcg by mouth daily.   Yes [provider]    bisacodyl (DULCOLAX) 10 MG suppository Place 10 mg rectally once as needed for moderate constipation.    [provider]    Family History Family History  Problem Relation Age of Onset  . Diabetes Mother   . Cancer Father   . Diabetes Sister   . Diabetes Brother   . Diabetes Daughter   . Diabetes Maternal Grandmother   . Diabetes Daughter   . Colon cancer Neg Hx     Social History Social History   Tobacco Use  . Smoking status: Never Smoker  .  Smokeless tobacco: Never Used  Substance Use Topics  . Alcohol use: No  . Drug use: No     Allergies   Hctz [hydrochlorothiazide]; Morphine and related; and Valium [diazepam]   Review of Systems Review of Systems  Unable to perform ROS: Mental status change  Respiratory: Negative for shortness of breath.   Neurological: Positive for weakness. Negative for focal weakness.     Physical Exam Updated Vital Signs BP (!) 100/30   Pulse 83   Temp 99.3 F (37.4 C) (Rectal)   Resp (!) 22   Ht 5\' 3"  (1.6 m)   Wt 85.3 kg (188 lb)   SpO2 95%   BMI 33.30 kg/m   Physical Exam  Constitutional: She appears well-developed.  HENT:  Head: Normocephalic.  Oral pharynx shows dehydration  Eyes: Conjunctivae and EOM are normal. No scleral icterus.  Neck: Neck supple. No thyromegaly present.  Cardiovascular: Exam reveals no gallop and no friction rub.  No murmur heard. Irregular rapid rate  Pulmonary/Chest: No stridor. She has no wheezes. She has no rales. She exhibits no tenderness.  Abdominal: She exhibits no distension. There is no tenderness. There is no rebound.  Musculoskeletal: Normal range of motion. She exhibits no edema.  Lymphadenopathy:    She has no cervical adenopathy.  Neurological: She exhibits normal muscle tone. Coordination normal.  Patient lethargic but nonverbal anyway.  Patient also has old stroke making her right arm and leg extremely weak  Skin: No rash noted. No erythema.     ED Treatments /  Results  Labs (all labs ordered are listed, but only abnormal results are displayed) Labs Reviewed  CBC WITH DIFFERENTIAL/PLATELET - Abnormal; Notable for the following components:      Result Value   RBC 3.71 (*)    Hemoglobin 9.8 (*)    HCT 31.2 (*)    RDW 18.6 (*)    Platelets 460 (*)    All other components within normal limits  COMPREHENSIVE METABOLIC PANEL - Abnormal; Notable for the following components:   Sodium 163 (*)    Potassium 3.4 (*)    Chloride >130 (*)    Glucose, Bld 33 (*)    BUN 34 (*)    Calcium 7.3 (*)    Total Protein 5.5 (*)    Albumin 1.2 (*)    ALT 13 (*)    All other components within normal limits  URINALYSIS, ROUTINE W REFLEX MICROSCOPIC - Abnormal; Notable for the following components:   Color, Urine AMBER (*)    APPearance CLOUDY (*)    Protein, ur 30 (*)    Bacteria, UA MANY (*)    All other components within normal limits  CBG MONITORING, ED - Abnormal; Notable for the following components:   Glucose-Capillary 24 (*)    All other components within normal limits  CBG MONITORING, ED - Abnormal; Notable for the following components:   Glucose-Capillary 58 (*)    All other components within normal limits  URINE CULTURE  I-STAT CHEM 8, ED  CBG MONITORING, ED  CBG MONITORING, ED    EKG None  Radiology No results found.  Procedures Procedures (including critical care time)  Medications Ordered in ED Medications  dextrose 50 % solution 50 mL (has no administration in time range)  dextrose 50 % solution (50 mLs  Given 11/05/17 1030)  sodium chloride 0.9 % bolus 1,000 mL (1,000 mLs Intravenous New Bag/Given 11/05/17 1039)  dextrose 50 % solution 50 mL (50 mLs Intravenous Given 11/05/17  1103)  cefTRIAXone (ROCEPHIN) 1 g in sodium chloride 0.9 % 100 mL IVPB (0 g Intravenous Stopped 11/05/17 1239)     Initial Impression / Assessment and Plan / ED Course  I have reviewed the triage vital signs and the nursing notes.  Pertinent labs &  imaging results that were available during my care of the patient were reviewed by me and considered in my medical decision making (see chart for details).     CRITICAL CARE Performed by: Milton Ferguson Total critical care time: 45 minutes Critical care time was exclusive of separately billable procedures and treating other patients. Critical care was necessary to treat or prevent imminent or life-threatening deterioration. Critical care was time spent personally by me on the following activities: development of treatment plan with patient and/or surrogate as well as nursing, discussions with consultants, evaluation of patient's response to treatment, examination of patient, obtaining history from patient or surrogate, ordering and performing treatments and interventions, ordering and review of laboratory studies, ordering and review of radiographic studies, pulse oximetry and re-evaluation of patient's condition.  Patient severely hypoglycemic.  She is received 3 Amp of D50 and her sugar still is in over 100.  Patient also has a urinary tract infection and is severely dehydrated.  She has been given normal saline and D5 normal saline.  Patient is a DNR and her son is thinking of doing only palliative care.  The patient will be admitted for IV fluids and antibiotics and glucose if needed Final Clinical Impressions(s) / ED Diagnoses   Final diagnoses:  Hypoglycemia    ED Discharge Orders    None       Milton Ferguson, MD 11/05/17 1259    Milton Ferguson, MD 11/05/17 1300

## 2017-11-05 NOTE — ED Notes (Signed)
Call from Lab  Critical:  NA 163 [place] greater than 130 Glucose 33  Dr Earnest Conroy informed

## 2017-11-05 NOTE — ED Notes (Signed)
Friend at bedside.

## 2017-11-05 NOTE — Plan of Care (Signed)
Not progressing 

## 2017-11-05 NOTE — ED Notes (Signed)
ua to find green top for Chem 8 I stat  Also unable to find in computer

## 2017-11-05 NOTE — ED Notes (Signed)
Report to Morgan, RN 

## 2017-11-05 NOTE — ED Notes (Signed)
Call to floor   Bed marked ready  RN unable to take report  Will call back

## 2017-11-05 NOTE — H&P (Signed)
History and Physical  Sandra [name] EPP:[id number] DOB: 1940/08/12 DOA: 11/05/2017  Referring physician: None PCP: Caren Macadam, MD  Outpatient Specialists: None Patient coming from: Nursing home  Chief Complaint: Low blood sugar and generalized weakness  HPI: Sandra [name] is a 77 y.o. female with medical history significant for stroke with right hemiparesis hypertension diabetes seizure disorder.Patient was brought to the emergency department because her blood glucose was low and she was more confused.  Patient has had a stroke and is nonverbal anyway.  She is a DNR and her power of attorney stated that he has been thinking about just making her palliative care.  Patient's blood sugar was low at the nursing home.  And her sugar initially in the emergency department was in the 20s.she had generalized weakness she is nonverbal but no focal weakness.  Her appetite is very poor she is not eating or drinking well This is a recurrent problem she had a similar complaint a week ago October 30, 2017 when she was admitted for the same thing. The current episode started 1 to 2 hours ago. The problem has not changed since onset.There was no focality noted. There has been no fever. Pertinent negatives include no shortness of breath.   The history is provided by a relative and the emergency room physician. No language interpreter was used.         ED Course:   Patient severely hypoglycemic.  She is received 3 Amp of D50 and her sugar still is in over 100.  Patient also has a urinary tract infection and is severely dehydrated.  She has been given normal saline and D5 normal saline.  Patient is a DNR and her son is thinking of doing only palliative care.  The patient will be admitted for IV fluids and antibiotics and glucose if needed   Review of Systems:  Unable to perform ROS: Mental status change     Past Medical History:  Diagnosis Date  . Colon polyp   . Constipation   . Diabetes  mellitus without complication ([place])   . GI bleed   . Hemorrhoids   . High cholesterol   . Hypertension   . PAD (peripheral artery disease) ([place])   . Peripheral neuropathy   . Rectal bleeding    "for 7 years"  . Rectal bleeding   . Stroke [facility])    Past Surgical History:  Procedure Laterality Date  . ABDOMINAL HYSTERECTOMY    . COLONOSCOPY     X 7  . COLONOSCOPY N/A 03/21/2017   Dr. Oneida Alar: 4 mm mid ascending colon tubular adenoma, significantly redundant, multiple small and large-mouthed diverticula in recto-sigmoid, sigmoid, and descending colon. internal hemorrhoids banded X 3.   . ESOPHAGOGASTRODUODENOSCOPY  2017   [place]: questionable mild stricture of GE junction with nodularity s/p dilation and biopsy, mild chronic gastritis, negative H.pylori    Social History:  reports that she has never smoked. She has never used smokeless tobacco. She reports that she does not drink alcohol or use drugs.   Allergies  Allergen Reactions  . Hctz [Hydrochlorothiazide] Other (See Comments)    Caused pancreatitis flare up  . Morphine And Related Other (See Comments)    dizziness  . Valium [Diazepam] Other (See Comments)    Confusion, blurred vision    Family History  Problem Relation Age of Onset  . Diabetes Mother   . Cancer Father   . Diabetes Sister   . Diabetes Brother   . Diabetes Daughter   .  Diabetes Maternal Grandmother   . Diabetes Daughter   . Colon cancer Neg Hx       Prior to Admission medications   Medication Sig Start Date End Date Taking? Authorizing Provider  acetaminophen (TYLENOL) 325 MG tablet Take 1-2 tablets (325-650 mg total) by mouth every 4 (four) hours as needed for mild pain. 07/14/17  Yes Love, Ivan Anchors, PA-C  Amino Acids-Protein Hydrolys (FEEDING SUPPLEMENT, PRO-STAT SUGAR FREE 64,) LIQD Take 30 mLs by mouth 2 (two) times daily. 10/29/17  Yes Barton Dubois, MD  amLODipine (NORVASC) 10 MG tablet Take 10 mg by mouth daily.   Yes [provider]  aspirin 325 MG EC tablet Take 1 tablet (325 mg total) by mouth daily. 07/14/17  Yes Love, Ivan Anchors, PA-C  atorvastatin (LIPITOR) 40 MG tablet Take 1 tablet (40 mg total) by mouth daily at 6 PM. 06/20/17  Yes Johnson, Clanford L, MD  bethanechol (URECHOLINE) 25 MG tablet Take 25 mg by mouth 3 (three) times daily.   Yes [provider]  citalopram (CELEXA) 20 MG tablet Take 1 tablet (20 mg total) by mouth daily. 10/30/17  Yes Barton Dubois, MD  clopidogrel (PLAVIX) 75 MG tablet Take 1 tablet (75 mg total) by mouth daily with breakfast. 06/21/17  Yes Johnson, Clanford L, MD  collagenase (SANTYL) ointment Apply 1 application topically daily.   Yes [provider]  gabapentin (NEURONTIN) 100 MG capsule Take 100 mg by mouth 3 (three) times daily.   Yes [provider]  insulin glargine (LANTUS) 100 UNIT/ML injection Inject 0.15 mLs (15 Units total) into the skin daily. Patient taking differently: Inject 10 Units into the skin at bedtime.  10/29/17  Yes Barton Dubois, MD  levETIRAcetam (KEPPRA) 100 MG/ML solution Take 100 mg by mouth See admin instructions. Give 1 ml by mouth one time a day for seizures   Yes [provider]  LEVETIRACETAM PO Take 1 tablet by mouth daily. Give 100mg  by mouth one time a day for anticonvulsant.   Yes [provider]  metFORMIN (GLUCOPHAGE) 500 MG tablet Take by mouth See admin instructions. Give 1 tablet by mouth with meals for DM.   Yes [provider]  metoprolol tartrate (LOPRESSOR) 25 MG tablet Take 1 tablet (25 mg total) by mouth 2 (two) times daily. 10/29/17  Yes Barton Dubois, MD  Multiple Vitamins-Minerals (ONE-A-DAY 50 PLUS PO) Take 1 tablet by mouth daily.   Yes [provider]  senna-docusate (SENOKOT-S) 8.6-50 MG tablet Take 2 tablets by mouth at bedtime. Patient taking differently: Take 2 tablets by mouth daily.  07/14/17  Yes Love, Ivan Anchors, PA-C  tamsulosin (FLOMAX) 0.4 MG CAPS capsule Take 1 capsule  (0.4 mg total) by mouth daily after supper. 07/14/17  Yes Love, Ivan Anchors, PA-C  vitamin B-12 (CYANOCOBALAMIN) 1000 MCG tablet Take 1,000 mcg by mouth daily.   Yes [provider]  bisacodyl (DULCOLAX) 10 MG suppository Place 10 mg rectally once as needed for moderate constipation.    [provider]    Physical Exam: BP (!) 100/30   Pulse 83   Temp 99.3 F (37.4 C) (Rectal)   Resp (!) 22   Ht 5\' 3"  (1.6 m)   Wt 85.3 kg (188 lb)   SpO2 95%   BMI 33.30 kg/m    Physical Exam  Constitutional: She appears well-developed.  HENT:  Head: Normocephalic.  Oral pharynx shows dehydration  Eyes: Conjunctivae and EOM are normal. No scleral icterus.  Neck: Neck  supple. No thyromegaly present.  Cardiovascular: Exam reveals no gallop and no friction rub.  No murmur heard. Irregular rapid rate  Pulmonary/Chest: No stridor. She has no wheezes. She has no rales. She exhibits no tenderness.  Abdominal: She exhibits no distension. There is no tenderness. There is no rebound.  Musculoskeletal: Normal range of motion. She exhibits no edema.  Lymphadenopathy:    She has no cervical adenopathy.  Neurological: She exhibits normal muscle tone. Coordination normal.  Patient lethargic but nonverbal anyway.  Patient also has old stroke making her right arm and leg extremely weak  Skin: No rash noted. No erythema.            Labs on Admission:  Basic Metabolic Panel: Recent Labs  Lab 11/05/17 1024  NA 163*  K 3.4*  CL >130*  CO2 22  GLUCOSE 33*  BUN 34*  CREATININE 0.77  CALCIUM 7.3*   Liver Function Tests: Recent Labs  Lab 11/05/17 1024  AST 19  ALT 13*  ALKPHOS 113  BILITOT 0.4  PROT 5.5*  ALBUMIN 1.2*   No results for input(s): LIPASE, AMYLASE in the last 168 hours. No results for input(s): AMMONIA in the last 168 hours. CBC: Recent Labs  Lab 11/05/17 1024  WBC 7.1  NEUTROABS 4.8  HGB 9.8*  HCT 31.2*  MCV 84.1  PLT 460*   Cardiac Enzymes: No results  for input(s): CKTOTAL, CKMB, CKMBINDEX, TROPONINI in the last 168 hours.  BNP (last 3 results) No results for input(s): BNP in the last 8760 hours.  ProBNP (last 3 results) No results for input(s): PROBNP in the last 8760 hours.  CBG: Recent Labs  Lab 10/29/17 1630 11/05/17 1022 11/05/17 1043 11/05/17 1123 11/05/17 1242  GLUCAP 127* 24* 58* 82 73    Radiological Exams on Admission: No results found.  EKG: Independently reviewed.    Assessment/Plan Present on Admission: . Dehydration  1.Dehydration:  We will begin IV gentle IV heart rehydration with D5 normal half-normal saline 2.hyponatremia: D5 half-normal saline has been started we will continue with that 3.Urinary tract infection we will start her on ceftriaxone she received the first dose in the emergency department 4.Hypo-glycemia IV normal saline.  Patient has very poor intake both liquid and food.  We will start her on Ensure liquid supplements.  She is not a candidate for NG tube placement or PEG placement as she is DNR and they are considering comfort measures. 5.Mild hypotension.  Rehydrate        Active Problems:   Dehydration Hyponatremia Urinary tract infection Hypo-glycemia Mild hypotension  DVT prophylaxis: Lovenox  Code Status: DNR  Family Communication: With son Mr.James Brightbill  Disposition Plan: Back to nursing home with hospice  Consults called: None  Admission status: Observation    Cristal Deer MD [facility] Pager [phone]  If 7PM-7AM, please contact night-coverage [email or web address] Password [facility]  11/05/2017, 1:29 PM

## 2017-11-05 NOTE — ED Notes (Signed)
Pt has decubitus to sacral area that is healing.  Also has dressing to right sacral area that is dry and intact.

## 2017-11-06 DIAGNOSIS — I10 Essential (primary) hypertension: Secondary | ICD-10-CM

## 2017-11-06 DIAGNOSIS — E11649 Type 2 diabetes mellitus with hypoglycemia without coma: Secondary | ICD-10-CM | POA: Diagnosis not present

## 2017-11-06 LAB — GLUCOSE, CAPILLARY
GLUCOSE-CAPILLARY: 36 mg/dL — AB (ref 65–99)
Glucose-Capillary: 154 mg/dL — ABNORMAL HIGH (ref 65–99)
Glucose-Capillary: 65 mg/dL (ref 65–99)

## 2017-11-06 MED ORDER — GLUCOSE 40 % PO GEL
ORAL | Status: AC
Start: 2017-11-06 — End: 2017-11-06
  Administered 2017-11-06
  Filled 2017-11-06: qty 1

## 2017-11-06 MED ORDER — DEXTROSE 50 % IV SOLN
INTRAVENOUS | Status: AC
  Administered 2017-11-06: 50 mL
  Filled 2017-11-06: qty 50

## 2017-11-06 MED ORDER — MORPHINE SULFATE (PF) 2 MG/ML IV SOLN: 2.0000 mg | INTRAVENOUS | Status: DC | PRN

## 2017-11-06 MED ORDER — LORAZEPAM 2 MG/ML IJ SOLN: 1.0000 mg | INTRAMUSCULAR | Status: DC | PRN

## 2017-11-08 LAB — URINE CULTURE: Culture: >=100000 — AB

## 2017-11-14 NOTE — Progress Notes (Signed)
Hypoglycemic Event  CBG: 36  Treatment: Dextrose 50 ml by IV  Symptoms: cold and clammy  Follow-up CBG: Time:0051 CBG Result:65 then 154 at 0117 after 50 ml more of dextrose  Possible Reasons for Event:not eating Comments/MD notified:Notified Dr. Judith Part

## 2017-11-14 NOTE — Progress Notes (Signed)
Patient expired at 74. Death certificate signed.

## 2017-11-14 NOTE — Progress Notes (Signed)
Patient cleaned up and toe tagged. Patient placed in body bag and transported to the morgue. Waiting on [facility] to release the patient before having bed control call [facility] home, [facility] to get patient.

## 2017-11-14 NOTE — Progress Notes (Signed)
Patient expired at 0438. 2 nurses were present when she took her last breath- Tanya and I. We confirmed that she was pulseless and breathless. Dr. Manuella Ghazi was notified. Her son Sandra [name] was notified.

## 2017-11-14 DEATH — deceased

## 2017-12-07 DIAGNOSIS — G40909 Epilepsy, unspecified, not intractable, without status epilepticus: Secondary | ICD-10-CM

## 2017-12-15 NOTE — Discharge Summary (Signed)
  Death Summary  Sandra [name] ZSW:[id number] DOB: 06-Dec-1940 DOA: Nov 19, 2017  PCP: Sandra Macadam, MD  Admit date: 11-19-17 Date of Death: 11-20-17 Time of Death: [phone]  History of present illness:  Sandra [name] is a 77 y.o. female with a history of previous stroke, right hemiparesis, hypertension, diabetes, seizure disorder, failure to thrive, was brought to the emergency room with low blood sugars and increasing confusion.  At baseline, patient was nonverbal.  On initial arrival, in the emergency department she was found to have a blood sugar in the 20s.  She was placed on dextrose infusion.  There was no reported fever, shortness of breath.  Her neurologic exam did not have any focality.  Patient only had a prolonged admission from 5/30- 6/15.  She had become very frail, debilitated and chronically ill.  The patient was admitted with dehydration and started on hydration.  She was found to have urinary tract infection which urine culture eventually grew enterococcus.  She started on intravenous ceftriaxone.  Despite receiving dextrose, she continued to be hypoglycemic.  Shortly after admission, patient unfortunately expired.  Since she was a DNR, CPR resuscitation was not initiated.  Family was made aware.  Final Diagnoses:     Hyperlipidemia associated with type 2 diabetes mellitus ([place])   Diabetes mellitus type 2 in obese ([place])   Benign essential HTN   Dysphagia, post-stroke   Hemiparesis of right nondominant side as late effect of cerebral infarction (H[facility])   Global aphasia   Severe protein-calorie malnutrition (H[facility])   Dehydration   Seizure disorder ([facility])   Hypoglycemia   Acute metabolic encephalopathy due to hypoglycemia superimposed on     baseline aphasia   Adult failure to thrive  The results of significant diagnostics from this hospitalization (including imaging, microbiology, ancillary and laboratory) are listed below for reference.    Significant Diagnostic  Studies: No results found.  Microbiology: No results found for this or any previous visit (from the past 240 hour(s)).   Labs: Basic Metabolic Panel: No results for input(s): NA, K, CL, CO2, GLUCOSE, BUN, CREATININE, CALCIUM, MG, PHOS in the last 168 hours. Liver Function Tests: No results for input(s): AST, ALT, ALKPHOS, BILITOT, PROT, ALBUMIN in the last 168 hours. No results for input(s): LIPASE, AMYLASE in the last 168 hours. No results for input(s): AMMONIA in the last 168 hours. CBC: No results for input(s): WBC, NEUTROABS, HGB, HCT, MCV, PLT in the last 168 hours. Cardiac Enzymes: No results for input(s): CKTOTAL, CKMB, CKMBINDEX, TROPONINI in the last 168 hours. D-Dimer No results for input(s): DDIMER in the last 72 hours. BNP: Invalid input(s): POCBNP CBG: No results for input(s): GLUCAP in the last 168 hours. Anemia work up No results for input(s): VITAMINB12, FOLATE, FERRITIN, TIBC, IRON, RETICCTPCT in the last 72 hours. Urinalysis    Component Value Date/Time   COLORURINE AMBER (A) 2017/11/19 1024   APPEARANCEUR CLOUDY (A) 11-19-17 1024   LABSPEC 1.018 2017/11/19 1024   PHURINE 5.0 11/19/17 1024   GLUCOSEU NEGATIVE 2017-11-19 1024   HGBUR NEGATIVE Nov 19, 2017 1024   BILIRUBINUR NEGATIVE 2017-11-19 1024   KETONESUR NEGATIVE 2017-11-19 1024   PROTEINUR 30 (A) 11/19/17 1024   NITRITE NEGATIVE 11/19/17 1024   LEUKOCYTESUR NEGATIVE Nov 19, 2017 1024   Sepsis Labs Invalid input(s): PROCALCITONIN,  WBC,  LACTICIDVEN     SIGNED:  Kathie Dike, MD  [facility] 12/07/2017, 7:51 PM Pager   If 7PM-7AM, please contact night-coverage [email or web address] Password [email or web address]

## 2018-05-07 IMAGING — CT CT HEAD W/O CM
3 series · 15 of 47 positions shown, 18 images · non-contrast
Comparison: None.

CLINICAL DATA: Headache with right sided weakness. Nausea.
Confusion.

EXAM:
CT HEAD WITHOUT CONTRAST
TECHNIQUE: Contiguous axial images were obtained from the base of the skull
through the vertex without intravenous contrast.

[Series 2: head wo · axial · 0.44mm/px · z∈[+1545,+1670]mm · 9 of 31 slices shown, 12 images]
[im 3/31  brain]
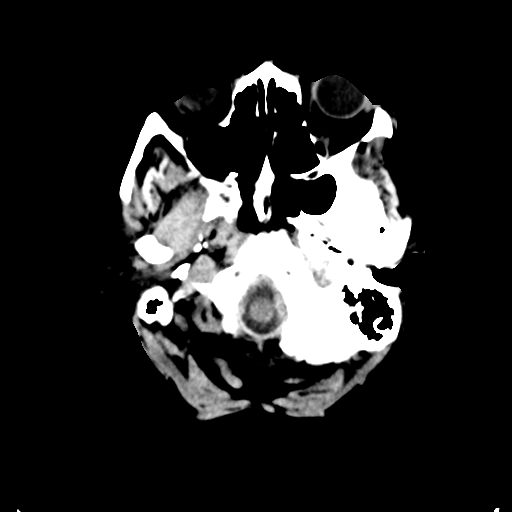
[im 3/31  bone]
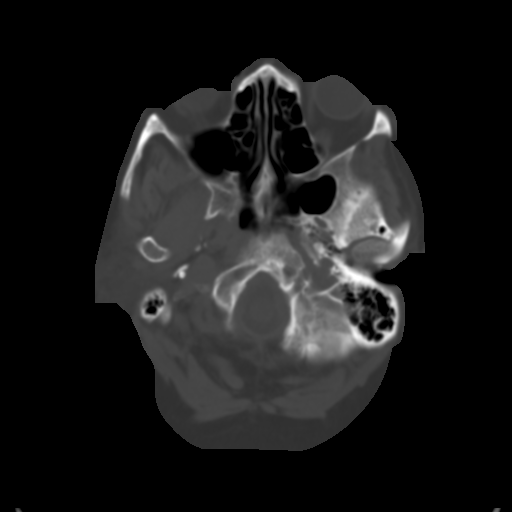
[im 6/31  brain]
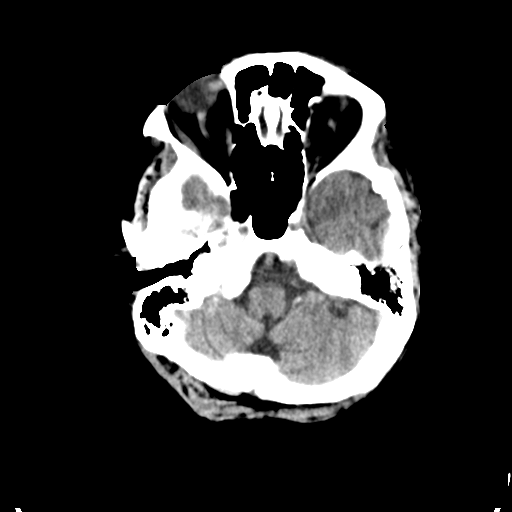
[im 9/31  brain]
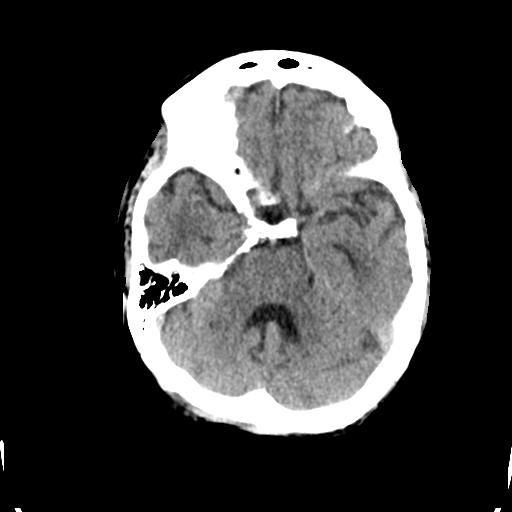
[im 12/31  brain]
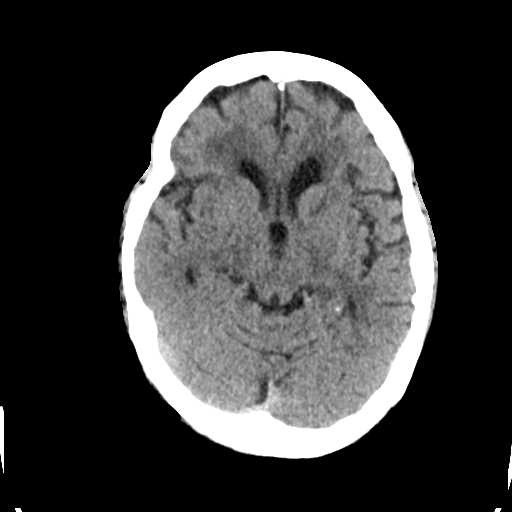
[im 16/31  brain]
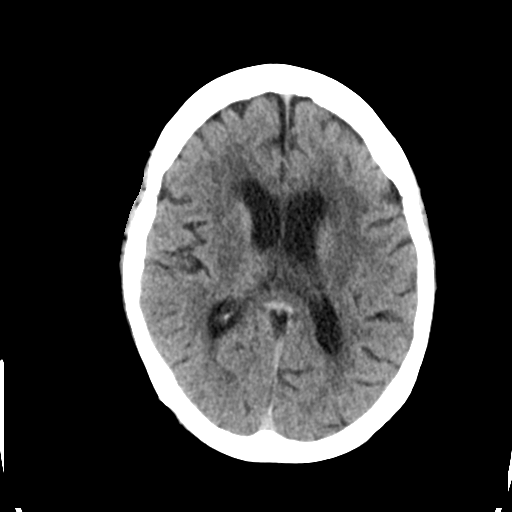
[im 16/31  bone]
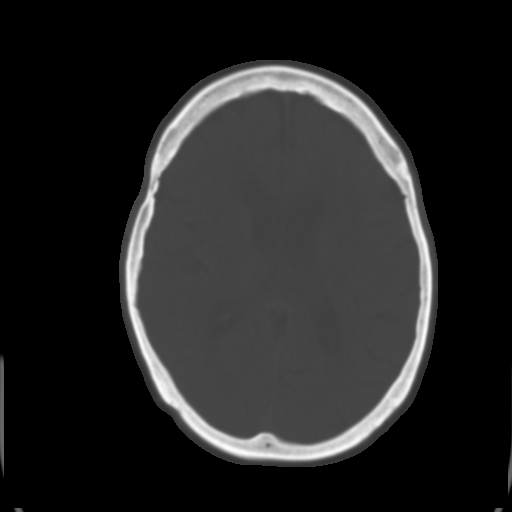
[im 19/31  brain]
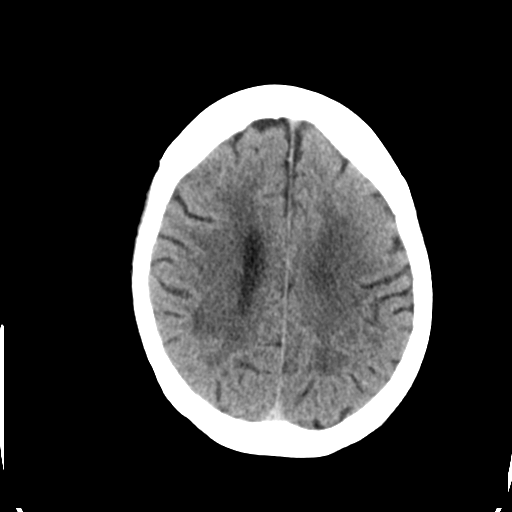
[im 22/31  brain]
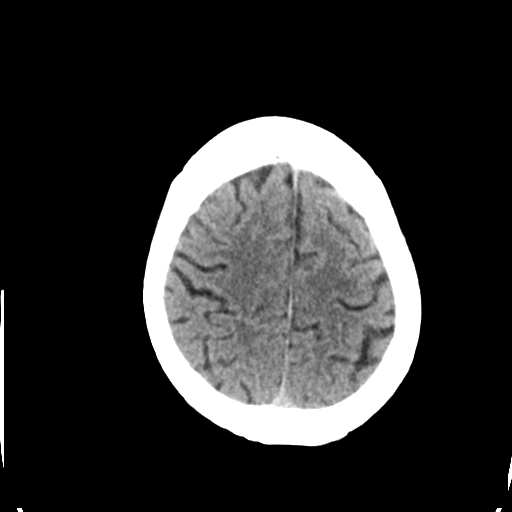
[im 25/31  brain]
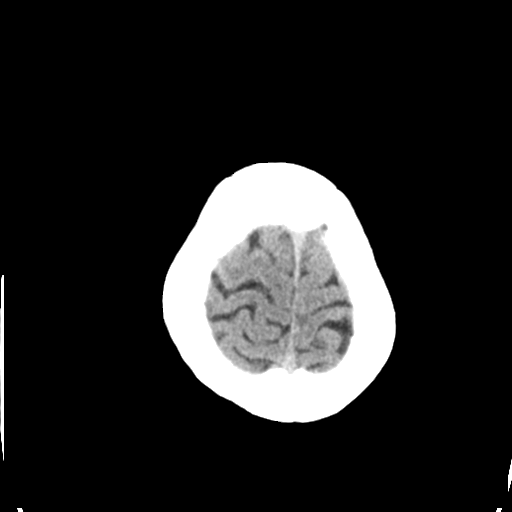
[im 28/31  brain]
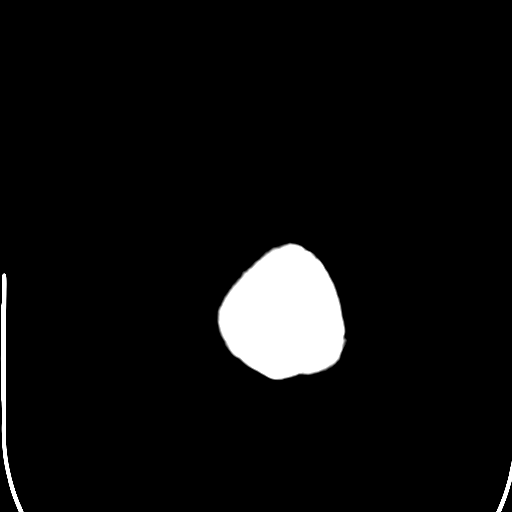
[im 28/31  bone]
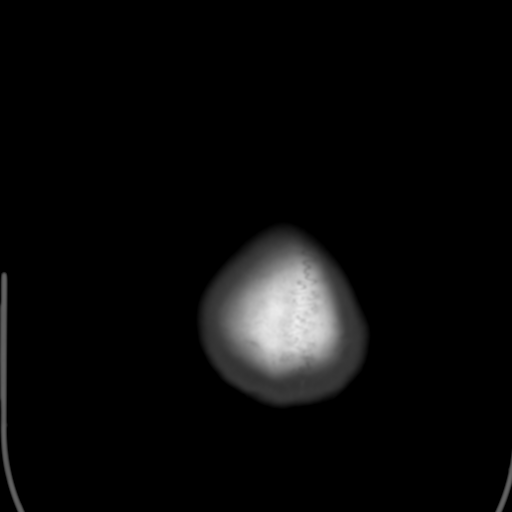

[Series 4: coronal soft tissue · coronal · 0.31mm/px · 3 of 65 slices shown]
[im 22/65  brain]
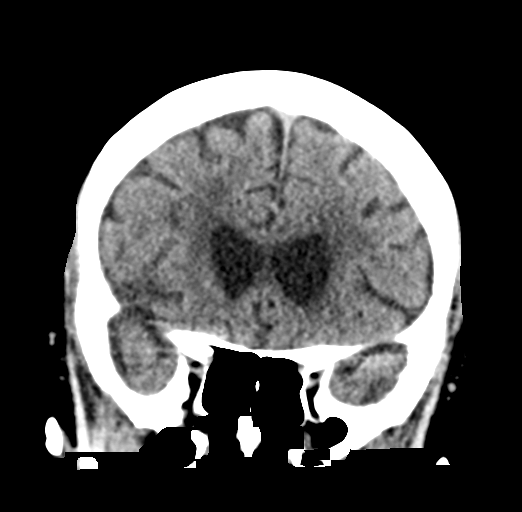
[im 29/65  brain]
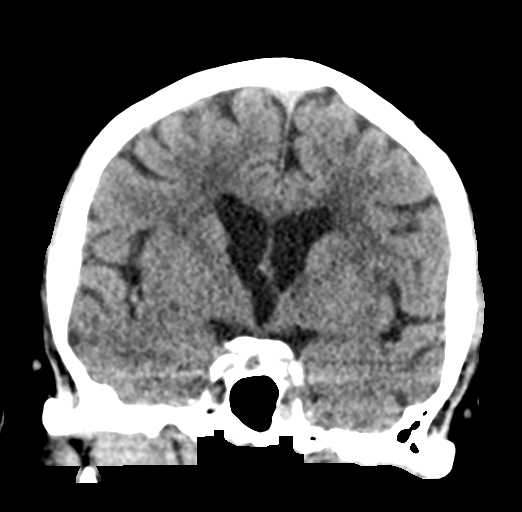
[im 36/65  brain]
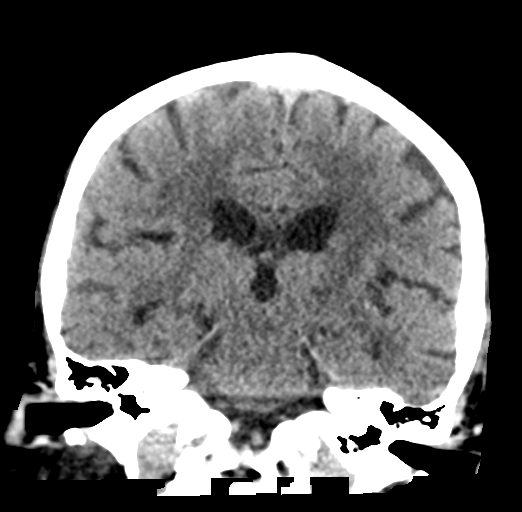

[Series 5: sagittal soft tissue · sagittal · 0.31mm/px · 3 of 58 slices shown]
[im 20/58  brain]
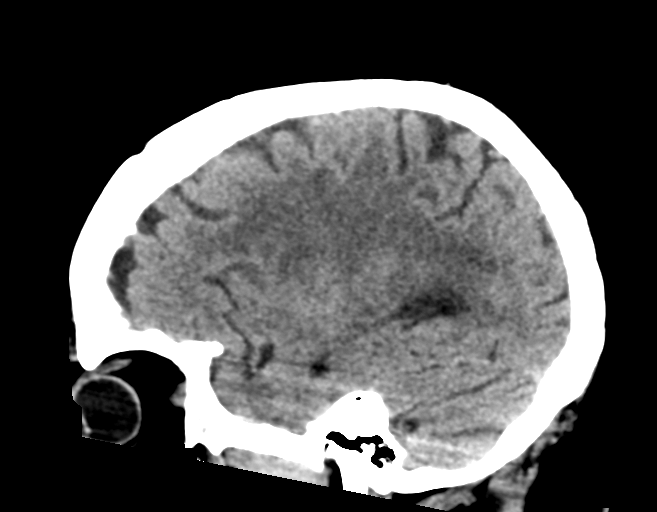
[im 29/58  brain]
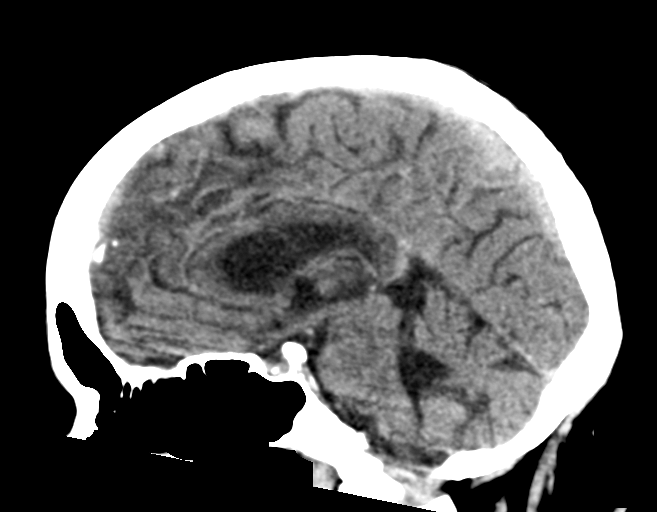
[im 39/58  brain]
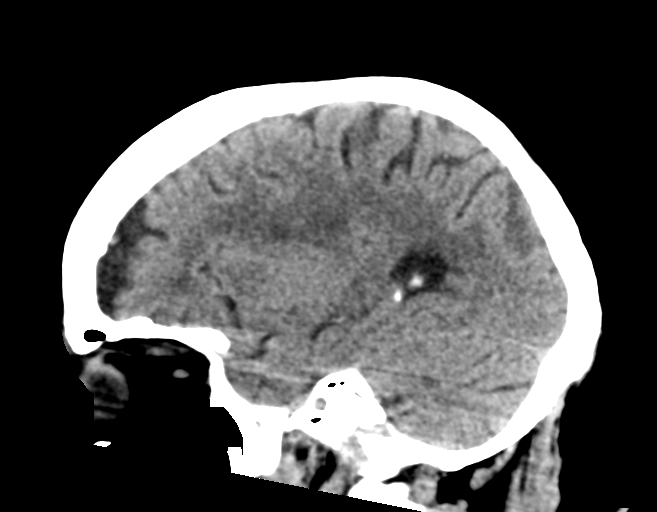

[15 of 47 positions shown; findings below may reference images not displayed]

FINDINGS: Brain: There is mild diffuse atrophy. There is no intracranial mass,
hemorrhage, extra-axial fluid collection, or midline shift. There is
patchy small vessel disease throughout the centra semiovale
bilaterally. Elsewhere gray-white compartments appear unremarkable.
No acute infarct is appreciable on this study.

Vascular: There is no hyperdense vessel. There is calcification in
each carotid siphon region.

Skull: The bony calvarium appears intact.

Sinuses/Orbits: There is mild mucosal thickening in several ethmoid
air cells. Other visualized paranasal sinuses are clear. Visualized
orbits appear symmetric bilaterally.

Other: Visualized mastoid air cells clear.
IMPRESSION: Atrophy with extensive small vessel disease throughout the centra
semiovale bilaterally. No intracranial mass, hemorrhage, or
extra-axial fluid collection. No evidence of acute infarct. There
are foci of arterial vascular calcification. There is mucosal
thickening in several ethmoid air cells.

## 2018-11-25 IMAGING — CR DG CHEST 1V PORT
1 series · 1 of 1 positions shown · non-contrast
Comparison: None.

CLINICAL DATA: Fever

EXAM:
PORTABLE CHEST 1 VIEW

[portable]
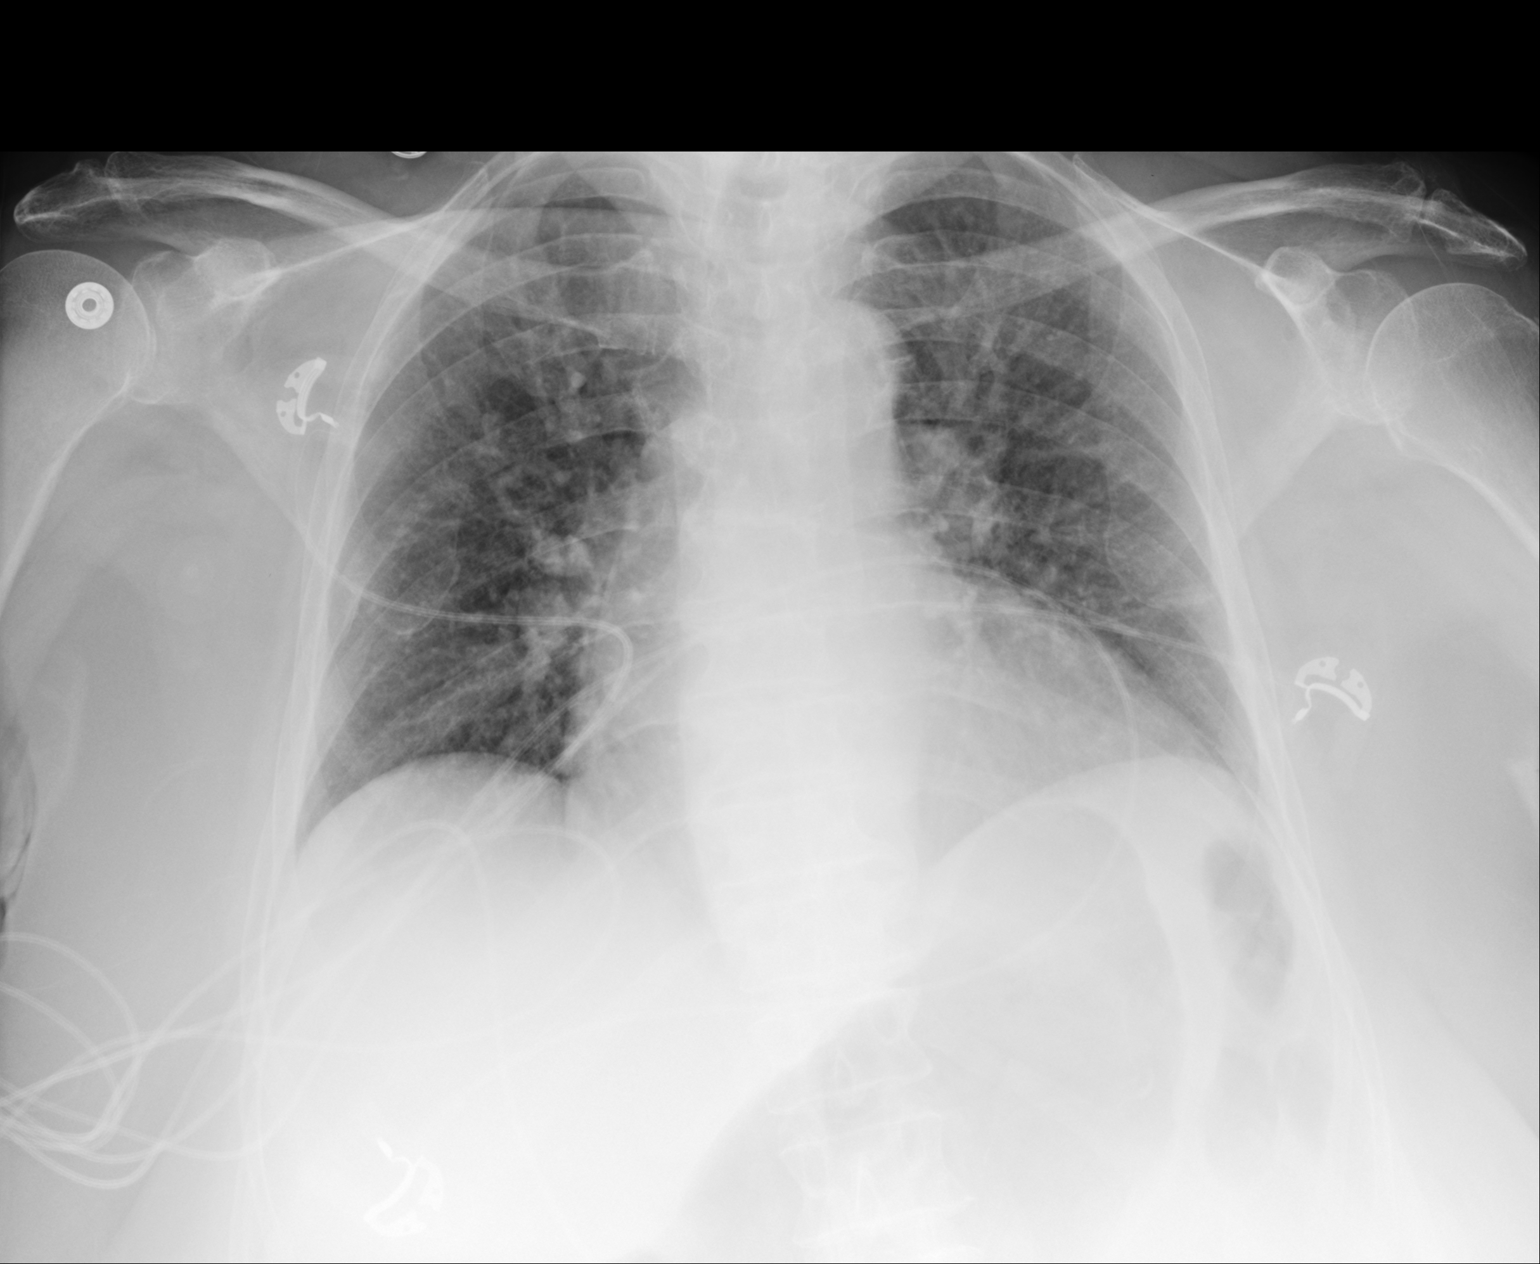

[1 of 1 positions shown; findings below may reference images not displayed]

FINDINGS: There is mild bilateral interstitial thickening. There is no focal
consolidation. There is no pleural effusion or pneumothorax. The
heart and mediastinal contours are unremarkable.

The osseous structures are unremarkable.
IMPRESSION: Bilateral interstitial thickening which may reflect interstitial
infection versus mild interstitial edema.

## 2018-11-26 IMAGING — CR DG CHEST 2V
2 series · 2 of 2 positions shown · non-contrast
Comparison: June 19, 2017

CLINICAL DATA: Stroke.  Concerns of aspiration or pneumonia.

EXAM:
CHEST  2 VIEW

[chest lat]
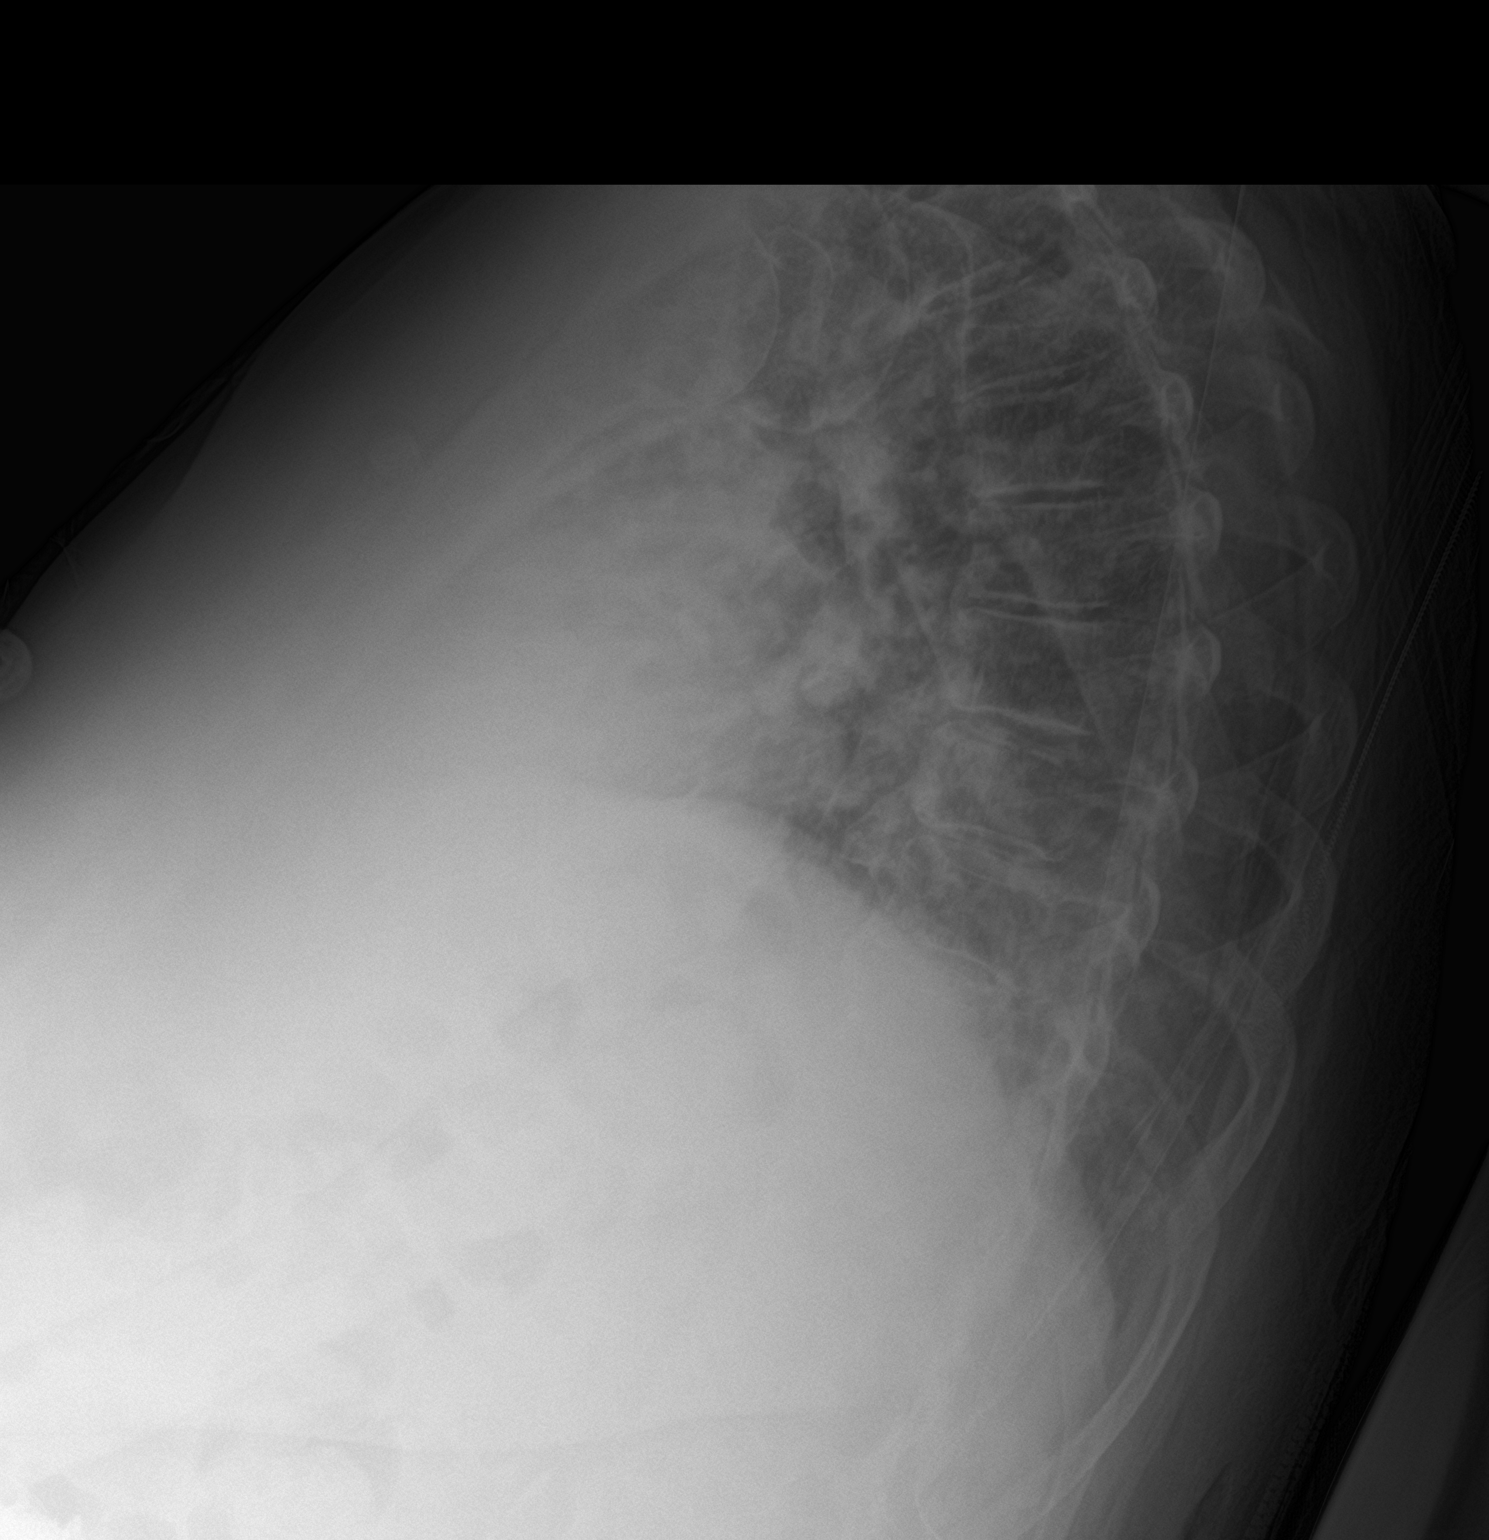

[chest ap]
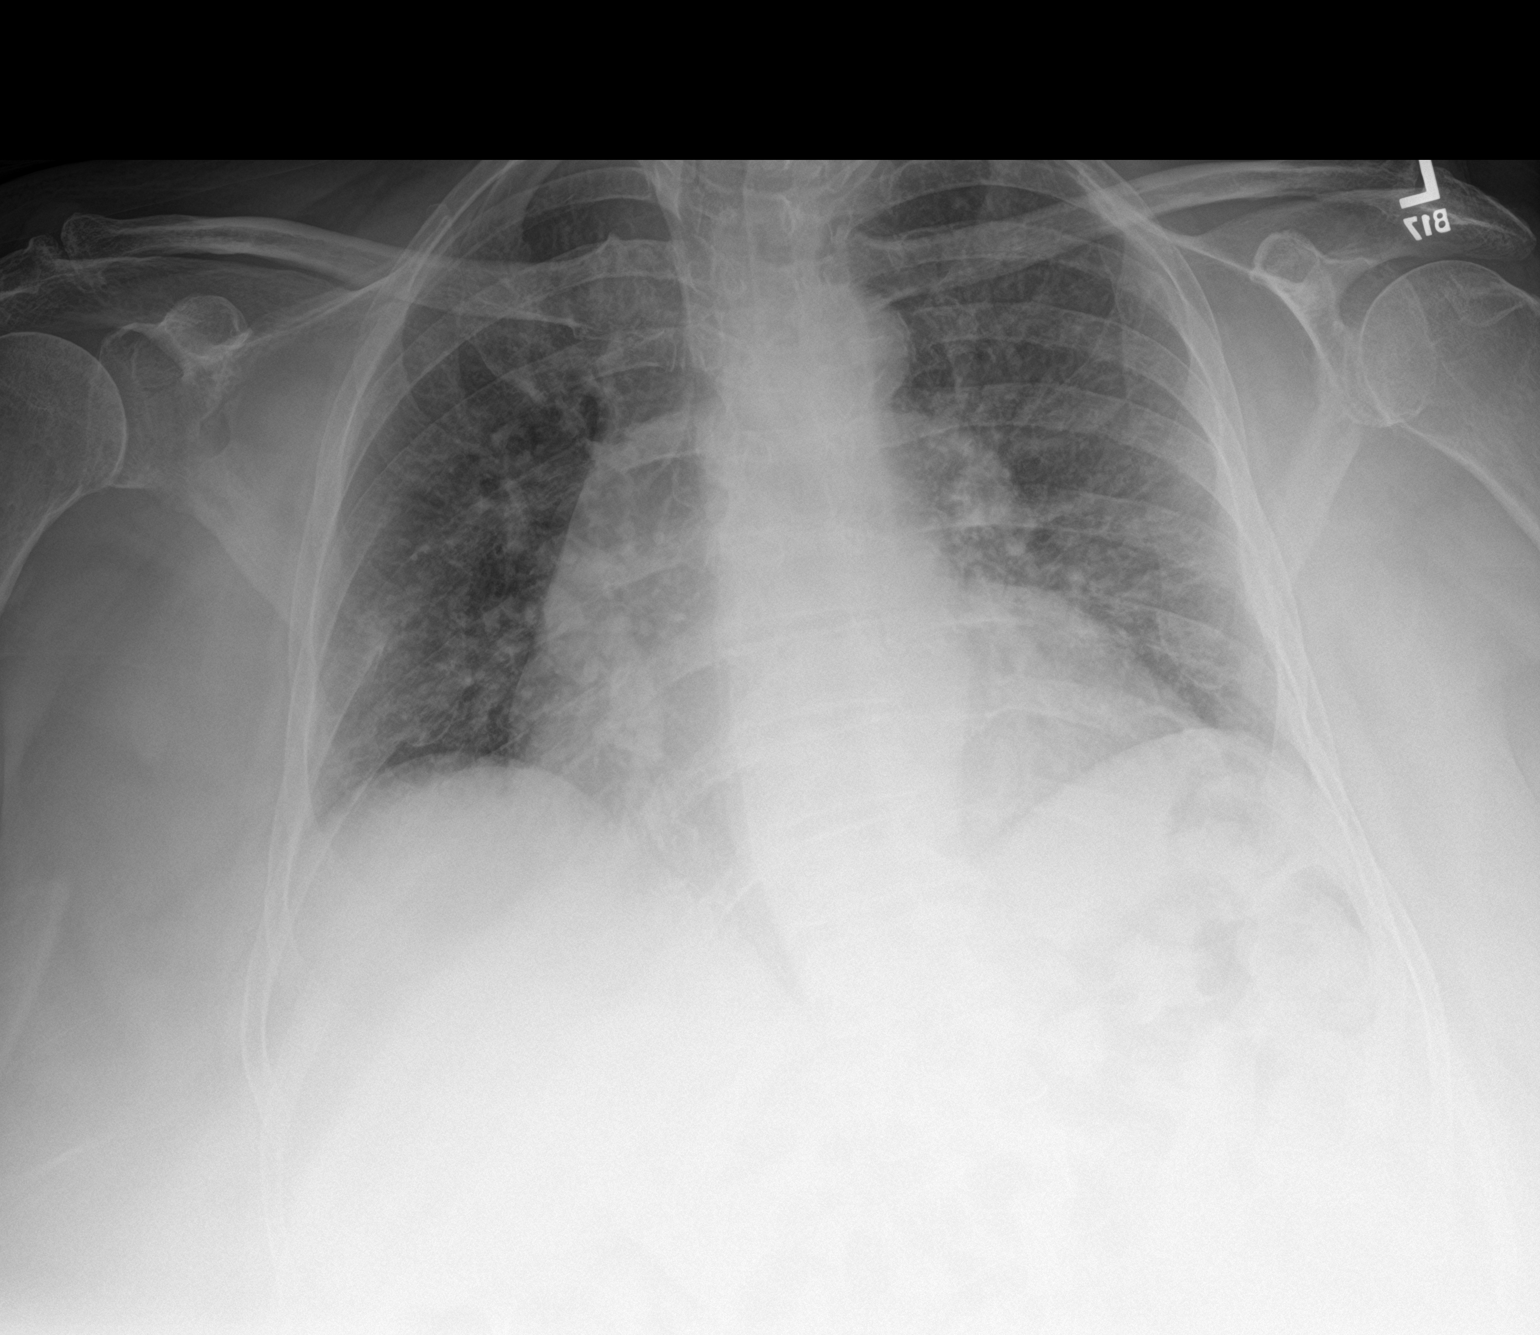

[2 of 2 positions shown; findings below may reference images not displayed]

FINDINGS: Stable cardiomegaly. The hila and mediastinum are unchanged. No
pulmonary nodules or masses. No focal infiltrates. Suggested
pulmonary venous congestion.
IMPRESSION: No focal infiltrate.  Pulmonary venous congestion.
